# Patient Record
Sex: Male | Born: 1950 | Race: White | Hispanic: No | Marital: Married
Health system: Southern US, Community
[De-identification: ages and names within clinical notes are randomized; demographics above are authoritative.]

## PROBLEM LIST (undated history)

## (undated) DIAGNOSIS — N189 Chronic kidney disease, unspecified: Secondary | ICD-10-CM

## (undated) DIAGNOSIS — R578 Other shock: Secondary | ICD-10-CM

## (undated) DIAGNOSIS — E039 Hypothyroidism, unspecified: Secondary | ICD-10-CM

## (undated) DIAGNOSIS — E669 Obesity, unspecified: Secondary | ICD-10-CM

## (undated) DIAGNOSIS — Z973 Presence of spectacles and contact lenses: Secondary | ICD-10-CM

## (undated) DIAGNOSIS — I4891 Unspecified atrial fibrillation: Secondary | ICD-10-CM

## (undated) DIAGNOSIS — R002 Palpitations: Secondary | ICD-10-CM

## (undated) DIAGNOSIS — Z7901 Long term (current) use of anticoagulants: Secondary | ICD-10-CM

## (undated) DIAGNOSIS — R9430 Abnormal result of cardiovascular function study, unspecified: Secondary | ICD-10-CM

## (undated) DIAGNOSIS — R238 Other skin changes: Secondary | ICD-10-CM

## (undated) DIAGNOSIS — Q8740 Marfan's syndrome, unspecified: Secondary | ICD-10-CM

## (undated) DIAGNOSIS — Z9289 Personal history of other medical treatment: Secondary | ICD-10-CM

## (undated) DIAGNOSIS — R55 Syncope and collapse: Secondary | ICD-10-CM

## (undated) DIAGNOSIS — E079 Disorder of thyroid, unspecified: Secondary | ICD-10-CM

## (undated) DIAGNOSIS — E119 Type 2 diabetes mellitus without complications: Secondary | ICD-10-CM

## (undated) DIAGNOSIS — K839 Disease of biliary tract, unspecified: Secondary | ICD-10-CM

## (undated) DIAGNOSIS — N433 Hydrocele, unspecified: Secondary | ICD-10-CM

## (undated) DIAGNOSIS — K469 Unspecified abdominal hernia without obstruction or gangrene: Secondary | ICD-10-CM

## (undated) DIAGNOSIS — I499 Cardiac arrhythmia, unspecified: Secondary | ICD-10-CM

## (undated) DIAGNOSIS — R233 Spontaneous ecchymoses: Secondary | ICD-10-CM

## (undated) DIAGNOSIS — R011 Cardiac murmur, unspecified: Secondary | ICD-10-CM

## (undated) DIAGNOSIS — T148XXA Other injury of unspecified body region, initial encounter: Secondary | ICD-10-CM

## (undated) HISTORY — PX: HX TONSILLECTOMY: SHX27

## (undated) HISTORY — PX: HX WISDOM TEETH EXTRACTION: SHX21

## (undated) HISTORY — PX: COLONOSCOPY: WVUENDOPRO10

## (undated) HISTORY — PX: HX HEART CATHETERIZATION: SHX148

## (undated) HISTORY — PX: HX COLONOSCOPY: 2100001147

## (undated) HISTORY — PX: HX TONSIL AND ADENOIDECTOMY: SHX28

---

## 1997-02-09 HISTORY — PX: HX HEART VALVE SURGERY: SHX40

## 1997-07-11 ENCOUNTER — Ambulatory Visit (HOSPITAL_COMMUNITY): Admission: RE | Admit: 1997-07-11 | Discharge: 1997-07-11 | Payer: Self-pay | Admitting: Interventional Cardiology

## 1999-10-31 ENCOUNTER — Encounter: Admission: RE | Admit: 1999-10-31 | Discharge: 1999-10-31 | Payer: Self-pay | Admitting: Cardiothoracic Surgery

## 1999-10-31 ENCOUNTER — Encounter: Payer: Self-pay | Admitting: Cardiothoracic Surgery

## 2004-06-26 ENCOUNTER — Ambulatory Visit: Payer: Self-pay | Admitting: Obstetrics and Gynecology

## 2004-11-19 ENCOUNTER — Ambulatory Visit: Payer: Self-pay

## 2004-12-08 ENCOUNTER — Ambulatory Visit: Payer: Self-pay

## 2006-09-09 ENCOUNTER — Ambulatory Visit: Payer: Self-pay

## 2006-09-20 ENCOUNTER — Ambulatory Visit: Payer: Self-pay

## 2006-11-05 ENCOUNTER — Ambulatory Visit: Payer: Self-pay

## 2006-12-02 ENCOUNTER — Ambulatory Visit: Payer: Self-pay

## 2007-01-17 ENCOUNTER — Ambulatory Visit: Payer: Self-pay

## 2007-04-21 ENCOUNTER — Ambulatory Visit: Payer: Self-pay

## 2007-06-17 ENCOUNTER — Ambulatory Visit: Payer: Self-pay

## 2007-06-21 ENCOUNTER — Ambulatory Visit: Payer: Self-pay

## 2007-08-24 ENCOUNTER — Ambulatory Visit: Payer: Self-pay

## 2007-09-06 ENCOUNTER — Ambulatory Visit: Payer: Self-pay

## 2007-09-16 ENCOUNTER — Ambulatory Visit

## 2007-09-27 ENCOUNTER — Ambulatory Visit: Payer: Self-pay

## 2007-09-29 ENCOUNTER — Ambulatory Visit: Payer: Self-pay

## 2007-10-27 ENCOUNTER — Ambulatory Visit: Payer: Self-pay

## 2007-11-24 ENCOUNTER — Ambulatory Visit: Payer: Self-pay

## 2007-12-06 ENCOUNTER — Ambulatory Visit: Payer: Self-pay

## 2007-12-29 ENCOUNTER — Ambulatory Visit: Payer: Self-pay

## 2008-05-03 ENCOUNTER — Ambulatory Visit: Admission: RE | Admit: 2008-05-03 | Payer: Self-pay | Source: Ambulatory Visit | Admitting: EXTERNAL

## 2008-05-22 ENCOUNTER — Ambulatory Visit: Admission: RE | Admit: 2008-05-22 | Payer: Self-pay | Source: Ambulatory Visit | Admitting: EXTERNAL

## 2008-07-11 ENCOUNTER — Ambulatory Visit: Admission: RE | Admit: 2008-07-11 | Payer: Self-pay | Source: Ambulatory Visit | Admitting: EXTERNAL

## 2008-08-28 ENCOUNTER — Ambulatory Visit: Admission: RE | Admit: 2008-08-28 | Payer: Self-pay | Source: Ambulatory Visit

## 2008-10-31 ENCOUNTER — Ambulatory Visit: Admission: RE | Admit: 2008-10-31 | Payer: Self-pay | Source: Ambulatory Visit

## 2008-11-29 ENCOUNTER — Encounter (INDEPENDENT_AMBULATORY_CARE_PROVIDER_SITE_OTHER): Payer: Self-pay | Admitting: "Endocrinology

## 2008-12-18 ENCOUNTER — Encounter (INDEPENDENT_AMBULATORY_CARE_PROVIDER_SITE_OTHER): Payer: No Typology Code available for payment source | Admitting: "Endocrinology

## 2008-12-31 ENCOUNTER — Encounter (INDEPENDENT_AMBULATORY_CARE_PROVIDER_SITE_OTHER): Payer: Self-pay

## 2009-01-07 ENCOUNTER — Encounter (INDEPENDENT_AMBULATORY_CARE_PROVIDER_SITE_OTHER): Payer: Self-pay

## 2009-03-20 ENCOUNTER — Encounter (INDEPENDENT_AMBULATORY_CARE_PROVIDER_SITE_OTHER): Payer: No Typology Code available for payment source

## 2009-03-25 ENCOUNTER — Ambulatory Visit: Admission: RE | Admit: 2009-03-25 | Payer: Self-pay | Source: Ambulatory Visit

## 2009-08-27 ENCOUNTER — Ambulatory Visit
Admission: RE | Admit: 2009-08-27 | Disposition: A | Discharge status: Home / Self Care | Payer: Self-pay | Source: Ambulatory Visit

## 2010-03-02 ENCOUNTER — Encounter: Payer: Self-pay | Admitting: Endocrinology

## 2010-07-15 ENCOUNTER — Ambulatory Visit: Admission: RE | Admit: 2010-07-15 | Discharge: 2010-07-15 | Payer: Self-pay | Source: Ambulatory Visit

## 2010-07-29 ENCOUNTER — Ambulatory Visit (INDEPENDENT_AMBULATORY_CARE_PROVIDER_SITE_OTHER): Payer: No Typology Code available for payment source | Admitting: UHP UROLOGY

## 2010-07-29 ENCOUNTER — Encounter (INDEPENDENT_AMBULATORY_CARE_PROVIDER_SITE_OTHER): Payer: Self-pay | Admitting: UHP UROLOGY

## 2010-07-29 VITALS — BP 109/67 | HR 80 | Temp 98.1°F | Resp 18 | Wt 285.0 lb

## 2010-07-29 NOTE — Progress Notes (Signed)
 Subjective:     Patient ID:  Kenneth Weeks is an 60 y.o. male     Chief Complaint:    Chief Complaint   Patient presents with   . Groin Pain     HPI  Mr. Kenneth Weeks is a very pleasant 61 year old Caucasian male who was referred by Dr. Dietrich for consultation regarding a hydrocele. The patient has noticed right-sided scrotal swelling for about one year. He denies any pain from this though does describe some discomfort associated with certain positions and movement. The patient did undergo an ultrasound of the scrotum which did not reveal any testicular tumors.    The patient does note a change in the size with some decrease early in the morning. He does take a diuretic for lower extremity edema. He denies any prior history of hernia repair. He denies this being associated with heavy lifting.    There is no family history of testicular cancer. The patient denies any significant urinary symptoms except for some nocturia.  He comes in today to discuss the possible management of this.    Past Medical History:  Marfan's syndrome  Hypothyroidism  Atrial fibrillation  Diabetes  Edema  Benign prostatic hyperplasia    Past Surgical History:  Heart surgery for valve replacement    Allergies:  No known drug allergies    Home Medications:  Current Outpatient Prescriptions   Medication Sig   . furosemide  (LASIX ) 40 mg Oral Tablet take 60 mg by mouth Twice daily.     SABRA warfarin (COUMADIN ) 7.5 mg Oral Tablet take 7.5 mg by mouth Once a day.     . Benazepril (LOTENSIN) 40 mg Oral Tablet take 40 mg by mouth Once a day.     . levothyroxine  (SYNTHROID ) 200 mcg Oral Tablet take 200 mcg by mouth Once a day.     . doxazosin (CARDURA) 8 mg Oral Tablet take 8 mg by mouth Once a day.     . metoprolol  succinate (TOPROL -XL) 100 mg Oral Tablet Sustained Release 24 hr take 150 mg by mouth Once a day.     . Metformin (GLUCOPHAGE) 1,000 mg Oral Tablet take 1,000 mg by mouth Twice daily with food.       Social History:  Denies tobacco  use.  Positive for social alcohol use.  Denies drug use.    Family History:  No prostate cancer  No kidney cancer  No bladder cancer  No testicular cancer    Review of Systems   Constitutional: Negative for fever, chills, weight loss and malaise/fatigue.   HENT: Negative for ear pain, sore throat and neck pain.    Eyes: Negative for blurred vision, double vision and pain.   Respiratory: Negative for cough, shortness of breath and wheezing.    Cardiovascular: Positive for palpitations. Negative for chest pain.   Gastrointestinal: Negative for nausea, vomiting, abdominal pain, diarrhea, constipation and blood in stool.   Genitourinary: Negative for dysuria, urgency, frequency, hematuria and flank pain.        Nocturia.   Musculoskeletal: Positive for back pain. Negative for myalgias and joint pain.   Skin: Negative for itching and rash.   Neurological: Negative for dizziness, tingling, tremors, weakness and headaches.   Endo/Heme/Allergies: Positive for polydipsia. Negative for environmental allergies. Does not bruise/bleed easily.   Psychiatric/Behavioral: Negative for depression. The patient is not nervous/anxious.      Objective:   Blood pressure 109/67, pulse 80, temperature 36.7 C (98.1 F), temperature source Tympanic, resp. rate 18,  weight 129.275 kg (285 lb).  Physical Exam   Constitutional: He is well-developed, well-nourished, and in no distress. No distress.   HENT:   Head: Normocephalic and atraumatic.   Eyes: Conjunctivae and EOM are normal. No scleral icterus.   Neck: Normal range of motion. No tracheal deviation present.   Cardiovascular: Normal rate.    Pulmonary/Chest: Effort normal. No respiratory distress.   Abdominal: Soft. He exhibits no distension. There is no tenderness.   Genitourinary: Penis normal.        Phallus normal. Normal meatus.  Nontender.  No plaques.  Left testicle descended. No masses.  Unable to assess right testicle secondary to large hydrocele.    Scrotal skin normal.      Musculoskeletal: Normal range of motion. He exhibits edema.   Neurological: He is alert. Gait normal.   Skin: Skin is warm. He is not diaphoretic. No pallor.   Psychiatric: Mood and affect normal.     Ortho/Musculoskeletal:   Normal range of motion. He exhibits edema.     TESTICULAR ULTRASOUND:  07/15/10:  Both testicles are visualized.  The right side measures 4.9 x 2.4 x 2.6 cm.  The left side measures 4.2 x 2.6 x 2.5 cm.  There is a prominent hydrocele on the right side measuring 8 x 7.2 x 5 cm.  A moderate sized hydrocele noted on the left side measuring 4.5 x 2.4 x 1.6 cm.  There is an epididymal head cyst measuring 1.2 cm noted on the left side.  Good color flow noted within the testicular parenchyma bilaterally.  No evidence of a focal mass.    IMPRESSION:    1. Moderate amount of bilateral hydrocele with the right side appearing more prominent than the left as discussed above.   2.  1.2 cm epididymal head cyst.   3.  No evidence of a testicular mass or torsion.      Assessment & Plan:     603.9 Hydrocele    I reviewed the testicular ultrasound with Kenneth Weeks that demonstrates right hydrocele. I discussed that active surveillance is an option if he is not particularly bothered with this. I discussed with him that definitive treatment would involve a hydrocelectomy through a scrotal incision. I reviewed with him the details of this procedure and discussed with him that this would be an outpatient procedure. The risks would be bleeding, infection, hematoma, and recurrence. I told him that there would be swelling after the procedure which may take upwards of 1 to 2 months to resolve.   I discussed with the patient that aspiration is not typically done for hydroceles. This would potentially cause infection and a sterile area and this was likely recur.  All of his questions were answered. He will let me know what his preference will be.  He will speak with his wife and let us  know if he desires surgery. I gave  him printed information about hydroceles and also gave him a copy of the ultrasound report. All of his questions were answered today.

## 2012-07-01 NOTE — Procedures (Signed)
[facility]                               CARDIOVASCULAR LABORATORY       TEE guided Cardioversion              NAME: Brian Young, Brian Young                                  ROOM#: O/--CP              DOB:  December 25, 1950                                            AGE/SEX: 59/M       ZO#:[id number]       [id number]       _________________________________________________________________________              TEST DATE: 08-27-2009                      REQ PHYS: Rushton Early Laverle Patter, MD       ORDER#:                                    INT PHYS: Dezaree Tracey A Abbe Bula, MD       HGT: 76 inches                                         WEIGHT: 288 pounds       TECH: RT                                                      BP: 151/104       _________________________________________________________________________              TIME OF TEST:       09:59 AM              INDICATION:       Atrial fibrillation.              INTERPRETATION:       Presenting rhythm was atrial fibrillation with a controlled ventricular       response rate.              The patient's prothrombin times had been generally therapeutic with a       single subtherapeutic INR and hence the decision to proceed with TEE       guidance was undertaken.              PROCEDURE:       Posterior oropharyngeal anesthesia was accomplished using several sprays       of Cetacaine topical anesthetic and viscus Lidocaine.  Thereafter the       patient was placed in the left lateral decubitus position and conscious       sedation was provided.  A total of 3.5 mg of  Versed and 50 mcg of       Fentanyl was administered intravenously.  The TEE probe was placed into       the posterior pharynx and directed downward.  At the level of the       cricopharyngeus the patient was directed to swallow and the probe passed       easily into the esophagus.  A complete TEE in the mid esophageal and       transgastric views was undertaken.               FINDINGS:       1.  Left ventricle:  Normal in chamber size and low normal to           mildly depressed in systolic function.  The estimated ejection           fraction is 45-50%.       2.  Right ventricle:  Normal in size and systolic function.       3.  Left atrium:  The left atrium is mildly enlarged.  There was           no evidence for organized thrombus seen in either the left atrial           main body or the left atrial appendage.       4.  Right atrium:  Grossly normal.       5.  Interatrial septum:  The interatrial septum is mobile but does           not demonstrate aneurysm.  There is no evidence for PFO by color flow           doppler or 2-dimensional imaging.       6.  Aortic valve:  There is a mechanical prosthesis in the aortic           position with appears well seated.  There is no aortic insufficiency.           Valve leaflet mobility appears to be normal.       7.  Mitral valve:  The mitral valve leaflets are grossly normal.           There is 1-2+ central mitral regurgitation by color flow doppler.              8.  Tricuspid valve: Structurally normal with 1+ tricuspid           regurgitation.       9.  Pulmonic valve:  Poorly seen.                                Following completion of the TEE, a single synchronized biphasic counter           shock atoner joules is delivered with restoration of sinus rhythm.  No           obvious complications were noted.                            **PRELIMINARY REPORT UNLESS SIGNED**       SEE DOCUMENT IMAGING SYSTEM FOR FINAL REPORT                                 ________________________________  Miley Blanchett Laverle Patter, MD                                            ID:   [id number]       DocType:   [id number]       \:   AR       /:   654       DD:  08/27/2009 10:20:39       DT:  08/27/2009 17:34:02       JOB: [id number]       CC:  Chriss Czar, DO [phone])       >  Authenticated by Pola Corn., MD On 09/01/2009 01:46:23 PM

## 2013-03-17 LAB — ECG 12-LEAD
P Wave Axis: 67 deg
P Wave Duration: 154 ms
P-R Interval: 178 ms
Patient Age: 59 years
Q-T Dispersion: 46 ms
Q-T Interval(Corrected): 425 ms
Q-T Interval: 404 ms
QRS Axis: 19 deg
QRS Duration: 90 ms
T Axis: 36 deg
Ventricular Rate: 72 /min

## 2013-09-26 ENCOUNTER — Ambulatory Visit (INDEPENDENT_AMBULATORY_CARE_PROVIDER_SITE_OTHER): Payer: No Typology Code available for payment source | Admitting: Urology

## 2013-10-10 ENCOUNTER — Encounter (INDEPENDENT_AMBULATORY_CARE_PROVIDER_SITE_OTHER): Payer: Self-pay | Admitting: Urology

## 2013-10-10 ENCOUNTER — Ambulatory Visit (INDEPENDENT_AMBULATORY_CARE_PROVIDER_SITE_OTHER): Payer: Medicare Other | Admitting: Urology

## 2013-10-10 VITALS — BP 150/99 | HR 97 | Temp 97.9°F | Ht 76.0 in | Wt 311.8 lb

## 2013-10-10 DIAGNOSIS — N432 Other hydrocele: Principal | ICD-10-CM

## 2013-10-11 NOTE — Progress Notes (Signed)
[facility]                                                                              PROGRESS NOTE    PATIENT NAME: Kenneth Weeks, Kenneth Weeks [facility] VOZDGU:[id number]  DATE OF SERVICE:10/10/2013  DATE OF BIRTH: 20-Oct-1950    Kenneth Weeks is a 63 year old male presenting for evaluation of hydrocele.    HISTORY OF PRESENT ILLNESS:  The patient has a history of Marfan syndrome and associated aortic valvular disease.  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  He saw Dr. Bayard Hugger a few years ago for bothersome hydrocele.  He elected to observe it, but he continues to have increasing troubles in association with this.  He wants to have something done about this and as surgery is prohibitively risky because of his need for anticoagulation he is requesting that he have hydrocele aspiration performed.    PAST MEDICAL HISTORY:  As above.    PAST SURGICAL HISTORY:  Aortic valve replacement in 1999.    MEDICATIONS:  Warfarin, benazepril, Synthroid and Lasix.    SOCIAL HISTORY:  Negative for tobacco or drug use.    REVIEW OF SYSTEMS:  Positive for heart palpitations and excessive thirst.  Otherwise, 12-point review of systems negative.    PHYSICAL EXAMINATION:  The patient has a very large right-sided scrotal hydrocele.  No hernias appreciated.  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    ASSESSMENT AND PLAN:  Patient with very symptomatic and enlarging hydrocele.  Normally I do not recommend simple aspiration because of risk of recurrence, bleeding, infection and need for additional treatment.  I think it is a reasonable approach for this particular patient, however, as he cannot come off Coumadin and therefore, it would be prohibitively risky for bleeding complications with formal hydrocelectomy.   The patient wants to think all these things over and I clearly explained that in general, this is not a standard approach to deal with hydroceles.  If he decides to pursue hydrocele aspiration we will see him back in the near future to have this done in the office.      Kathline Magic, MD      [id number]; D: 10/10/2013 14:53:07 T: 10/11/2013 08:19:45

## 2013-10-12 ENCOUNTER — Ambulatory Visit
Admission: RE | Admit: 2013-10-12 | Discharge: 2013-10-12 | Disposition: A | Discharge status: Home / Self Care | Payer: Medicare Other | Source: Ambulatory Visit | Attending: INTERNAL MEDICINE | Admitting: INTERNAL MEDICINE

## 2013-10-12 DIAGNOSIS — Z7901 Long term (current) use of anticoagulants: Secondary | ICD-10-CM | POA: Insufficient documentation

## 2013-10-12 LAB — PROTHROMBIN TIME, THERAPEUTIC
INR: 5.95
PROTHROMBIN TIME: 66.7 s (ref 9.8–11.0)

## 2013-10-17 ENCOUNTER — Ambulatory Visit
Admission: RE | Admit: 2013-10-17 | Discharge: 2013-10-17 | Disposition: A | Discharge status: Home / Self Care | Payer: Medicare Other | Source: Ambulatory Visit | Attending: INTERNAL MEDICINE | Admitting: INTERNAL MEDICINE

## 2013-10-17 DIAGNOSIS — I749 Embolism and thrombosis of unspecified artery: Secondary | ICD-10-CM | POA: Insufficient documentation

## 2013-10-17 LAB — PROTHROMBIN TIME, THERAPEUTIC
INR: 2.47
PROTHROMBIN TIME: 26.7 s (ref 9.8–11.0)

## 2013-10-18 ENCOUNTER — Ambulatory Visit (INDEPENDENT_AMBULATORY_CARE_PROVIDER_SITE_OTHER): Payer: Medicare Other | Admitting: Urology

## 2013-10-18 ENCOUNTER — Encounter (INDEPENDENT_AMBULATORY_CARE_PROVIDER_SITE_OTHER): Payer: Self-pay | Admitting: Urology

## 2013-10-18 VITALS — BP 140/90 | HR 80 | Temp 97.6°F | Ht 75.5 in | Wt 308.2 lb

## 2013-10-18 DIAGNOSIS — N433 Hydrocele, unspecified: Principal | ICD-10-CM

## 2013-10-19 NOTE — Procedures (Signed)
[facility]                                          PROCEDURE NOTE    PATIENT NAME: Kenneth Weeks, Kenneth Weeks QBVQXI:[id number]  DATE OF SERVICE:10/18/2013  DATE OF BIRTH: 05/11/1950    NAME OF PROCEDURE:  Aspiration of hydrocele.     PREOPERATIVE DIAGNOSIS:  Symptomatic hydrocele   POSTOPERATIVE DIAGNOSIS:  same    INDICATIONS FOR PROCEDURE:  The patient has a history of a St. Jude mechanical aortic valve which requires mandatory anticoagulation for Coumadin.  He has increasingly bothersome right-sided hydrocele and presents today to have it aspirated.    DESCRIPTION OF PROCEDURE:  Informed consent was obtained.  The patient was taken to the procedure room and placed supine on the procedure room table, prepped and draped in standard sterile manner.  Local anesthesia with 1% lidocaine was administered overlying the right scrotal skin down towards the hydrocele sac.  Once this was done, an 18-gauge needle was inserted through the scrotal skin and into the hydrocele sac and approximately 200 mL of straw-colored fluid was aspirated out of the hydrocele sac.    The patient was informed prior to the procedure that there is a significant chance for recurrence.  He was also aware of the risks of bleeding or infection.  We will see him back in a few weeks to monitor his response to this.  We did also discuss that if he has a good response to this, we could consider aspiration along with injection of sclerosing agents in the future.      Kathline Magic, MD      LK/JZ/[id number]; D: 10/18/2013 16:35:56 T: 10/19/2013 11:17:33

## 2013-10-26 NOTE — Progress Notes (Signed)
[facility]  [address] [place]  [place], [place] [address]  Phone [phone]  Fax [phone]     CC/HPI: See dictated note.    Greater than 50% of a 30 minute visit was spent in discussion of prognosis and treatment options.    Assessment/Plan:    ICD-9-CM   1. Other hydrocele 603.8       No orders of the defined types were placed in this encounter.         Past Medical History:  No past medical history on file.  No past medical history pertinent negatives on file.            Past Surgical History:  No past surgical history on file.  No past surgical history pertinent negatives on file.        Allergies:  No Known Allergies    Current Medications:  Current Outpatient Prescriptions   Medication Sig    Benazepril (LOTENSIN) 40 mg Oral Tablet take 40 mg by mouth Once a day.      furosemide (LASIX) 40 mg Oral Tablet take 60 mg by mouth Twice daily.      levothyroxine (SYNTHROID) 200 mcg Oral Tablet take 200 mcg by mouth Once a day.      warfarin (COUMADIN) 7.5 mg Oral Tablet Take 7.5 mg by mouth Once a day 7.5 mg Monday- Friday and 10 mg Saturday and Sunday       Social History:  History   Substance Use Topics    Smoking status: Never Smoker     Smokeless tobacco: Never Used    Alcohol Use: No       Family History:  No family history on file.        Physical examination:  BP 150/99 mmHg   Pulse 97   Temp(Src) 36.6 C (97.9 F) (Tympanic)   Ht 1.93 m (6\' 4" )   Wt 141.432 kg (311 lb 12.8 oz)   BMI 37.97 kg/m2  See dictated note    Labs:  Pertinent labs reviewed.    Imaging studies:  Pertinent imaging studies reviewed.        Marthe Patch, MD 10/26/2013, 13:12

## 2013-10-27 ENCOUNTER — Other Ambulatory Visit (INDEPENDENT_AMBULATORY_CARE_PROVIDER_SITE_OTHER): Payer: Self-pay | Admitting: INTERNAL MEDICINE

## 2013-11-01 ENCOUNTER — Encounter (INDEPENDENT_AMBULATORY_CARE_PROVIDER_SITE_OTHER): Payer: Medicare Other | Admitting: Urology

## 2013-11-01 ENCOUNTER — Other Ambulatory Visit: Payer: Medicare Other

## 2013-11-08 ENCOUNTER — Ambulatory Visit
Admission: RE | Admit: 2013-11-08 | Discharge: 2013-11-08 | Disposition: A | Discharge status: Home / Self Care | Payer: Medicare Other | Source: Ambulatory Visit | Attending: INTERNAL MEDICINE | Admitting: INTERNAL MEDICINE

## 2013-11-08 DIAGNOSIS — Z7901 Long term (current) use of anticoagulants: Secondary | ICD-10-CM | POA: Insufficient documentation

## 2013-11-08 LAB — PROTHROMBIN TIME, THERAPEUTIC
INR: 3.88
INR: 3.88
PROTHROMBIN TIME: 43.3 s — ABNORMAL HIGH (ref 9.4–12.5)

## 2013-11-13 ENCOUNTER — Telehealth (INDEPENDENT_AMBULATORY_CARE_PROVIDER_SITE_OTHER): Payer: Self-pay

## 2013-11-13 MED ORDER — FUROSEMIDE 20 MG TABLET
60.0000 mg | ORAL_TABLET | Freq: Two times a day (BID) | ORAL | Status: DC
Start: 2013-11-13 — End: 2013-11-13

## 2013-11-13 MED ORDER — FUROSEMIDE 20 MG TABLET
60.0000 mg | ORAL_TABLET | Freq: Two times a day (BID) | ORAL | Status: DC
Start: 2013-11-13 — End: 2014-05-03

## 2013-11-13 NOTE — Telephone Encounter (Signed)
This has been done for #180 doses     Sorry for the hassle     --Thurmond Butts

## 2013-11-13 NOTE — Telephone Encounter (Signed)
I'm sorry but pt/pharmacy called back again requesting #180 not #90 on Rx, can you please resend again?

## 2013-11-13 NOTE — Addendum Note (Signed)
Addended by: Shane Crutch on: 11/13/2013 04:48 PM     Modules accepted: Orders

## 2013-11-13 NOTE — Telephone Encounter (Signed)
Pt. Called about an error with his medication; he takes furoseimide 20 mg. 6 pills q.d.  The Rx phoned into [facility] was for #90, one q.d.  He did not take the medication.  Could we please call with the correct dosage.

## 2013-11-13 NOTE — Telephone Encounter (Signed)
This had been addressed and proper Rx has been sent to [facility]     Thanks.   Sorry for the hassle     Thurmond Butts

## 2013-12-01 ENCOUNTER — Telehealth (INDEPENDENT_AMBULATORY_CARE_PROVIDER_SITE_OTHER): Payer: Self-pay | Admitting: INTERNAL MEDICINE

## 2013-12-01 DIAGNOSIS — Z7901 Long term (current) use of anticoagulants: Secondary | ICD-10-CM

## 2013-12-01 NOTE — Telephone Encounter (Signed)
Patient has [facility] Wed 12/06/13. Please send order.  Thanks  [employer]  12/01/2013, 15:11

## 2013-12-06 ENCOUNTER — Other Ambulatory Visit: Payer: Medicare Other

## 2013-12-12 ENCOUNTER — Other Ambulatory Visit: Payer: Medicare Other

## 2013-12-21 ENCOUNTER — Ambulatory Visit
Admission: RE | Admit: 2013-12-21 | Discharge: 2013-12-21 | Disposition: A | Discharge status: Home / Self Care | Payer: Medicare Other | Source: Ambulatory Visit | Attending: INTERNAL MEDICINE | Admitting: INTERNAL MEDICINE

## 2013-12-21 ENCOUNTER — Ambulatory Visit (INDEPENDENT_AMBULATORY_CARE_PROVIDER_SITE_OTHER): Payer: Self-pay | Admitting: INTERNAL MEDICINE

## 2013-12-21 DIAGNOSIS — Z5181 Encounter for therapeutic drug level monitoring: Secondary | ICD-10-CM | POA: Insufficient documentation

## 2013-12-21 DIAGNOSIS — Z7901 Long term (current) use of anticoagulants: Secondary | ICD-10-CM | POA: Insufficient documentation

## 2013-12-21 LAB — PT/INR
INR: 5.32
PROTHROMBIN TIME: 59.5 s (ref 9.4–12.5)

## 2013-12-21 NOTE — Progress Notes (Signed)
Patient notified of lab results.   Lambert Mody, MA  12/21/2013, 14:30

## 2013-12-21 NOTE — Progress Notes (Signed)
12/21/13 1300   [facility]/INR   Date INR drawn 12/21/13   INR 5.3   New weekly dosgae Hold warfarin today and then resume normal dose after  that   Comments Check INR in one month   Anticoag Indication Other (see below)  (Mechanical heart valve)   Goal INR 2.5-3.5

## 2014-01-02 ENCOUNTER — Telehealth (INDEPENDENT_AMBULATORY_CARE_PROVIDER_SITE_OTHER): Payer: Self-pay

## 2014-01-02 DIAGNOSIS — Z7901 Long term (current) use of anticoagulants: Secondary | ICD-10-CM

## 2014-01-02 NOTE — Telephone Encounter (Signed)
Patient is down for labs (doesn't say what) in Dec. Please send order.  Thanks  [employer]  01/02/2014, 16:50

## 2014-01-17 ENCOUNTER — Ambulatory Visit (INDEPENDENT_AMBULATORY_CARE_PROVIDER_SITE_OTHER): Payer: Self-pay | Admitting: INTERNAL MEDICINE

## 2014-01-17 ENCOUNTER — Ambulatory Visit
Admission: RE | Admit: 2014-01-17 | Discharge: 2014-01-17 | Disposition: A | Discharge status: Home / Self Care | Payer: Medicare Other | Source: Ambulatory Visit | Attending: INTERNAL MEDICINE | Admitting: INTERNAL MEDICINE

## 2014-01-17 ENCOUNTER — Telehealth (INDEPENDENT_AMBULATORY_CARE_PROVIDER_SITE_OTHER): Payer: Self-pay | Admitting: INTERNAL MEDICINE

## 2014-01-17 DIAGNOSIS — Z7901 Long term (current) use of anticoagulants: Secondary | ICD-10-CM | POA: Insufficient documentation

## 2014-01-17 DIAGNOSIS — Z5181 Encounter for therapeutic drug level monitoring: Secondary | ICD-10-CM | POA: Insufficient documentation

## 2014-01-17 LAB — PT/INR
INR: 6.26
PROTHROMBIN TIME: 70.1 s (ref 9.4–12.5)

## 2014-01-17 NOTE — Progress Notes (Signed)
Pt aware of results. States understanding.  [place], [place]  01/17/2014, 15:58

## 2014-01-17 NOTE — Progress Notes (Signed)
01/17/14 1300   [facility]/INR   Date INR drawn 01/17/14   INR 6.3   Comments Hold the warfarin for two days and then resume. Please normal. Check in 2 weeks   Initials RTM   Anticoag Indication (Mechanical Aortic valve)   Goal INR 2.5-3.5

## 2014-01-17 NOTE — Telephone Encounter (Signed)
[facility] with [facility].   PT: 70.1  INR: 6.26

## 2014-02-15 ENCOUNTER — Ambulatory Visit
Admission: RE | Admit: 2014-02-15 | Discharge: 2014-02-15 | Disposition: A | Discharge status: Home / Self Care | Payer: Medicare Other | Source: Ambulatory Visit | Attending: INTERNAL MEDICINE | Admitting: INTERNAL MEDICINE

## 2014-02-15 ENCOUNTER — Ambulatory Visit (INDEPENDENT_AMBULATORY_CARE_PROVIDER_SITE_OTHER): Payer: Self-pay | Admitting: INTERNAL MEDICINE

## 2014-02-15 DIAGNOSIS — Z7901 Long term (current) use of anticoagulants: Secondary | ICD-10-CM | POA: Insufficient documentation

## 2014-02-15 LAB — PT/INR
INR: 4.64
PROTHROMBIN TIME: 51.8 s (ref 9.4–12.5)

## 2014-02-15 NOTE — Progress Notes (Signed)
Patient notified of lab results.   Lambert Mody, MA  02/15/2014, 14:55

## 2014-02-15 NOTE — Progress Notes (Signed)
02/15/14 1300   [facility]/INR   Date INR drawn 02/15/14   INR 4.6   Comments Just a touch high. Hold one day and then resume normal. Check in one month   Initials RTM   Anticoag Indication (Mechanical heart valve)   Goal INR 2.5-3.5

## 2014-02-27 NOTE — Procedures (Signed)
See dictated note.

## 2014-03-14 ENCOUNTER — Ambulatory Visit
Admission: RE | Admit: 2014-03-14 | Discharge: 2014-03-14 | Disposition: A | Discharge status: Home / Self Care | Payer: Medicare Other | Source: Ambulatory Visit | Attending: INTERNAL MEDICINE | Admitting: INTERNAL MEDICINE

## 2014-03-14 ENCOUNTER — Ambulatory Visit (INDEPENDENT_AMBULATORY_CARE_PROVIDER_SITE_OTHER): Payer: Self-pay | Admitting: INTERNAL MEDICINE

## 2014-03-14 DIAGNOSIS — Z7901 Long term (current) use of anticoagulants: Secondary | ICD-10-CM | POA: Insufficient documentation

## 2014-03-14 LAB — PT/INR
INR: 4.76
PROTHROMBIN TIME: 52.7 s — ABNORMAL HIGH (ref 9.4–12.5)

## 2014-03-14 NOTE — Progress Notes (Signed)
03/14/14 1200   [facility]/INR   Date INR drawn 03/14/14   INR 4.6   Comments Hold warfarin for a day, then resume normal   Initials RTM   Anticoag Indication (Heart valve)   Goal INR 2.5-3.5

## 2014-03-15 ENCOUNTER — Other Ambulatory Visit: Payer: Medicare Other

## 2014-03-30 ENCOUNTER — Encounter (INDEPENDENT_AMBULATORY_CARE_PROVIDER_SITE_OTHER): Payer: Self-pay | Admitting: Urology

## 2014-03-30 ENCOUNTER — Ambulatory Visit (INDEPENDENT_AMBULATORY_CARE_PROVIDER_SITE_OTHER): Payer: Medicare Other | Admitting: Urology

## 2014-03-30 VITALS — BP 131/84 | HR 93 | Temp 96.8°F | Ht 75.0 in | Wt 305.2 lb

## 2014-03-30 DIAGNOSIS — N433 Hydrocele, unspecified: Secondary | ICD-10-CM

## 2014-03-31 NOTE — Progress Notes (Signed)
[facility]                                                                              PROGRESS NOTE    PATIENT NAME: Kenneth Weeks, Kenneth Weeks  HOSPITAL NUMBER:[id number]  DATE OF SERVICE:03/30/2014  DATE OF BIRTH: Oct 11, 1950    This patient is a 64 year old male presenting for followup of hydrocele.    SUBJECTIVE:  The patient is a 64 year old male with a history of Marfan syndrome and aortic valve disease.  He had a St. Jude mechanical valve placed and requires lifelong anticoagulation for this.  He has a bothersome hydrocele that interferes with his ability to urinate.  A few months ago, I performed an aspiration on it.  He reports that he had a good symptomatic improvement, but it was short lived.  The hydrocele recurred around 1 month later.  The patient understood that this was expected natural history for hydrocele aspirations.  He is presenting today requesting repeat aspiration.  We also discussed some studies suggesting a beneficial role for aspiration followed by injection of sclerosing agents with antibiotics such as tetracycline or doxycycline.  We do not have these antibiotics available today but if the hydrocele continues to recur, we may consider injection of sclerosing agents in the future.    PROCEDURE NOTE:  Informed consent was obtained.  The patient was placed supine on the procedure room table, prepped and draped in the standard sterile manner.  2% lidocaine was injected in the anterior scrotal wall on the right side and then using an 18-gauge needle to puncture the hydrocele sac, I aspirated around 200 mL of straw-colored fluid.  The patient tolerated this well.  Care was taken not to injure the testicle or cord structures.      ASSESSMENT AND PLAN:  We will see him back as needed for ongoing management of his hydrocele.      Kathline Magic, MD      [id number]; D: 03/30/2014 12:38:11 T: 03/31/2014 40:81:44

## 2014-04-02 NOTE — Procedures (Signed)
See dictated note.

## 2014-04-12 ENCOUNTER — Encounter (INDEPENDENT_AMBULATORY_CARE_PROVIDER_SITE_OTHER): Payer: Self-pay | Admitting: INTERNAL MEDICINE

## 2014-04-12 ENCOUNTER — Ambulatory Visit (INDEPENDENT_AMBULATORY_CARE_PROVIDER_SITE_OTHER): Payer: Medicare Other | Admitting: INTERNAL MEDICINE

## 2014-04-12 ENCOUNTER — Ambulatory Visit
Admission: RE | Admit: 2014-04-12 | Discharge: 2014-04-12 | Disposition: A | Discharge status: Home / Self Care | Payer: Medicare Other | Source: Ambulatory Visit | Attending: INTERNAL MEDICINE | Admitting: INTERNAL MEDICINE

## 2014-04-12 VITALS — BP 130/100 | HR 96 | Ht 75.0 in | Wt 311.0 lb

## 2014-04-12 DIAGNOSIS — I4891 Unspecified atrial fibrillation: Secondary | ICD-10-CM | POA: Insufficient documentation

## 2014-04-12 DIAGNOSIS — Z952 Presence of prosthetic heart valve: Secondary | ICD-10-CM

## 2014-04-12 DIAGNOSIS — E039 Hypothyroidism, unspecified: Secondary | ICD-10-CM

## 2014-04-12 DIAGNOSIS — E669 Obesity, unspecified: Secondary | ICD-10-CM

## 2014-04-12 DIAGNOSIS — Z954 Presence of other heart-valve replacement: Secondary | ICD-10-CM

## 2014-04-12 LAB — PT/INR
INR: 3.58
PROTHROMBIN TIME: 39.9 s (ref 9.4–12.5)

## 2014-04-12 LAB — COMPREHENSIVE METABOLIC PROFILE - BMC/JMC ONLY
ALBUMIN/GLOBULIN RATIO: 1
ALBUMIN: 3.6 g/dL (ref 3.2–5.0)
ALKALINE PHOSPHATASE: 56 IU/L (ref 35–120)
ALT (SGPT): 16 IU/L (ref 0–63)
ANION GAP: 6 mmol/L (ref 3–11)
AST (SGOT): 20 IU/L (ref 0–45)
BILIRUBIN, TOTAL: 1.5 mg/dL — ABNORMAL HIGH (ref 0.0–1.3)
BUN: 11 mg/dL (ref 6–22)
CALCIUM: 8.7 mg/dL (ref 8.5–10.5)
CARBON DIOXIDE: 30 mmol/L (ref 22–32)
CHLORIDE: 99 mmol/L — ABNORMAL LOW (ref 101–111)
CREATININE: 1.02 mg/dL (ref 0.72–1.30)
ESTIMATED GLOMERULAR FILTRATION RATE: >60 mL/min (ref >60)
GLUCOSE: 140 mg/dL — ABNORMAL HIGH (ref 70–110)
POTASSIUM: 3.6 mmol/L (ref 3.5–5.0)
SODIUM: 135 mmol/L — ABNORMAL LOW (ref 136–145)
TOTAL PROTEIN: 7 g/dL (ref 6.0–8.0)

## 2014-04-12 LAB — TSH CASCADE,3RD GEN WITH REFLEX TO FT4: TSH CASCADE: 1.234 u[IU]/mL (ref 0.340–5.600)

## 2014-04-12 NOTE — Progress Notes (Signed)
[facility]   [address]   [place], [place] [address]  [phone]  [phone]    Chronic Problems Office Visit     SUBJECTIVE   Kenneth Weeks is a 64 y.o. male here for management of multiple medical issues that include warfarin management, history of CHF and other primary care medical problems.     This is a regular office visit for him.     He has been regularly getting his INR drawn here at the office.     He does not have any new medications to speak of.        Vision -- glasses; no loss of central or peripheral vision     Hearing -- no major changes in hearing     Sources of pain which are new -- same old aches and pains     Sleep quality --     Chewing/swallowing -- no problem chewing or swallowing     Contact with other providers -- none     Medications reviewed by me personally at this time:   Warfarin 7.49m daily    Synthroid 207m daily   Lasix 60 mg BID     Physical activities   He is not short of breath when he walks; no chest pain; no chest tightness; no swelling in hands or feet        Since our last visit together, has experienced the following healthcare interactions:     ER -- no visits   Urgent care visits -- nothing   Accidents with a car -- nothing of the sort   Injuries at home which resulted in injury -- no falls on the snow or the ice; no injuries at home   Medications from other providers -- nothing that is new   Surgeries or other procedures -- nothing of the sort       Changes in family history since we last met -- no new illnesses in family members that could affect       Changes in social history since we last met -- living in the same location       Medications reviewed    Current outpatient prescriptions:     furosemide (LASIX) 40 mg Oral Tablet, take 60 mg by mouth Twice daily.  , Disp: , Rfl:     levothyroxine (SYNTHROID) 200 mcg Oral Tablet, take 200 mcg by mouth Once a day.  , Disp: , Rfl:     warfarin (COUMADIN) 7.5 mg Oral Tablet, Take 7.5  mg by mouth Once a day 7.5 mg Monday- Friday and 10 mg Saturday and Sunday, Disp: , Rfl:       Review of Systems -     Constitutional: negative for fevers,  Eyes: no new blurry or double vision that is new   ENT: no acute hearing loss   Cardiovascular: no new chest pain or palpitations when he is walking on the [place]   Respiratory: no new shortness of breath   GI: no new vomiting or diarrhea   GU: no new dysuria or hematuria   Musculoskeletal: no new aches and pains  Neurological: no new seizure or headaches   Emotional/Pscychiatric: no depression or anxiety  Skin: no suspicious moles which are new; no acute rashes   Endocrine: no new diabetes   Hematologic/Lymphatic: no DVT or bleeding issues from his warfarin       History   Smoking status    Never Smoker  Smokeless tobacco    Never Used       OBJECTIVE:  BP 130/100 mmHg   Pulse 96   Ht 1.905 m ('6\' 3"' )   Wt 141.069 kg (311 lb)   BMI 38.87 kg/m2    Counseling done about the following face-to-face:     Labs reviewed by me   Drug-drug interactions considered and reviewed by me     Physical Exam     BP taken by me in the right arm -- 128/76    General -- alert and oriented   HEENT -- PERRL, EOMI   Heart -- irregular rate  and rhythm   Neck -- Thyroid is normal sized and not tender   Lungs -- clear and no wheezing; no stridor and no pleuritic pain with deep inhalation   Abdomen -- soft and tender; nor guarding; obese   Neuro -- normal gait and tone; strength is 5/5  Skin -- no rashes noted   Psych -- pleasant and smiling   Vascular -- no edema in hands or feet     Labs -- reviewed by me in the EMR       Medications -- reviewed by me; drug-drug interactions considered by me      Counseling provided directly face-to-face about: more exercise on a regular basis       ASSESSMENT AND PLAN:  Warfarin management for mechanical heart valve -- he is reliable with taking his warfarin and is not scheduled to follow up with the cardiologist anytime soon. No evidence of  CHF or angina. He previously was on benazepril but is doing fine without it     Atrial fibrilation -- no evidence of CHF and minimal symptomatic burden with exercise. Just leave things where they are     Hypothyroidism -- on replacement     Screening for Diabetes -- update in 2016     High cholesterol screening -- check at next labs     Obesity -- we talked about this today     Vision -- glasses; no changes     Hearing issues -- not limited     Exercise --  He needs to get more regular exercise     Need for HIV or hepatitis C screening -- no reason to screen     Tobacco abuse screening -- none at all     Alcohol and drug abuse screening -- none at all     Depression/anxiety screening -- he denies     Screening for colorectal cancer -- he is not due for screening     Immunizations -- I recommend a yearly flu shot         DISPOSITION:   Continue current medications   More regular visit       Check labs for lytes and INR today     Follow up with me in 6 months sooner PRN   More regular exercise

## 2014-04-17 ENCOUNTER — Ambulatory Visit (INDEPENDENT_AMBULATORY_CARE_PROVIDER_SITE_OTHER): Payer: Self-pay | Admitting: INTERNAL MEDICINE

## 2014-04-17 NOTE — Progress Notes (Signed)
Pt was call and notified of results.  Pt is currently taking Coumadin  M thru FR and 10 mg on Sat,Sun.

## 2014-04-17 NOTE — Progress Notes (Signed)
Coumadin 7.5mg  M-FR and 10 mg on Sat, Sun

## 2014-04-17 NOTE — Progress Notes (Signed)
04/17/14 1000   [facility] PT/INR   Date INR drawn 04/17/14   INR 3.6   New warfarin dosage Keep the same coumadin schedule and dosage   New weekly dosgae No changes   Comments Check INR in one month   Initials RTM   Anticoag Indication Other (see below)   Anticoag Indication Heart valve, mechanical   Goal INR 2.5 - 3.5

## 2014-04-18 ENCOUNTER — Other Ambulatory Visit: Payer: Self-pay

## 2014-05-03 ENCOUNTER — Other Ambulatory Visit (INDEPENDENT_AMBULATORY_CARE_PROVIDER_SITE_OTHER): Payer: Self-pay | Admitting: INTERNAL MEDICINE

## 2014-05-11 ENCOUNTER — Ambulatory Visit (INDEPENDENT_AMBULATORY_CARE_PROVIDER_SITE_OTHER): Payer: Medicare Other | Admitting: Internal Medicine

## 2014-05-11 ENCOUNTER — Encounter (INDEPENDENT_AMBULATORY_CARE_PROVIDER_SITE_OTHER): Payer: Self-pay | Admitting: Internal Medicine

## 2014-05-11 VITALS — BP 136/92 | HR 94 | Temp 98.0°F | Resp 16 | Ht 75.0 in | Wt 316.0 lb

## 2014-05-11 DIAGNOSIS — J309 Allergic rhinitis, unspecified: Principal | ICD-10-CM

## 2014-05-11 NOTE — Progress Notes (Signed)
[facility]   [address]   [place],  [address]  [phone]  [phone]    Acute Care Visit    ID: Kenneth Weeks   DOB: 06/12/1950  Date of Service: 05/11/2014     Chief Complaint(s):   Chief Complaint   Patient presents with    Cough    Sinus Drainage       SUBJECTIVE:  Sinus congestion, running in the back of throat about 2 wks. Mostly clear drainage.  Has tried OTC antihistamine with temporary help.  Occurs every spring. Every year since moving back from [place] about 10 yrs ago.  No cold symptoms, fevers or chills.  No prior history of asthma.      Past Medical History:  Patient Active Problem List    Diagnosis Date Noted    Hydrocele 07/30/2010       Medications:  Current Outpatient Prescriptions   Medication Sig    furosemide (LASIX) 20 mg Oral Tablet TAKE THREE TABLETS BY MOUTH TWO TIMES A DAY     furosemide (LASIX) 40 mg Oral Tablet take 60 mg by mouth Twice daily.      levothyroxine (SYNTHROID) 200 mcg Oral Tablet take 200 mcg by mouth Once a day.      warfarin (COUMADIN) 7.5 mg Oral Tablet Take 7.5 mg by mouth Once a day 7.5 mg Monday- Friday and 10 mg Saturday and Sunday        Allergies:  No Known Allergies     Social History:  History   Substance Use Topics    Smoking status: Never Smoker     Smokeless tobacco: Never Used    Alcohol Use: No        OBJECTIVE:  BP 136/92 mmHg   Pulse 94   Temp(Src) 36.7 Dmani Mizer (98 F)   Resp 16   Ht 1.905 m (6\' 3" )   Wt 143.337 kg (316 lb)   BMI 39.50 kg/m2   SpO2 98%     Physical Exam:  HENT:Pharynx without injection or exudate.   Neck: no adenopathy  Lungs: Clear to auscultation bilaterally.   Cardiovascular: regular rate and rhythm  S1, S2 normal      ASSESSMENT and PLAN:    ICD-10-CM    1. Allergic rhinitis J30.9        Seasonal allergic rhinitis, try Allegra 180 mg daily and Flonase 2 sprays daily. Advised may take several days before significant improvement.    ROV prn    Glena Norfolk, MD 05/11/2014, 16:46

## 2014-05-15 ENCOUNTER — Encounter (INDEPENDENT_AMBULATORY_CARE_PROVIDER_SITE_OTHER): Payer: Self-pay | Admitting: Urology

## 2014-05-15 ENCOUNTER — Ambulatory Visit (INDEPENDENT_AMBULATORY_CARE_PROVIDER_SITE_OTHER): Payer: Medicare Other | Admitting: Urology

## 2014-05-15 VITALS — BP 124/81 | HR 110 | Temp 97.2°F | Ht 75.0 in | Wt 318.0 lb

## 2014-05-15 DIAGNOSIS — N433 Hydrocele, unspecified: Secondary | ICD-10-CM

## 2014-05-15 DIAGNOSIS — N432 Other hydrocele: Secondary | ICD-10-CM

## 2014-05-15 NOTE — H&P (Signed)
See dictated note.

## 2014-05-15 NOTE — Progress Notes (Signed)
[facility]                                                                              PROGRESS NOTE    PATIENT NAME: Kenneth Weeks, Kenneth Weeks  HOSPITAL NUMBER:[id number]  DATE OF SERVICE:05/15/2014  DATE OF BIRTH: 26-Jul-1950    Chief complaint:  A 64 year old male presenting for followup of hydrocele.    HISTORY OF PRESENT ILLNESS:  The patient has a bothersome large right hydrocele.  The hydrocele was very large and is resulting in slight buried penis and the patient also is having problems with frequent urination.  He attributes this to a combination of the hydrocele as well as his requirement for taking diuretic medications.  He has undergone 2 in-office drainage procedures of the hydrocele, but as expected, he does have recurrence of the hydrocele.  He feels like the way the hydrocele has gone back this current time is somewhat different than the way it was originally.  He is not having severe pain or any evidence of infection.    ASSESSMENT AND PLAN:  The reason we are not performing definitive hydrocelectomy is the patient has a mechanical heart valve and requires lifelong mandatory anticoagulation, and therefore his bleeding risk would be increased.  I did discuss with him that there are sometimes better results with aspiration of the hydrocele fluid and subsequent injection of antibiotics or other sclerosing agents.  I think this would best be accomplished by the interventional radiologist, so I will see if Dr. Marcell Anger is able to do this procedure.  If he is, we will see him back in around 1 month to monitor his response.  If not, we may consider definitive hydrocelectomy with Lovenox bridging for anticoagulation.    Length time spent with the patient discussing these issues is 15 minutes.      Kathline Magic, MD      [id number]; D: 05/15/2014 10:10:20 T: 05/15/2014 45:03:88

## 2014-05-17 ENCOUNTER — Ambulatory Visit (INDEPENDENT_AMBULATORY_CARE_PROVIDER_SITE_OTHER): Payer: Self-pay | Admitting: INTERNAL MEDICINE

## 2014-05-17 ENCOUNTER — Ambulatory Visit
Admission: RE | Admit: 2014-05-17 | Discharge: 2014-05-17 | Disposition: A | Discharge status: Home / Self Care | Payer: Medicare Other | Source: Ambulatory Visit | Attending: INTERNAL MEDICINE | Admitting: INTERNAL MEDICINE

## 2014-05-17 DIAGNOSIS — Z7901 Long term (current) use of anticoagulants: Secondary | ICD-10-CM

## 2014-05-17 DIAGNOSIS — Z5181 Encounter for therapeutic drug level monitoring: Secondary | ICD-10-CM | POA: Insufficient documentation

## 2014-05-17 LAB — PT/INR
INR: 5.09
PROTHROMBIN TIME: 56.4 s (ref 9.4–12.5)

## 2014-05-17 NOTE — Telephone Encounter (Signed)
Critcal results   PT 56.4 and INR 5.09         Sharyn Lull [facility] lab tech

## 2014-05-17 NOTE — Progress Notes (Signed)
Left message for a return call Kenneth Weeks, [place]  05/17/2014, 12:52   Pt was notified of results Kenneth Short, MA  05/17/2014, 15:51

## 2014-05-17 NOTE — Progress Notes (Signed)
05/17/14 1200   [facility] PT/INR   Date INR drawn 05/16/14   INR 5.3   New warfarin dosage Hold it for one day  and then resume normal schedule   Comments Check INR in a month   Initials RTM   Goal INR 2.5-3.5

## 2014-05-18 ENCOUNTER — Ambulatory Visit (HOSPITAL_BASED_OUTPATIENT_CLINIC_OR_DEPARTMENT_OTHER): Admission: RE | Admit: 2014-05-18 | Payer: Medicare Other | Source: Ambulatory Visit

## 2014-05-18 ENCOUNTER — Other Ambulatory Visit: Payer: Medicare Other

## 2014-05-18 ENCOUNTER — Other Ambulatory Visit: Payer: Self-pay

## 2014-05-21 ENCOUNTER — Telehealth (INDEPENDENT_AMBULATORY_CARE_PROVIDER_SITE_OTHER): Payer: Self-pay | Admitting: INTERNAL MEDICINE

## 2014-05-21 NOTE — Telephone Encounter (Signed)
Jim from [facility]  Needs to speak to you about his coumadin level    He is sched for a procedure there   Please call him at [phone]

## 2014-05-23 ENCOUNTER — Other Ambulatory Visit (HOSPITAL_BASED_OUTPATIENT_CLINIC_OR_DEPARTMENT_OTHER): Payer: Self-pay

## 2014-05-23 DIAGNOSIS — Z7901 Long term (current) use of anticoagulants: Principal | ICD-10-CM

## 2014-05-23 DIAGNOSIS — Z5181 Encounter for therapeutic drug level monitoring: Secondary | ICD-10-CM

## 2014-05-24 ENCOUNTER — Other Ambulatory Visit: Payer: Medicare Other

## 2014-05-28 ENCOUNTER — Ambulatory Visit (HOSPITAL_BASED_OUTPATIENT_CLINIC_OR_DEPARTMENT_OTHER): Payer: Medicare Other

## 2014-05-29 ENCOUNTER — Encounter (INDEPENDENT_AMBULATORY_CARE_PROVIDER_SITE_OTHER): Payer: Self-pay | Admitting: INTERNAL MEDICINE

## 2014-05-29 ENCOUNTER — Ambulatory Visit (INDEPENDENT_AMBULATORY_CARE_PROVIDER_SITE_OTHER): Payer: Medicare Other | Admitting: INTERNAL MEDICINE

## 2014-05-29 VITALS — BP 130/82 | HR 100 | Ht 75.0 in | Wt 325.0 lb

## 2014-05-29 DIAGNOSIS — I509 Heart failure, unspecified: Secondary | ICD-10-CM

## 2014-05-29 MED ORDER — CARVEDILOL 12.5 MG TABLET
12.50 mg | ORAL_TABLET | Freq: Two times a day (BID) | ORAL | Status: DC
Start: 2014-05-29 — End: 2014-10-17

## 2014-05-29 MED ORDER — SPIRONOLACTONE 50 MG TABLET
50.0000 mg | ORAL_TABLET | Freq: Every day | ORAL | Status: DC
Start: 2014-05-29 — End: 2014-11-30

## 2014-05-29 NOTE — Progress Notes (Signed)
[facility]   [address]   [place], [place] [address]  [phone]  [phone]    Chronic Problems Office Visit     SUBJECTIVE   Kenneth Weeks is a 64 y.o. male here for management of multiple medical issues that include warfarin management, history of CHF and some worsening edema and breathing.     He says that he has been having his weight creep up over the past few weeks: he is up to 318-320-lbs. Today, he is 325-lbs.     No changes in diet recently   Has NOT been off of lasix during the past 4 weeks     The only new medications that he has taken have been Allegra and Flonase (as recommended at a recent office visit)       Vision -- glasses    Hearing -- no major changes in hearing     Sources of pain which are new -- same old aches and pains     Chewing/swallowing -- no problem chewing or swallowing     Contact with other providers -- none     Medications reviewed by me personally at this time:   Warfarin 7.4m daily    Synthroid 2054m daily   Lasix 60 mg BID     Physical activities   He is walking and more short of breath       Since our last visit together, has experienced the following healthcare interactions:     ER -- no visits   Urgent care visits -- nothing   Accidents with a car -- nothing of the sort   Injuries at home which resulted in injury -- no falls on the snow or the ice; no injuries at home   Medications from other providers -- nothing that is new   Surgeries or other procedures -- nothing of the sort       Changes in family history since we last met -- no new illnesses in family members that could affect       Changes in social history since we last met -- living in the same location       Medications reviewed    Current outpatient prescriptions:     furosemide (LASIX) 40 mg Oral Tablet, Take 40 mg by mouth Three times a day , Disp: , Rfl:     levothyroxine (SYNTHROID) 200 mcg Oral Tablet, take 200 mcg by mouth Once a day.  , Disp: , Rfl:     warfarin  (COUMADIN) 7.5 mg Oral Tablet, Take 7.5 mg by mouth Once a day 7.5 mg Monday- Friday and 10 mg Saturday and Sunday, Disp: , Rfl:       Review of Systems -     Constitutional: negative for fevers,  Eyes: no new blurry  ENT: no acute hearing loss   Cardiovascular: no new chest pain or palpitations  Respiratory: no new shortness of breath   GI: no new vomiting or diarrhea   GU: no new dysuria or hematuria   Musculoskeletal: no new aches and pains  Neurological: no new seizure or headaches   Emotional/Pscychiatric: no depression or anxiety  Skin: no suspicious moles which are new; no acute rashes   Endocrine: no new diabetes   Hematologic/Lymphatic: no DVT or bleeding issues from his warfarin       History   Smoking status    Never Smoker    Smokeless tobacco    Never Used  OBJECTIVE:  BP 130/82 mmHg   Pulse 100   Ht 1.905 m (_0 )   Wt 147.419 kg (325 lb)   BMI 40.62 kg/m2    Counseling done about the following face-to-face:     Labs reviewed by me   Drug-drug interactions considered and reviewed by me     Physical Exam     General -- alert   HEENT -- PERRL, EOMI   Heart -- irregular rate  and rhythm   Neck -- Thyroid is normal sized and not tender   Lungs -- clear and no wheezing; no crackles in bases   Abdomen -- soft and tender; nor guarding; obese   Neuro -- normal gait and tone; strength is 5/5  Skin -- no rashes noted   Psych -- pleasant and smiling   Vascular -- legs are markedly swollen and edematous     Labs -- reviewed by me in the EMR   March 2016    Creatinine 1.02   TSH 1.23   LFT's okay       Medications -- reviewed by me; drug-drug interactions considered by me      Counseling provided directly face-to-face about: more exercise on a regular basis       ASSESSMENT AND PLAN:  Worsening edema -- he has definitely gained some water weight. No hint of ACS recently; no changes in diet; no falling off of the diuretics. I am going to add a beta blocker and boost with some spironolactone as well for  improved diuresis. Short-term follow up. If not making headway, then CXR and ECHO      Warfarin management for mechanical heart valve -- he is reliable with taking his warfarin and is not scheduled to follow up with the cardiologist anytime soon. t     Atrial fibrilation -- no evidence  Symptoms burden     Hypothyroidism -- on replacement     Screening for Diabetes -- update in 2016     High cholesterol screening -- check at next labs     Obesity -- we talked about this today     Vision -- glasses; no changes     Hearing issues -- not limited     Exercise --  He needs to get more regular exercise     Need for HIV or hepatitis C screening -- no reason to screen     Tobacco abuse screening -- none at all     Alcohol and drug abuse screening -- none at all     Depression/anxiety screening -- he denies     Screening for colorectal cancer -- he is not due for screening     Immunizations -- I recommend a yearly flu shot         DISPOSITION:   Add a beta blocker now: Coreg 12.69m twice per day   Add spironolactone 50 mg daily as well     He had recent labs for GFR/LFT's       Follow up in 7-10 days

## 2014-05-30 ENCOUNTER — Inpatient Hospital Stay (HOSPITAL_BASED_OUTPATIENT_CLINIC_OR_DEPARTMENT_OTHER)
Admission: RE | Admit: 2014-05-30 | Discharge: 2014-05-30 | Disposition: A | Discharge status: Home / Self Care | Payer: Medicare Other | Source: Ambulatory Visit | Attending: Diagnostic Radiology | Admitting: Diagnostic Radiology

## 2014-05-30 ENCOUNTER — Other Ambulatory Visit (INDEPENDENT_AMBULATORY_CARE_PROVIDER_SITE_OTHER): Payer: Self-pay | Admitting: Urology

## 2014-05-30 ENCOUNTER — Inpatient Hospital Stay (HOSPITAL_BASED_OUTPATIENT_CLINIC_OR_DEPARTMENT_OTHER)
Admission: RE | Admit: 2014-05-30 | Discharge: 2014-05-30 | Disposition: A | Discharge status: Home / Self Care | Payer: Medicare Other | Source: Ambulatory Visit | Attending: Urology | Admitting: Urology

## 2014-05-30 ENCOUNTER — Other Ambulatory Visit (HOSPITAL_BASED_OUTPATIENT_CLINIC_OR_DEPARTMENT_OTHER): Payer: Self-pay

## 2014-05-30 DIAGNOSIS — N433 Hydrocele, unspecified: Secondary | ICD-10-CM

## 2014-05-30 DIAGNOSIS — Z5181 Encounter for therapeutic drug level monitoring: Secondary | ICD-10-CM | POA: Insufficient documentation

## 2014-05-30 DIAGNOSIS — Z7901 Long term (current) use of anticoagulants: Secondary | ICD-10-CM

## 2014-05-30 DIAGNOSIS — N432 Other hydrocele: Secondary | ICD-10-CM

## 2014-05-30 LAB — PROTHROMBIN TIME, THERAPEUTIC
COUMADIN-LAST DOSE DATE: 41716
INR: 2.86
PROTHROMBIN TIME: 31.5 s (ref 9.4–12.5)

## 2014-05-30 MED ORDER — DOXYCYCLINE HYCLATE 100 MG INTRAVENOUS POWDER FOR SOLUTION
Freq: Once | INTRAVENOUS | Status: DC
Start: 2014-05-31 — End: 2014-05-31
  Filled 2014-05-30: qty 200

## 2014-05-30 NOTE — Nurses Notes (Signed)
Patient to return 4/21 at 1030 for procedure.  No new labs required at that time.  Patient discharfged to home at this time.

## 2014-05-30 NOTE — Nurses Notes (Signed)
Dr. Marcell Anger in to see patient and discuss bleeding risk of hydrocele repair secondary to INR value.  Ultrasound being performed today but procedure to be deferred until tomorrow secondary to risk of bleeding.

## 2014-05-31 ENCOUNTER — Inpatient Hospital Stay (HOSPITAL_BASED_OUTPATIENT_CLINIC_OR_DEPARTMENT_OTHER)
Admission: RE | Admit: 2014-05-31 | Discharge: 2014-05-31 | Disposition: A | Discharge status: Home / Self Care | Payer: Medicare Other | Source: Ambulatory Visit | Attending: Urology | Admitting: Urology

## 2014-05-31 ENCOUNTER — Other Ambulatory Visit (INDEPENDENT_AMBULATORY_CARE_PROVIDER_SITE_OTHER): Payer: Self-pay | Admitting: Urology

## 2014-05-31 DIAGNOSIS — Z7901 Long term (current) use of anticoagulants: Secondary | ICD-10-CM | POA: Insufficient documentation

## 2014-05-31 DIAGNOSIS — N433 Hydrocele, unspecified: Secondary | ICD-10-CM | POA: Insufficient documentation

## 2014-05-31 MED ORDER — FENTANYL (PF) 50 MCG/ML INJECTION SOLUTION
100.0000 ug | INTRAMUSCULAR | Status: AC
Start: 2014-05-31 — End: 2014-05-31
  Administered 2014-05-31: 100 ug via INTRAVENOUS
  Filled 2014-05-31: qty 2

## 2014-05-31 MED ORDER — PANTOPRAZOLE 40 MG INTRAVENOUS SOLUTION
40.0000 mg | Freq: Once | INTRAVENOUS | Status: AC
Start: 2014-05-31 — End: 2014-05-31
  Administered 2014-05-31: 40 mg via INTRAVENOUS
  Filled 2014-05-31: qty 10

## 2014-05-31 MED ORDER — LIDOCAINE HCL 10 MG/ML (1 %) INJECTION SOLUTION
2.0000 mL | INTRAMUSCULAR | Status: DC
Start: 2014-05-31 — End: 2014-05-31
  Filled 2014-05-31: qty 20

## 2014-05-31 MED ORDER — KETOROLAC 30 MG/ML (1 ML) INJECTION SOLUTION
60.0000 mg | Freq: Once | INTRAMUSCULAR | Status: AC
Start: 2014-05-31 — End: 2014-05-31
  Administered 2014-05-31: 60 mg via INTRAVENOUS
  Filled 2014-05-31: qty 2

## 2014-05-31 NOTE — Nurses Notes (Signed)
1140:  Patient to IR bed 4 for pre-procedure work-up of hydrocele drain.  Ernestina Penna, NP speaking with patient extensively regarding plan for procedure.  Questions answered.  1215:  Preparing for procedure.  Dr. Marlinda Mike in to see patient.  Medications administered, see MAR.  1233:  Time out completed.  1237:  U/S images obtained. 1% Lidocaine administered by Dr. Marlinda Mike- 3 ml.  1239:  Hydrocele accessed with 18 G needle under U/S guidance. Drainage begins.  1244:  Drainage complete, total of 550 ml clear,tan fluid removed. Viewed under ultrasond.   1245:  Doxycycline/marcaine injected by Dr. Marlinda Mike.  Final images reviewed.  Procedure completed.  1249:  Patient instructed to massage site every 15 minutes.  1310:  Discharge instructions reveiwed with patient, all questions answered.  Patient provided with prescription for pain medication.  Discharged to home ambulatory with friend.

## 2014-06-05 ENCOUNTER — Telehealth (INDEPENDENT_AMBULATORY_CARE_PROVIDER_SITE_OTHER): Payer: Self-pay | Admitting: INTERNAL MEDICINE

## 2014-06-05 NOTE — Telephone Encounter (Signed)
Dr. Anabel Bene spoke to patient after encounter was opened

## 2014-06-06 ENCOUNTER — Ambulatory Visit (INDEPENDENT_AMBULATORY_CARE_PROVIDER_SITE_OTHER): Payer: Medicare Other | Admitting: Urology

## 2014-06-06 ENCOUNTER — Encounter (INDEPENDENT_AMBULATORY_CARE_PROVIDER_SITE_OTHER): Payer: Self-pay | Admitting: Urology

## 2014-06-06 VITALS — BP 119/73 | HR 96 | Temp 98.0°F | Ht 75.5 in | Wt 324.2 lb

## 2014-06-06 DIAGNOSIS — N492 Inflammatory disorders of scrotum: Secondary | ICD-10-CM

## 2014-06-06 DIAGNOSIS — N5089 Other specified disorders of the male genital organs: Secondary | ICD-10-CM

## 2014-06-06 DIAGNOSIS — E8770 Fluid overload, unspecified: Secondary | ICD-10-CM

## 2014-06-06 DIAGNOSIS — N433 Hydrocele, unspecified: Secondary | ICD-10-CM

## 2014-06-06 MED ORDER — CEPHALEXIN 500 MG CAPSULE
500.00 mg | ORAL_CAPSULE | Freq: Three times a day (TID) | ORAL | Status: AC
Start: 2014-06-06 — End: 2014-06-13

## 2014-06-06 NOTE — Progress Notes (Signed)
[facility]                                                                              PROGRESS NOTE    PATIENT NAME: Agena, Devine ITGPQD:[id number]  DATE OF SERVICE:06/06/2014  DATE OF BIRTH: 1950/11/28    HISTORY OF PRESENT ILLNESS:  A 64 year old male who presents for followup.  He has a history of a large hydrocele for which he underwent drainage and injection of sclerosing agent with tetracycline with Dr. Marcell Anger last week.  The patient reports a progressive swelling of both sides of his scrotum now.  On exam, he has evidence of severe scrotal edema.  The patient was worried that this represented recurrence of his hydrocele or development of a hydrocele on the other side.  In addition to the scrotal edema, he has significant bilateral pedal edema.  The patient indicates that he has been gaining fluid weight.  He indicates that he recently was started on heavier doses of diuretics because of the fluid were retention.  My concern is that he has a mechanical heart valve and he was temporarily off of his anticoagulation in order to get this procedure done and I advised him to follow up with his primary care physician and cardiologist to have his heart evaluated as it does appear that having any exacerbation of possible CHF coincident with a hydrocele drainage procedure.     ASSESSMENT AND PLAN:  He does have some significant tenderness and redness to his scrotum. So I am going to cover him with Keflex for suspected scrotal cellulitis.  I will see him back in 1 month for ongoing followup or earlier if symptoms progress or worsen.  I did discuss that if his edema gets much worse, he may require Foley catheter in order to drain his bladder, but we will hold off on this for the time being.  Strict return precautions to the Emergency Department were discussed including if the patient develops any shortness of breath, chest pain, increasing orthopnea or  increasing weight, he should go to the Emergency Department as he may require prompt evaluation for possible CHF as well as aggressive diuresis.  The patient reports that he will attempt to contact his primary care physician and cardiologist as soon as possible.      Kathline Magic, MD      [id number]; D: 06/06/2014 13:25:26 T: 06/06/2014 15:53:33

## 2014-06-10 DIAGNOSIS — K922 Gastrointestinal hemorrhage, unspecified: Secondary | ICD-10-CM

## 2014-06-10 HISTORY — DX: Gastrointestinal hemorrhage, unspecified: K92.2

## 2014-06-12 ENCOUNTER — Encounter (INDEPENDENT_AMBULATORY_CARE_PROVIDER_SITE_OTHER): Payer: Self-pay | Admitting: INTERNAL MEDICINE

## 2014-06-12 ENCOUNTER — Encounter (INDEPENDENT_AMBULATORY_CARE_PROVIDER_SITE_OTHER): Payer: Medicare Other | Admitting: Urology

## 2014-06-12 ENCOUNTER — Encounter (INDEPENDENT_AMBULATORY_CARE_PROVIDER_SITE_OTHER): Payer: Medicare Other | Admitting: INTERNAL MEDICINE

## 2014-06-12 ENCOUNTER — Ambulatory Visit
Admission: RE | Admit: 2014-06-12 | Discharge: 2014-06-12 | Disposition: A | Discharge status: Home / Self Care | Payer: Medicare Other | Source: Ambulatory Visit | Attending: INTERNAL MEDICINE | Admitting: INTERNAL MEDICINE

## 2014-06-12 ENCOUNTER — Ambulatory Visit (INDEPENDENT_AMBULATORY_CARE_PROVIDER_SITE_OTHER): Payer: Medicare Other | Admitting: INTERNAL MEDICINE

## 2014-06-12 VITALS — BP 130/82 | HR 112 | Temp 97.2°F | Ht 76.0 in | Wt 313.0 lb

## 2014-06-12 DIAGNOSIS — I509 Heart failure, unspecified: Secondary | ICD-10-CM

## 2014-06-12 LAB — COMPREHENSIVE METABOLIC PROFILE - BMC/JMC ONLY
ALBUMIN/GLOBULIN RATIO: 0.8
ALBUMIN: 3.1 g/dL — ABNORMAL LOW (ref 3.2–5.0)
ALKALINE PHOSPHATASE: 73 IU/L (ref 35–120)
ALT (SGPT): 17 IU/L (ref 0–63)
ANION GAP: 7 mmol/L (ref 3–11)
AST (SGOT): 21 IU/L (ref 0–45)
BILIRUBIN, TOTAL: 1 mg/dL (ref 0.0–1.3)
BUN: 14 mg/dL (ref 6–22)
CALCIUM: 8.8 mg/dL (ref 8.5–10.5)
CARBON DIOXIDE: 32 mmol/L (ref 22–32)
CHLORIDE: 99 mmol/L — ABNORMAL LOW (ref 101–111)
CREATININE: 1.27 mg/dL (ref 0.72–1.30)
ESTIMATED GLOMERULAR FILTRATION RATE: 57 mL/min (ref >60)
ESTIMATED GLOMERULAR FILTRATION RATE: 57 mL/min — ABNORMAL LOW (ref >60)
GLUCOSE: 136 mg/dL — ABNORMAL HIGH (ref 70–110)
POTASSIUM: 4.2 mmol/L (ref 3.5–5.0)
SODIUM: 138 mmol/L (ref 136–145)
SODIUM: 138 mmol/L (ref 136–145)
TOTAL PROTEIN: 6.9 g/dL (ref 6.0–8.0)
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
## 2014-06-26 ENCOUNTER — Ambulatory Visit (INDEPENDENT_AMBULATORY_CARE_PROVIDER_SITE_OTHER): Payer: Medicare Other | Admitting: INTERNAL MEDICINE

## 2014-06-26 ENCOUNTER — Ambulatory Visit
Admission: RE | Admit: 2014-06-26 | Discharge: 2014-06-26 | Disposition: A | Discharge status: Home / Self Care | Payer: Medicare Other | Source: Ambulatory Visit | Attending: INTERNAL MEDICINE | Admitting: INTERNAL MEDICINE

## 2014-06-26 ENCOUNTER — Encounter (INDEPENDENT_AMBULATORY_CARE_PROVIDER_SITE_OTHER): Payer: Self-pay | Admitting: INTERNAL MEDICINE

## 2014-06-26 VITALS — BP 142/90 | HR 92 | Temp 97.0°F | Wt 286.0 lb

## 2014-06-26 DIAGNOSIS — I509 Heart failure, unspecified: Secondary | ICD-10-CM

## 2014-06-26 LAB — COMPREHENSIVE METABOLIC PROFILE - BMC/JMC ONLY
ALBUMIN/GLOBULIN RATIO: 1
ALBUMIN: 3.8 g/dL (ref 3.2–5.0)
ALKALINE PHOSPHATASE: 68 IU/L (ref 35–120)
ALT (SGPT): 18 IU/L (ref 0–63)
ANION GAP: 8 mmol/L (ref 3–11)
ANION GAP: 8 mmol/L (ref 3–11)
AST (SGOT): 24 IU/L (ref 0–45)
BILIRUBIN, TOTAL: 0.9 mg/dL (ref 0.0–1.3)
BUN: 23 mg/dL — ABNORMAL HIGH (ref 6–22)
CALCIUM: 8.8 mg/dL (ref 8.5–10.5)
CARBON DIOXIDE: 28 mmol/L (ref 22–32)
CHLORIDE: 96 mmol/L — ABNORMAL LOW (ref 101–111)
CREATININE: 1.22 mg/dL (ref 0.72–1.30)
ESTIMATED GLOMERULAR FILTRATION RATE: 59 mL/min — ABNORMAL LOW (ref >60)
ESTIMATED GLOMERULAR FILTRATION RATE: 59 mL/min — ABNORMAL LOW (ref >60)
GLUCOSE: 137 mg/dL — ABNORMAL HIGH (ref 70–110)
POTASSIUM: 4.3 mmol/L (ref 3.5–5.0)
SODIUM: 132 mmol/L — ABNORMAL LOW (ref 136–145)
TOTAL PROTEIN: 7.5 g/dL (ref 6.0–8.0)
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
## 2014-07-06 ENCOUNTER — Ambulatory Visit (INDEPENDENT_AMBULATORY_CARE_PROVIDER_SITE_OTHER): Payer: Medicare Other | Admitting: Urology

## 2014-07-06 ENCOUNTER — Encounter (INDEPENDENT_AMBULATORY_CARE_PROVIDER_SITE_OTHER): Payer: Self-pay | Admitting: Urology

## 2014-07-06 VITALS — BP 109/73 | HR 94 | Temp 97.2°F | Ht 78.5 in | Wt 278.0 lb

## 2014-07-06 DIAGNOSIS — N508 Other specified disorders of male genital organs: Secondary | ICD-10-CM

## 2014-07-06 DIAGNOSIS — N433 Hydrocele, unspecified: Principal | ICD-10-CM

## 2014-07-06 DIAGNOSIS — E8770 Fluid overload, unspecified: Secondary | ICD-10-CM

## 2014-07-06 DIAGNOSIS — R601 Generalized edema: Secondary | ICD-10-CM

## 2014-07-06 DIAGNOSIS — N5089 Other specified disorders of the male genital organs: Secondary | ICD-10-CM

## 2014-07-08 NOTE — Progress Notes (Signed)
[facility]                                                                              PROGRESS NOTE    PATIENT NAME: [place], [place] UDTHYH:[id number]  DATE OF SERVICE:07/06/2014  DATE OF BIRTH: 05-14-1950    SUBJECTIVE:  A 64 year old male presenting for followup, status post hydrocele drainage and injection of sclerosing agent.  The patient is doing markedly better from when I saw him last month.  His generalized edema has markedly improved and this is being very effectively managed by Dr. Anabel Bene with diuretic medications.   His hydrocele also appears much better.  When I saw him last month, I could not really make an assessment of this because he had overriding severe scrotal edema.  He does have a moderate residual hydrocele but he is very happy with the results.  If this progresses, we could always consider doing a repeat drainage and sclerosing procedure versus surgical repair but this is high-risk because of his requirement for blood thinning medications.  Fifteen minutes were spent with the patient discussing his diagnosis and treatment options.      Kathline Magic, MD      AS/UO/[id number]; D: 07/06/2014 [id number] T: 07/08/2014 10:14:15

## 2014-07-09 ENCOUNTER — Emergency Department (HOSPITAL_BASED_OUTPATIENT_CLINIC_OR_DEPARTMENT_OTHER): Payer: Medicare Other

## 2014-07-09 ENCOUNTER — Encounter (HOSPITAL_BASED_OUTPATIENT_CLINIC_OR_DEPARTMENT_OTHER): Payer: Self-pay

## 2014-07-09 ENCOUNTER — Ambulatory Visit (HOSPITAL_BASED_OUTPATIENT_CLINIC_OR_DEPARTMENT_OTHER): Payer: Self-pay

## 2014-07-09 ENCOUNTER — Encounter (HOSPITAL_BASED_OUTPATIENT_CLINIC_OR_DEPARTMENT_OTHER)
Admission: EM | Disposition: A | Discharge status: Other Acute Inpatient Hospital | Payer: Self-pay | Source: Home / Self Care | Attending: INTERNAL MEDICINE

## 2014-07-09 ENCOUNTER — Inpatient Hospital Stay (HOSPITAL_BASED_OUTPATIENT_CLINIC_OR_DEPARTMENT_OTHER)
Admission: EM | Admit: 2014-07-09 | Discharge: 2014-07-10 | DRG: 378 | Disposition: A | Discharge status: Other Acute Inpatient Hospital | Payer: Medicare Other | Attending: INTERNAL MEDICINE | Admitting: INTERNAL MEDICINE

## 2014-07-09 ENCOUNTER — Inpatient Hospital Stay (HOSPITAL_BASED_OUTPATIENT_CLINIC_OR_DEPARTMENT_OTHER): Payer: Medicare Other | Admitting: Student in an Organized Health Care Education/Training Program

## 2014-07-09 DIAGNOSIS — Z952 Presence of prosthetic heart valve: Secondary | ICD-10-CM

## 2014-07-09 DIAGNOSIS — R680 Hypothermia, not associated with low environmental temperature: Secondary | ICD-10-CM | POA: Diagnosis present

## 2014-07-09 DIAGNOSIS — K921 Melena: Secondary | ICD-10-CM

## 2014-07-09 DIAGNOSIS — I482 Chronic atrial fibrillation: Secondary | ICD-10-CM | POA: Diagnosis present

## 2014-07-09 DIAGNOSIS — R578 Other shock: Secondary | ICD-10-CM

## 2014-07-09 DIAGNOSIS — E669 Obesity, unspecified: Secondary | ICD-10-CM | POA: Diagnosis present

## 2014-07-09 DIAGNOSIS — K219 Gastro-esophageal reflux disease without esophagitis: Secondary | ICD-10-CM | POA: Diagnosis present

## 2014-07-09 DIAGNOSIS — R9431 Abnormal electrocardiogram [ECG] [EKG]: Secondary | ICD-10-CM

## 2014-07-09 DIAGNOSIS — I4891 Unspecified atrial fibrillation: Secondary | ICD-10-CM

## 2014-07-09 DIAGNOSIS — K922 Gastrointestinal hemorrhage, unspecified: Secondary | ICD-10-CM

## 2014-07-09 DIAGNOSIS — N189 Chronic kidney disease, unspecified: Secondary | ICD-10-CM

## 2014-07-09 DIAGNOSIS — Z7901 Long term (current) use of anticoagulants: Secondary | ICD-10-CM

## 2014-07-09 DIAGNOSIS — N179 Acute kidney failure, unspecified: Secondary | ICD-10-CM

## 2014-07-09 DIAGNOSIS — E039 Hypothyroidism, unspecified: Secondary | ICD-10-CM | POA: Diagnosis present

## 2014-07-09 DIAGNOSIS — D62 Acute posthemorrhagic anemia: Secondary | ICD-10-CM | POA: Diagnosis present

## 2014-07-09 DIAGNOSIS — I878 Other specified disorders of veins: Secondary | ICD-10-CM | POA: Diagnosis present

## 2014-07-09 DIAGNOSIS — I509 Heart failure, unspecified: Secondary | ICD-10-CM | POA: Diagnosis present

## 2014-07-09 DIAGNOSIS — E1165 Type 2 diabetes mellitus with hyperglycemia: Secondary | ICD-10-CM | POA: Diagnosis present

## 2014-07-09 DIAGNOSIS — Q8740 Marfan's syndrome, unspecified: Secondary | ICD-10-CM

## 2014-07-09 DIAGNOSIS — R197 Diarrhea, unspecified: Secondary | ICD-10-CM

## 2014-07-09 DIAGNOSIS — R579 Shock, unspecified: Secondary | ICD-10-CM | POA: Diagnosis present

## 2014-07-09 DIAGNOSIS — R791 Abnormal coagulation profile: Secondary | ICD-10-CM

## 2014-07-09 DIAGNOSIS — R109 Unspecified abdominal pain: Secondary | ICD-10-CM

## 2014-07-09 DIAGNOSIS — Z6833 Body mass index (BMI) 33.0-33.9, adult: Secondary | ICD-10-CM

## 2014-07-09 HISTORY — DX: Syncope and collapse: R55

## 2014-07-09 HISTORY — DX: Marfan's syndrome, unspecified: Q87.40

## 2014-07-09 HISTORY — PX: HX UPPER ENDOSCOPY: 2100001144

## 2014-07-09 HISTORY — DX: Presence of spectacles and contact lenses: Z97.3

## 2014-07-09 HISTORY — DX: Type 2 diabetes mellitus without complications (CMS HCC): E11.9

## 2014-07-09 HISTORY — DX: Disorder of thyroid, unspecified: E07.9

## 2014-07-09 HISTORY — DX: Chronic kidney disease, unspecified: N18.9

## 2014-07-09 HISTORY — DX: Other shock (CMS HCC): R57.8

## 2014-07-09 HISTORY — DX: Hydrocele, unspecified: N43.3

## 2014-07-09 HISTORY — DX: Unspecified atrial fibrillation (CMS HCC): I48.91

## 2014-07-09 HISTORY — DX: Personal history of other medical treatment: Z92.89

## 2014-07-09 LAB — URINALYSIS WITH CULTURE REFLEX IF INDICATED BMC/JMC ONLY
BILIRUBIN,URINE: NEGATIVE mg/dL
BLOOD,URINE: NEGATIVE mg/dL
GLUCOSE, URINE: NEGATIVE mg/dL
KETONES,URINE: NEGATIVE mg/dL
LEUKOCYTE ESTERASE,URINE: NEGATIVE LEU/uL
LEUKOCYTE ESTERASE,URINE: NEGATIVE LEU/uL
NITRITES,URINE: NEGATIVE
PH,URINE: 5 (ref <8.0)
PROTEIN, URINE, RANDOM: NEGATIVE mg/dL
SPECIFIC GRAVITY,URINE: 1.017 (ref <1.022)
UROBILINOGEN, URINE: <2 mg/dL (ref <2.0)

## 2014-07-09 LAB — CBC
BASOPHIL #: 0.1 K/uL (ref 0.0–0.1)
BASOPHIL #: 0 10*3/uL (ref 0.0–0.1)
BASOPHILS %: 0.7 % (ref 0.0–2.5)
BASOPHILS %: 0.3 % (ref 0.0–2.5)
EOSINOPHIL #: 0 K/uL (ref 0.0–0.5)
EOSINOPHIL #: 0 K/uL (ref 0.0–0.5)
EOSINOPHIL %: 0.1 % (ref 0.0–5.2)
EOSINOPHIL %: 0 % (ref 0.0–5.2)
HCT: 21.1 % — CL (ref 40.0–50.0)
HCT: 17.3 % — CL (ref 40.0–50.0)
HGB: 6.3 g/dL — CL (ref 13.5–18.0)
HGB: 5.1 g/dL — CL (ref 13.5–18.0)
LYMPHOCYTE #: 1.1 10*3/uL (ref 0.7–3.2)
LYMPHOCYTE #: 0.7 K/uL (ref 0.7–3.2)
LYMPHOCYTE %: 11.8 % — ABNORMAL LOW (ref 15.0–43.0)
LYMPHOCYTE %: 8.8 % — ABNORMAL LOW (ref 15.0–43.0)
MCH: 21.1 pg — ABNORMAL LOW (ref 28.3–34.3)
MCHC: 29.7 g/dL — ABNORMAL LOW (ref 32.0–36.0)
MCV: 68.8 fL — AB (ref 82.0–97.0)
MONOCYTE #: 0.5 10*3/uL (ref 0.2–0.9)
MONOCYTE %: 4.1 % — ABNORMAL LOW (ref 4.8–12.0)
MPV: 8.5 fL (ref 7.4–10.5)
MPV: 8.7 fL (ref 7.4–10.5)
NRBC ABSOLUTE: 0 K/uL (ref 0–0.02)
NRBC: 0 /100 WBC (ref 0–0.6)
NRBC: 0 /100{WBCs} (ref 0–0.6)
PLATELET COUNT: 252 K/uL (ref 150–450)
PLATELET COUNT: 197 10*3/uL — AB (ref 150–450)
PMN #: 7.6 K/uL — ABNORMAL HIGH (ref 1.5–6.5)
PMN #: 7.4 10*3/uL — ABNORMAL HIGH (ref 1.5–6.5)
PMN %: 81.9 % — ABNORMAL HIGH (ref 43.0–76.0)
PMN %: 86.8 % — ABNORMAL HIGH (ref 43.0–76.0)
RBC: 3.07 M/uL — ABNORMAL LOW (ref 4.40–5.80)
RBC: 2.44 M/uL — ABNORMAL LOW (ref 4.40–5.80)
RDW: 19.4 % (ref 10.5–14.5)
RDW: 20 % — ABNORMAL HIGH (ref 10.5–14.5)
WBC: 9.3 K/uL (ref 4.0–11.0)
WBC: 9.3 K/uL (ref 4.0–11.0)
WBC: 8.5 K/uL (ref 4.0–11.0)

## 2014-07-09 LAB — MORPHOLOGY SCAN - BMC/JMC ONLY: PLATELET ESTIMATE: ADEQUATE

## 2014-07-09 LAB — COMPREHENSIVE METABOLIC PROFILE - BMC/JMC ONLY
ALBUMIN/GLOBULIN RATIO: 1.2
ALBUMIN: 2.9 g/dL — ABNORMAL LOW (ref 3.2–5.0)
ALKALINE PHOSPHATASE: 40 IU/L (ref 35–120)
ALKALINE PHOSPHATASE: 44 IU/L (ref 35–120)
ANION GAP: 5 mmol/L (ref 3–11)
ANION GAP: 4 mmol/L (ref 3–11)
AST (SGOT): 17 IU/L (ref 0–45)
BILIRUBIN, TOTAL: 2.1 mg/dL — ABNORMAL HIGH (ref 0.0–1.3)
BUN: 21 mg/dL (ref 6–22)
CALCIUM: 7.7 mg/dL — ABNORMAL LOW (ref 8.5–10.5)
CARBON DIOXIDE: 23 mmol/L (ref 22–32)
CARBON DIOXIDE: 25 mmol/L (ref 22–32)
CHLORIDE: 105 mmol/L (ref 101–111)
CREATININE: 1.26 mg/dL (ref 0.72–1.30)
ESTIMATED GLOMERULAR FILTRATION RATE: 57 mL/min — ABNORMAL LOW (ref >60)
GLUCOSE: 190 mg/dL — ABNORMAL HIGH (ref 70–110)
GLUCOSE: 144 mg/dL — ABNORMAL HIGH (ref 70–110)
POTASSIUM: 4.3 mmol/L (ref 3.5–5.0)
POTASSIUM: 4.5 mmol/L (ref 3.5–5.0)
SODIUM: 133 mmol/L — ABNORMAL LOW (ref 136–145)
SODIUM: 133 mmol/L — ABNORMAL LOW (ref 136–145)
SODIUM: 138 mmol/L (ref 136–145)
TOTAL PROTEIN: 5.2 g/dL — ABNORMAL LOW (ref 6.0–8.0)

## 2014-07-09 LAB — AMYLASE: AMYLASE: 39 U/L (ref 0–100)

## 2014-07-09 LAB — CBC W/ AUTOMATED DIFF - BMC ONLY
BASOPHIL #: 0 10*3/uL (ref 0.0–0.1)
BASOPHILS %: 0.4 % (ref 0.0–2.5)
EOSINOPHIL #: 0 10*3/uL (ref 0.0–0.5)
EOSINOPHIL %: 0 % (ref 0.0–5.2)
HGB: 7.9 g/dL — CL (ref 13.5–18.0)
LYMPHOCYTE #: 0.8 10*3/uL (ref 0.7–3.2)
LYMPHOCYTE %: 8.9 % — ABNORMAL LOW (ref 15.0–43.0)
MCHC: 32.4 g/dL (ref 32.0–36.0)
MONOCYTE #: 0.5 10*3/uL (ref 0.2–0.9)
MONOCYTE %: 5.8 % (ref 4.8–12.0)
MPV: 8.4 fL (ref 7.4–10.5)
NRBC ABSOLUTE: 0 K/uL (ref 0–0.02)
NRBC: 0 /100 WBC (ref 0–0.6)
PLATELET COUNT: 167 10*3/uL (ref 150–450)
PMN #: 7.5 10*3/uL — ABNORMAL HIGH (ref 1.5–6.5)
PMN %: 84.9 % — ABNORMAL HIGH (ref 43.0–76.0)
RBC: 3.22 M/uL — ABNORMAL LOW (ref 4.40–5.80)
RDW: 21.1 % — ABNORMAL HIGH (ref 10.5–14.5)
WBC: 8.9 10*3/uL (ref 4.0–11.0)

## 2014-07-09 LAB — CDIFF BY PCR - BMC/JMC ONLY
027PRESUMPTIVE: NEGATIVE
TOXIGENIC CDIFF: NEGATIVE

## 2014-07-09 LAB — AMMONIA: AMMONIA: 10 umol/L (ref 0–35)

## 2014-07-09 LAB — TROPONIN-I: TROPONIN-I: <0.03 ng/mL (ref 0.00–0.06)

## 2014-07-09 LAB — PT/INR
INR: 3.3
INR: 1.91
PROTHROMBIN TIME: 115.7 s (ref 9.4–12.5)
PROTHROMBIN TIME: 36.7 s (ref 9.4–12.5)
PROTHROMBIN TIME: 21.1 s (ref 9.4–12.5)

## 2014-07-09 LAB — ABO & RH: ABO/RH(D): B POS

## 2014-07-09 LAB — LIPASE: LIPASE: 20 U/L (ref 0–60)

## 2014-07-09 LAB — LACTIC ACID LEVEL: LACTIC ACID: 2.6 mmol/L — ABNORMAL HIGH (ref 0.5–2.2)

## 2014-07-09 LAB — PERFORM POC FINGERSTICK GLUCOSE
BLD GLUCOSE POCT: 122 mg/dL — ABNORMAL HIGH (ref 60–100)
BLD GLUCOSE POCT: 122 mg/dL — ABNORMAL HIGH (ref 60–100)

## 2014-07-09 SURGERY — GASTROSCOPY WITH BANDING
Anesthesia: IV General | Site: Mouth

## 2014-07-09 SURGERY — GASTROSCOPY
Anesthesia: General | Site: Mouth | Wound class: Clean Contaminated Wounds-The respiratory, GI, Genital, or urinary

## 2014-07-09 MED ORDER — PROPOFOL 10 MG/ML IV BOLUS
INJECTION | Freq: Once | INTRAVENOUS | Status: DC | PRN
Start: 2014-07-09 — End: 2014-07-09
  Administered 2014-07-09: 200 mg via INTRAVENOUS

## 2014-07-09 MED ORDER — PHYTONADIONE (VITAMIN K1) 10 MG/ML INJECTION SOLUTION
5.0000 mg | Freq: Once | INTRAVENOUS | Status: AC
Start: 2014-07-09 — End: 2014-07-09
  Administered 2014-07-09: 0 mg via INTRAVENOUS
  Administered 2014-07-09: 5 mg via INTRAVENOUS
  Filled 2014-07-09: qty 0.5

## 2014-07-09 MED ORDER — MORPHINE 2 MG/ML INTRAVENOUS CARTRIDGE
2.0000 mg | CARTRIDGE | INTRAVENOUS | Status: DC | PRN
Start: 2014-07-09 — End: 2014-07-10

## 2014-07-09 MED ORDER — PANTOPRAZOLE 40 MG INTRAVENOUS SOLUTION
80.00 mg | INTRAVENOUS | Status: DC
Start: 2014-07-09 — End: 2014-07-09

## 2014-07-09 MED ORDER — GI COCKTAIL (ANTACID SUSP, HYOSCYAMINE, LIDOCAINE)
ORAL | Status: AC
Start: 2014-07-09 — End: 2014-07-09
  Filled 2014-07-09: qty 55

## 2014-07-09 MED ORDER — SODIUM CHLORIDE 0.9 % INTRAVENOUS SOLUTION
INTRAVENOUS | Status: AC
Start: 2014-07-09 — End: 2014-07-09
  Filled 2014-07-09: qty 20

## 2014-07-09 MED ORDER — LIDOCAINE HCL 10 MG/ML (1 %) INJECTION SOLUTION
INTRAMUSCULAR | Status: AC
Start: 2014-07-09 — End: 2014-07-10
  Administered 2014-07-09: 0
  Filled 2014-07-09: qty 20

## 2014-07-09 MED ORDER — SODIUM CHLORIDE 0.9 % INTRAVENOUS SOLUTION
INTRAVENOUS | Status: DC
Start: 2014-07-09 — End: 2014-07-10

## 2014-07-09 MED ORDER — FENTANYL (PF) 50 MCG/ML INJECTION SOLUTION
Freq: Once | INTRAMUSCULAR | Status: DC | PRN
Start: 2014-07-09 — End: 2014-07-09
  Administered 2014-07-09: 100 ug via INTRAVENOUS

## 2014-07-09 MED ORDER — SUCCINYLCHOLINE CHLORIDE 20 MG/ML INJECTION SOLUTION
Freq: Once | INTRAMUSCULAR | Status: DC | PRN
Start: 2014-07-09 — End: 2014-07-09

## 2014-07-09 MED ORDER — MORPHINE 4 MG/ML INTRAVENOUS CARTRIDGE
4.0000 mg | CARTRIDGE | INTRAVENOUS | Status: DC | PRN
Start: 2014-07-09 — End: 2014-07-10

## 2014-07-09 MED ORDER — LEVOTHYROXINE 100 MCG TABLET
200.00 ug | ORAL_TABLET | Freq: Every morning | ORAL | Status: DC
Start: 2014-07-09 — End: 2014-07-09

## 2014-07-09 MED ORDER — PANTOPRAZOLE 40 MG INTRAVENOUS SOLUTION
INTRAVENOUS | Status: AC
Start: 2014-07-09 — End: 2014-07-09
  Filled 2014-07-09: qty 20

## 2014-07-09 MED ORDER — OCTREOTIDE ACETATE 100 MCG/ML INJECTION SOLUTION
50.0000 ug | Freq: Once | INTRAMUSCULAR | Status: AC
Start: 2014-07-09 — End: 2014-07-09
  Administered 2014-07-09: 50 ug via INTRAVENOUS
  Filled 2014-07-09: qty 1

## 2014-07-09 MED ORDER — SODIUM CHLORIDE 0.9 % INTRAVENOUS SOLUTION
50.00 ug/h | INTRAVENOUS | Status: DC
Start: 2014-07-09 — End: 2014-07-09
  Administered 2014-07-09: 50 ug/h via INTRAVENOUS
  Administered 2014-07-09: 0 ug/h via INTRAVENOUS
  Filled 2014-07-09: qty 2.5

## 2014-07-09 MED ORDER — NITROGLYCERIN 0.4 MG SUBLINGUAL TABLET
0.40 mg | SUBLINGUAL_TABLET | SUBLINGUAL | Status: DC | PRN
Start: 2014-07-09 — End: 2014-07-10

## 2014-07-09 MED ORDER — LIDOCAINE HCL 2 % MUCOSAL SOLUTION
Status: AC
Start: 2014-07-09 — End: 2014-07-09
  Filled 2014-07-09: qty 15

## 2014-07-09 MED ORDER — ONDANSETRON HCL (PF) 4 MG/2 ML INJECTION SOLUTION
4.00 mg | Freq: Once | INTRAMUSCULAR | Status: DC | PRN
Start: 2014-07-09 — End: 2014-07-10

## 2014-07-09 MED ORDER — IPRATROPIUM BROMIDE 0.02 % SOLUTION FOR INHALATION
0.50 mg | Freq: Once | RESPIRATORY_TRACT | Status: DC | PRN
Start: 2014-07-09 — End: 2014-07-09

## 2014-07-09 MED ORDER — CEFTRIAXONE 1 GRAM/50 ML IN DEXTROSE (ISO-OSMOT) INTRAVENOUS PIGGYBACK
1.0000 g | INJECTION | Freq: Once | INTRAVENOUS | Status: DC
Start: 2014-07-09 — End: 2014-07-09

## 2014-07-09 MED ORDER — MORPHINE 4 MG/ML INTRAVENOUS CARTRIDGE
4.0000 mg | CARTRIDGE | Freq: Once | INTRAVENOUS | Status: DC | PRN
Start: 2014-07-09 — End: 2014-07-10

## 2014-07-09 MED ORDER — CEFTRIAXONE 1 GRAM/50 ML IN DEXTROSE (ISO-OSMOT) INTRAVENOUS PIGGYBACK
1.0000 g | INJECTION | INTRAVENOUS | Status: DC
Start: 2014-07-09 — End: 2014-07-10
  Administered 2014-07-09: 1 g via INTRAVENOUS
  Filled 2014-07-09 (×2): qty 50

## 2014-07-09 MED ORDER — ACETAMINOPHEN 325 MG TABLET
650.0000 mg | ORAL_TABLET | ORAL | Status: DC | PRN
Start: 2014-07-09 — End: 2014-07-09

## 2014-07-09 MED ORDER — LEVOTHYROXINE 100 MCG IV SOLUTION - EAST
100.00 ug | Freq: Every morning | INTRAVENOUS | Status: DC
Start: 2014-07-10 — End: 2014-07-10
  Filled 2014-07-09: qty 5

## 2014-07-09 MED ORDER — SODIUM CHLORIDE 0.9 % INTRAVENOUS SOLUTION
INTRAVENOUS | Status: DC
Start: 2014-07-09 — End: 2014-07-10
  Filled 2014-07-09 (×4): qty 20

## 2014-07-09 MED ORDER — ALBUTEROL SULFATE CONCENTRATE 2.5 MG/0.5 ML SOLUTION FOR NEBULIZATION
5.00 mg | INHALATION_SOLUTION | Freq: Once | RESPIRATORY_TRACT | Status: DC | PRN
Start: 2014-07-09 — End: 2014-07-09

## 2014-07-09 MED ORDER — SODIUM CHLORIDE 0.9 % IV BOLUS
1000.00 mL | INJECTION | Status: AC
Start: 2014-07-09 — End: 2014-07-09
  Administered 2014-07-09: 0 mL via INTRAVENOUS
  Administered 2014-07-09: 1000 mL via INTRAVENOUS

## 2014-07-09 MED ORDER — ESMOLOL 100 MG/10 ML (10 MG/ML) INTRAVENOUS SOLUTION
Freq: Once | INTRAVENOUS | Status: DC | PRN
Start: 2014-07-09 — End: 2014-07-09
  Administered 2014-07-09: 10 mg via INTRAVENOUS

## 2014-07-09 MED ORDER — SODIUM CHLORIDE 0.9 % IV BOLUS
1000.0000 mL | INJECTION | Status: AC
Start: 2014-07-09 — End: 2014-07-09
  Administered 2014-07-09: 0 mL via INTRAVENOUS
  Administered 2014-07-09: 1000 mL via INTRAVENOUS

## 2014-07-09 MED ORDER — ONDANSETRON HCL (PF) 4 MG/2 ML INJECTION SOLUTION
Freq: Once | INTRAMUSCULAR | Status: DC | PRN
Start: 2014-07-09 — End: 2014-07-09
  Administered 2014-07-09: 4 mg via INTRAVENOUS

## 2014-07-09 MED ORDER — PANTOPRAZOLE 40 MG INTRAVENOUS SOLUTION
80.0000 mg | Freq: Once | INTRAVENOUS | Status: DC
Start: 2014-07-09 — End: 2014-07-10
  Administered 2014-07-09: 0 mg via INTRAVENOUS
  Filled 2014-07-09: qty 20

## 2014-07-09 MED ADMIN — Medication: INTRAVENOUS | @ 19:00:00

## 2014-07-09 MED ADMIN — lisinopriL 10 mg tablet: @ 08:00:00

## 2014-07-09 MED ADMIN — potassium chloride 20 mEq/L in 0.9 % sodium chloride intravenous: INTRAVENOUS | @ 23:00:00 | NDC 00409711509

## 2014-07-09 SURGICAL SUPPLY — 9 items
BLOCK BITE 60FR STRD STRAP SDPRT DENTAL RETENTION RIM LF (AIR) ×1
BLOCK BITE 60FR STRD STRAP SDPRT DENTAL RETENTION RIM LF  MAXI DISP (AIR) ×2 IMPLANT
BLOCK BITE 60FR STRD STRAP_SDPRT DENTAL RETENTION RIM LF (AIR) ×1
CONV USE ITEM 306391 - GLOVE SURG 7 LTX 3 PLY PF BEAD_CUF SMTH STRL BRN TINT (GLOVES AND ACCESSORIES) ×2 IMPLANT
GLOVE SURG 7 LTX 3 PLY PF BEAD_CUF SMTH STRL BRN TINT (GLOVES AND ACCESSORIES) ×1
NET SPEC RETR 230CM 2.5MM RTHNT STD SHEATH 6X3CM NONST LF  DISP (Dilators) ×2 IMPLANT
NET SPEC RETR 230CM 2.5MM RTHN_T STD SHTH 6X3CM NONST LF (Dilators) ×1
SYRINGE LL 30ML LF  STRL CONCEN TIP GRAD N-PYRG DEHP-FR MED DISP (NEEDLES & SYRINGE SUPPLIES) ×2 IMPLANT
SYRINGE LL 30ML LF STRL CONCE N TIP GRAD N-PYRG DEHP-FR MED (NEEDLES & SYRINGE SUPPLIES) ×1

## 2014-07-09 NOTE — Nurses Notes (Signed)
Critical H & H results called to this RN 7.9/24.4 and given to pt's primary RN, Marcy Salvo D Sakshi Sermons, RN

## 2014-07-09 NOTE — Progress Notes (Signed)
EGD report under procedures.  Findings no esophageal or gastric varices.Mild ng tube irritation in distal esophagus and fundus. Some retained food in fundus noted .anrtral linear erythema noted.Duodenum normal to 3rd portion.  No visible vessels,no adherant clots and no active or old blood seen on exam.    Will stop octreotide drip,will dc ng tube.Keep npo except for meds. Suggest cardiac consult as pt has prosthetic aortic valve and history of a fib.Pt should have colonoscopy in next 24-72 hours.Continue Protonix drip for now.    Also suggest contrast  Ct abd and pelvis in near future to look at liver in higher detail.Check doppler ultrasound liver tomorrow.

## 2014-07-09 NOTE — Nurses Notes (Signed)
Pt report received from PACU. Pt back to floor and resting in bed; pt laughing and joking. Pt denies all pain and SOB. Vitals and assessment as documented.

## 2014-07-09 NOTE — ED Nurses Note (Signed)
Pt arrives to assigned room and is undressed for exam and placed in hospital gown.  Pt in hospital stretcher with side rails up in lowest position and call bell within reach.  Pt oriented to room, toileting, NPO status, ambulatory status, proper call bell use.  Pt also offered comfort measures (ie. Repositioning, blankets, etc.) Pt on automatic blood pressure device with appropriately sized cuff for routine monitoring. Pt on continuous SPO2 monitoring. Pt placed on cardiac monitor with alarms set.      EMS has IV saline for 100 cc bolus.

## 2014-07-09 NOTE — ED Nurses Note (Signed)
Stool sample walked to lab by this RN

## 2014-07-09 NOTE — Anesthesia Transfer of Care (Signed)
ANESTHESIA TRANSFER OF CARE NOTE                Last Vitals: Temperature: 37 C (98.6 F) (07/09/14 1542)  Heart Rate: (!) 109 (07/09/14 1542)  BP (Non-Invasive): 107/66 mmHg (07/09/14 1542)  Respiratory Rate: (!) 24 (07/09/14 1542)  SpO2-1: 97 % (07/09/14 1542)  Pain Score (Numeric, Faces): 0 (07/09/14 1300)    Patient transferred to PACU in stable condition. Report given to RN.    5/30/2016at 15:52.

## 2014-07-09 NOTE — Anesthesia Preprocedure Evaluation (Addendum)
ANESTHESIA PRE-OP EVALUATION      Airway       Mallampati: II    TM distance: >3 FB    Neck ROM: limited  Mouth Opening: good.            Dental       Dentition intact             Pulmonary    Breath sounds clear to auscultation  (-) no rhonchi, no decreased breath sounds, no wheezes, no rales and no stridor     Cardiovascular    Rhythm: irregular  Rate: Normal  (+) murmur present        Other findings          Planned anesthesia type: general  ASA 4 - emergent     Rapid sequence and Intravenous induction   Patient's NPO status is appropriate for Anesthesia.    Anesthetic plan and risks discussed with patient.    Anesthesia issues/risks discussed are: Stroke, Intraoperative Awareness/ Recall, Post-op Pain Management, PONV, Post-op Intubation/Ventilation and Cardiac Events/MI.                      Functional Status  >4 METs  Anesthesia Hx no issues  NPO status midnight  Allergies none  Preoperative betablocker: did not take this AM  ECG: A fib with a rate of 120  Labs reviewed.      The risks of general anesthesia including but not limited to perioperative MI/CVA/death, post-op intubation/ventilation, PONV, blood loss requiring transfusion, line placement, post-op pain were discussed with the patient. All questions/concerns regarding general anesthesia were addressed prior to proceeding with surgery.     Plan:  Plan for general anesthetic with ETT

## 2014-07-09 NOTE — ED Nurses Note (Signed)
Foley removed and new one inserted with temp probe

## 2014-07-09 NOTE — ED Nurses Note (Signed)
Dr Casimer Leek now at bedside asking about IV access.  I've stated to him that the best access I could obtain was x 2 20 ga and x 1 16 ga PIV.  Dr Casimer Leek is going to get central line supplies.

## 2014-07-09 NOTE — ED Nurses Note (Addendum)
Skin pale, cool, clammy.  Patient alert and oriented x 3.  Glasgow Coma Scale=15. Pt abdomen soft/flat/non-tender.  Bowel sounds hyper active all quadrants.  Pt reports several episodes of frank red diarrhea starting on 07-08-14.  Pt has equal and adequate chest rise and fall.  No crepitus with palpation.  No discoloration.  Pt has clear and equal lung sounds in all fields.  No cyanosis.  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 Pt had positive syncopal episode at home and was checking his BP at night and recorded a sys BP in the 50's.  EMS had <90 sys BP on their arrival and placed the pt in trendelenburg and administered NS bolus of 100 cc.

## 2014-07-09 NOTE — ED Nurses Note (Signed)
Urine sent to POCT

## 2014-07-09 NOTE — Nurses Notes (Signed)
DR. Marta Antu paged with pt's most recent lab work including pt's PT/INR (1.91) and H&H. No further orders at this time.

## 2014-07-09 NOTE — ED Nurses Note (Signed)
Pt is up to ICU bed 15 at [place] via [place].  Pt stopped by CT on the way up to ICU.  Blood picked up for transfusion on the way to ICU.  Dr Casimer Leek placed 9 fr central line prior to leaving bed 12 in ED, which was verified by portable chest-x-ray prior to departure.  Previous to departure pt's wife brought the pt's priest Personal assistant) to bedside for bedside prayer and ritual.  Pt's hypothermia has corrected via the warming blanket Patent attorney).  Pt's BP sys is greater than 100 prior to departure.  Bedside report given to [facility] on the unit. General surgeon at bedside upon arrival to the ICU.  Hospitalist and GI consults have seen the pt in the ED.

## 2014-07-09 NOTE — ED Provider Notes (Signed)
Amberlea Spagnuolo, Alanson Aly, MD  [facility]  Emergency Department Visit Note    Date:  07/09/2014  Primary care provider:  Viona Gilmore, MD  Means of arrival:  ambulance  History obtained from: patient  History limited by: none    Chief Complaint:  Bloody diarrhea     HISTORY OF PRESENT ILLNESS     Ricahrd Weeks, date of birth 03/31/1950, is a 64 y.o. male who presents to the Emergency Department complaining of an acute onset of blood diarrhea and generalized weakness that started at 5 PM yesterday. Pt BP was initially 80/50 and HR of 132. Fingerstick was 168 en route to the ED. Denies any abdominal cramping. Total of 5-6 episodes of blood diarrhea since 5 PM yesterday. Pt currently on coumadin for a-fib. Pt had hydrocele drained 1 month ago and in the last month lost 50 lbs with Coreg and Lasix. Pt had 2 episodes of syncope this morning.     REVIEW OF SYSTEMS     The pertinent positive and negative symptoms are as per HPI. All other systems reviewed and are negative.     PATIENT HISTORY     Past Medical History:  Past Medical History   Diagnosis Date   . Marfan syndrome    . A-fib    . Congestive heart failure    . Diabetes mellitus      type ii diet controlled   . Wears glasses    . Thyroid disease      hypothyroid   . Reflux    . Hydrocele        Past Surgical History:  Past Surgical History   Procedure Laterality Date   . Hx heart valve surgery  1999       Family History:  No family history on file.    Social History:  History   Substance Use Topics   . Smoking status: Never Smoker    . Smokeless tobacco: Never Used   . Alcohol Use: No     History   Drug Use No     Medications:  Previous Medications    CARVEDILOL (COREG) 12.5 MG ORAL TABLET    Take 1 Tab (12.5 mg total) by mouth Twice daily with food    FUROSEMIDE (LASIX) 40 MG ORAL TABLET    Take 40 mg by mouth Three times a day     LEVOTHYROXINE (SYNTHROID) 200 MCG ORAL TABLET    take 200 mcg by mouth Once a day.      MAGNESIUM CARBONATE ORAL    Take by  mouth Once a day    POTASSIUM GLUCONATE ORAL    Take by mouth Once a day    SPIRONOLACTONE (ALDACTONE) 50 MG ORAL TABLET    Take 1 Tab (50 mg total) by mouth Once a day    WARFARIN (COUMADIN) 7.5 MG ORAL TABLET    Take 7.5 mg by mouth Once a day 7.5 mg Monday- Friday and 10 mg Saturday and Sunday     Allergies:  No Known Allergies    PHYSICAL EXAM     Vitals:   07/09/14 1015   BP: 125/48   Pulse: 110   Temp: 34.9 C (94.8 F)   Resp: 18   SpO2: 99%       Pulse ox  99% on None (Room Air) interpreted by me as: Normal    General: Obese male in no acute distress. Pale.   Eyes: Conjunctiva clear. Extra-ocular muscles  intact.  HENT: Normocephalic, atraumatic, mucous membranes dry.   Neck: Supple with no evidence of JVD.  Lungs: Clear to auscultation bilaterally. There is no stridor.  Cardiovascular: S1-S2 regular rate, regular rhythm without murmur, click, gallop, or rub.  Abdomen: Soft, bowel sounds normal and no hepatosplenomegaly. No tenderness to palpation.   Extremities: No cyanosis or edema and no synovitis or joint effusions. Chronic skin changes to bilateral lower extremities.   Skin: Pale. Skin warm and dry.  Vascular: Normal brisk capillary refill less than 2 seconds.  Neurologic: Alert. No evidence of meningismus, moves extremities with symmetry.  Lymphatics: No lymphadenopathy.  Psych: Alert & Oriented. No suicidal or homicidal ideations, normal mood and affect.    DIFFERENTIAL DIAGNOSES     1. GI bleed     DIAGNOSTIC STUDIES     Labs:    Results for orders placed or performed during the hospital encounter of 07/09/14   AMYLASE   Result Value Ref Range    AMYLASE 39 0 - 100 U/L   CBC   Result Value Ref Range    WBC 9.3 4.0 - 11.0 K/uL    RBC 3.07 (L) 4.40 - 5.80 M/uL    HGB 6.3 (LL) 13.5 - 18.0 g/dL    HCT 21.1 (LL) 40.0 - 50.0 %    MCV 68.8 (D) 82.0 - 97.0 fL    MCH 20.6 (L) 28.3 - 34.3 pg    MCHC 30.0 (L) 32.0 - 36.0 g/dL    RDW 19.4 (U) 10.5 - 14.5 %    PLATELET COUNT 252 (U) 150 - 450 K/uL    MPV 8.5 7.4 -  10.5 fL    NRBC 0 0 - 0.6 /100 WBC    NRBC ABSOLUTE 0.00 0 - 0.02 K/uL    PMN % 81.9 (H) 43.0 - 76.0 %    LYMPHOCYTE % 11.8 (L) 15.0 - 43.0 %    MONOCYTE % 5.5 4.8 - 12.0 %    EOSINOPHIL % 0.1 0.0 - 5.2 %    BASOPHILS % 0.7 0.0 - 2.5 %    PMN # 7.6 (H) 1.5 - 6.5 K/uL    LYMPHOCYTE # 1.1 0.7 - 3.2 K/uL    MONOCYTE # 0.5 0.2 - 0.9 K/uL    EOSINOPHIL # 0.0 0.0 - 0.5 K/uL    BASOPHIL # 0.1 0.0 - 0.1 K/uL   AMMONIA   Result Value Ref Range    AMMONIA 10 0 - 35 umol/L   COMPREHENSIVE METABOLIC PROFILE - [facility] ONLY   Result Value Ref Range    GLUCOSE 190 (H) 70 - 110 mg/dL    BUN 29 (U) 6 - 22 mg/dL    CREATININE 1.49 (H) 0.72 - 1.30 mg/dL    ESTIMATED GLOMERULAR FILTRATION RATE 47 (L) >60 ml/min    SODIUM 133 (L) 136 - 145 mmol/L    POTASSIUM 4.3 3.5 - 5.0 mmol/L    CHLORIDE 105 101 - 111 mmol/L    CARBON DIOXIDE 23 (U) 22 - 32 mmol/L    ANION GAP 5 3 - 11 mmol/L    CALCIUM 8.3 (L) 8.5 - 10.5 mg/dL    TOTAL PROTEIN 5.5 (D) 6.0 - 8.0 g/dL    ALBUMIN 3.0 (L) 3.2 - 5.0 g/dL    ALBUMIN/GLOBULIN RATIO 1.2     BILIRUBIN, TOTAL 1.0 0.0 - 1.3 mg/dL    AST (SGOT) 20 0 - 45 IU/L    ALT (SGPT) 15 0 - 63 IU/L  ALKALINE PHOSPHATASE 40 35 - 120 IU/L   PT/INR   Result Value Ref Range    PROTHROMBIN TIME 115.7 (U) 9.4 - 12.5 sec    INR 10.27 (HH)    LIPASE   Result Value Ref Range    LIPASE 20 0 - 60 U/L   TROPONIN-I   Result Value Ref Range    TROPONIN-I <0.03 0.00 - 0.06 ng/mL   URINALYSIS W/REFLEX TO CULTURE - [facility] ONLY   Result Value Ref Range    SOURCE, URINE CC     COLOR,URINE YELLOW YELLOW    APPEARANCE,URINE CLEAR CLEAR    GLUCOSE, URINE NEGATIVE NEGATIVE mg/dL    BILIRUBIN,URINE NEGATIVE NEGATIVE mg/dL    KETONES,URINE NEGATIVE NEGATIVE mg/dL    SPECIFIC GRAVITY,URINE 1.017 <1.022    BLOOD,URINE NEGATIVE NEGATIVE mg/dL    PH,URINE 5.0 <8.0    PROTEIN, URINE, RANDOM NEGATIVE NEGATIVE mg/dL    UROBILINOGEN, URINE <2.0 <2.0 mg/dL    NITRITES,URINE NEGATIVE NEGATIVE    LEUKOCYTE ESTERASE,URINE NEGATIVE NEGATIVE LEU/uL     WBC,URINE 0-2 0 - 2 /hpf    RBC,URINE 0-2 0 - 3 /hpf    SQUAMOUS EPITHELIAL CELLS,UR NONE 1 - 3 /hpf    BACTERIA,URINE SLIGHT (A) NONE    MUCUS,URINE SLIGHT (A) NONE    WBC CLUMPS,URINE PRESENT (A) NONE /hpf    HYALINE CAST,URINE 20-30 (A) 0 - 2 /lpf   LACTIC ACID   Result Value Ref Range    LACTIC ACID 2.6 (H) 0.5 - 2.2 mmol/L   MORPHOLOGY SCAN - [facility] ONLY   Result Value Ref Range    PLATELET ESTIMATE ADEQUATE ADEQUATE    POLYCHROMASIA 1+     HYPO 1+     POIKILOCYTOSIS 1+     ANISOCYTOSIS 1+     MICROCYTOSIS 2+    PRODUCT: FRESH FROZEN PLASMA - [facility] ONLY   Result Value Ref Range    BLOOD COMPONENT TYPE       ISSUED: [id number] THAW PLASMA FROZEN IN 24 HRS  Type B POSITIVE  ISSUED: [id number] THAW PLASMA FROZEN IN 24 HRS  Type B POSITIVE  ISSUED: [id number] THAW PLASMA FROZEN IN 24 HRS  Type B NEGATIVE  ISSUED: [id number] THAW PLASMA FROZEN IN 24 HRS  Type B POSITIVE     ABO & RH   Result Value Ref Range    ABO/RH(D) B POSITIVE    TYPE AND SCREEN - [facility] ONLY   Result Value Ref Range    ABO/RH(D) B POSITIVE     ANTIBODY SCREEN NEGATIVE    TYPE AND CROSS RED CELLS - [facility] ONLY   Result Value Ref Range    BLOOD COMPONENT TYPE       ISSUED: [id number] LEUKOREDUCED PACK CELL AS1  Type B POSITIVE  ISSUED: [id number] LEUKOREDUCED PACK CELL AS1  Type B POSITIVE  ISSUED: [id number] LEUKOREDUCED PACK CELL AS1  Type B POSITIVE       Labs reviewed and interpreted by me.    Radiology:    XR CHEST AP PORTABLE: Negative Normal. No acute findings. No evidence of dissection.   Radiological study interpreted by radiologist and independently reviewed by me.     CT CHEST ABDOMEN PELVIS WO IV CONTRAST: Pending at admission.   Radiological imaging interpreted by radiologist and independently reviewed by me.    EKG:  12 lead EKG interpreted by me shows atrial fibrillation with RVR, rate of 123 bpm, Normal intervals. Normal axis. ST/T wave abnormality.     Emergency  Department Procedure:  Central Line  Placement  Procedure performed at: 1100  Local Anesthesia: Lidocaine Plain, 1%  Skin was prepped with: Betadine  Location: Right, Internal jugular  Device: Triple Lumen Catheter lumen 9 french  Procedural Documentation: Skin was prepped and draped, Sterile technique was used, Blood easily withdrawn, Syringe was removed, Guidewire was advanced, Using blade an incision  was made in the skin, Dilator was advanced and then removed, Easily advanced, Catheter advanced over guidewire, Guidewire was removed, Flushed lumen with Normal Saliine, IV fluids flowed freely through the catheter, Catheter was sutured into place, Sterile dressing was applied  Ultrasound Guidance Required?: No  F/U CXR: Good line placement  Comments: Triple    ED PROGRESS NOTE / [facility] [place] records reviewed by me:  I have reviewed the patient's problem list and the nurse's notes.     Orders Placed This Encounter   . STOOL CULTURE - [facility] ONLY   . OVA & PARASITES W/TRICHROME - [facility] ONLY   . GIARDIA EIA - [facility] ONLY   . CRYPTOSPORIDIUM ANTIGEN - [facility] ONLY   . XR CHEST AP PORTABLE   . CT CHEST ABDOMEN PELVIS WO IV CONTRAST   . XR CHEST AP PORTABLE   . AMYLASE   . CBC   . AMMONIA   . COMPREHENSIVE METABOLIC PROFILE - [facility] ONLY   . PT/INR   . LIPASE   . TROPONIN-I   . URINALYSIS W/REFLEX TO CULTURE - [facility] ONLY   . Lactic Acid   . H & H   . CBC W/ AUTOMATED DIFF - [facility] ONLY   . COMPREHENSIVE METABOLIC PROFILE - [facility] ONLY   . PROTHROMBIN TIME, THERAPEUTIC - [facility] ONLY   . MORPHOLOGY SCAN - [facility] ONLY   . CDIFF BY PCR - [facility] ONLY   . PT/INR   . CBC   . ECG 12-LEAD   . PRODUCT: FRESH FROZEN PLASMA - [facility] ONLY   . ABO & RH   . TYPE AND SCREEN - [facility] ONLY   . TYPE AND CROSS RED CELLS - [facility] ONLY   . TYPE AND CROSS RED CELLS - [facility] ONLY   . Type and Cross Red Cells   . TYPE AND CROSS RED CELLS - [facility] ONLY   . INSERT & MAINTAIN PERIPHERAL IV ACCESS   . NS bolus infusion 1,000 mL   . phytonadione  (vitamin K1) (VITAMIN K) 5 mg in D5W 50 mL IVPB   . NS bolus infusion 1,000 mL   . NS premix infusion   . pantoprazole (PROTONIX) injection   . pantoprazole (PROTONIX) 80 mg in NS 100 mL (tot vol) infusion   . pantoprazole (PROTONIX) injection   . pantoprazole (PROTONIX) 80 mg in NS 100 mL (tot vol) infusion     7:43 AM - I performed my initial evaluation and physical examination at this time. The patient was initially treated with IV vit K and IV Protonix. I am going to order chest XR, abdomen CT, EKG, and lab work for further evaluation to determine the treatment plan. All questions and concerns were answered to his satisfaction and he agrees with the plan.     8:00 PM - NG tube placed at this time by nursing staff. Scant blood visualized.     8:08 AM - Consulted blood bank a this time. Consent for transfusion obtained at this time.     8:35 PM -  I was notified by the nursing staff  that pt H&H is 6.3 and 21.1. INR is 10.27.    9:40 AM - I was notified by the nursing staff that pt had significant blood loss from his rectum. I was notified by the nursing staff that pt has become hypothermic. Bare hugger applied a this time.     CONSULTATION:  Paged Time: 9:00 AM & 9:30 AM & 9:38 AM  Call Time: 9:43 AM  Services Counseled: Admission   Comment: Discussed the patient's case with Dr.Kurapaty (Hospitalist) and she is making arrangements for admission at this time.     9:48 AM - 3 more units ordered at this time. Repeat INR and CBC ordered at this time.     10:01 AM - I have attempted to contact blood bank multiple times to obtain more blood and FFP for pt.     10:04 AM - Temp probe foley cath inserted at this time.     10:05 AM - Dr.Kurapaty at bedside at this time. Will consult General surgery at this time.     CONSULTATION:  Paged Time: 10:12 AM  Call Time: 11:20 AM  Services Counseled: Consult   Comment: Discussed the patient's case with Dr.Cicchino (General surgery) and he knows about the pt and will follow in  consult.      10:14 AM -  I was notified by the nursing staff that pt has another episode of bloody stool.     11:00 AM - IJ Performed at this time please refer to procedure note above.     Total Critical Care Time for this patient is 120 minutes and it is exclusive of procedural time.    Pre-Disposition Vitals:  Filed Vitals:    07/09/14 1015 07/09/14 1030 07/09/14 1045 07/09/14 1100   BP: 125/48 107/57 122/71 106/59   Pulse: 110 98 104 100   Temp: 34.9 C (94.8 F) 36.3 C (97.3 F) 36.5 C (97.7 F) 36.5 C (97.7 F)   Resp: 18 16 8 17    SpO2: 99% 98% 99% 99%       CLINICAL IMPRESSION     Encounter Diagnoses   Name Primary?   . GIB (gastrointestinal bleeding) Yes   . Hemorrhagic shock    . Atrial fibrillation with rapid ventricular response    . Renal failure (ARF), acute on chronic      DISPOSITION/PLAN     Admitted to ICU per Dr.Kurapaty     Condition at Disposition: [place], SCRIBE scribed for Monna Fam, MD on 07/09/2014 at 7:43 AM.     Documentation assistance provided for Jabria Loos, Alanson Aly, MD  by Dalbert Mayotte, [place]. Information recorded by the scribe was done at my direction and has been reviewed and validated by me Jushua Waltman, Alanson Aly, MD.

## 2014-07-09 NOTE — ED Nurses Note (Signed)
Charge nurse Claiborne Billings arrives at bedside to inform RN Joe that at this hospital you may not transfuse 2 units at once even in the critical patient setting with active blood loss.  I then clamped unit number [id number] and continued running unit number [id number].  The pt is symptomatic to blood loss.  Pt has positive upper and lower bleeding via NGT and a greater than 500 cc amount of frank red blood out of his rectum within the past 45 minutes.  Pt's hbg prior to starting transfusion is also less than 7.  The pt is also now hypothermic and a bair hugger is applied.  Dr Casimer Leek called to bedside for guidance on pt's blood loss and hypothermia.  There are no new orders placed.  Scott EDT asked for changing of his foley catheter

## 2014-07-09 NOTE — Nurses Notes (Signed)
Per Kayren Eaves, medical examiner, pt's case declined. [place] notified.

## 2014-07-09 NOTE — Consults (Signed)
[facility]                                  [place], [place] [address]                                     [phone]                             CONSULTATION    PATIENT NAME: Kenneth Weeks, Kenneth Weeks [facility] NUMBER:[id number]  DATE OF SERVICE:07/09/2014  DATE OF BIRTH: 14-Feb-1950    SURGICAL CONSULTATION    REASON FOR CONSULTATION:  Acute gastrointestinal bleed.    HISTORY OF PRESENT ILLNESS:  This is a 64 year old male who was admitted with an acute gastrointestinal bleed, hypotensive on arrival to the Emergency Department this morning, systolic blood pressure 80, heart rate 130s.  Hemoglobin was 6.3.  It was repeated and came back at 5.1.  The patient is on Coumadin.  His INR was 10.  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  The patient states that approximately 5 o'clock last evening he noticed a large amount of bright red blood from his rectum, no history of gross rectal bleeding.  Occasionally over the past several years he has had some minor blood with wiping.  He has never had a colonoscopy.  At this time, he is currently in intensive care unit.  He has a central venous line in place.  He has received blood.  Systolic blood pressure is in the 130s and heart rate 100 at this time.  He is awake and alert.      The patient states approximately a month ago he had a hydrocele drained and injected.  He states he gained about 30-40 pounds of fluid with swelling of his lower extremities.  Following the procedure, he states a diuretic was added to his routine medications and he lost approximately 50 pounds over the past couple of weeks.  He has had no melena.  He was a little constipated several days ago and took prune juice.  No abdominal pain, melena, no gross rectal bleeding until 5:00 last evening.    PAST MEDICAL HISTORY:  Significant for Marfan syndrome, history of atrial  fibrillation, congestive heart failure, hypothyroidism, reflux.    PAST SURGICAL HISTORY:  Heart valve replacement in 1999, hydrocele was drained approximately a month ago.    SOCIAL HISTORY:  No tobacco.  Rare social alcohol intake.    MEDICATIONS:  Coreg, Lasix, Synthroid, magnesium, Aldactone, Coumadin.    ALLERGIES:  No known drug allergies.    REVIEW OF SYSTEMS:  As above, significant for Marfan syndrome, diabetes which is diet controlled, and a history of atrial fibrillation.  No history of chest pain.  He has never had a colonoscopy.  He states he has no family history of colon cancer.    PHYSICAL EXAMINATION:  He is alert, awake, oriented to person, place and time.  He appears in no acute distress.    HEENT:  Pupils are equal and reactive to light.  There  is no jaundice, no scleral icterus.  Tongue is moist and midline.  No jugular venous distention.   Trachea is midline.    HEART:  Tachycardic.    LUNGS:  Clear.    ABDOMEN:  Soft, nontender, no guarding, no rebound, no masses, no history of abdominal surgeries in the past.  There are no surgical scars on his abdomen.    EXTREMITIES:  No edema or cyanosis.    RECTAL:  Prolapsed hemorrhoids.    IMPRESSION AND PLAN:  Acute gastrointestinal bleed.  The patient is on Coumadin.  His INR is 10.  Dr. Lovett Calender has been consulted and has seen the patient.  The patient had been transfused and systolic blood pressure and heart rate are improving.  I agree with holding the patient's Coumadin.  Need to normalize his INR.  Continue to follow the patient's hemoglobin and hematocrit.  For possible upper and lower endoscopy by gastroenterology once the INR has normalized.  To continue to follow the patient's hemoglobin and hematocrit and transfuse as needed.  At this point, there is no need for surgical intervention at this time.  I will follow the patient with you.    Thank you for the consultation.      Tyson Dense, Brooke Bonito, DO      TU/UE/[id number]; D: 07/09/2014 17:91:50;  T: 07/09/2014 13:57:46    cc: Shane Crutch MD      Suanne Marker

## 2014-07-09 NOTE — Nurses Notes (Signed)
Pt down to OR for EGD

## 2014-07-09 NOTE — Consults (Signed)
[facility]  Digestive Diseases Consult      Kenneth, Weeks, 64 y.o. male  Date of Admission:  07/09/2014  Date of service: 07/09/2014  Date of Birth:  03-16-1950    Hospital Day:      Service:ER/MedicineRequesting MD: Dr.Meske  Information Obtained from: patient and history reviewed via medical record  Chief Complaint:  Bloody diarrhea/Coumadin excess  Assessment/Recommendations: Correct protime ,trnasfuse to get hemoglobin 8.IV fluid.NPO except for essential medications.Follow hemoglobin every 6 hours.IV Protonix bolus and drip. Check ct chest abd and pelvis for pathology to help explain bleeding  source.Consider EGD and colonoscopy when protime and hemoglobin corrected and improved. Check stool culures.  HPI/Discussion:  Kenneth Weeks is a 64 y.o., White male who presents with bloody diarrhea.Microcytic anemia suggest more chronic blood loss as well.on top of acute blood loss at present.INR very high.Pt has lost a lot of water weight with lasix,recently..Pt denies recent melena or brbpr.Occasional constipation and hematochezia which he attributes to external hemorrhoids.No etoh abuse.Pt without significant gi bleedin past.Pt never had EGD or colonoscopy.Pt had syncope x 2 prior to arrival in er.IN Er small amount of gastric aspirate noted with pink tinge.Pt had 1 episode of dry heave this am as well.    Past Medical History   Diagnosis Date   . Marfan syndrome    . A-fib    . Congestive heart failure    . Diabetes mellitus      type ii diet controlled   . Wears glasses    . Thyroid disease      hypothyroid   . Reflux    . Hydrocele          Past Surgical History   Procedure Laterality Date   . Hx heart valve surgery  1999     [facility]          Medications Prior to Admission     Prescriptions    carvedilol (COREG) 12.5 mg Oral Tablet    Take 1 Tab (12.5 mg total) by mouth Twice daily with food    furosemide (LASIX) 40 mg Oral Tablet    Take 40 mg by mouth Three times a day     levothyroxine (SYNTHROID) 200 mcg Oral  Tablet    take 200 mcg by mouth Once a day.      MAGNESIUM CARBONATE ORAL    Take by mouth Once a day    POTASSIUM GLUCONATE ORAL    Take by mouth Once a day    spironolactone (ALDACTONE) 50 mg Oral Tablet    Take 1 Tab (50 mg total) by mouth Once a day    warfarin (COUMADIN) 7.5 mg Oral Tablet    Take 7.5 mg by mouth Once a day 7.5 mg Monday- Friday and 10 mg Saturday and Sunday          Current Facility-Administered Medications:  NS premix infusion  Intravenous Continuous   pantoprazole (PROTONIX) 80 mg in NS 100 mL (tot vol) infusion  Intravenous Now   pantoprazole (PROTONIX) injection 80 mg Intravenous Once     No Known Allergies    Family History  No family history on file.      Social History  History     Social History   . Marital Status: Married     Spouse Name: N/A   . Number of Children: N/A   . Years of Education: N/A     Occupational History   . Not on file.  Social History Main Topics   . Smoking status: Never Smoker    . Smokeless tobacco: Never Used   . Alcohol Use: No   . Drug Use: No   . Sexual Activity: Not on file     Other Topics Concern   . Not on file     Social History Narrative       ROS:     Constitutional: positive for fatigue  Eyes: negative  Ears, nose, mouth, throat, and face: negative  Respiratory: negative  Cardiovascular: negative  Gastrointestinal: positive for bloody diarrhea and clots /occasional constipation, negative for dysphagia, odynophagia, dyspepsia, nausea, melena, abdominal pain and jaundice  Genitourinary:negative  Integument/breast: negative  Hematologic/lymphatic: positive for coumadin use  Musculoskeletal:negative  Neurological: negative  Behavioral/Psych: negative  Endocrine: negative  Allergic/Immunologic: negative      EXAM:     General: acutely ill  Eyes: Conjunctiva clear.  HENT:Head atraumatic and normocephalic  Neck: supple, symmetrical, trachea midline  Lungs: Clear to auscultation bilaterally. , anteriorly  Cardiovascular: regular rate and rhythm  Abdomen:  Soft, non-tender, Bowel sounds normal, non-distended    Extremities: No cyanosis or edema  Skin: Skin warm and dry,stasis pigment  chages on legs bilaterally  Neurologic: Grossly normal    Psychiatric: Normal    Labs:  I have reviewed all lab results.    Imaging Studies:  N/A    Eudelia Bunch, MD 07/09/2014, 09:23

## 2014-07-09 NOTE — Progress Notes (Signed)
CT read as suggestive of cirrhosis.As a result will proceed with EGD as if cause for bleeding is from esophageal varices they need to be banded.Will place pt on Octreotide drip and start IV Rocephin.

## 2014-07-09 NOTE — H&P (Signed)
[facility]  [place], [place] [id number]    Admission H&P    Date of Service:  07/09/2014  Kenneth Weeks, Kenneth Weeks, 64 y.o. male  Date of Admission:  07/09/2014  Date of Birth:  11/18/1950  PCP: Viona Gilmore, MD    Information Obtained from: patient, spouse and health care provider     Chief Complaint:  Rectal bleeding.     HPI: Kenneth Weeks is a 64 y.o., White male who presents with Acute onset of rectal bleeding with bloody diarrhea and generalized weakness that started at 5:00 p.m. yesterday.  The patient's initial blood pressure was 80/50 and heart rate was 132.  Finger- stick blood sugar was 168 and O2 EGD.  He was having 5-6 episodes of blood with blood clots with loose stool-like appearance, but it is frank blood.  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    He denies any abdominal cramping associated with it.  History of hydroceles during a month ago and lost 50 pounds of weight recently.    He had 2 episodes of syncope this morning prior to coming here.    REVIEW OF SYSTEMS:  Constitutional -- no fevers, but in the Emergency Department, he was found to be hypothermic with hypotension.  Head and ENT symptoms pertinently negative.  Cardiovascular -- no chest pain, paroxysmal nocturnal dyspnea, or orthopnea.  No shortness of breath, no cough.  Gastrointestinal - no vomiting, but active bloody stools, actually without any stool, just active gastrointestinal bleeding going on with blood clots being passed with several bowel movements in succession while in the Emergency Department.  Urinary symptoms -- no history of any previous symptoms of urinary tract infection, burning micturition, or frequency.  Neurological -- dizziness as above, syncope x 2 as above.  All other systems were reviewed and are negative.        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        PAST MEDICAL:    Past Medical History   Diagnosis Date    Marfan syndrome     A-fib     Congestive heart failure     Diabetes mellitus      type ii diet controlled    Wears glasses     Thyroid disease      hypothyroid    Reflux     Hydrocele         Past Surgical History   Procedure Laterality Date    Hx heart valve surgery  1999     st judes             Medications Prior to Admission     Prescriptions    carvedilol (COREG) 12.5 mg Oral Tablet    Take 1 Tab (12.5 mg total) by mouth Twice daily with food    furosemide (LASIX) 40 mg Oral Tablet    Take 40 mg by mouth Three times a day     levothyroxine (SYNTHROID) 200 mcg Oral Tablet    take 200 mcg by mouth Once a day.      MAGNESIUM CARBONATE ORAL    Take by mouth Once a day    POTASSIUM GLUCONATE ORAL    Take  by mouth Once a day    spironolactone (ALDACTONE) 50 mg Oral Tablet    Take 1 Tab (50 mg total) by mouth Once a day    warfarin (COUMADIN) 7.5 mg Oral Tablet    Take 7.5 mg by mouth Once a day 7.5 mg Monday- Friday and 10 mg Saturday and Sunday        No Known Allergies    Family History  No family history on file.        Social History  History     Social History    Marital Status: Married     Spouse Name: N/A    Number of Children: N/A    Years of Education: N/A     Occupational History    Not on file.     Social History Main Topics    Smoking status: Never Smoker     Smokeless tobacco: Never Used    Alcohol Use: No    Drug Use: No    Sexual Activity: Not on file     Other Topics Concern    Not on file     Social History Narrative           Examination:                        GENERAL: Patient is alert, awake and oriented X 3. He is obese. He is currently hypotensive and hypothermic in ED. At the time I saw him no vitals were documented in the computer. I have reviewed subsequent onces  HEENT: PERRLA,  EOMI.   NECK: Supple, NO JVD.   HEART: S1+, S2+, rate controlled and rhythm regular. Metal valve click heard.    LUNGS: Clear to auscultation bilaterally.   ABDOMEN: Soft, NT, ND with NABS.   EXTREMITIES:  Patient with chronic leg edema, has chronic venous stasis with hyperpigmentation of skin,  pedal pulses palpable.   NEURO:  Motor exam is non focal.   SKIN: No rashes or bruises.   PSYCH: Patient is awake, oriented X 3,  with no signs of depression. On the bed pan with another bout of rectal bleeding, which is active with blood clots.         Labs:    Results for orders placed or performed during the hospital encounter of 07/09/14 (from the past 12 hour(s))   AMYLASE   Result Value Ref Range    AMYLASE 39 0 - 100 U/L   CBC   Result Value Ref Range    WBC 9.3 4.0 - 11.0 K/uL    RBC 3.07 (L) 4.40 - 5.80 M/uL    HGB 6.3 (LL) 13.5 - 18.0 g/dL    HCT 21.1 (LL) 40.0 - 50.0 %    MCV 68.8 (D) 82.0 - 97.0 fL    MCH 20.6 (L) 28.3 - 34.3 pg    MCHC 30.0 (L) 32.0 - 36.0 g/dL    RDW 19.4 (U) 10.5 - 14.5 %    PLATELET COUNT 252 (U) 150 - 450 K/uL    MPV 8.5 7.4 - 10.5 fL    NRBC 0 0 - 0.6 /100 WBC    NRBC ABSOLUTE 0.00 0 - 0.02 K/uL    PMN % 81.9 (H) 43.0 - 76.0 %    LYMPHOCYTE % 11.8 (L) 15.0 - 43.0 %    MONOCYTE % 5.5 4.8 - 12.0 %    EOSINOPHIL % 0.1 0.0 - 5.2 %    BASOPHILS %  0.7 0.0 - 2.5 %    PMN # 7.6 (H) 1.5 - 6.5 K/uL    LYMPHOCYTE # 1.1 0.7 - 3.2 K/uL    MONOCYTE # 0.5 0.2 - 0.9 K/uL    EOSINOPHIL # 0.0 0.0 - 0.5 K/uL    BASOPHIL # 0.1 0.0 - 0.1 K/uL   AMMONIA   Result Value Ref Range    AMMONIA 10 0 - 35 umol/L   COMPREHENSIVE METABOLIC PROFILE - [facility] ONLY   Result Value Ref Range    GLUCOSE 190 (H) 70 - 110 mg/dL    BUN 29 (U) 6 - 22 mg/dL    CREATININE 1.49 (H) 0.72 - 1.30 mg/dL    ESTIMATED GLOMERULAR FILTRATION RATE 47 (L) >60 ml/min    SODIUM 133 (L) 136 - 145 mmol/L    POTASSIUM 4.3 3.5 - 5.0 mmol/L    CHLORIDE 105 101 - 111 mmol/L    CARBON DIOXIDE 23 (U) 22 - 32 mmol/L    ANION GAP 5 3 - 11 mmol/L     CALCIUM 8.3 (L) 8.5 - 10.5 mg/dL    TOTAL PROTEIN 5.5 (D) 6.0 - 8.0 g/dL    ALBUMIN 3.0 (L) 3.2 - 5.0 g/dL    ALBUMIN/GLOBULIN RATIO 1.2     BILIRUBIN, TOTAL 1.0 0.0 - 1.3 mg/dL    AST (SGOT) 20 0 - 45 IU/L    ALT (SGPT) 15 0 - 63 IU/L    ALKALINE PHOSPHATASE 40 35 - 120 IU/L   PRODUCT: FRESH FROZEN PLASMA - [facility] ONLY   Result Value Ref Range    BLOOD COMPONENT TYPE       ISSUED: [id number] THAW PLASMA FROZEN IN 24 HRS  Type B POSITIVE  ISSUED: [id number] THAW PLASMA FROZEN IN 24 HRS  Type B POSITIVE     PT/INR   Result Value Ref Range    PROTHROMBIN TIME 115.7 (U) 9.4 - 12.5 sec    INR 10.27 (HH)    LIPASE   Result Value Ref Range    LIPASE 20 0 - 60 U/L   TROPONIN-I   Result Value Ref Range    TROPONIN-I <0.03 0.00 - 0.06 ng/mL   LACTIC ACID   Result Value Ref Range    LACTIC ACID 2.6 (H) 0.5 - 2.2 mmol/L   ABO & RH   Result Value Ref Range    ABO/RH(D) B POSITIVE    TYPE AND SCREEN - [facility] ONLY   Result Value Ref Range    ABO/RH(D) B POSITIVE     ANTIBODY SCREEN NEGATIVE    TYPE AND CROSS RED CELLS - [facility] ONLY   Result Value Ref Range    BLOOD COMPONENT TYPE       ISSUED: [id number] LEUKOREDUCED PACK CELL AS1  Type B POSITIVE  ISSUED: [id number] LEUKOREDUCED PACK CELL AS1  Type B POSITIVE     MORPHOLOGY SCAN - [facility] ONLY   Result Value Ref Range    PLATELET ESTIMATE ADEQUATE ADEQUATE    POLYCHROMASIA 1+     HYPO 1+     POIKILOCYTOSIS 1+     ANISOCYTOSIS 1+     MICROCYTOSIS 2+    URINALYSIS W/REFLEX TO CULTURE - [facility] ONLY   Result Value Ref Range    SOURCE, URINE CC     COLOR,URINE YELLOW YELLOW    APPEARANCE,URINE CLEAR CLEAR    GLUCOSE, URINE NEGATIVE NEGATIVE mg/dL    BILIRUBIN,URINE NEGATIVE NEGATIVE mg/dL    KETONES,URINE NEGATIVE NEGATIVE mg/dL  SPECIFIC GRAVITY,URINE 1.017 <1.022    BLOOD,URINE NEGATIVE NEGATIVE mg/dL    PH,URINE 5.0 <8.0    PROTEIN, URINE, RANDOM NEGATIVE NEGATIVE mg/dL    UROBILINOGEN, URINE <2.0 <2.0 mg/dL    NITRITES,URINE NEGATIVE NEGATIVE    LEUKOCYTE  ESTERASE,URINE NEGATIVE NEGATIVE LEU/uL    WBC,URINE 0-2 0 - 2 /hpf    RBC,URINE 0-2 0 - 3 /hpf    SQUAMOUS EPITHELIAL CELLS,UR NONE 1 - 3 /hpf    BACTERIA,URINE SLIGHT (A) NONE    MUCUS,URINE SLIGHT (A) NONE    WBC CLUMPS,URINE PRESENT (A) NONE /hpf    HYALINE CAST,URINE 20-30 (A) 0 - 2 /lpf       EKG:  12 lead EKG interpreted by me shows atrial fibrillation with RVR, rate of 123 bpm, Normal intervals. Normal axis. ST/T wave abnormality.      Imaging Studies:    Results for orders placed or performed during the hospital encounter of 07/09/14 (from the past 24 hour(s))   XR CHEST AP PORTABLE     Status: None (In process)    Narrative    RADIOLOGIST: Theressa Millard, MD/pb     CHEST, ONE VIEW     REASON FOR EXAMINATION:  Abdominal pain, anal bleeding.     FINDINGS:  The lungs are clear.  Mild cardiomegaly.  Evidence of previous median sternotomy.  Nasogastric tube is normally present.       Impression       Satisfactory appearance of the lungs.                DNR Status:  No Order    Assessment/Plan:   Active Hospital Problems   (*Primary Problem)    Diagnosis    Acute GI bleeding       DVT/PE Prophylaxis: Not indicated/ INR is at 10 now.       ASSESSMENT AND PLAN:  1.  The patient with acute gastrointestinal bleeding of sudden onset, since yesterday, with severely elevated coagulation profiles.  Acute gastrointestinal bleeding is likely lower gastrointestinal, mostly diverticular bleed is suspected. He has no pain when he passes bloody stool.  2.  Severe coagulopathy with high INR of about 10.  Coumadin is held.  Vitamin K was given, fresh frozen plasma is being given right now, and additional fresh frozen plasma will need to be given after INR is monitored. Will give FFP as needed.     3.  Hypotension secondary to likely hemorrhagic shock.  Will hold all of the antihypertensives at this moment including the beta-blocker, but will reinstate the beta-blocker as soon as hypotension is treated.    4.  Chronic atrial  fibrillation with mild rapid ventricular response due to acute hemodynamic instability.  5.  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  6.  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  7.  Hypothermia, warming blankets placed.  8.  Gastroesophageal reflux disease.  9.  Type 2 diabetes mellitus, uncontrolled, with hyperglycemia.  10.  Hypothyroidism, on thyroid supplement.    PLAN OF CARE:  As mentioned earlier, his warfarin and spironolactone will be held, Synthroid would likely need to be given through IV due to the fact that he will be nothing by mouth, and Coreg will be held due to low blood pressure.   The reason for hypothermia is not clear, but likely is due to low blood pressure and bleeding and he may need warming blankets.  Foley catheter  temperature probe was placed so that we can monitor core temperature to guide further treatment.    The patient will be admitted to the intensive care unit as soon as he stabilized.  Gastroenterology consultation was discussed with Dr. Lovett Calender.  Dr. Rudy Jew will be called by Dr. Casimer Leek, who is in the Emergency Department, to have him as a surgical backup in case the patient needs emergent surgical intervention.  Fresh frozen plasma, packed red cells will need to be given as necessary due to ongoing active gastrointestinal bleeding.  Protonix bolus and drip were started, which will be continued.      I did discuss the current care plan with the patient's wife, who is at bedside, and currently his code status is a full code per the patient and his wife, who are competent and very acutely ill at this point.  His prognosis is guarded.        Critical care time spent with his medical management is 60 minutes.            Portions of this note may be dictated using voice recognition software or a dictation service. Variances in spelling and vocabulary are possible and unintentional. Not all errors are  caught/corrected. Please notify the [facility] if any discrepancies are noted or if the meaning of any statement is not clear.

## 2014-07-09 NOTE — Nurses Notes (Signed)
Pt arrived to unit and transferred to bed. Oriented to unit. Vitals stable as documented. Pt a.fib 90's - 100's on monitor. 1 unit of PRBCs started. Pt denies all SOB and pain. Assessment as documented. No s/s distress. Bedside report received.

## 2014-07-10 ENCOUNTER — Inpatient Hospital Stay (HOSPITAL_BASED_OUTPATIENT_CLINIC_OR_DEPARTMENT_OTHER): Payer: Medicare Other

## 2014-07-10 ENCOUNTER — Inpatient Hospital Stay (HOSPITAL_COMMUNITY): Payer: Medicare Other

## 2014-07-10 ENCOUNTER — Encounter (HOSPITAL_BASED_OUTPATIENT_CLINIC_OR_DEPARTMENT_OTHER)
Admission: EM | Disposition: A | Discharge status: Other Acute Inpatient Hospital | Payer: Self-pay | Source: Home / Self Care | Attending: INTERNAL MEDICINE

## 2014-07-10 ENCOUNTER — Inpatient Hospital Stay (HOSPITAL_COMMUNITY): Payer: Medicare Other | Admitting: PULMONARY DISEASE

## 2014-07-10 ENCOUNTER — Other Ambulatory Visit (HOSPITAL_COMMUNITY): Payer: Self-pay | Admitting: Gastroenterology

## 2014-07-10 ENCOUNTER — Encounter (HOSPITAL_COMMUNITY)
Admission: EM | Disposition: A | Discharge status: Home Health | Payer: Self-pay | Source: Other Acute Inpatient Hospital | Attending: Surgery

## 2014-07-10 ENCOUNTER — Inpatient Hospital Stay
Admission: EM | Admit: 2014-07-10 | Discharge: 2014-07-18 | DRG: 330 | Disposition: A | Discharge status: Home / Self Care | Payer: Medicare Other | Source: Other Acute Inpatient Hospital | Attending: Surgery | Admitting: Surgery

## 2014-07-10 ENCOUNTER — Encounter (HOSPITAL_BASED_OUTPATIENT_CLINIC_OR_DEPARTMENT_OTHER): Payer: Self-pay

## 2014-07-10 ENCOUNTER — Encounter (HOSPITAL_COMMUNITY): Payer: Self-pay | Admitting: FAMILY PRACTICE

## 2014-07-10 DIAGNOSIS — Q8740 Marfan's syndrome, unspecified: Secondary | ICD-10-CM | POA: Insufficient documentation

## 2014-07-10 DIAGNOSIS — I482 Chronic atrial fibrillation, unspecified: Secondary | ICD-10-CM | POA: Diagnosis present

## 2014-07-10 DIAGNOSIS — E039 Hypothyroidism, unspecified: Secondary | ICD-10-CM | POA: Insufficient documentation

## 2014-07-10 DIAGNOSIS — I10 Essential (primary) hypertension: Secondary | ICD-10-CM | POA: Insufficient documentation

## 2014-07-10 DIAGNOSIS — C182 Malignant neoplasm of ascending colon: Principal | ICD-10-CM | POA: Diagnosis present

## 2014-07-10 DIAGNOSIS — K5730 Diverticulosis of large intestine without perforation or abscess without bleeding: Secondary | ICD-10-CM | POA: Diagnosis present

## 2014-07-10 DIAGNOSIS — C189 Malignant neoplasm of colon, unspecified: Secondary | ICD-10-CM | POA: Diagnosis present

## 2014-07-10 DIAGNOSIS — D374 Neoplasm of uncertain behavior of colon: Secondary | ICD-10-CM

## 2014-07-10 DIAGNOSIS — I517 Cardiomegaly: Secondary | ICD-10-CM

## 2014-07-10 DIAGNOSIS — Q87418 Marfan's syndrome with other cardiovascular manifestations: Secondary | ICD-10-CM

## 2014-07-10 DIAGNOSIS — K922 Gastrointestinal hemorrhage, unspecified: Secondary | ICD-10-CM

## 2014-07-10 DIAGNOSIS — Z5181 Encounter for therapeutic drug level monitoring: Secondary | ICD-10-CM

## 2014-07-10 DIAGNOSIS — Z7901 Long term (current) use of anticoagulants: Secondary | ICD-10-CM

## 2014-07-10 DIAGNOSIS — N5089 Other specified disorders of the male genital organs: Secondary | ICD-10-CM | POA: Diagnosis present

## 2014-07-10 DIAGNOSIS — E119 Type 2 diabetes mellitus without complications: Secondary | ICD-10-CM | POA: Diagnosis present

## 2014-07-10 DIAGNOSIS — N508 Other specified disorders of male genital organs: Secondary | ICD-10-CM | POA: Diagnosis present

## 2014-07-10 DIAGNOSIS — Z952 Presence of prosthetic heart valve: Secondary | ICD-10-CM

## 2014-07-10 DIAGNOSIS — D696 Thrombocytopenia, unspecified: Secondary | ICD-10-CM | POA: Diagnosis present

## 2014-07-10 DIAGNOSIS — K921 Melena: Secondary | ICD-10-CM

## 2014-07-10 DIAGNOSIS — R600 Localized edema: Secondary | ICD-10-CM | POA: Diagnosis present

## 2014-07-10 DIAGNOSIS — I959 Hypotension, unspecified: Secondary | ICD-10-CM | POA: Diagnosis present

## 2014-07-10 DIAGNOSIS — K7460 Unspecified cirrhosis of liver: Secondary | ICD-10-CM | POA: Diagnosis present

## 2014-07-10 DIAGNOSIS — I509 Heart failure, unspecified: Secondary | ICD-10-CM

## 2014-07-10 DIAGNOSIS — R791 Abnormal coagulation profile: Secondary | ICD-10-CM | POA: Diagnosis present

## 2014-07-10 DIAGNOSIS — D62 Acute posthemorrhagic anemia: Secondary | ICD-10-CM

## 2014-07-10 DIAGNOSIS — Z960 Presence of urogenital implants: Secondary | ICD-10-CM

## 2014-07-10 DIAGNOSIS — J9811 Atelectasis: Secondary | ICD-10-CM | POA: Diagnosis present

## 2014-07-10 DIAGNOSIS — Z8249 Family history of ischemic heart disease and other diseases of the circulatory system: Secondary | ICD-10-CM

## 2014-07-10 DIAGNOSIS — I4891 Unspecified atrial fibrillation: Secondary | ICD-10-CM

## 2014-07-10 DIAGNOSIS — E876 Hypokalemia: Secondary | ICD-10-CM | POA: Insufficient documentation

## 2014-07-10 DIAGNOSIS — R918 Other nonspecific abnormal finding of lung field: Secondary | ICD-10-CM | POA: Diagnosis present

## 2014-07-10 HISTORY — DX: Essential (primary) hypertension: I10

## 2014-07-10 LAB — LIPID PANEL
CHOLESTEROL: 74 mg/dL (ref <200)
HDL-CHOLESTEROL: 18 mg/dL — ABNORMAL LOW (ref >39)
LDL (CALCULATED): 33 mg/dL (ref <100)
NON - HDL (CALCULATED): 56 mg/dL (ref <190)
TRIGLYCERIDES: 115 mg/dL (ref <150)
VLDL (CALCULATED): 23 mg/dL (ref <30)

## 2014-07-10 LAB — THYROID STIMULATING HORMONE WITH FREE T4 REFLEX: THYROID STIMULATING HORMONE WITH FREE T4 REFLEX: 0.523 u[IU]/mL (ref 0.350–5.000)

## 2014-07-10 LAB — BASIC METABOLIC PANEL
ANION GAP: 9 mmol/L (ref 4–13)
BUN/CREAT RATIO: 16 (ref 6–22)
BUN: 15 mg/dL (ref 8–25)
CALCIUM: 7.5 mg/dL — ABNORMAL LOW (ref 8.5–10.4)
CARBON DIOXIDE: 22 mmol/L (ref 22–32)
CHLORIDE: 109 mmol/L (ref 96–111)
CREATININE: 0.91 mg/dL (ref 0.62–1.27)
ESTIMATED GLOMERULAR FILTRATION RATE: >59 mL/min/{1.73_m2} (ref >59)
GLUCOSE,NONFAST: 120 mg/dL (ref 65–139)
POTASSIUM: 3.9 mmol/L (ref 3.5–5.1)
SODIUM: 140 mmol/L (ref 136–145)

## 2014-07-10 LAB — HEPATITIS,ACUTE PANEL A,B,C - BMC/JMC ONLY
HCV ANTIBODY: NONREACTIVE
HCV RATIO: 0.06 RATIO (ref 0.00–0.99)
HEPATITIS A IGM AB: NONREACTIVE
HEPATITIS B CORE IGM AB: NONREACTIVE
HEPATITIS B SURFACE AG: NONREACTIVE

## 2014-07-10 LAB — ECG 12-LEAD
Atrial Rate: 85 {beats}/min
Calculated T Axis: 167 degrees
QRS Duration: 90 ms
QT Interval: 344 ms
QTC Calculation: 469 ms
Ventricular rate: 112 {beats}/min

## 2014-07-10 LAB — URINALYSIS MACROSCOPIC WITH REFLEX TO MICROSCOPIC URINALYSIS (CULTURE NOT PERFORMED)
COLOR: NORMAL
SPECIFIC GRAVITY, URINE: 1.019 (ref 1.005–1.030)

## 2014-07-10 LAB — B-TYPE NATRIURETIC PEPTIDE: B-TYPE NATRIURETIC PEPTIDE: 181 pg/mL — ABNORMAL HIGH (ref <100)

## 2014-07-10 LAB — URINALYSIS, MICROSCOPIC
HYALINE CAST: 3 /LPF (ref 0–3)
WBC'S: 3 /HPF (ref <4)

## 2014-07-10 LAB — COMPREHENSIVE METABOLIC PROFILE - BMC/JMC ONLY
ALBUMIN: 2.4 g/dL — ABNORMAL LOW (ref 3.2–5.0)
ALKALINE PHOSPHATASE: 38 IU/L (ref 35–120)
ALT (SGPT): 11 IU/L (ref 0–63)
AST (SGOT): 18 IU/L (ref 0–45)
CALCIUM: 7.5 mg/dL — ABNORMAL LOW (ref 8.5–10.5)
CARBON DIOXIDE: 21 mmol/L — ABNORMAL LOW (ref 22–32)
CREATININE: 1.17 mg/dL (ref 0.72–1.30)
ESTIMATED GLOMERULAR FILTRATION RATE: >60 mL/min (ref >60)
GLUCOSE: 136 mg/dL — ABNORMAL HIGH (ref 70–110)
POTASSIUM: 4.2 mmol/L (ref 3.5–5.0)
SODIUM: 137 mmol/L (ref 136–145)
TOTAL PROTEIN: 4.4 g/dL — ABNORMAL LOW (ref 6.0–8.0)

## 2014-07-10 LAB — URINALYSIS WITH MICROSCOPIC REFLEX IF INDICATED
BILIRUBIN: NEGATIVE
GLUCOSE: NEGATIVE mg/dL
KETONES: 5 mg/dL — AB
NITRITE: NEGATIVE
PH URINE: 6 (ref 5.0–8.0)
PROTEIN: NEGATIVE mg/dL
SPECIFIC GRAVITY, URINE: 1.019 (ref 1.005–1.030)

## 2014-07-10 LAB — CBC W/ AUTOMATED DIFF - BMC ONLY
BASOPHIL #: 0.1 K/uL (ref 0.0–0.1)
BASOPHILS %: 0.8 % (ref 0.0–2.5)
EOSINOPHIL %: 0.5 % (ref 0.0–5.2)
HCT: 19.4 % — CL (ref 40.0–50.0)
HGB: 6.3 g/dL — CL (ref 13.5–18.0)
LYMPHOCYTE #: 1.1 K/uL (ref 0.7–3.2)
LYMPHOCYTE %: 13 % — ABNORMAL LOW (ref 15.0–43.0)
MCH: 24.5 pg — ABNORMAL LOW (ref 28.3–34.3)
MCHC: 32.3 g/dL (ref 32.0–36.0)
MCV: 75.8 fL — ABNORMAL LOW (ref 82.0–97.0)
MONOCYTE #: 0.7 10*3/uL (ref 0.2–0.9)
MONOCYTE %: 8.5 % (ref 4.8–12.0)
MPV: 8.4 fL (ref 7.4–10.5)
NRBC: 0 /100 WBC (ref 0–0.6)
PLATELET COUNT: 165 K/uL (ref 150–450)
PMN #: 6.8 K/uL — ABNORMAL HIGH (ref 1.5–6.5)
PMN %: 77.2 % — ABNORMAL HIGH (ref 43.0–76.0)
RBC: 2.56 M/uL — ABNORMAL LOW (ref 4.40–5.80)

## 2014-07-10 LAB — ALPHA-1-ANTITRYPSIN: ALPHA-1-ANTITRYPSIN: 164 mg/dL (ref 90–200)

## 2014-07-10 LAB — H & H
HCT: 21.3 % — ABNORMAL LOW (ref 36.7–47.0)
HGB: 6.9 g/dL — CL (ref 12.5–16.3)

## 2014-07-10 LAB — HEPATIC FUNCTION PANEL
ALBUMIN: 2.5 g/dL — ABNORMAL LOW (ref 3.4–4.8)
ALKALINE PHOSPHATASE: 46 U/L (ref <150)
ALT (SGPT): 10 U/L (ref <55)
AST (SGOT): 16 U/L (ref 8–48)
BILIRUBIN, TOTAL: 1.9 mg/dL — ABNORMAL HIGH (ref 0.3–1.3)
BILIRUBIN,CONJUGATED: 0.7 mg/dL — ABNORMAL HIGH (ref <0.3)
TOTAL PROTEIN: 5.3 g/dL — ABNORMAL LOW (ref 6.0–8.0)

## 2014-07-10 LAB — PERFORM POC FINGERSTICK GLUCOSE
BLD GLUCOSE POCT: 141 mg/dL — ABNORMAL HIGH (ref 60–100)
BLD GLUCOSE POCT: 140 mg/dL — ABNORMAL HIGH (ref 60–100)

## 2014-07-10 LAB — CBC/DIFF
BASOPHILS: 1 %
BASOS ABS: 0.044 THOU/uL (ref 0.000–0.200)
EOS ABS: 0.094 10*3/uL (ref 0.000–0.500)
EOSINOPHIL: 1 %
HCT: 22.7 % — ABNORMAL LOW (ref 36.7–47.0)
HGB: 7.5 g/dL — ABNORMAL LOW (ref 12.5–16.3)
LYMPHOCYTES: 12 %
LYMPHS ABS: 1.077 THOU/uL (ref 1.000–4.800)
MCH: 26 pg — ABNORMAL LOW (ref 27.4–33.0)
MCHC: 33 g/dL (ref 32.5–35.8)
MCV: 78.7 fL (ref 78–100)
MONOCYTES: 6 %
MONOS ABS: 0.583 THOU/uL (ref 0.300–1.000)
MPV: 8.3 fL (ref 7.5–11.5)
PLATELET COUNT: 146 10*3/uL (ref 140–450)
PMN ABS: 7.293 10*3/uL (ref 1.500–7.700)
PMN'S: 80 %
RBC: 2.88 MIL/uL — ABNORMAL LOW (ref 4.06–5.63)
RDW: 19.6 % — ABNORMAL HIGH (ref 12.0–15.0)
WBC: 9.1 THOU/uL (ref 3.5–11.0)

## 2014-07-10 LAB — LACTIC ACID LEVEL
LACTIC ACID: 1.3 mmol/L (ref 0.5–2.2)
LACTIC ACID: 1.3 mmol/L (ref 0.5–2.2)

## 2014-07-10 LAB — PT/INR
INR: 1.69
INR: 1.4 — ABNORMAL HIGH (ref 0.80–1.20)
PROTHROMBIN TIME: 18.7 s — ABNORMAL HIGH (ref 9.4–12.5)
PROTHROMBIN TIME: 15.6 s — ABNORMAL HIGH (ref 9.0–13.4)

## 2014-07-10 LAB — TROPONIN-I
TROPONIN-I: 7 ng/L (ref <30)
TROPONIN-I: <7 ng/L (ref <30)

## 2014-07-10 LAB — PTT (PARTIAL THROMBOPLASTIN TIME): APTT: 31.3 s (ref 25.1–36.5)

## 2014-07-10 LAB — AMMONIA
AMMONIA: 49 umol/L (ref 15–50)
AMMONIA: 49 umol/L (ref 15–50)

## 2014-07-10 LAB — PHOSPHORUS: PHOSPHORUS: 1.4 mg/dL — ABNORMAL LOW (ref 2.3–4.0)

## 2014-07-10 LAB — FERRITIN
FERRITIN: 35 ng/mL (ref 20–300)
FERRITIN: 35 ng/mL (ref 20–300)

## 2014-07-10 LAB — TSH,3RD GENERATION: TSH 3RD GENERATION: 0.25 u[IU]/mL — AB (ref 0.340–5.600)

## 2014-07-10 LAB — LIPASE: LIPASE: 27 U/L (ref 10–80)

## 2014-07-10 LAB — CERULOPLASMIN: CERULOPLASMIN: 24 mg/dL (ref 18–45)

## 2014-07-10 LAB — MAGNESIUM: MAGNESIUM: 1.8 mg/dL (ref 1.6–2.5)

## 2014-07-10 LAB — CREATINE KINASE (CK), TOTAL, SERUM OR PLASMA: CREATINE KINASE (CK): 141 U/L (ref 45–225)

## 2014-07-10 LAB — FIBRINOGEN: FIBRINOGEN: 201 mg/dL (ref 200–400)

## 2014-07-10 SURGERY — COLONOSCOPY
Anesthesia: IV Sedation | Site: Anus | Wound class: Clean Contaminated Wounds-The respiratory, GI, Genital, or urinary

## 2014-07-10 SURGERY — CASE CANCELLED

## 2014-07-10 MED ORDER — CARVEDILOL 12.5 MG TABLET
12.50 mg | ORAL_TABLET | Freq: Two times a day (BID) | ORAL | Status: DC
Start: 2014-07-10 — End: 2014-07-18
  Administered 2014-07-10 – 2014-07-18 (×15): 12.5 mg via ORAL
  Filled 2014-07-10 (×17): qty 1

## 2014-07-10 MED ORDER — LEVOTHYROXINE 200 MCG TABLET
200.00 ug | ORAL_TABLET | Freq: Every morning | ORAL | Status: DC
Start: 2014-07-11 — End: 2014-07-18
  Administered 2014-07-11 – 2014-07-18 (×8): 200 ug via ORAL
  Filled 2014-07-10 (×9): qty 1

## 2014-07-10 MED ORDER — PEG 3350-ELECTROLYTES 236 GRAM-22.74 GRAM-6.74 GRAM-5.86 GRAM SOLUTION
2.00 L | ORAL | Status: DC
Start: 2014-07-10 — End: 2014-07-10
  Filled 2014-07-10: qty 2000

## 2014-07-10 MED ORDER — FENTANYL (PF) 50 MCG/ML INJECTION SOLUTION
INTRAMUSCULAR | Status: AC
Start: 2014-07-10 — End: 2014-07-10
  Administered 2014-07-10: 50 ug via INTRAVENOUS
  Filled 2014-07-10: qty 4

## 2014-07-10 MED ORDER — PERFLUTREN LIPID MICROSPHERES 1.1 MG/ML INTRAVENOUS SUSPENSION
0.30 mL | INTRAVENOUS | Status: AC
Start: 2014-07-10 — End: 2014-07-11
  Administered 2014-07-11: 0.3 mL via INTRAVENOUS

## 2014-07-10 MED ORDER — PEG 3350-ELECTROLYTES 236 GRAM-22.74 GRAM-6.74 GRAM-5.86 GRAM SOLUTION
4.0000 L | Freq: Once | ORAL | Status: DC
Start: 2014-07-10 — End: 2014-07-10

## 2014-07-10 MED ORDER — CALCIUM GLUCONATE 100 MG/ML (10 %) INTRAVENOUS SOLUTION
1000.0000 mg | Freq: Once | INTRAVENOUS | Status: AC
Start: 2014-07-10 — End: 2014-07-10
  Administered 2014-07-10: 1000 mg via INTRAVENOUS
  Filled 2014-07-10: qty 10

## 2014-07-10 MED ORDER — SODIUM PHOSPHATE 3 MMOL/ML INTRAVENOUS SOLUTION
30.0000 mmol | Freq: Once | INTRAVENOUS | Status: AC
Start: 2014-07-10 — End: 2014-07-10
  Administered 2014-07-10: 30 mmol via INTRAVENOUS
  Filled 2014-07-10: qty 10

## 2014-07-10 MED ORDER — PEG 3350-ELECTROLYTES 236 GRAM-22.74 GRAM-6.74 GRAM-5.86 GRAM SOLUTION
4.00 L | ORAL | Status: AC
Start: 2014-07-10 — End: 2014-07-10
  Administered 2014-07-10: 4000 mL via ORAL
  Filled 2014-07-10: qty 4000

## 2014-07-10 MED ORDER — INSULIN LISPRO 100 UNIT/ML SUB-Q SSIP
2.00 [IU] | INJECTION | Freq: Four times a day (QID) | SUBCUTANEOUS | Status: DC | PRN
Start: 2014-07-10 — End: 2014-07-18
  Filled 2014-07-10: qty 3

## 2014-07-10 MED ORDER — SODIUM CHLORIDE 0.9 % IV BOLUS
40.0000 mL | INJECTION | Freq: Once | Status: DC | PRN
Start: 2014-07-10 — End: 2014-07-11
  Administered 2014-07-10: 40 mL via INTRAVENOUS

## 2014-07-10 MED ORDER — SODIUM CHLORIDE 0.9 % IV BOLUS
40.00 mL | INJECTION | Freq: Once | Status: DC | PRN
Start: 2014-07-10 — End: 2014-07-11

## 2014-07-10 MED ORDER — PANTOPRAZOLE 40 MG INTRAVENOUS SOLUTION
80.0000 mg | Freq: Once | INTRAVENOUS | Status: AC
Start: 2014-07-10 — End: 2014-07-10
  Administered 2014-07-10: 80 mg via INTRAVENOUS
  Administered 2014-07-10: 0 mg via INTRAVENOUS
  Filled 2014-07-10: qty 20

## 2014-07-10 MED ORDER — MIDAZOLAM 1 MG/ML INJECTION SOLUTION
INTRAMUSCULAR | Status: AC
Start: 2014-07-10 — End: 2014-07-10
  Administered 2014-07-10: 2 mg via INTRAVENOUS
  Filled 2014-07-10: qty 10

## 2014-07-10 MED ORDER — PANTOPRAZOLE 40 MG TABLET,DELAYED RELEASE
40.0000 mg | DELAYED_RELEASE_TABLET | ORAL | Status: DC
Start: 2014-07-13 — End: 2014-07-18
  Administered 2014-07-13 – 2014-07-17 (×5): 40 mg via ORAL
  Filled 2014-07-10 (×6): qty 1

## 2014-07-10 MED ORDER — MIDAZOLAM 1 MG/ML INJECTION SOLUTION
2.0000 mg | INTRAMUSCULAR | Status: DC | PRN
Start: 2014-07-10 — End: 2014-07-13
  Administered 2014-07-10: 2 mg via INTRAVENOUS

## 2014-07-10 MED ORDER — SODIUM CHLORIDE 0.9 % INTRAVENOUS SOLUTION
Freq: Two times a day (BID) | INTRAVENOUS | Status: AC
Start: 2014-07-10 — End: 2014-07-13
  Filled 2014-07-10 (×6): qty 20

## 2014-07-10 MED ORDER — FENTANYL (PF) 50 MCG/ML INJECTION SOLUTION
50.0000 ug | INTRAMUSCULAR | Status: DC | PRN
Start: 2014-07-10 — End: 2014-07-11

## 2014-07-10 MED ADMIN — NICU SODIUM ACETATE LINE FLUSH: INTRAVENOUS | @ 22:00:00 | NDC 00517209625

## 2014-07-10 SURGICAL SUPPLY — 71 items
CANNULA INJ 17GA NDLS SYRG BLUNT STRL LF  10ML BD INTRLNK PLASTIC BXTR INTLNK ABT LS MCGAW SAFELINE (IV TUBING & ACCESSORIES) IMPLANT
CANNULA NASAL 14FT ANGL FLXB LIP PLATE CRSH RS LUM TUBE NFLR TP ADULT ARLF UCIT STD CURVE LF  DISP (CANNULA) IMPLANT
CANNULA NASAL 14FT ANGL FLXB L_IP PLATE CRSH RS LUM TUBE NFLR (CANNULA)
CANNULA NASAL 7FT ANGL FLXB LIP PLATE CRSH RS LUM TUBE FLR TIP ADULT ARLF UCIT STD CURVE LF  DISP (CANNULA) IMPLANT
CANNULA NASAL 7FT ANGL FLXB LI_P PLATE CRSH RS LUM TUBE FLR (CANNULA)
CANNULA PLASTIC NEEDLELESS IV_303345 100/BX (IV TUBING & ACCESSORIES)
CATH ELHMST GLD PRB 10FR 300CM_BIPO RND DIST TIP STD CONN (DIAGNOSTIC)
CATH ELHMST GLD PROBE 10FR 300CM BIPOLAR RND DIST TIP STD CONN FIRM SHAFT HMGLD STRL DISP 3.7MM MN (DIAGNOSTIC) IMPLANT
CATH ELHMST GLD PROBE 7FR 300CM BIPOLAR RND DIST TIP STD CONN FIRM SHAFT HMGLD STRL DISP 2.8MM MN (DIAGNOSTIC) IMPLANT
CATH ELHMST GLD PROBE 7FR 300C_M BIPOLAR RND DIST TIP STD (DIAGNOSTIC)
CLIP HMST RADOPQ PRELD STRL DISP RSL 235CM 2.8MM 11MM OPN (SURGICAL INSTRUMENTS) IMPLANT
CONV USE ITEM 343591 - SOLIDIFY FLUID 1500ML DSPNSR L_Q TX SOLIDIFY SFTP LTS+ DISP (STER) ×1 IMPLANT
DEVICE RESOLUTION CLIP 235CML_M00522611 11MMW 10EA/BX (INSTRUMENTS)
DEVICE SPEC RETR TLN 2.5MM 160CM 4 PRONG GRASPER INWRD HOOK SHEATH SS DISP (ENDOSCOPIC SUPPLIES) IMPLANT
DEVICE SPEC RETR TLN 2.5MM 160_CM 4 PRONG GRSP INWRD FACE (INSTRUMENTS ENDOMECHANICAL)
DILATOR ENDOS CRE 240CM 5.5CM 11-13.5-15MM 7.5FR ESOPH PYL BIL BAL LOW PROF GW PEBAX STRL LF  DISP (BALLOON) IMPLANT
DILATOR ENDOS CRE 240CM 5.5CM 15-16.5-18MM 7.5FR ESOPH PYL BIL BAL LOW PROF GW PEBAX STRL LF  DISP (BALLOON) IMPLANT
DILATOR ENDOS CRE 240CM 5.5CM_11-13.5-15MM 7.5FR ESOPH PYL (BALLOON)
DILATOR ENDOS CRE 240CM 5.5CM_15-16.5-18MM 7.5FR ESOPH PYL (BALLOON)
DISC USE ITEM 309153 - TRAP SPECI ARGYLE 40CC GRAD SC_REW ON CAP REM MALE CONN (Cautery Accessories)
DISCONTINUED NO SUB - JELLY LUB DYNALUBE BCTRST WATER SOL NGRS PKT STRL 5GM LF (WOUND CARE SUPPLY) IMPLANT
DISCONTINUED USE ITEM 309153 - TRAP SPECI ARGYLE 40CC GRAD SC_REW ON CAP REM MALE CONN (Cautery Accessories) IMPLANT
DISCONTINUED USE ITEM 339015 - CONTAINR STRL 10% NEUT BF FRMLN POLYPROP GRAD LEAK RST ORNG PREFL SCREW CAP FSHR HLTHCR PRTCL GRN (CHEM) ×1 IMPLANT
DISCONTINUED USE ITEM 82101 - TUBING OXYGEN 50/CS 001302 (TUBE/TUBING & SUCTION SUPPLIES) IMPLANT
ELECTRODE PATIENT RTN 9FT VLAB C30- LB RM PHSV ACRL FOAM CORD NONIRRITATE NONSENSITIZE ADH STRP (CAUTERY SUPPLIES) IMPLANT
ELECTRODE PATIENT RTN 9FT VLAB_REM C30- LB PLHSV ACRL FOAM (CAUTERY SUPPLIES)
FORCEPS BIOPSY 240CM 2.4MM RJ_4 2.8MM LRG CPC DISP ORNG (GUIDING)
FORCEPS BIOPSY HOT 240CM 2.2MM RJ 4 +2.8MM DISP (INSTRUMENTS)
FORCEPS BIOPSY HOT 240CM 2.2MM RJ 4 +2.8MM DISPO (SURGICAL INSTRUMENTS) IMPLANT
FORCEPS BIOPSY HOT 240CM 2.2MM_RJ 4 +2.8MM DISP (INSTRUMENTS)
FORCEPS BIOPSY MICROMESH TTH STREAMLINE CATH 240CM 2.4MM RJ 4 SS LRG CPC STRL DISP ORNG 2.8MM WRK (GUIDING) IMPLANT
FORCEPS BIOPSY NEEDLE 160CM 1.8MM RJ 4 DISP YW 2MM WRK CHNL GSPED (SURGICAL INSTRUMENTS) IMPLANT
FORCEPS BIOPSY NEEDLE 160CM 1._8MM RJ 4 PED 2+ MM DISP (INSTRUMENTS)
FORCEPS BIOPSY NEEDLE 240CM 2.2MM RJ 4 2.8MM STD CPC STRL DISP ORNG (SURGICAL INSTRUMENTS) ×1 IMPLANT
FORCEPS BIOPSY NEEDLE 240CM 2._2MM RJ 4 2.8MM STD CPC STRL (INSTRUMENTS) ×1
FORCEPS ENDOS 240CM 2.8MM RJ 4 JMB DISP (SURGICAL INSTRUMENTS) IMPLANT
FORCEPS ENDOS 240CM 2.8MM RJ 4_JMB DISP (INSTRUMENTS)
FORMALIN 10% BUFF 8ML_23032059 100EA/PK (CHEM) ×1
INK ENDOSCOPIC MARKER SPOT 5ML GIS44 STERILE 10EA/BX (MISCELLANEOUS PT CARE ITEMS) IMPLANT
JELLY LUB EZ BCTRST H2O SOL NG_RS PKT STRL 5GM LF (WOUND CARE/ENTEROSTOMAL SUPPLY)
KIT ENDOS ENDOZIME BDSD SLR SPONGE ENZM DETERGENT 500ML (DIS) ×1 IMPLANT
KIT ENDOS ENZM BDSD SLR SPONGE_ENZM DTRG 500ML (DIS) ×1
LINER SUCT RD CRD MEDIVAC TW LOCK LID SHTOF VALVE CAN FILTER 1500CC LF  DISP (Suction) ×1 IMPLANT
LINER SUCT RD CRD MEDIVAC TW L_OCK LID SHTOF VALVE CAN FLTR (Suction) ×1
LOOP AUTOCLAV HLDR DTCH 30MM S_URG NYL PLPCTM NONST DISP (INSTRUMENTS ENDOMECHANICAL)
LOOP ENDO DETACH 20MM MAJ340 (GU) IMPLANT
LOOP HLDR DTCH AUTOCLAV 30MM SURG NYL PLPCTM NONST LF  DISP (ENDOSCOPIC SUPPLIES) IMPLANT
NEEDLE ENDOS 5MM 25GA 2.5MM SS TFLN 230CM INJ PRJ SHEATH LL SPRG LD HNDL CRLK STRL LF  DISP (NEEDLES & SYRINGE SUPPLIES) IMPLANT
NEEDLE ENDOS 5MM 25GA 2.5MM SS_TFLN 230CM INJ PRJ SHTH LL (NEEDLES & SYRINGE SUPPLIES)
NEEDLE SCLRTX 25GA 2.3MM OPTC TIP SHTH STRL LF DISP YW (NEEDLES & SYRINGE SUPPLIES) IMPLANT
PROBE COAG 7.2FT 6.9FR FIAPC CRCMF PLUG PLAY FUNCTIONALITY FILTER (ENDOSCOPIC SUPPLIES) IMPLANT
PROBE ESURG 220CM 2.3MM FIAPC FLXB STR FIRE STRL DISP (CAUTERY SUPPLIES) IMPLANT
PROBE ESURG 220CM 2.3MM FIAPC_FLXB STR FIRE ARGON PLAS COAG (CAUTERY SUPPLIES)
PROBE ESURG 7.2FT 6.9FR FIAPC_CRCMF PLUG PLAY FUNCTIONALITY (INSTRUMENTS ENDOMECHANICAL)
SET CAP ENDO PUMPTUBE 2032522_EA (GENE)
SET EXT STR 6.2ML 42IN IV MALE LL ADPR STD BORE 2 LL ACT VALVE RETRACT COL STRL CLRLNK LF  DEHP (IV TUBING & ACCESSORIES) IMPLANT
SET EXT STR 6.2ML 42IN IV MALE_LL ADPR STD BORE 2 LL ACT (IV TUBING & ACCESSORIES)
SET TUBING ERBEFLOW CAP 24 HR ENDOSCP PUMP STRL DISP ORDER 10EA (GENE) IMPLANT
SNARE SM OVAL 240CM 2.4MM SNS LOOP SHRTHRW FLXB ENDOS PLPCTM 13MM STRL LF  DISP (DIAGNOSTIC) IMPLANT
SNARE SM OVL 240CM 2.4MM SNS L_OOP SHRTHRW FLXB ENDOS PLPCTM (DIAGNOSTIC)
SOLIDIFY FLUID 1500ML DSPNSR L_Q TX SOLIDIFY SFTP LTS+ DISP (STER) ×2 IMPLANT
SOLUTION IRRG H2O 500CC 2F7113 (SOLUTIONS) ×1
SYRINGE INFLAT ALN II GA STRL DISP 60ML (NEEDLES & SYRINGE SUPPLIES) IMPLANT
SYRINGE INFLAT ALN II GA STRL_DISP 60ML (NEEDLES & SYRINGE SUPPLIES)
SYRINGE LL 50ML LF  STRL GRAD N-PYRG DEHP-FR PVC FREE MED DISP CLR (NEEDLES & SYRINGE SUPPLIES) IMPLANT
SYRINGE LL 50ML LF STRL GRAD_N-PYRG DEHP-FR PVC FREE MED (NEEDLES & SYRINGE SUPPLIES)
TRAP SPEC RETR ETRAP MAGNIFY W IND MSR GUIDE PLYP LF DISP (GI LAB SUPPLIES) IMPLANT
TUBING OXYGEN 50/CS 001302 (TUBE/TUBING & SUCTION SUPPLIES)
TUBING SUCT CLR 20FT 9/32IN MEDIVAC NCDTV M/M CONN STRL LF (Suction) ×1 IMPLANT
TUBING SUCT CONN 20FT LONG_STRL N720A (Suction) ×1
WATER STRL 500ML PLASTIC PR BTL LF (SOLUTIONS) ×1 IMPLANT

## 2014-07-10 SURGICAL SUPPLY — 8 items
GW ENDOSCOPIC .035IN 260CM DREAMWIRE ANG RX EGLD NITINOL BIL STRL DISP (ENDOSCOPIC SUPPLIES) IMPLANT
INK ENDOSCOPIC MARKER SPOT 5ML GIS44 STERILE 10EA/BX (MISCELLANEOUS PT CARE ITEMS) IMPLANT
SNARE LRG OVL MED STF ENDOS OL MPS DISP (INSTRUMENTS ENDOMECHANICAL)
SYRINGE LL 30ML LF  STRL CONCEN TIP GRAD N-PYRG DEHP-FR MED DISP (NEEDLES & SYRINGE SUPPLIES)
SYRINGE LL 30ML LF STRL CONCE N TIP GRAD N-PYRG DEHP-FR MED (NEEDLES & SYRINGE SUPPLIES)
TRAP SPEC RETR 15CM ETRAP PLPC_TM STRL DISP (INSTRUMENTS)
TRAP SPECI REM 15CM ETRAP MAGNIFY WIND MSR GUIDE PLPCTM STRL LF  DISP (INSTRUMENTS)
TRAP SPECI REM 15CM ETRAP MAGNIFY WIND MSR GUIDE PLPCTM STRL LF  DISP (SURGICAL INSTRUMENTS) IMPLANT

## 2014-07-10 NOTE — Nurses Notes (Signed)
!  st Unit FFP infused> Health care RN's checked and hanging 2nd unit FFP - Unit #[id number].
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
## 2014-07-10 NOTE — Anesthesia Postprocedure Evaluation (Signed)
ANESTHESIA POSTOP EVALUATION NOTE         07/10/2014     Last Vitals: Temperature: 37 C (98.6 F) (07/10/14 0600)  Heart Rate: (!) 101 (07/10/14 0600)  BP (Non-Invasive): 116/66 mmHg (07/10/14 0600)  Respiratory Rate: 19 (07/10/14 0600)  SpO2-1: 98 % (07/10/14 0600)  Pain Score (Numeric, Faces): 0 (07/10/14 0000)    Procedures:   GASTROSCOPY (N/A )    Patient is sufficiently recovered from the effects of anesthesia to participate in the evaluation and has returned to their pre-procedure level.  I have reviewed and evaluated the following:  Respiratory Function: Consistent with pre anesthetic level  Cardiovascular Function: Consistent with pre anesthetic level  Mental Status: Return to pre anesthetic baseline level  Pain: Sufficiently controlled with medication  Nausea and Vomiting: Absent or sufficiently controlled with medication  Post-op Anesthetic Complications: None    Comment/ re-evaluation for any variations: None

## 2014-07-10 NOTE — Progress Notes (Signed)
Called by nurse pt passing fresh blood per rectum. Arteriogram is indicated discussed with Dr.Cicchio.insulin reactions not available to pt needs transfer to [facility] for IR arteriography with possible emboization.I spoke with Dr.McCarthy and he will make arrangements for transfer pt will needs helicpter transfer.

## 2014-07-10 NOTE — Progress Notes (Signed)
[facility]  [place], [place] [address]    General Surgery Progress Note      Kenneth Weeks, Kenneth Weeks, 64 y.o. male  Date of Service: 07/10/2014  Date of Birth:  03/12/1950    Hospital Day:  LOS: 1 day     Chief Complaint: GI Bleed  Subjective: no pain, continued blood per rectum    Objective:  Temperature: 36.9 C (98.4 F)  Heart Rate: 95  BP (Non-Invasive): (!) 136/55 mmHg  Respiratory Rate: (!) 22  SpO2-1: 99 %  Pain Score (Numeric, Faces): 0    General: appears in good health  Lungs: Clear to auscultation bilaterally.   Cardiovascular:    tachycardic   Abdomen: Soft, non-tender    I/O:   Intake/Output Summary (Last 24 hours) at 07/10/14 1335  Last data filed at 07/10/14 1200   Gross per 24 hour   Intake   4884 ml   Output   1730 ml   Net   3154 ml       Labs:    BMP:   Lab Results   Component Value Date    SODIUM 137 07/10/2014    POTASSIUM 4.2 07/10/2014    CHLORIDE 113* 07/10/2014    CO2 21* 07/10/2014    BUN 16 07/10/2014    CREATININE 1.17 07/10/2014    GLUCOSECJ 136* 07/10/2014    ANIONGAP 3 07/10/2014    GFR >60 07/10/2014    CALCIUM 7.5* 07/10/2014     CBC:   Lab Results   Component Value Date    WBC 8.8 07/10/2014    HGB 6.3* 07/10/2014    HCT 19.4* 07/10/2014    PLTCNT 165 07/10/2014    RBC 2.56* 07/10/2014    MCV 75.8* 07/10/2014    MCHC 32.3 07/10/2014    MCH 24.5* 07/10/2014    RDW 20.9* 07/10/2014    MPV 8.4 07/10/2014     Hepatic Function:  Lab Results   Component Value Date    TOTALPROTEIN 4.4* 07/10/2014    ALBUMIN 2.4* 07/10/2014    TOTBILIRUBIN 1.8* 07/10/2014    AST 18 07/10/2014    ALT 11 07/10/2014    ALKPHOS 38 07/10/2014    AMYLASE 39 07/09/2014    LIPASE 20 07/09/2014    AMMONIA 10 07/09/2014       Assessment:  Rectal bleeding, bright red several large bloody BM today  Mechanical heart valve      Plan:  Discussed with Dr Lovett Calender plan was for colonoscopy today as INR improved to 1.69  But patient with continued rectal bleeding  Recommend IR arteriogram but not  available today  Recommend transfer to [facility]

## 2014-07-10 NOTE — Care Plan (Signed)
Problem: General Plan of Care(Adult,OB)  Goal: Plan of Care Review(Adult,OB)  The patient and/or their representative will communicate an understanding of their plan of care   Outcome: Ongoing (see interventions/notes)  Patient admitted to ICU from outlying hospital.  Placed on NC at 2L, with sat's 98%.

## 2014-07-10 NOTE — Discharge Summary (Signed)
[facility]  [place], [place] [address]    DISCHARGE SUMMARY      PATIENT NAME:  Kenneth Weeks, Kenneth Weeks  MRN:  [id number]  DOB:  November 02, 1950    ADMISSION DATE:  07/09/2014  DISCHARGE DATE:  07/10/2014     ATTENDING PHYSICIAN: Shane Crutch MD   PRIMARY CARE PHYSICIAN: Viona Gilmore, MD     ADMISSION DIAGNOSIS:  Chief Complaint   Patient presents with    Anal Bleeding       DISCHARGE DIAGNOSIS:   Hospital Problems) (* Primary Problem)    Diagnosis Date Noted    Acute GI bleeding 07/09/2014      Resolved Hospital Problems    Diagnosis Date Noted Date Resolved   No resolved problems to display.     Active Non-Hospital Problems    Diagnosis Date Noted    Monitoring for long-term anticoagulant use 05/30/2014    Hydrocele 07/30/2010      DISCHARGE MEDICATIONS:     Current Discharge Medication List      CONTINUE these medications - NO CHANGES were made during your visit.       Details    carvedilol 12.5 mg Tablet   Commonly known as:  COREG    12.5 mg, Oral, 2 TIMES DAILY WITH FOOD   Qty:  60 Tab   Refills:  5       furosemide 40 mg Tablet   Commonly known as:  LASIX    40 mg, Oral, 3 TIMES DAILY,     Refills:  0       levothyroxine 200 mcg Tablet   Commonly known as:  SYNTHROID    200 mcg, Oral, DAILY,     Refills:  0       MAGNESIUM CARBONATE ORAL    Oral, DAILY   Refills:  0       POTASSIUM GLUCONATE ORAL    Oral, DAILY   Refills:  0       spironolactone 50 mg Tablet   Commonly known as:  ALDACTONE    50 mg, Oral, DAILY   Qty:  30 Tab   Refills:  5       warfarin 7.5 mg Tablet   Commonly known as:  COUMADIN    7.5 mg, Oral, DAILY, 7.5 mg Monday- Friday and 10 mg Saturday and Sunday   Refills:  0           DISCHARGE INSTRUCTIONS:     GASTROSCOPY WITH BANDING         REASON FOR HOSPITALIZATION AND HOSPITAL COURSE:  This is a 64 y.o., male admitted for GI bleeding.     He had a high INR: warfarin drug reaction with recently prescribed Keflex gave him and INR of 10.     INR was revered with  FFP. He was scoped via EGD: no source of bleeding seen.   Multiple PRBC's and more FFP.     Getting prepped for a colonoscopy today: still bleeding. Ideally, this patients needs an arteriogram. However, we do not have that capability here at the hospital today.     Discussion amongst GI, General Surgery and myself: needs immediate transfer and needs to get there via helicopter.     I spoke to MICU transfer on the [place]. Patient accepted for transfer.     Helicopter transport being arranged as we speak.             Copies sent to Care Team  Relationship Specialty Notifications Start End    Viona Gilmore, MD PCP - [facility]  05/11/14     Phone: [phone] Fax: [phone]         [address] [place] [address]

## 2014-07-10 NOTE — Care Management Notes (Signed)
MARS INTAKE    Insurance Information:    Referring Provider and Contact Number:  Dr. Shane Crutch [phone]  Transfer Source and Contact Number:   [facility] ICU [phone]  Transfer Emergent:  Yes    Date Admitted:  07/09/2014  Diagnosis:  GI Bleed  PMH: Aortic Valve Replacement, on Coumadin at home  Past Medical History   Diagnosis Date    Marfan syndrome     Congestive heart failure     Diabetes mellitus      type ii diet controlled    Wears glasses     Thyroid disease      hypothyroid    Reflux     Hydrocele     Hemorrhage of gastrointestinal tract 5/16    A-fib      Chronic anti-coagulant therapy    Transfusion history      s/p Multiple units PRC/FFP this adm.    Syncope     Hemorrhagic shock     Acute on chronic renal failure     Muscle weakness      Generalized       Dialysis:   no    Cancer:  no     Isolation:  no     Is patient established with family practice/attending?    Reason for transfer:  Need for higher level of care    Patient story/clinical presentation:  Active GI bleed x 1 day. Pt had upper endoscopy 07/09/14- negative. CT Abd negative for mass/abscess. Hgb 6.1, INR >2, BP 110/60 on admission. Pt has received 4 FFP, 6 PRBC, continued brisk lower GI bleed. Hgb was up to 7.9, currently 6.3.  Pt has maintained vital stability, no need for pressors at this time. A/O x4.     Current vital signs:    HR:     107   BP:     142/80   Resp:     23   Temp:     36.9   Sats:     99%   O2:     RA       Per MD labs WNL unless noted below.    Labs:  WBC (3.6-11) 8.8   HGB (13.1-17.3) 6.3   Hct (39.8-50.2) 19.4   PLT (140-400) 165   Na (135-145)    K+ (3.5-5.1)    CL (96-111)    CO2 (23-35)    BUN (8-26)    Cr (0.62-1.27)    Glucose (60-105)    Ca (8.5-10.4)    Mag (1.6-2.5)    Phos (2.4-4.7)    BNP (<100)    D-Dimer (<233)    AST (8-41)    ALT (<55)    Alk Phos (<150)    Amylase (25-125)    Lipase (10-80)    T. Bili (0.3-1.3)    PT (8.7-13.2) 18.7   PTT (25.1-36.5)    INR (0.8-1.2)  1.7   Trop-1 (<0.03)    CK    CPK    CK-MB    ABG ph    ABG PCO2    ABG PO2    ABG HCO3    Base def/exc    LP Glucose    LP Neutrophil    LP Protein    LP WBC    LP Other            Radiology (please place images on image grid): CT Abd   EKG: afib, rate 90  IV Access:  Medications, IVF, Drips:  MIVF    Oxygen/Bipap/Vent settings:  RA  TV:        Peep:        FiO2:        Rate:            Pt class and accommodation: Inpatient, Intensive Care  Service and accepting MD:  Medical Intensive Care, Dr. Rod Holler    *Send Text Page to accepting service MD. *

## 2014-07-10 NOTE — Nurses Notes (Signed)
Pt to ICU on cart.  Awake and alert.  Cooperative.  Transferred to ICU and placed on ICU monitor.  See flowsheet for VS and general assessment.  RN will monitor per protocol and follow plan of care.

## 2014-07-10 NOTE — Student (Addendum)
Kenneth Weeks  History and Physical Note    07/10/2014  PCP - Viona Gilmore, MD    Kenneth Weeks   64 y.o. male  [id number]    CC: "Rectal bleeding"    HPI: Kenneth Weeks is a 64 yo WM, who is on Warfarin for mechanical heart valve, admitted 1 day ago for "rectal bleeding" that began two nights ago. He reports 5 - 6 episodes of a large amount of bright red blood per rectum and "rabbit pellet stools." He reports his blood pressure was 80/40 at home prior to calling EMS. On admission, his INR was 10; he states he has been on the same dose for many years but has recently taken an antibiotic from Dr. Karle Barr and has started spironolactone and Coreg. He has recently lost ~ 60 lbs of "water weight" but is unsure of why he accumulated so much fluid. Reports wheezes and "belly rolling;" denies headache, chest pain, shortness of breath, vomiting, abdominal pain, and lower extremity edema.    Patient states he has received 7 pRBC so far and has more ordered today. He is drinking GoLytely in preparation for a colonoscopy this afternoon. His stools are still bloody. He has no questions or concerns at this time.     Review of Systems    Constitutional - No headache, fever, chills, weight loss.  HEENT - No vision changes, hearing changes, nasal congestion/pressure, sore throat, cough.  CV - + edema (much improved after Lasix). No chest pain, chest palpitations, syncope.  Respiratory - + wheezes. No shortness of breath, hemoptysis.  Abdomen - + hematochezia and hyperactive bowel sounds. No abdominal pain, nausea, vomiting, constipation, melena.  GU - No dysuria, increased urinary frequency, urinary urgency.  Neuro - No weakness, decreased sensation.  Skin - No rashes, changes to nevi.  Heme/Onc - No easy bruising, bleeding.  Psych - No depression, mania, anxiety, or suicidal ideations.    Past Medical History:    Conditions -    Type 2 DM   CHF   Marfan  syndrome   Hypothyroidism   Liver cirrhosis  Medications -    Coreg 12.5mg  PO BID   Lasix 40mg  PO TID   Synthroid 254mcg PO QD   Spironolactone 50mg  PO QD   Warfarin 7.5mg  PO on Mon-Fri and 10mg  PO on Saturday and Sunday   Magnesium carbonate and Potassium gluconate supplements  Surgeries -    Heart valve replacement in 1999 for [employer]'s valve. Done in [place], [place].   Tonsillectomy as a child.   Upper endoscopy yesterday   Scheduled for colonoscopy today  Allergies -    NKDA  Transfusions -    Has received 7 units during this admission thus far, 3 more ordered today  Healthcare Maintenance -    Never had a colonoscopy (On Warfarin before 64yo.    Family History:    Mother - Deceased, age 22 from cerebral hemorrhage while on blood thinners for DVT.  Father - Deceased 36 from unknown cancer.    Denies family history of MI, CVA, and colon cancer.    Social History:  Cigarettes / tobacco - None  Alcohol - None  Illicit drugs - Marijuana and speed while in college. None since that time.    Lives in [place] with his wife.     Physical Exam    Blood pressure 116/66, pulse 101, temperature 37 C (98.6 F), resp. rate 19, height 1.93  m (6\' 4" ), weight 126.1 kg (278 lb), SpO2 98 %.    Constitutional - Obese, chronically ill appearing  male in no acute distress.   HEENT - Normocephalic. Atraumatic. PERRLA. EOMI. Conjunctiva clear. Oropharynx with no erythema, lesions, or exudates. Moist mucous membranes. No sinus tenderness to palpation.  Neck - Trachea midline. No adenopathy. IJ in place in the R neck.  Cardiac - Regular rate and rhythm. No murmurs, rubs, or gallops appreciated. Mechanical valve click appreciated. No edema. Radial pulses palpated.  Respiratory - Bilateral rales in the bases. No rhonchi. Good air movement. No cough.   Abdomen - Non-tender. No rebound or guarding. Hyperactive bowel sounds auscultated in all four quadrants.  Musculoskeletal - Good AROM. No muscle or joint tenderness appreciated.   Skin -  Warm and dry. No bruises. Several seborrheic keratoses on the back. Chronic venous stasis changes to lower extremities to the mid-shin.  Neuro - CN II - XII grossly intact. Strength 5/5 bilaterally in upper and lower extremity muscle groups. Sensation intact to fine touch distally.  Psych - Alert and oriented. Answers questions appropriately.     Laboratory Results and Radiology  Results for orders placed or performed during the hospital encounter of 07/09/14 (from the past 24 hour(s))   H & H *Canceled*    Narrative    Cancel Comments  DUPLICATE, SEE B284   CDIFF BY PCR - [facility] ONLY   Result Value Ref Range    TOXIGENIC CDIFF NEGATIVE NEGATIVE    027PRESUMPTIVE NEGATIVE NEGATIVE   PT/INR   Result Value Ref Range    PROTHROMBIN TIME 36.7 (U) 9.4 - 12.5 sec    INR 3.30    CBC   Result Value Ref Range    WBC 8.5 4.0 - 11.0 K/uL    RBC 2.44 (L) 4.40 - 5.80 M/uL    HGB 5.1 (LL) 13.5 - 18.0 g/dL    HCT 17.3 (LL) 40.0 - 50.0 %    MCV 70.8 (L) 82.0 - 97.0 fL    MCH 21.1 (L) 28.3 - 34.3 pg    MCHC 29.7 (L) 32.0 - 36.0 g/dL    RDW 20.0 (H) 10.5 - 14.5 %    PLATELET COUNT 197 (D) 150 - 450 K/uL    MPV 8.7 7.4 - 10.5 fL    NRBC 0 0 - 0.6 /100 WBC    NRBC ABSOLUTE 0.00 0 - 0.02 K/uL    PMN % 86.8 (H) 43.0 - 76.0 %    LYMPHOCYTE % 8.8 (L) 15.0 - 43.0 %    MONOCYTE % 4.1 (L) 4.8 - 12.0 %    EOSINOPHIL % 0.0 0.0 - 5.2 %    BASOPHILS % 0.3 0.0 - 2.5 %    PMN # 7.4 (H) 1.5 - 6.5 K/uL    LYMPHOCYTE # 0.7 0.7 - 3.2 K/uL    MONOCYTE # 0.4 0.2 - 0.9 K/uL    EOSINOPHIL # 0.0 0.0 - 0.5 K/uL    BASOPHIL # 0.0 0.0 - 0.1 K/uL   PT/INR   Result Value Ref Range    PROTHROMBIN TIME 21.1 (U) 9.4 - 12.5 sec    INR 1.91    CBC W/ AUTOMATED DIFF - [facility] ONLY   Result Value Ref Range    WBC 8.9 4.0 - 11.0 K/uL    RBC 3.22 (L) 4.40 - 5.80 M/uL    HGB 7.9 (LL) 13.5 - 18.0 g/dL    HCT 24.4 (D) 40.0 - 50.0 %  MCV 76.0 (D) 82.0 - 97.0 fL    MCH 24.6 (L) 28.3 - 34.3 pg    MCHC 32.4 32.0 - 36.0 g/dL    RDW 21.1 (H) 10.5 - 14.5 %    PLATELET COUNT 167 150  - 450 K/uL    MPV 8.4 7.4 - 10.5 fL    NRBC 0 0 - 0.6 /100 WBC    NRBC ABSOLUTE 0.00 0 - 0.02 K/uL    PMN % 84.9 (H) 43.0 - 76.0 %    LYMPHOCYTE % 8.9 (L) 15.0 - 43.0 %    MONOCYTE % 5.8 4.8 - 12.0 %    EOSINOPHIL % 0.0 0.0 - 5.2 %    BASOPHILS % 0.4 0.0 - 2.5 %    PMN # 7.5 (H) 1.5 - 6.5 K/uL    LYMPHOCYTE # 0.8 0.7 - 3.2 K/uL    MONOCYTE # 0.5 0.2 - 0.9 K/uL    EOSINOPHIL # 0.0 0.0 - 0.5 K/uL    BASOPHIL # 0.0 0.0 - 0.1 K/uL   PROTHROMBIN TIME, THERAPEUTIC - [facility] ONLY *Canceled*    Narrative    Cancel Comments  Duplicate order, test cancelled.   COMPREHENSIVE METABOLIC PROFILE - [facility] ONLY   Result Value Ref Range    GLUCOSE 144 (H) 70 - 110 mg/dL    BUN 21 6 - 22 mg/dL    CREATININE 1.26 0.72 - 1.30 mg/dL    ESTIMATED GLOMERULAR FILTRATION RATE 57 (L) >60 ml/min    SODIUM 138 136 - 145 mmol/L    POTASSIUM 4.5 3.5 - 5.0 mmol/L    CHLORIDE 109 101 - 111 mmol/L    CARBON DIOXIDE 25 22 - 32 mmol/L    ANION GAP 4 3 - 11 mmol/L    CALCIUM 7.7 (L) 8.5 - 10.5 mg/dL    TOTAL PROTEIN 5.2 (L) 6.0 - 8.0 g/dL    ALBUMIN 2.9 (L) 3.2 - 5.0 g/dL    ALBUMIN/GLOBULIN RATIO 1.2     BILIRUBIN, TOTAL 2.1 (H) 0.0 - 1.3 mg/dL    AST (SGOT) 17 0 - 45 IU/L    ALT (SGPT) 13 0 - 63 IU/L    ALKALINE PHOSPHATASE 44 35 - 120 IU/L   H & H *Canceled*    Narrative    Cancel Comments  CBC ORDERED     CBC W/ AUTOMATED DIFF - [facility] ONLY   Result Value Ref Range    WBC 8.8 4.0 - 11.0 K/uL    RBC 2.56 (L) 4.40 - 5.80 M/uL    HGB 6.3 (LL) 13.5 - 18.0 g/dL    HCT 19.4 (LL) 40.0 - 50.0 %    MCV 75.8 (L) 82.0 - 97.0 fL    MCH 24.5 (L) 28.3 - 34.3 pg    MCHC 32.3 32.0 - 36.0 g/dL    RDW 20.9 (H) 10.5 - 14.5 %    PLATELET COUNT 165 150 - 450 K/uL    MPV 8.4 7.4 - 10.5 fL    NRBC 0 0 - 0.6 /100 WBC    NRBC ABSOLUTE 0.00 0 - 0.02 K/uL    PMN % 77.2 (H) 43.0 - 76.0 %    LYMPHOCYTE % 13.0 (L) 15.0 - 43.0 %    MONOCYTE % 8.5 4.8 - 12.0 %    EOSINOPHIL % 0.5 0.0 - 5.2 %    BASOPHILS % 0.8 0.0 - 2.5 %    PMN # 6.8 (H) 1.5 - 6.5 K/uL    LYMPHOCYTE #  1.1 0.7 -  3.2 K/uL    MONOCYTE # 0.7 0.2 - 0.9 K/uL    EOSINOPHIL # 0.0 0.0 - 0.5 K/uL    BASOPHIL # 0.1 0.0 - 0.1 K/uL   COMPREHENSIVE METABOLIC PROFILE - [facility] ONLY   Result Value Ref Range    GLUCOSE 136 (H) 70 - 110 mg/dL    BUN 16 6 - 22 mg/dL    CREATININE 1.17 0.72 - 1.30 mg/dL    ESTIMATED GLOMERULAR FILTRATION RATE >60 >60 ml/min    SODIUM 137 136 - 145 mmol/L    POTASSIUM 4.2 3.5 - 5.0 mmol/L    CHLORIDE 113 (H) 101 - 111 mmol/L    CARBON DIOXIDE 21 (L) 22 - 32 mmol/L    ANION GAP 3 3 - 11 mmol/L    CALCIUM 7.5 (L) 8.5 - 10.5 mg/dL    TOTAL PROTEIN 4.4 (L) 6.0 - 8.0 g/dL    ALBUMIN 2.4 (L) 3.2 - 5.0 g/dL    ALBUMIN/GLOBULIN RATIO 1.2     BILIRUBIN, TOTAL 1.8 (H) 0.0 - 1.3 mg/dL    AST (SGOT) 18 0 - 45 IU/L    ALT (SGPT) 11 0 - 63 IU/L    ALKALINE PHOSPHATASE 38 35 - 120 IU/L   TSH,3RD GENERATION - [facility] ONLY   Result Value Ref Range    TSH 3RD GENERATION 0.250 (D) 0.340 - 5.600 uIU/mL   PT/INR   Result Value Ref Range    PROTHROMBIN TIME 18.7 (H) 9.4 - 12.5 sec    INR 1.69      Differential Diagnosis:  1. Diverticulosis  2. Colon cancer  3. AVM  4. Internal hemorrhoids    Assessment and Plan:    Kenneth Weeks is a 64 y.o. male admitted 1 day ago for lower GI bleeding.    1. Lower GI bleeding  1. Will undergo colonoscopy today, doing GoLytely prep this morning. Upper endoscopy with no source.  2. 7 pRBC already transfused, plan for 3 more today.  3. INR was supratherapeutic, now 1.69. Will titrate up to 2.5-3.5 range once stable after colonoscopy.  2. Congestive heart failure  1. On Coreg, Lasix, and Spironolactone  2. Continue to monitor  3. Hypothyroidism  1. Continue Synthroid  4. Mechanical heart valve  1. Warfarin once done colonoscopy  5. Type 2 DM  1. Dietary control while admitted with fingersticks prior to meals  6. Concern for liver cirrhosis  1. Ultrasound done today          Pamalee Leyden, MED STUDENT  07/10/2014, 09:12
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
## 2014-07-10 NOTE — OR Nursing (Signed)
Consent signed and witnessed. Pt educated on procedure. Denies any questions. Abdomen soft and tenderr..Will continue to monitor

## 2014-07-10 NOTE — Nurses Notes (Signed)
Updated MD on H/H new orders for 3units PRBC for transfusion obtained

## 2014-07-10 NOTE — Progress Notes (Addendum)
[facility]  [place], [place] [address]      Inpatient Progress Note for [facility]       Kenneth Weeks  Date of Admission:  07/09/2014  Date of Birth:  01/16/1951  Date of Service:  07/10/2014      SUBJECTIVE:   Vitals reviewed from the EMR     Nursing documentation reviewed from overnight     Good urine volumes     This patient was admitted by the hospitalist service. As always, thank your for your professional service.       Vital Signs:  Temp (24hrs) Max:37.4 C (99.3 F)      Temperature: 37 C (98.6 F)  BP (Non-Invasive): 116/66 mmHg  MAP (Non-Invasive): 78 mmHG  Heart Rate: (!) 101  Respiratory Rate: 19  Pain Score (Numeric, Faces): 0  SpO2-1: 98 %      PHYSICAL EXAMINATION:     General: alert and oriented   Eyes: PERRL, EOMI   HENT: no injection   Lungs: clear to ascultation; no stridor  Aortic valve click heard   Cardiovascular: regular rate and rhythm; no murmur   Abdomen: soft; non-tender; not distended; no gaurding and no rebound   Extremities: no edema noted  Skin: no rashes; normal hair distribution   Neurologic: 5/5 strength in major muscle groups; alert and oriented   Psychiatric: pleasant and appropriate     I/O:  I/O last 24 hours:    Intake/Output Summary (Last 24 hours) at 07/10/14 0813  Last data filed at 07/10/14 0800   Gross per 24 hour   Intake   3634 ml   Output   1730 ml   Net   1904 ml     I/O current shift:  05/31 0800 - 05/31 1559  In: -   Out: 200 [Urine:200]    Current Medications:    Current Facility-Administered Medications:  cefTRIAXone (ROCEPHIN) 1 g in iso-osmotic 50 mL premix IVPB 1 g Intravenous Q24H   levothyroxine (SYNTHROID) 20 mcg/mL injection 100 mcg Intravenous QAM   lidocaine injection ---Cabinet Override      morphine 2 mg/mL injection 2 mg Intravenous Q1H PRN   morphine 4 mg/mL injection 4 mg Intravenous Once PRN   morphine 4 mg/mL injection 4 mg Intravenous Q1H PRN   nitroGLYCERIN (NITROSTAT) sublingual tablet  0.4 mg Sublingual Q5 Min PRN   NS premix infusion  Intravenous Continuous   pantoprazole (PROTONIX) 80 mg in NS 100 mL (tot vol) infusion  Intravenous Continuous   pantoprazole (PROTONIX) injection 80 mg Intravenous Once   polyethylene glycol electrolyte (goLYTELY) oral liquid 4 L Oral Now         Lab Work in the Past 24 Hours   Reviewed: Lab Results for Last 24 Hours:  Results for orders placed or performed during the hospital encounter of 07/09/14 (from the past 24 hour(s))   STOOL CULTURE - [facility] ONLY   Result Value Ref Range    CAMPYLOBACTER ANTIGEN NEGATIVE FOR CAMPYLOBACTER ANTIGEN    CDIFF BY PCR - [facility] ONLY   Result Value Ref Range    TOXIGENIC CDIFF NEGATIVE NEGATIVE    027PRESUMPTIVE NEGATIVE NEGATIVE   PT/INR   Result Value Ref Range    PROTHROMBIN TIME 36.7 (U) 9.4 - 12.5 sec    INR 3.30    CBC   Result Value Ref Range    WBC 8.5 4.0 - 11.0 K/uL    RBC 2.44 (L) 4.40 - 5.80 M/uL  HGB 5.1 (LL) 13.5 - 18.0 g/dL    HCT 17.3 (LL) 40.0 - 50.0 %    MCV 70.8 (L) 82.0 - 97.0 fL    MCH 21.1 (L) 28.3 - 34.3 pg    MCHC 29.7 (L) 32.0 - 36.0 g/dL    RDW 20.0 (H) 10.5 - 14.5 %    PLATELET COUNT 197 (D) 150 - 450 K/uL    MPV 8.7 7.4 - 10.5 fL    NRBC 0 0 - 0.6 /100 WBC    NRBC ABSOLUTE 0.00 0 - 0.02 K/uL    PMN % 86.8 (H) 43.0 - 76.0 %    LYMPHOCYTE % 8.8 (L) 15.0 - 43.0 %    MONOCYTE % 4.1 (L) 4.8 - 12.0 %    EOSINOPHIL % 0.0 0.0 - 5.2 %    BASOPHILS % 0.3 0.0 - 2.5 %    PMN # 7.4 (H) 1.5 - 6.5 K/uL    LYMPHOCYTE # 0.7 0.7 - 3.2 K/uL    MONOCYTE # 0.4 0.2 - 0.9 K/uL    EOSINOPHIL # 0.0 0.0 - 0.5 K/uL    BASOPHIL # 0.0 0.0 - 0.1 K/uL   PT/INR   Result Value Ref Range    PROTHROMBIN TIME 21.1 (U) 9.4 - 12.5 sec    INR 1.91    CBC W/ AUTOMATED DIFF - [facility] ONLY   Result Value Ref Range    WBC 8.9 4.0 - 11.0 K/uL    RBC 3.22 (L) 4.40 - 5.80 M/uL    HGB 7.9 (LL) 13.5 - 18.0 g/dL    HCT 24.4 (D) 40.0 - 50.0 %    MCV 76.0 (D) 82.0 - 97.0 fL    MCH 24.6 (L) 28.3 - 34.3 pg    MCHC 32.4 32.0 - 36.0 g/dL    RDW 21.1 (H) 10.5  - 14.5 %    PLATELET COUNT 167 150 - 450 K/uL    MPV 8.4 7.4 - 10.5 fL    NRBC 0 0 - 0.6 /100 WBC    NRBC ABSOLUTE 0.00 0 - 0.02 K/uL    PMN % 84.9 (H) 43.0 - 76.0 %    LYMPHOCYTE % 8.9 (L) 15.0 - 43.0 %    MONOCYTE % 5.8 4.8 - 12.0 %    EOSINOPHIL % 0.0 0.0 - 5.2 %    BASOPHILS % 0.4 0.0 - 2.5 %    PMN # 7.5 (H) 1.5 - 6.5 K/uL    LYMPHOCYTE # 0.8 0.7 - 3.2 K/uL    MONOCYTE # 0.5 0.2 - 0.9 K/uL    EOSINOPHIL # 0.0 0.0 - 0.5 K/uL    BASOPHIL # 0.0 0.0 - 0.1 K/uL   COMPREHENSIVE METABOLIC PROFILE - [facility] ONLY   Result Value Ref Range    GLUCOSE 144 (H) 70 - 110 mg/dL    BUN 21 6 - 22 mg/dL    CREATININE 1.26 0.72 - 1.30 mg/dL    ESTIMATED GLOMERULAR FILTRATION RATE 57 (L) >60 ml/min    SODIUM 138 136 - 145 mmol/L    POTASSIUM 4.5 3.5 - 5.0 mmol/L    CHLORIDE 109 101 - 111 mmol/L    CARBON DIOXIDE 25 22 - 32 mmol/L    ANION GAP 4 3 - 11 mmol/L    CALCIUM 7.7 (L) 8.5 - 10.5 mg/dL    TOTAL PROTEIN 5.2 (L) 6.0 - 8.0 g/dL    ALBUMIN 2.9 (L) 3.2 - 5.0 g/dL    ALBUMIN/GLOBULIN RATIO 1.2  BILIRUBIN, TOTAL 2.1 (H) 0.0 - 1.3 mg/dL    AST (SGOT) 17 0 - 45 IU/L    ALT (SGPT) 13 0 - 63 IU/L    ALKALINE PHOSPHATASE 44 35 - 120 IU/L   POCT FINGERSTICK GLUCOSE   Result Value Ref Range    BLD GLUCOSE POCT 122 (H) 60 - 100 mg/dL   POCT FINGERSTICK GLUCOSE   Result Value Ref Range    BLD GLUCOSE POCT 141 (H) 60 - 100 mg/dL   CBC W/ AUTOMATED DIFF - [facility] ONLY   Result Value Ref Range    WBC 8.8 4.0 - 11.0 K/uL    RBC 2.56 (L) 4.40 - 5.80 M/uL    HGB 6.3 (LL) 13.5 - 18.0 g/dL    HCT 19.4 (LL) 40.0 - 50.0 %    MCV 75.8 (L) 82.0 - 97.0 fL    MCH 24.5 (L) 28.3 - 34.3 pg    MCHC 32.3 32.0 - 36.0 g/dL    RDW 20.9 (H) 10.5 - 14.5 %    PLATELET COUNT 165 150 - 450 K/uL    MPV 8.4 7.4 - 10.5 fL    NRBC 0 0 - 0.6 /100 WBC    NRBC ABSOLUTE 0.00 0 - 0.02 K/uL    PMN % 77.2 (H) 43.0 - 76.0 %    LYMPHOCYTE % 13.0 (L) 15.0 - 43.0 %    MONOCYTE % 8.5 4.8 - 12.0 %    EOSINOPHIL % 0.5 0.0 - 5.2 %    BASOPHILS % 0.8 0.0 - 2.5 %    PMN # 6.8 (H) 1.5 -  6.5 K/uL    LYMPHOCYTE # 1.1 0.7 - 3.2 K/uL    MONOCYTE # 0.7 0.2 - 0.9 K/uL    EOSINOPHIL # 0.0 0.0 - 0.5 K/uL    BASOPHIL # 0.1 0.0 - 0.1 K/uL   COMPREHENSIVE METABOLIC PROFILE - [facility] ONLY   Result Value Ref Range    GLUCOSE 136 (H) 70 - 110 mg/dL    BUN 16 6 - 22 mg/dL    CREATININE 1.17 0.72 - 1.30 mg/dL    ESTIMATED GLOMERULAR FILTRATION RATE >60 >60 ml/min    SODIUM 137 136 - 145 mmol/L    POTASSIUM 4.2 3.5 - 5.0 mmol/L    CHLORIDE 113 (H) 101 - 111 mmol/L    CARBON DIOXIDE 21 (L) 22 - 32 mmol/L    ANION GAP 3 3 - 11 mmol/L    CALCIUM 7.5 (L) 8.5 - 10.5 mg/dL    TOTAL PROTEIN 4.4 (L) 6.0 - 8.0 g/dL    ALBUMIN 2.4 (L) 3.2 - 5.0 g/dL    ALBUMIN/GLOBULIN RATIO 1.2     BILIRUBIN, TOTAL 1.8 (H) 0.0 - 1.3 mg/dL    AST (SGOT) 18 0 - 45 IU/L    ALT (SGPT) 11 0 - 63 IU/L    ALKALINE PHOSPHATASE 38 35 - 120 IU/L   TSH,3RD GENERATION - [facility] ONLY   Result Value Ref Range    TSH 3RD GENERATION 0.250 (D) 0.340 - 5.600 uIU/mL   PT/INR   Result Value Ref Range    PROTHROMBIN TIME 18.7 (H) 9.4 - 12.5 sec    INR 1.69    POCT FINGERSTICK GLUCOSE   Result Value Ref Range    BLD GLUCOSE POCT 140 (H) 60 - 100 mg/dL           Problem List:  Active Hospital Problems   (*Primary Problem)    Diagnosis  Acute GI bleeding           I have reviewed today's labs   I have reviewed today's active medications  I have reviewed today's nursing notes     Cultures which are pending: none     Wife - present and updated         ASSESSMENT AND PLAN:     Primary hospital problem -- Rectal bleeding in the context of over-anticoagulation with coumadin. INR was 10+ and now it is down to 1.7. There is still evidence of bleeding and I will transfused him now. Protonix drip is running now. Thank you, Dr. Lovett Calender and Dr. Rudy Jew.     The plan has changed: no IR services available here and I have just spoken to MICU transger via the [facility] and I am going to send this patient to [facility] via helicopter        Reactive hyperglycemia -- this  patient has never had full-blown diabetes,     Social -- wife is present     Pain -- not an issue     DVT prophylaxis -- no heparin, actively bleeding     Code Status -- [place] -- did not do today     Discharge planning -- transfer to [place]: 60 minutes total time spent coordinating transfer to tertiary care

## 2014-07-10 NOTE — OR Nursing (Signed)
Procedure complete. Abdomen soft. Report given to primary RN. Pt resting comfortably in bed.

## 2014-07-10 NOTE — Progress Notes (Signed)
[facility]   GI Consult  Follow Up Note      Norm, Wray, 64 y.o. male  Date of Service: 07/10/2014  Date of Birth:  22-Apr-1950    Hospital Day:  LOS: 1 day     Chief Complaint:  GI bleed /Very high INR on coumadin/posible cirrhosis  Subjective: No stools.  Objective:  Temperature: 37.2 C (99 F)  Heart Rate: (!) 101  BP (Non-Invasive): (!) 119/57 mmHg  Respiratory Rate: 18  SpO2-1: 98 %  Pain Score (Numeric, Faces): 0  General: appears chronically ill and acutely ill  Lungs: Clear to auscultation bilaterally.   Cardiovascular: regular rate and rhythm  Abdomen: Soft, non-tender, Bowel sounds normal  Skin: Skin warm and dry    Labs:  I have reviewed all lab results.    Imaging Studies:  N/A    Assessment/Recommendations:  Plan colonoscopy 5 pm.Pt to get more prbc and ffp.Start colyte now.

## 2014-07-11 ENCOUNTER — Inpatient Hospital Stay (HOSPITAL_COMMUNITY): Payer: Medicare Other

## 2014-07-11 DIAGNOSIS — Z01810 Encounter for preprocedural cardiovascular examination: Secondary | ICD-10-CM

## 2014-07-11 DIAGNOSIS — D696 Thrombocytopenia, unspecified: Secondary | ICD-10-CM

## 2014-07-11 DIAGNOSIS — J90 Pleural effusion, not elsewhere classified: Secondary | ICD-10-CM

## 2014-07-11 DIAGNOSIS — K639 Disease of intestine, unspecified: Secondary | ICD-10-CM

## 2014-07-11 DIAGNOSIS — J9811 Atelectasis: Secondary | ICD-10-CM

## 2014-07-11 DIAGNOSIS — I361 Nonrheumatic tricuspid (valve) insufficiency: Secondary | ICD-10-CM

## 2014-07-11 DIAGNOSIS — I4891 Unspecified atrial fibrillation: Secondary | ICD-10-CM

## 2014-07-11 DIAGNOSIS — I517 Cardiomegaly: Secondary | ICD-10-CM

## 2014-07-11 DIAGNOSIS — K8020 Calculus of gallbladder without cholecystitis without obstruction: Secondary | ICD-10-CM

## 2014-07-11 DIAGNOSIS — K922 Gastrointestinal hemorrhage, unspecified: Secondary | ICD-10-CM

## 2014-07-11 DIAGNOSIS — Z954 Presence of other heart-valve replacement: Secondary | ICD-10-CM

## 2014-07-11 DIAGNOSIS — I340 Nonrheumatic mitral (valve) insufficiency: Secondary | ICD-10-CM

## 2014-07-11 DIAGNOSIS — Z452 Encounter for adjustment and management of vascular access device: Secondary | ICD-10-CM

## 2014-07-11 LAB — CBC/DIFF
BASOPHILS: 1 %
BASOS ABS: 0.025 THOU/uL (ref 0.000–0.200)
EOS ABS: 0.059 THOU/uL (ref 0.000–0.500)
EOSINOPHIL: 1 %
HCT: 26.7 % — ABNORMAL LOW (ref 36.7–47.0)
HCT: 26.7 % — ABNORMAL LOW (ref 36.7–47.0)
HGB: 8.5 g/dL — ABNORMAL LOW (ref 12.5–16.3)
LYMPHOCYTES: 15 %
LYMPHS ABS: 0.676 THOU/uL — ABNORMAL LOW (ref 1.000–4.800)
MCH: 27.2 pg — ABNORMAL LOW (ref 27.4–33.0)
MCHC: 31.8 g/dL — ABNORMAL LOW (ref 32.5–35.8)
MCV: 85.6 fL (ref 78–100)
MONOCYTES: 8 %
MONOS ABS: 0.371 THOU/uL (ref 0.300–1.000)
MPV: 8.5 fL (ref 7.5–11.5)
PLATELET COUNT: 90 THOU/uL — ABNORMAL LOW (ref 140–450)
PMN ABS: 3.538 10*3/uL (ref 1.500–7.700)
PMN'S: 76 %
RBC: 3.11 MIL/uL — ABNORMAL LOW (ref 4.06–5.63)
RDW: 16.6 % — ABNORMAL HIGH (ref 12.0–15.0)
WBC: 4.7 THOU/uL (ref 3.5–11.0)

## 2014-07-11 LAB — BASIC METABOLIC PANEL
ANION GAP: 4 mmol/L (ref 4–13)
ANION GAP: 7 mmol/L (ref 4–13)
BUN/CREAT RATIO: 15 (ref 6–22)
BUN/CREAT RATIO: 10 (ref 6–22)
BUN: 9 mg/dL (ref 8–25)
BUN: 10 mg/dL (ref 8–25)
CALCIUM: 4.7 mg/dL — CL (ref 8.5–10.4)
CALCIUM: 7.7 mg/dL — ABNORMAL LOW (ref 8.5–10.4)
CARBON DIOXIDE: 16 mmol/L — ABNORMAL LOW (ref 22–32)
CARBON DIOXIDE: 21 mmol/L — ABNORMAL LOW (ref 22–32)
CHLORIDE: 123 mmol/L — ABNORMAL HIGH (ref 96–111)
CHLORIDE: 111 mmol/L (ref 96–111)
CREATININE: 0.62 mg/dL (ref 0.62–1.27)
CREATININE: 0.96 mg/dL (ref 0.62–1.27)
ESTIMATED GLOMERULAR FILTRATION RATE: >59 mL/min/{1.73_m2} (ref >59)
ESTIMATED GLOMERULAR FILTRATION RATE: >59 ml/min/1.73m2 (ref >59)
GLUCOSE,NONFAST: 158 mg/dL — ABNORMAL HIGH (ref 65–139)
GLUCOSE,NONFAST: 184 mg/dL — ABNORMAL HIGH (ref 65–139)
POTASSIUM: 5.5 mmol/L — ABNORMAL HIGH (ref 3.5–5.1)
POTASSIUM: 3.5 mmol/L (ref 3.5–5.1)
SODIUM: 143 mmol/L (ref 136–145)
SODIUM: 139 mmol/L (ref 136–145)

## 2014-07-11 LAB — TRANSTHORACIC ECHOCARDIOGRAM - ADULT
Ao VTI: 33.93 cm
Ao VTI: 35.64 cm
Ao VTI: 43.71 cm
Ao VTI: 46.69 cm
Ao VTI: 54.12 cm
Aortic Valve Systolic Peak Velocity: 174.4 cm/s
Aortic Valve Systolic Peak Velocity: 208.5 cm/s
Aortic Valve Systolic Peak Velocity: 219.9 centimeter/second
Aortic Valve Systolic Peak Velocity: 272.8 cm/s
Aortic Valve Systolic Peak Velocity: 275.6 centimeter/second
Left Ventricle Diastolic Major Axis: 7.68 cm
Left Ventricle Diastolic Major Axis: 8.87 cm
TR Max Vel: 238.5 centimeter/second
TR Max Vel: 241.4 centimeter/second
TR Max Vel: 242.8 cm/s
Tricuspid Valve Regurgitation Velocity Time Interval: 61.92 cm
Tricuspid Valve Regurgitation Velocity Time Interval: 65.68 cm
Tricuspid Valve Regurgitation Velocity Time Interval: 69.06 cm

## 2014-07-11 LAB — PRODUCT: PLATELETS - UNITS

## 2014-07-11 LAB — CBC
HCT: 26.2 % — ABNORMAL LOW (ref 36.7–47.0)
HGB: 8.6 g/dL — ABNORMAL LOW (ref 12.5–16.3)
MCH: 26.6 pg — ABNORMAL LOW (ref 27.4–33.0)
MCHC: 32.9 g/dL (ref 32.5–35.8)
MCHC: 32.9 g/dL (ref 32.5–35.8)
MCV: 81 fL (ref 78–100)
MPV: 8.3 fL (ref 7.5–11.5)
PLATELET COUNT: 126 THOU/uL — ABNORMAL LOW (ref 140–450)
RBC: 3.24 MIL/uL — ABNORMAL LOW (ref 4.06–5.63)
RDW: 18.5 % — ABNORMAL HIGH (ref 12.0–15.0)
WBC: 6.8 THOU/uL (ref 3.5–11.0)

## 2014-07-11 LAB — URINE CULTURE,ROUTINE: CULTURE OBSERVATION: NO GROWTH

## 2014-07-11 LAB — STOOL CULTURE - BMC/JMC ONLY
CAMPYLOBACTER ANTIGEN: NEGATIVE
SHIGA TOXIN 2: NEGATIVE
STOOL CULTURE: NORMAL

## 2014-07-11 LAB — HEPATIC FUNCTION PANEL
ALBUMIN: 1.5 g/dL — ABNORMAL LOW (ref 3.4–4.8)
ALKALINE PHOSPHATASE: 24 U/L (ref <150)
ALT (SGPT): 5 U/L (ref <55)
AST (SGOT): 8 U/L (ref 8–48)
BILIRUBIN, TOTAL: 1.3 mg/dL (ref 0.3–1.3)
BILIRUBIN,CONJUGATED: 0.4 mg/dL — ABNORMAL HIGH (ref <0.3)
TOTAL PROTEIN: 3.1 g/dL — ABNORMAL LOW (ref 6.0–8.0)

## 2014-07-11 LAB — PT/INR
INR: 1.9 — ABNORMAL HIGH (ref 0.80–1.20)
PROTHROMBIN TIME: 21.2 s — ABNORMAL HIGH (ref 9.0–13.4)

## 2014-07-11 LAB — H & H
HCT: 26.3 % — ABNORMAL LOW (ref 36.7–47.0)
HCT: 27.2 % — ABNORMAL LOW (ref 36.7–47.0)
HCT: 27.1 % — ABNORMAL LOW (ref 36.7–47.0)
HGB: 8.6 g/dL — ABNORMAL LOW (ref 12.5–16.3)
HGB: 8.9 g/dL — ABNORMAL LOW (ref 12.5–16.3)
HGB: 8.8 g/dL — ABNORMAL LOW (ref 12.5–16.3)

## 2014-07-11 LAB — ANA (ANTINUCLEAR ANTIBODIES), SERUM
ANTI-NUCLEAR ANTIBODIES, QUALITATIVE: POSITIVE
ANTI-NUCLEAR ANTIBODIES, QUALITATIVE: POSITIVE
ANTI-NUCLEAR ANTIBODIES, QUANTITATIVE: 1.69 {index_val}

## 2014-07-11 LAB — PERFORM POC WHOLE BLOOD GLUCOSE
GLUCOSE, POINT OF CARE: 125 mg/dL — ABNORMAL HIGH (ref 70–105)
GLUCOSE, POINT OF CARE: 128 mg/dL — ABNORMAL HIGH (ref 70–105)
GLUCOSE, POINT OF CARE: 90 mg/dL (ref 70–105)

## 2014-07-11 LAB — PTT (PARTIAL THROMBOPLASTIN TIME): APTT: 32.4 s (ref 25.1–36.5)

## 2014-07-11 LAB — CARCINOEMBRYONIC ANTIGEN: CARCINOEMBRYONIC AG: 4.5 ng/mL (ref <5.0)

## 2014-07-11 LAB — HGA1C (HEMOGLOBIN A1C WITH EST AVG GLUCOSE): ESTIMATED AVERAGE GLUCOSE: 111 mg/dL

## 2014-07-11 LAB — TROPONIN-I: TROPONIN-I: <7 ng/L (ref <30)

## 2014-07-11 MED ORDER — HEPARIN (PORCINE) 25,000 UNIT/250 ML (100 UNIT/ML) IN DEXTROSE 5 % IV
1500.0000 [IU]/h | INTRAVENOUS | Status: DC
Start: 2014-07-11 — End: 2014-07-13
  Administered 2014-07-11 – 2014-07-12 (×3): 1500 [IU]/h via INTRAVENOUS
  Administered 2014-07-12: 2100 [IU]/h via INTRAVENOUS
  Administered 2014-07-12: 1500 [IU]/h via INTRAVENOUS
  Administered 2014-07-12: 2100 [IU]/h via INTRAVENOUS
  Administered 2014-07-12 (×5): 1500 [IU]/h via INTRAVENOUS
  Administered 2014-07-13: 0 [IU]/h via INTRAVENOUS
  Filled 2014-07-11 (×2): qty 25000

## 2014-07-11 MED ORDER — LIDOCAINE (PF) 10 MG/ML (1 %) INJECTION SOLUTION
0.5000 mL | Freq: Once | INTRAMUSCULAR | Status: AC
Start: 2014-07-11 — End: 2014-07-11
  Administered 2014-07-11: 0.5 mL via INTRADERMAL

## 2014-07-11 MED ORDER — CALCIUM GLUCONATE 100 MG/ML (10 %) INTRAVENOUS SOLUTION
1000.0000 mg | INTRAVENOUS | Status: AC
Start: 2014-07-11 — End: 2014-07-11
  Administered 2014-07-11: 1000 mg via INTRAVENOUS
  Filled 2014-07-11: qty 10

## 2014-07-11 MED ORDER — TOBRAMYCIN IV - PHARMACIST TO DOSE PER PROTOCOL
Freq: Every day | Status: DC | PRN
Start: 2014-07-11 — End: 2014-07-11

## 2014-07-11 MED ORDER — SODIUM CHLORIDE 0.9 % (FLUSH) INJECTION SYRINGE
10.0000 mL | INJECTION | Freq: Three times a day (TID) | INTRAMUSCULAR | Status: DC
Start: 2014-07-11 — End: 2014-07-18
  Administered 2014-07-11: 10 mL
  Administered 2014-07-12 (×2): 0 mL
  Administered 2014-07-12 – 2014-07-14 (×4): 10 mL
  Administered 2014-07-14: 0 mL
  Administered 2014-07-14 – 2014-07-16 (×6): 10 mL
  Administered 2014-07-16 – 2014-07-17 (×2): 20 mL
  Administered 2014-07-17: 10 mL
  Administered 2014-07-17: 20 mL
  Administered 2014-07-18: 0 mL

## 2014-07-11 MED ORDER — IOPAMIDOL 300 MG IODINE/ML (61 %) INTRAVENOUS SOLUTION
120.00 mL | INTRAVENOUS | Status: AC
Start: 2014-07-11 — End: 2014-07-11
  Administered 2014-07-11: 120 mL via INTRAVENOUS

## 2014-07-11 MED ORDER — IOPAMIDOL 300 MG IODINE/ML (61 %) INTRAVENOUS SOLUTION
INTRAVENOUS | Status: AC
Start: 2014-07-11 — End: 2014-07-11
  Filled 2014-07-11: qty 2

## 2014-07-11 MED ORDER — TOBRAMYCIN 40 MG/ML INJECTION SOLUTION
2.0000 mg/kg | Freq: Three times a day (TID) | INTRAMUSCULAR | Status: DC
Start: 2014-07-11 — End: 2014-07-11

## 2014-07-11 MED ORDER — HEPARIN, PORCINE (PF) 10 UNIT/ML INTRAVENOUS SYRINGE
2.0000 mL | INJECTION | Freq: Three times a day (TID) | INTRAVENOUS | Status: DC
Start: 2014-07-11 — End: 2014-07-18
  Administered 2014-07-11: 0
  Administered 2014-07-12: 5 mL
  Administered 2014-07-12: 0
  Administered 2014-07-12: 2 mL
  Administered 2014-07-13 (×2): 5 mL
  Administered 2014-07-14: 0
  Administered 2014-07-14 – 2014-07-16 (×6): 5 mL
  Administered 2014-07-16: 2 mL
  Administered 2014-07-16 – 2014-07-17 (×4): 5 mL
  Administered 2014-07-18: 0
  Filled 2014-07-11 (×9): qty 5

## 2014-07-11 MED ORDER — DEXTROSE 5 % IN WATER (D5W) INTRAVENOUS SOLUTION
3.0000 g | Freq: Once | INTRAVENOUS | Status: DC
Start: 2014-07-13 — End: 2014-07-12

## 2014-07-11 MED ORDER — IOPAMIDOL 300 MG IODINE/ML (61 %) INTRAVENOUS SOLUTION
INTRAVENOUS | Status: AC
Start: 2014-07-11 — End: 2014-07-11
  Filled 2014-07-11: qty 1

## 2014-07-11 MED ORDER — SODIUM CHLORIDE 0.9 % (FLUSH) INJECTION SYRINGE
20.0000 mL | INJECTION | INTRAMUSCULAR | Status: DC | PRN
Start: 2014-07-11 — End: 2014-07-18

## 2014-07-11 MED ORDER — MAGNESIUM SULFATE 4 GRAM/100 ML (4 %) IN WATER INTRAVENOUS PIGGYBACK
4.0000 g | INJECTION | INTRAVENOUS | Status: AC
Start: 2014-07-11 — End: 2014-07-11
  Administered 2014-07-11: 4 g via INTRAVENOUS
  Filled 2014-07-11: qty 100

## 2014-07-11 MED ORDER — HEPARIN, PORCINE (PF) 10 UNIT/ML INTRAVENOUS SYRINGE
2.0000 mL | INJECTION | INTRAVENOUS | Status: DC | PRN
Start: 2014-07-11 — End: 2014-07-18
  Filled 2014-07-11: qty 5

## 2014-07-11 MED ADMIN — lidocaine (PF) 10 mg/mL (1 %) injection solution: ORAL | @ 06:00:00

## 2014-07-11 NOTE — Care Management Notes (Signed)
[facility] Management Initial Evaluation    Patient Name: Loudon Krakow  Date of Birth: 1950/11/05  Sex: male  Date/Time of Admission: 07/10/2014  4:09 PM  Room/Bed: MIC1/08  Payor: MEDICARE / Plan: MEDICARE PART A AND B / Product Type: Medicare /   PCP: Viona Gilmore, MD    Pharmacy Info:   Preferred Pharmacy     [facility] [address] [place]    [place] [address]    Phone: [phone] Fax: [phone]    Open 24 Hours?: No        Emergency Contact Info:   Extended Emergency Contact Information  Primary Emergency Contact: Kristian Covey  Address: [address]           [place], [place] [address] [place] Phone: [phone]  Relation: Wife    History:   Keenan Trefry is a 28 y.o., male, admitted for GI bleed    Height/Weight: 193 cm (6' 3.98") / (!) 139.3 kg (307 lb 1.6 oz)     LOS: 1 day   Admitting Diagnosis: GI bleed [K92.2]    Assessment:      07/11/14 1022   Assessment Details   Assessment Type Admission   Date of Care Management Update 07/11/14   Date of Next DCP Update 07/13/14   Care Management Plan   Discharge Planning Status initial meeting   Projected Discharge Date 07/13/14   CM will evaluate for rehabilitation potential yes   Discharge Needs Assessment   Equipment Currently Used at Home none   Equipment Needed After Discharge other (see comments)  (pending evals)   Discharge Facility/Level Of Care Needs Home (Patient/Family Member/other)(code 1)   Transportation Available family or friend will provide  (wife Jackelyn Poling)   Referral Information   Admission Type inpatient   Address Verified verified-no changes   Arrived From acute hospital, other   [place] Verified verified-no change   ADVANCE DIRECTIVES   Does the Patient have an Advance Directive? No, Information Offered and Given   Patient Requests Assistance in Having Advance Directive  Notarized. N/A   Employment/Financial   Patient has Prescription Coverage?  Yes       Name of Insurance Coverage for Medications part D  ([employer])   Financial Concerns none   Living Environment   Select an age group to open "lives with" row.  Adult   Lives With spouse;other relative(s) (specify)  (wife Jackelyn Poling and mother-in-law)   Living Arrangements house   Able to Return to Prior Living Arrangements yes   Home Safety   Home Assessment: No Problems Identified   Home Accessibility stairs within home   Living Environment   Number of [place]  (handrail present)     MSW met with pt and pt wife Debbie at bedside to complete initial assessment.  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  Will follow     Discharge Plan:  Home (Patient/Family Member/other) (code 1)      The patient will continue to be evaluated for developing discharge needs.     Case Manager: Kirke Corin, [place] 07/11/2014, 10:24  Phone: [phone]

## 2014-07-11 NOTE — Care Plan (Signed)
Problem: General Plan of Care(Adult,OB)  Goal: Individualization/Patient Specific Goal(Adult/OB)  Outcome: Ongoing (see interventions/notes)  I visited with this patient while doing rounds on MICU today. Patient was sitting up in chair and spouse was sitting on bed beside him. Patient shared his medical review and wife shared that they are from [place], [place]. They both expressed gratitude for being at [facility] because the previous hospital didn't have the doctors the patient needed to take care of his illness. Both patient and spouse were hopeful and confident in the patient's status at this time. I listened, offered support and educated patient and spouse on the presence of chaplains in the hosptial 24/7.

## 2014-07-11 NOTE — Care Plan (Signed)
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  Will follow

## 2014-07-11 NOTE — Care Plan (Signed)
Problem: General Plan of Care(Adult,OB)  Goal: Plan of Care Review(Adult,OB)  The patient and/or their representative will communicate an understanding of their plan of care   Outcome: Ongoing (see interventions/notes)  Patient on RA saturating 97%. No issues throughout the day. Will follow.

## 2014-07-11 NOTE — Consults (Addendum)
[facility]  Surgery Initial Consult    The Silos, 64 y.o. male  Date of Birth:  11-11-1950  Date of service: 07/11/2014    Information Obtained from: patient  Chief Complaint: colon mass, hematochezia    PCP: Viona Gilmore, MD  Consult Requested By: MICU     HPI: (must include no less than 4 of the following main descriptors) Location (of pain): Quality (character of pain) Severity (minimal, mild, severe, scale or 1-10) Duration (how long has pain/sx present) Timing (when does pain/sx occur)  Context (activity at/before onset) Modifying Factors (what makes pain/sx  Better/worse) Associate Sign/Sx (what accompanies main pain/sx)     Kenneth Weeks is a 64 y.o., White male  With PMH of Marfans, mechanical aortic valve, Afib, anticoagulation, DM, hypothyroidism who presents with a colon mass. Patient states that on Sunday night he began having bloody diarrhea he had several BM that were dark red in appearance and loose. He states that he took some immodium and went to sleep that night thinking it may be hemorrhoids. The following morning he collapsed and his wife took his BP which was 83/46. At this time she called EMS and he was taken to [place] where his Hgb was found to be 5 and his INR was  Supra therapeutic at 10.  He underwent a non contrast CT scan and an EGD which showed evidence of liver cirrhosis and esophagitits on the EGD. He was transferred to [facility] for further mgmt where he underwent INR reversal, received blood products and underwent a colonoscopy on 5/31 which revealed a mass at the 1st fold of the cecum.  As such surgical oncology was consulted    Of note patient has never had a colonoscopy before, no personal or family history of colon cancer. Denies weight loss, appetite changes or stool changes prior to this event.     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       ROS:   MUST comment on all "Abnormal" findings   ROS Other than ROS in the HPI, all other systems were negative.    PAST MEDICAL/ FAMILY/ SOCIAL HISTORY:       Past Medical History   Diagnosis Date    Marfan syndrome     Congestive heart failure     Diabetes mellitus      type ii diet controlled    Wears glasses     Thyroid disease      hypothyroid    Reflux     Hydrocele     Hemorrhage of gastrointestinal tract 5/16    A-fib      Chronic anti-coagulant therapy    Transfusion history      s/p Multiple units PRC/FFP this adm.    Syncope     Hemorrhagic shock     Acute on chronic renal failure     Muscle weakness      Generalized    Hypertension 07/10/2014         No Known Allergies  Medications Prior to Admission     Prescriptions    carvedilol (COREG) 12.5 mg Oral Tablet    Take 1 Tab (12.5 mg total) by mouth Twice daily with food    furosemide (LASIX) 40 mg Oral Tablet    Take 40 mg by mouth Three times a day     levothyroxine (SYNTHROID) 200 mcg Oral  Tablet    take 200 mcg by mouth Once a day.      MAGNESIUM CARBONATE ORAL    Take by mouth Once a day    POTASSIUM GLUCONATE ORAL    Take by mouth Once a day    spironolactone (ALDACTONE) 50 mg Oral Tablet    Take 1 Tab (50 mg total) by mouth Once a day    warfarin (COUMADIN) 7.5 mg Oral Tablet    Take 7.5 mg by mouth Once a day 7.5 mg Monday- Friday and 10 mg Saturday and Sunday           Current Facility-Administered Medications:  carvedilol (COREG) tablet 12.5 mg Oral 2x/day-Food   levothyroxine (SYNTHROID) tablet 200 mcg Oral QAM   pantoprazole (PROTONIX) 80 mg in NS 100 mL infusion  Intravenous Q12H   And      [START ON 07/13/2014] pantoprazole (PROTONIX) delayed release tablet 40 mg Oral Q24H   perflutren lipid microspheres (DEFINITY) 1.1 mg/mL injection 0.3 mL Intravenous Give in Cardiology   SSIP insulin lispro (HUMALOG) 100 units/mL injection 2-6 Units Subcutaneous 4x/day PRN     Past Surgical History   Procedure Laterality Date    Hx heart valve  surgery  1999     [facility]     Hx upper endoscopy  07/09/14    Hx tonsil and adenoidectomy           Family History   Problem Relation Age of Onset    Hypertension Mother          History   Substance Use Topics    Smoking status: Never Smoker     Smokeless tobacco: Never Used    Alcohol Use: No         PHYSICAL EXAMINATION: MUST comment on all "Abnormal" findings    Exam Temperature: 36.5 C (97.7 F)  Heart Rate: 84  BP (Non-Invasive): 103/60 mmHg  Respiratory Rate: 20  SpO2-1: 96 %  Pain Score (Numeric, Faces): 0  General: no distress  Eyes: Conjunctiva clear.  HENT:Head atraumatic and normocephalic  Neck: No JVD or thyromegaly or lymphadenopathy  Lungs: nonlabored on RA  Cardiovascular: regular rate and rhythm  Abdomen: Soft, non-tender, flexiseal in place with dark red liquid stool  Extremities: 1+ edema of BL LE  Skin: Skin warm and dry  Neurologic: Grossly normal      Labs Ordered/ Reviewed (Please indicate ordered or reviewed)   Reviewed: Labs:  Lab Results for Last 24 Hours:    Results for orders placed or performed during the hospital encounter of 07/10/14 (from the past 24 hour(s))   ADULT ROUTINE BLOOD CULTURE, SET OF 2 BOTTLES (BACTERIA AND YEAST)   Result Value Ref Range    SPECIMEN DESCRIPTION BLOOD  LEFT HAND        SPECIAL REQUESTS       1 BacT/ALERT FA and 1 BacT/ALERT SN bottle received    POSITIVE CULTURE NOT REPORTED     CULTURE OBSERVATION NO GROWTH 1 DAY     REPORT STATUS PENDING    TYPE AND SCREEN   Result Value Ref Range    UNITS ORDERED 3     ABO/RH(D) B POSITIVE     ANTIBODY SCREEN NEGATIVE     SPECIMEN EXPIRATION DATE 07/13/2014     UNIT NUMBER [id number]     BLOOD COMPONENT TYPE LR RBC, Adsol1, 04710     UNIT DIVISION 00     STATUS OF UNIT ISSUED,FINAL  TRANSFUSION STATUS OK TO TRANSFUSE     IS CROSSMATCH Electronically Compatible     UNIT NUMBER [id number]     BLOOD COMPONENT TYPE LR RBC, Adsol1, 04710     UNIT DIVISION 00     STATUS OF UNIT ISSUED,FINAL     TRANSFUSION  STATUS OK TO TRANSFUSE     IS CROSSMATCH Electronically Compatible     UNIT NUMBER [id number]     BLOOD COMPONENT TYPE LR RBC, Adsol1, 04710     UNIT DIVISION 00     STATUS OF UNIT ISSUED     TRANSFUSION STATUS OK TO TRANSFUSE     IS CROSSMATCH Electronically Compatible    BASIC METABOLIC PANEL, NON-FASTING   Result Value Ref Range    SODIUM 140 136 - 145 mmol/L    POTASSIUM 3.9 3.5 - 5.1 mmol/L    CHLORIDE 109 96 - 111 mmol/L    CARBON DIOXIDE 22 22 - 32 mmol/L    ANION GAP 9 4 - 13 mmol/L    CREATININE 0.91 0.62 - 1.27 mg/dL    ESTIMATED GLOMERULAR FILTRATION RATE >59 >59 ml/min/1.11m2    GLUCOSE,NONFAST 120 65 - 139 mg/dL    BUN 15 8 - 25 mg/dL    BUN/CREAT RATIO 16 6 - 22    CALCIUM 7.5 (L) 8.5 - 10.4 mg/dL   TROPONIN-I   Result Value Ref Range    TROPONIN-I 7 <30 ng/L   MAGNESIUM   Result Value Ref Range    MAGNESIUM 1.8 1.6 - 2.5 mg/dL   PHOSPHORUS   Result Value Ref Range    PHOSPHORUS 1.4 (L) 2.3 - 4.0 mg/dL   B-TYPE NATRIURETIC PEPTIDE   Result Value Ref Range    B-TYPE NATRIURETIC PEPTIDE 181 (H) <100 pg/mL   HGA1C (HEMOGLOBIN A1C WITH EST AVG GLUCOSE)   Result Value Ref Range    HEMOGLOBIN A1C 5.5 4.0 - 6.0 %    ESTIMATED AVERAGE GLUCOSE 111 mg/dL   LIPID PANEL   Result Value Ref Range    TRIGLYCERIDES 115 <150 mg/dL    CHOLESTEROL 74 <200 mg/dL    HDL-CHOLESTEROL 18 (L) >39 mg/dL    LDL (CALCULATED) 33 <100 mg/dL    VLDL (CALCULATED) 23 <30 mg/dL    NON - HDL (CALCULATED) 56 <190 mg/dL   THYROID STIMULATING HORMONE WITH FREE T4 REFLEX   Result Value Ref Range    THYROID STIMULATING HORMONE WITH FREE T4 REFLEX 0.523 0.350 - 5.000 uIU/mL   CBC/DIFF   Result Value Ref Range    WBC 9.1 3.5 - 11.0 THOU/uL    RBC 2.88 (L) 4.06 - 5.63 MIL/uL    HGB 7.5 (L) 12.5 - 16.3 g/dL    HCT 22.7 (L) 36.7 - 47.0 %    MCV 78.7 78 - 100 fL    MCH 26.0 (L) 27.4 - 33.0 pg    MCHC 33.0 32.5 - 35.8 g/dL    RDW 19.6 (H) 12.0 - 15.0 %    PLATELET COUNT 146 140 - 450 THOU/uL    MPV 8.3 7.5 - 11.5 fL    PMN'S 80 %    PMN ABS  7.293 1.500 - 7.700 THOU/uL    LYMPHOCYTES 12 %    LYMPHS ABS 1.077 1.000 - 4.800 THOU/uL    MONOCYTES 6 %    MONOS ABS 0.583 0.300 - 1.000 THOU/uL    EOSINOPHIL 1 %    EOS ABS 0.094 0.000 - 0.500 THOU/uL    BASOPHILS 1 %  BASOS ABS 0.044 0.000 - 0.200 THOU/uL   PT/INR   Result Value Ref Range    PROTHROMBIN TIME 15.6 (H) 9.0 - 13.4 Sec    INR 1.40 (H) 0.80 - 1.20   PTT (PARTIAL THROMBOPLASTIN TIME)   Result Value Ref Range    APTT 31.3 25.1 - 36.5 Sec   HEPATIC FUNCTION PANEL   Result Value Ref Range    ALBUMIN 2.5 (L) 3.4 - 4.8 g/dL    BILIRUBIN, TOTAL 1.9 (H) 0.3 - 1.3 mg/dL    ALKALINE PHOSPHATASE 46 <150 U/L    AST (SGOT) 16 8 - 48 U/L    ALT (SGPT) 10 <55 U/L    BILIRUBIN,CONJUGATED 0.7 (H) <0.3 mg/dL    TOTAL PROTEIN 5.3 (L) 6.0 - 8.0 g/dL   LACTIC ACID   Result Value Ref Range    LACTIC ACID 1.3 0.5 - 2.2 mmol/L   LIPASE   Result Value Ref Range    LIPASE 27 10 - 80 U/L   AMMONIA   Result Value Ref Range    AMMONIA 49 15 - 50 umol/L   CREATINE KINASE (CK), TOTAL, SERUM   Result Value Ref Range    CREATINE KINASE (CK) 141 45 - 225 U/L   FIBRINOGEN   Result Value Ref Range    FIBRINOGEN 201 200 - 400 mg/dL   POCT WHOLE BLOOD GLUCOSE   Result Value Ref Range    GLUCOSE, POINT OF CARE 124 (H) 70 - 105 mg/dL   ECG 12-LEAD   Result Value Ref Range    Ventricular rate 112 BPM    Atrial Rate 85 BPM    QRS Duration 90 ms    QT Interval 344 ms    QTC Calculation 469 ms    Calculated R Axis 11 degrees    Calculated T Axis 167 degrees   ADULT ROUTINE BLOOD CULTURE, SET OF 2 BOTTLES (BACTERIA AND YEAST)   Result Value Ref Range    SPECIMEN DESCRIPTION BLOOD  RIGHT  HAND        SPECIAL REQUESTS       1 BacT/ALERT FA and 1 BacT/ALERT SN bottle received    POSITIVE CULTURE NOT REPORTED     CULTURE OBSERVATION NO GROWTH 1 DAY     REPORT STATUS PENDING    PRODUCT: PLATELETS   Result Value Ref Range    UNIT NUMBER [id number]     BLOOD COMPONENT TYPE LR pheresis 12780     UNIT DIVISION 00     STATUS OF UNIT ISSUED,FINAL        TRANSFUSION STATUS OK TO TRANSFUSE    URINALYSIS WITH CULTURE REFLEX   Result Value Ref Range    APPEARANCE CLEAR CLEAR    COLOR NORMAL NORMAL    SPECIFIC GRAVITY, URINE 1.019 1.005 - 1.030    GLUCOSE NEGATIVE NEGATIVE mg/dL    BILIRUBIN NEGATIVE NEGATIVE    KETONES 5 (A) NEGATIVE mg/dL    BLOOD SMALL (A) NEGATIVE    PH URINE 6.0 5.0 - 8.0    PROTEIN NEGATIVE NEGATIVE mg/dL    UROBILINOGEN NEGATIVE NEGATIVE mg/dL    NITRITE NEGATIVE NEGATIVE    LEUKOCYTES TRACE (A) NEGATIVE   URINALYSIS, MICROSCOPIC   Result Value Ref Range    RBC'S 9 (H) <6 /HPF    WBC'S 3 <4 /HPF    BACTERIA OCCASIONAL OR LESS OCL^OCCASIONAL OR LESS /hpf    MUCOUS LIGHT LIGHT /LPF    HYALINE CAST 3 0 - 3 /  LPF   TROPONIN-I   Result Value Ref Range    TROPONIN-I <7 <30 ng/L   H & H   Result Value Ref Range    HGB 6.9 (LL) 12.5 - 16.3 g/dL    HCT 21.3 (L) 36.7 - 47.0 %   FERRITIN   Result Value Ref Range    FERRITIN 35 20 - 300 ng/mL   CERULOPLASMIN   Result Value Ref Range    CERULOPLASMIN 24 18 - 45 mg/dL   ALPHA-1-ANTITRYPSIN, SERUM   Result Value Ref Range    ALPHA-1-ANTITRYPSIN 164 90 - 200 mg/dL   POCT WHOLE BLOOD GLUCOSE   Result Value Ref Range    GLUCOSE, POINT OF CARE 128 (H) 70 - 105 mg/dL   TROPONIN-I   Result Value Ref Range    TROPONIN-I <7 <30 ng/L   CBC/DIFF   Result Value Ref Range    WBC 4.7 3.5 - 11.0 THOU/uL    RBC 3.11 (L) 4.06 - 5.63 MIL/uL    HGB 8.5 (L) 12.5 - 16.3 g/dL    HCT 26.7 (L) 36.7 - 47.0 %    MCV 85.6 78 - 100 fL    MCH 27.2 (L) 27.4 - 33.0 pg    MCHC 31.8 (L) 32.5 - 35.8 g/dL    RDW 16.6 (H) 12.0 - 15.0 %    PLATELET COUNT 90 (L) 140 - 450 THOU/uL    MPV 8.5 7.5 - 11.5 fL    PMN'S 76 %    PMN ABS 3.538 1.500 - 7.700 THOU/uL    LYMPHOCYTES 15 %    LYMPHS ABS 0.676 (L) 1.000 - 4.800 THOU/uL    MONOCYTES 8 %    MONOS ABS 0.371 0.300 - 1.000 THOU/uL    EOSINOPHIL 1 %    EOS ABS 0.059 0.000 - 0.500 THOU/uL    BASOPHILS 1 %    BASOS ABS 0.025 0.000 - 0.200 THOU/uL   BASIC METABOLIC PANEL, NON-FASTING   Result Value Ref  Range    SODIUM 143 136 - 145 mmol/L    POTASSIUM 5.5 (H) 3.5 - 5.1 mmol/L    CHLORIDE 123 (H) 96 - 111 mmol/L    CARBON DIOXIDE 16 (L) 22 - 32 mmol/L    ANION GAP 4 4 - 13 mmol/L    CREATININE 0.62 0.62 - 1.27 mg/dL    ESTIMATED GLOMERULAR FILTRATION RATE >59 >59 ml/min/1.49m2    GLUCOSE,NONFAST 158 (H) 65 - 139 mg/dL    BUN 9 8 - 25 mg/dL    BUN/CREAT RATIO 15 6 - 22    CALCIUM 4.7 (LL) 8.5 - 10.4 mg/dL   MAGNESIUM   Result Value Ref Range    MAGNESIUM 1.1 (L) 1.6 - 2.5 mg/dL   PHOSPHORUS   Result Value Ref Range    PHOSPHORUS 3.3 2.3 - 4.0 mg/dL   HEPATIC FUNCTION PANEL   Result Value Ref Range    ALBUMIN 1.5 (L) 3.4 - 4.8 g/dL    BILIRUBIN, TOTAL 1.3 0.3 - 1.3 mg/dL    ALKALINE PHOSPHATASE 24 <150 U/L    AST (SGOT) 8 8 - 48 U/L    ALT (SGPT) 5 <55 U/L    BILIRUBIN,CONJUGATED 0.4 (H) <0.3 mg/dL    TOTAL PROTEIN 3.1 (L) 6.0 - 8.0 g/dL   PT/INR   Result Value Ref Range    PROTHROMBIN TIME 21.2 (H) 9.0 - 13.4 Sec    INR 1.90 (H) 0.80 - 1.20   H & H  Result Value Ref Range    HGB 8.6 (L) 12.5 - 16.3 g/dL    HCT 26.3 (L) 36.7 - 47.0 %   POCT WHOLE BLOOD GLUCOSE   Result Value Ref Range    GLUCOSE, POINT OF CARE 109 (H) 70 - 105 mg/dL   BASIC METABOLIC PANEL, NON-FASTING   Result Value Ref Range    SODIUM 139 136 - 145 mmol/L    POTASSIUM 3.5 3.5 - 5.1 mmol/L    CHLORIDE 111 96 - 111 mmol/L    CARBON DIOXIDE 21 (L) 22 - 32 mmol/L    ANION GAP 7 4 - 13 mmol/L    CREATININE 0.96 0.62 - 1.27 mg/dL    ESTIMATED GLOMERULAR FILTRATION RATE >59 >59 ml/min/1.13m2    GLUCOSE,NONFAST 184 (H) 65 - 139 mg/dL    BUN 10 8 - 25 mg/dL    BUN/CREAT RATIO 10 6 - 22    CALCIUM 7.7 (L) 8.5 - 10.4 mg/dL   MAGNESIUM   Result Value Ref Range    MAGNESIUM 2.4 1.6 - 2.5 mg/dL   H & H   Result Value Ref Range    HGB 8.9 (L) 12.5 - 16.3 g/dL    HCT 27.2 (L) 36.7 - 47.0 %   CARCINOEMBRYONIC ANTIGEN   Result Value Ref Range    CARCINOEMBRYONIC AG 4.5 <5.0 ng/mL         Radiology Tests Ordered/ Reviewed (Please indicate ordered or reviewed)      Reviewed:CT scan from [facility] reviewed. gastroscopy and colonoscopy reviewed , mass on 1st fold of cecum, with ulceration      IMPRESSION:    64 yo male with lower GI bleed secondary to supratherapeutic INR and cecal mass    Recommendations (if Consult)   Will plan for resection while patient is here given that he needs to be anticoagulated secondary to mechanical valve.  Plan to take to OR on 6/3 for laparoscopic right hemicolectomy possible open   CT AB with IV contrast for staging purposes  Formal echo and cardiology consult for pre-op clearance  Need reversal of INR further  Will consult anesthesiology for pre-operative work up  Please obtain CEA level  Will follow pathology results    Tad Moore, DO 07/11/2014, 12:18  Rickard Rhymes, DO  PGY-2 General Surgery  Pager [phone]    I saw and examined the patient.  I reviewed the resident's note.  I agree with the findings and plan of care as documented in the resident's note.  Any exceptions/additions are edited/noted.    Pt with LGIB from supratherapeutic INR and right sided colon mass.    CT CAP without obvious evidence of metastatic disease, although the ability to assess the liver is limited by the lack of IV contrast    The plan is to proceed with laparoscopic/possible open right hemicolectomy on Friday    In the interim, he should remain on a clear liquid diet, obtain a CTAP with IV contrast, have a CEA level checked, have cardiology see him and offer perioperative cardiac risk stratification, have an anesthesia consult and have his remaining coagulopathy (INR 1.9) corrected with FFP. His platelets should continue to be trended and he should be given platelets if they drop below 50K    Gwinda Passe, MD 07/11/2014, 16:00

## 2014-07-11 NOTE — Nurses Notes (Signed)
Dr. Evette Doffing paged to verify starting heparin gtt, said he was having MD that placed the order paged to my phone. No one called. Dr Evette Doffing called back, said he did not want to go against another providers orders since it was her pt. and to start gtt, he would call back if he heard anything different. MD paged again that placed order (Dr. Gildardo Pounds) no return call. Will start heparin gtt when it arrives on unit per MD order. Will continue to monitor pt closely.

## 2014-07-11 NOTE — Nurses Notes (Signed)
2130 - GI at bedside.  Endo Rn x 2 at bedside.  Colonoscopy time out completed. Meds per MAR.  Procedure started.  2153 - Colonoscopy completed.  Flexi seal acceptable to be replaced per MICU Amy and Dr. Aggie Moats.  Pt tolerated well.  VSS throughout scope.  2230 - PRBC transfusion started.  Pt wife at bedside.  Updated on status.  All questions answered.  0300- AM lab results discussed with MICU resident Amy.

## 2014-07-11 NOTE — Nurses Notes (Signed)
Assess completed --pt is very pleasant man vss no resp distress echo done and family at bedside

## 2014-07-11 NOTE — Nurses Notes (Signed)
Picc at bedside to place line

## 2014-07-11 NOTE — Consults (Addendum)
[facility]  Cardiology Consult  Initial Consultation      Kenneth Weeks, Kenneth Weeks, 64 y.o. male  Date of service: 07/11/2014  Date of Birth:  [birth year]  Requestion Faculty: Dr. 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 M with PMH of Marfan's syndrome with 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 Preoperative Evaluation:     - Given patient has mechanical aortic valve, risk of thrombus and subsequent thromboembolic event is very high without anticoagulation.  Recommend that surgery be done as soon as possible to reduce time without anticoagulation.  Perioperative antibiotics which include coverage for MRSA should be given.  Risk is intermediate but acceptable given urgency of procedure.   - continue beta-blockade with carvedilol   - recommend starting low-dose heparin drip until surgery can be performed if bleeding risk acceptably low   - restart coumadin as soon as patient can tolerate PO or has NG as this will take several days to become therapeutic   - Start heparin drip postoperatively as soon as homeostasis is achieved postoperatively and if felt to be safe in order to bridge until coumadin is therapeutic    - Atrial fibrillation rate controlled on current regimen of carvedilol.     - As patient was recently hyperkalemic, and BP has been low, continue to hold spironolactone and can consider restarting after acutely ill and as BP and K tolerate.     Revised Cardiac Risk Index:  Six independent predictors of major cardiac complications   1) High-risk type of surgery (examples include vascular surgery and any open  intraperitoneal or intrathoracic procedures)   1 Points:   2) History of ischemic heart disease (history of MI or a positive exercise test, current complaint of chest pain considered to be secondary to myocardial ischemia, use of nitrate therapy, or ECG with pathological Q waves; do not count prior coronary revascularization procedure unless one of the other criteria for ischemic heart disease is present)   0 Points:   3) History of HF   0 Points: pending TTE, but no history of CHF per patient history  4) History of cerebrovascular disease   0 Points:   5) Diabetes mellitus requiring treatment with insulin   0 Points:   6) Preoperative serum creatinine >2.0 mg/dL (177 mol/L)  0 Points:   Total Points 1(if CHF on TTE, possibly higher)    Rate of cardiac death, nonfatal myocardial infarction, and nonfatal cardiac arrest  One risk factor - 1.0 percent (95% CI: 0.5-1.4)     HPI:  64 y.o. M with PMH of Marfan's syndrome with [employer] mechanical AVR 17 years ago in [place], [place] with aortic graft on coumadin, also PMH of Atrial Fibrillation, Hypothyroidism admitted to MICU from outside hospital with significant lower GI bleed.  INR at time of initial presentation was > 10.  Patient states he was recently placed on ciprofloxacin for a suspected cellulitis of his testicle shortly before presenting.  He required 8u PRBC since presentation and also received vitamin K and FFP at [facility] initially for the elevated INR.  Platelet transfusion given here at [facility]. Bleeding for the most  part has subsided, and colonoscopy revealed ileocecal mass as source for bleeding.  Surg onc is planning on removing the mass on 07/13/14.  Patient is not currently on anticoagulation and INR today was 1.9 (yesterday 1.4).  Patient seems to also have cirrhosis on imaging and platelets have been low.  Last Hb stable at 8.9.  Patient has no history of CHF, CAD.  He does have a. Fib which he has had for about 3 years. He denies any chest pain or increasing  SOB.  He did have 1 episode of diffuse peripheral edema after starting a new medication (allegra), which has since resolved.  He does not know when his last TTE was, but it has been many years. He does not currently have a cardiologist.     Cardiac Risk Factors:  hypertension  Past Medical History   Diagnosis Date    Marfan syndrome     Congestive heart failure     Diabetes mellitus      type ii diet controlled    Wears glasses     Thyroid disease      hypothyroid    Reflux     Hydrocele     Hemorrhage of gastrointestinal tract 5/16    A-fib      Chronic anti-coagulant therapy    Transfusion history      s/p Multiple units PRC/FFP this adm.    Syncope     Hemorrhagic shock     Acute on chronic renal failure     Muscle weakness      Generalized    Hypertension 07/10/2014         Past Surgical History   Procedure Laterality Date    Hx heart valve surgery  1999     st judes     Hx upper endoscopy  07/09/14    Hx tonsil and adenoidectomy           Prior to Admission Medications:  Medications Prior to Admission     Prescriptions    carvedilol (COREG) 12.5 mg Oral Tablet    Take 1 Tab (12.5 mg total) by mouth Twice daily with food    furosemide (LASIX) 40 mg Oral Tablet    Take 40 mg by mouth Three times a day     levothyroxine (SYNTHROID) 200 mcg Oral Tablet    take 200 mcg by mouth Once a day.      MAGNESIUM CARBONATE ORAL    Take by mouth Once a day    POTASSIUM GLUCONATE ORAL    Take by mouth Once a day    spironolactone (ALDACTONE) 50 mg Oral Tablet    Take 1 Tab (50 mg total) by mouth Once a day    warfarin (COUMADIN) 7.5 mg Oral Tablet    Take 7.5 mg by mouth Once a day 7.5 mg Monday- Friday and 10 mg Saturday and Sunday        Inpatient Medications:    Current Facility-Administered Medications:  carvedilol (COREG) tablet 12.5 mg Oral 2x/day-Food   levothyroxine (SYNTHROID) tablet 200 mcg Oral QAM   pantoprazole (PROTONIX) 80 mg in NS 100 mL infusion  Intravenous Q12H   And      [START ON 07/13/2014]  pantoprazole (PROTONIX) delayed release tablet 40 mg Oral Q24H   SSIP insulin lispro (HUMALOG) 100 units/mL injection 2-6 Units Subcutaneous 4x/day PRN     No Known Allergies  Dye Allergy:  No  Iodine Allergy:  No    Family History  Family History   Problem Relation Age of Onset    Hypertension Mother          Social History  History   Substance Use Topics    Smoking status: Never Smoker     Smokeless tobacco: Never Used    Alcohol Use: No        ROS: Other than ROS in the HPI, all other systems were negative.    Exam:  Temperature: 36.4 C (97.5 F)  Heart Rate: 85  BP (Non-Invasive): 117/71 mmHg  Respiratory Rate: (!) 22  SpO2-1: 98 %  Pain Score (Numeric, Faces): 0  Constitutional: NAD. Orientedx3. Marfanoid habitus  HENT:   Head: Normocephalic and atraumatic.   Mouth/Throat: Mucous membranes moist.   Eyes: EOMI, PERRL   Neck: Trachea midline. Neck supple.  Cardiovascular: RRR, Diastolic click of mechanical aortic valve appreciated.   Pulmonary/Chest: BS equal bilaterally. No respiratory distress. No wheezes, rales or rhonchi.   Abdominal: BS +. Abdomen soft, nontender, nondistended.  No rebound or guarding.               Musculoskeletal: No edema, tenderness or deformity.  Skin: Warm and dry. No rash, erythema, pallor or cyanosis  Psychiatric: Normal mood and affect. Behavior is normal.   Neurological: Alert. Grossly intact.          Labs:    Lab Results for Last 24 Hours:    Results for orders placed or performed during the hospital encounter of 07/10/14 (from the past 24 hour(s))   ADULT ROUTINE BLOOD CULTURE, SET OF 2 BOTTLES (BACTERIA AND YEAST)   Result Value Ref Range    SPECIMEN DESCRIPTION BLOOD  RIGHT  HAND        SPECIAL REQUESTS       1 BacT/ALERT FA and 1 BacT/ALERT SN bottle received    POSITIVE CULTURE NOT REPORTED     CULTURE OBSERVATION NO GROWTH 1 DAY     REPORT STATUS PENDING    PRODUCT: PLATELETS   Result Value Ref Range    UNIT NUMBER [id number]     BLOOD COMPONENT TYPE LR pheresis  12780     UNIT DIVISION 00     STATUS OF UNIT ISSUED,FINAL     TRANSFUSION STATUS OK TO TRANSFUSE    URINALYSIS WITH CULTURE REFLEX   Result Value Ref Range    APPEARANCE CLEAR CLEAR    COLOR NORMAL NORMAL    SPECIFIC GRAVITY, URINE 1.019 1.005 - 1.030    GLUCOSE NEGATIVE NEGATIVE mg/dL    BILIRUBIN NEGATIVE NEGATIVE    KETONES 5 (A) NEGATIVE mg/dL    BLOOD SMALL (A) NEGATIVE    PH URINE 6.0 5.0 - 8.0    PROTEIN NEGATIVE NEGATIVE mg/dL    UROBILINOGEN NEGATIVE NEGATIVE mg/dL    NITRITE NEGATIVE NEGATIVE    LEUKOCYTES TRACE (A) NEGATIVE   URINALYSIS, MICROSCOPIC   Result Value Ref Range    RBC'S 9 (H) <6 /HPF    WBC'S 3 <4 /HPF    BACTERIA OCCASIONAL OR LESS OCL^OCCASIONAL OR LESS /hpf    MUCOUS LIGHT LIGHT /LPF    HYALINE CAST 3 0 - 3 /LPF   URINE CULTURE,ROUTINE   Result Value Ref Range    SPECIMEN DESCRIPTION CLEAN CATCH URINE     SPECIAL REQUESTS NONE     GRAM STAIN NOT REPORTED     CULTURE OBSERVATION NO GROWTH     REPORT STATUS 07/11/2014  FINAL      TROPONIN-I  Result Value Ref Range    TROPONIN-I <7 <30 ng/L   H & H   Result Value Ref Range    HGB 6.9 (LL) 12.5 - 16.3 g/dL    HCT 21.3 (L) 36.7 - 47.0 %   ANA (ANTINUCLEAR ANTIBODIES), SERUM   Result Value Ref Range    ANTI-NUCLEAR ANTIBODIES, QUALITATIVE       POSITIVE  ANA reactive using an EIA-based screening method. Follow-up testing  with dsDNA, extractable nuclear antigens, centromere, and ribosome P  antibodies suggested as clinically indicated.  Literature shows that  healthy patients can be ANA-positive.  Consider diagnostic criteria  carefully.      ANTI-NUCLEAR ANTIBODIES, QUANTITATIVE 1.69 Index Value   FERRITIN   Result Value Ref Range    FERRITIN 35 20 - 300 ng/mL   CERULOPLASMIN   Result Value Ref Range    CERULOPLASMIN 24 18 - 45 mg/dL   ALPHA-1-ANTITRYPSIN, SERUM   Result Value Ref Range    ALPHA-1-ANTITRYPSIN 164 90 - 200 mg/dL   POCT WHOLE BLOOD GLUCOSE   Result Value Ref Range    GLUCOSE, POINT OF CARE 128 (H) 70 - 105 mg/dL   TROPONIN-I      Result Value Ref Range    TROPONIN-I <7 <30 ng/L   CBC/DIFF   Result Value Ref Range    WBC 4.7 3.5 - 11.0 THOU/uL    RBC 3.11 (L) 4.06 - 5.63 MIL/uL    HGB 8.5 (L) 12.5 - 16.3 g/dL    HCT 26.7 (L) 36.7 - 47.0 %    MCV 85.6 78 - 100 fL    MCH 27.2 (L) 27.4 - 33.0 pg    MCHC 31.8 (L) 32.5 - 35.8 g/dL    RDW 16.6 (H) 12.0 - 15.0 %    PLATELET COUNT 90 (L) 140 - 450 THOU/uL    MPV 8.5 7.5 - 11.5 fL    PMN'S 76 %    PMN ABS 3.538 1.500 - 7.700 THOU/uL    LYMPHOCYTES 15 %    LYMPHS ABS 0.676 (L) 1.000 - 4.800 THOU/uL    MONOCYTES 8 %    MONOS ABS 0.371 0.300 - 1.000 THOU/uL    EOSINOPHIL 1 %    EOS ABS 0.059 0.000 - 0.500 THOU/uL    BASOPHILS 1 %    BASOS ABS 0.025 0.000 - 0.200 THOU/uL   BASIC METABOLIC PANEL, NON-FASTING   Result Value Ref Range    SODIUM 143 136 - 145 mmol/L    POTASSIUM 5.5 (H) 3.5 - 5.1 mmol/L    CHLORIDE 123 (H) 96 - 111 mmol/L    CARBON DIOXIDE 16 (L) 22 - 32 mmol/L    ANION GAP 4 4 - 13 mmol/L    CREATININE 0.62 0.62 - 1.27 mg/dL    ESTIMATED GLOMERULAR FILTRATION RATE >59 >59 ml/min/1.60m2    GLUCOSE,NONFAST 158 (H) 65 - 139 mg/dL    BUN 9 8 - 25 mg/dL    BUN/CREAT RATIO 15 6 - 22    CALCIUM 4.7 (LL) 8.5 - 10.4 mg/dL   MAGNESIUM   Result Value Ref Range    MAGNESIUM 1.1 (L) 1.6 - 2.5 mg/dL   PHOSPHORUS   Result Value Ref Range    PHOSPHORUS 3.3 2.3 - 4.0 mg/dL   HEPATIC FUNCTION PANEL   Result Value Ref Range    ALBUMIN 1.5 (L) 3.4 - 4.8 g/dL    BILIRUBIN, TOTAL 1.3 0.3 - 1.3 mg/dL    ALKALINE PHOSPHATASE 24 <  150 U/L    AST (SGOT) 8 8 - 48 U/L    ALT (SGPT) 5 <55 U/L    BILIRUBIN,CONJUGATED 0.4 (H) <0.3 mg/dL    TOTAL PROTEIN 3.1 (L) 6.0 - 8.0 g/dL   PT/INR   Result Value Ref Range    PROTHROMBIN TIME 21.2 (H) 9.0 - 13.4 Sec    INR 1.90 (H) 0.80 - 1.20   H & H   Result Value Ref Range    HGB 8.6 (L) 12.5 - 16.3 g/dL    HCT 26.3 (L) 36.7 - 47.0 %   POCT WHOLE BLOOD GLUCOSE   Result Value Ref Range    GLUCOSE, POINT OF CARE 109 (H) 70 - 105 mg/dL   BASIC METABOLIC PANEL, NON-FASTING   Result  Value Ref Range    SODIUM 139 136 - 145 mmol/L    POTASSIUM 3.5 3.5 - 5.1 mmol/L    CHLORIDE 111 96 - 111 mmol/L    CARBON DIOXIDE 21 (L) 22 - 32 mmol/L    ANION GAP 7 4 - 13 mmol/L    CREATININE 0.96 0.62 - 1.27 mg/dL    ESTIMATED GLOMERULAR FILTRATION RATE >59 >59 ml/min/1.76m2    GLUCOSE,NONFAST 184 (H) 65 - 139 mg/dL    BUN 10 8 - 25 mg/dL    BUN/CREAT RATIO 10 6 - 22    CALCIUM 7.7 (L) 8.5 - 10.4 mg/dL   MAGNESIUM   Result Value Ref Range    MAGNESIUM 2.4 1.6 - 2.5 mg/dL   H & H   Result Value Ref Range    HGB 8.9 (L) 12.5 - 16.3 g/dL    HCT 27.2 (L) 36.7 - 47.0 %   CARCINOEMBRYONIC ANTIGEN   Result Value Ref Range    CARCINOEMBRYONIC AG 4.5 <5.0 ng/mL   POCT WHOLE BLOOD GLUCOSE   Result Value Ref Range    GLUCOSE, POINT OF CARE 125 (H) 70 - 105 mg/dL   H & H   Result Value Ref Range    HGB 8.8 (L) 12.5 - 16.3 g/dL    HCT 27.1 (L) 36.7 - 47.0 %   POCT WHOLE BLOOD GLUCOSE   Result Value Ref Range    GLUCOSE, POINT OF CARE 90 70 - 105 mg/dL   TRANSTHORACIC ECHOCARDIOGRAM - ADULT   Result Value Ref Range    Left Ventricle Diastolic Major Axis 2.70 cm    Left Ventricle Diastolic Major Axis 3.50 cm    Ao VTI 46.69 cm    Ao VTI 54.12 cm    Ao VTI 33.93 cm    Ao VTI 35.64 cm    Ao VTI 43.71 cm    Aortic Valve Systolic Peak Velocity 093.81 centimeter/second    Aortic Valve Systolic Peak Velocity 829.93 centimeter/second    Aortic Valve Systolic Peak Velocity 716.96 centimeter/second    Aortic Valve Systolic Peak Velocity 789.38 centimeter/second    Aortic Valve Systolic Peak Velocity 101.75 centimeter/second    Tricuspid Valve Regurgitation Velocity Time Interval 69.06 cm    Tricuspid Valve Regurgitation Velocity Time Interval 61.92 cm    Tricuspid Valve Regurgitation Velocity Time Interval 65.68 cm    TR Max Vel 242.80 centimeter/second    TR Max Vel 241.40 centimeter/second    TR Max Vel 238.50 centimeter/second     Imaging Studies:    Results for orders placed or performed during the hospital encounter of 07/10/14  (from the past 24 hour(s))   CT ABDOMEN PELVIS W/WO IV CONTRAST  Status: None    Narrative    PATIENT: Kenneth Weeks    CLINICAL HISTORY: 64 y.o. male with cirrhotic liver, ileocecal mass, acute   lower GI bleed.    TECHNIQUE: CT ABDOMEN PELVIS W/WO IV CONTRAST performed on Jul 11, 2014   12:18 PM.    INTRAVENOUS CONTRAST: 168ml's of Isovue 300.      Findings: There is small bilateral pleural effusions with associated   atelectasis.  There is the 4 mm nodule within the right lower lobe series   5 image 2.  Followup with CT chest in 6-12 months is recommended.  There   is an additional nodule at approximately 2 mm on series 5 image 2 within   the right upper lobe inferiorly.  There is cholelithiasis and mild gallbladder wall thickening.  There is no   biliary ductal dilatation.  No evidence for hepatic mass on this single   phase study.  No specific evidence for sclerotic or fibrotic change.    There is moderate atrophic change of the pancreas.  Spleen is normal size.    There are small porta hepatis lymph nodes which are likely reactive.    There is no hydronephrosis or hydroureter.  There is no evidence for bowel obstruction.  No evidence for significant   bowel wall thickening in a patient with known mass within the proximal   right colon.  The appendix is unremarkable.  There is diverticulosis in   the sigmoid colon without evidence of complicated disease.  There is mild mesenteric stranding and a minimal amount of free fluid   within the pelvis.  There is a rectal tube in place.  There is a left   inguinal hernia containing only fat.  There are scattered small mesenteric   and retroperitoneal lymph nodes without significant lymphadenopathy by   size criteria.  There are degenerative changes in the spine without evidence for acute   bony abnormality.      Impression       1.Small bilateral pleural effusions.  2 pulmonary nodules, largest   measuring 4 mm.  Followup with CT chest in 6-12 months is  recommended.  2. Cholelithiasis with mild gallbladder wall thickening.  If there is   concern for acute cholecystitis further evaluation with ultrasound is   recommended.  3.  Patient's known cecal mass is not visualized.  No evidence of   obstruction.  Diverticulosis without evidence for complicated disease.  4.  Mild nonspecific mesenteric stranding and minimal  free fluid within   the pelvis.         Previous echo results:  N/a  Previous ECG results:  N/a  Previous cath results:  N/a  Previous stess results:  N/a    Jake Bathe, MD 07/11/2014, 15:45        I saw and examined the patient.  I reviewed the resident's note.  I agree with the findings and plan of care as documented in the resident's note.  Any exceptions/additions are edited/noted.    Esther Hardy, MD 07/11/2014, 16:59

## 2014-07-11 NOTE — Nurses Notes (Signed)
Pt voided 400cc in urinal

## 2014-07-11 NOTE — Discharge Instructions (Signed)
PICC DISCHARGE INSTRUCTIONS      PICC Information:  5 FR Power PICC, Bard  with 2 lumen(s).  Placed in Left, Central, Basilic Vein with tip location of Lower 1/3 of SVC.   Total Catheter length is 55cm.  External Catheter length is 4cm.  Placed on 07/11/2014  Placed by [facility] PICC Team: [phone] ext: [phone]     Flush Instruction:  use only a 10ML syringe; flush with a push; pause motion and 10ML saline, then Heparin 2ML (10UNITS/ML) daily    Blood Sampling Instructions  Flush 37ml saline, waste 51ml, obtain specimen, flush 10-37ml saline, then heparin 33ml(10U/ml) and For Dual Lumen PICC, draw specimen from distal port after stopping infusion for 2 minutes    Dressing Care Instructions:  Stat Lock securement device should be changed weekly with dressing change and as needed.  Change PICC cap/valve weekly and as needed, Use sterile technique, cover PICC and the device securement with transparent dressing at least weekly and as needed. Change first PICC dressing on DATE: 07/18/2014      Peripherally Inserted Central Catheter (PICC)   Home Guide     A peripherally inserted central catheter (PICC) is a long, thin, flexible tube that is inserted into a vein in the upper arm. It is a form of intravenous (IV) access. It is considered to be a "central" line because the tip of the PICC ends in a large vein in your chest. This large vein is called the superior vena cava (SVC). The PICC tip ends in the SVC because there is a lot of blood flow in the SVC. This allows medicines and IV fluids to be quickly distributed throughout the body. The PICC is inserted using a sterile technique by a specially trained nurse or physician. After the PICC is inserted, a chest X-ray is done or a vascular positioning system is used to be sure it is in the correct place.   A PICC may be placed for different reasons, such as:   To give medicines and liquid nutrition that can only be given through a central line. Examples are:   Certain  antibiotic treatments.   Chemotherapy.   Total parenteral nutrition (TPN).   To take frequent blood samples.   To give IV fluids and blood products.   If there is difficulty placing a peripheral intravenous (PIV) catheter.  If taken care of properly, a PICC can remain in place for several months. A PICC can also allow patients to go home early. Medicine and PICC care can be managed at home by a family member or home healthcare team.   RISKS AND COMPLICATIONS   Possible problems with a PICC can occasionally occur. This may include:   A clot (thrombus) forming in or at the tip of the PICC. This can cause the PICC to become clogged. A "clot-busting" medicine called tissue plasminogen activator (tPA) can be inserted into the PICC to help break up the clot.   Inflammation of the vein (phlebitis) in which the PICC is placed. Signs of inflammation may include redness, pain at the insertion site, red streaks, or being able to feel a "cord" in the vein where the PICC is located.   Infection in the PICC or at the insertion site. Signs of infection may include fever, chills, redness, swelling, or pus drainage from the PICC insertion site.   PICC movement (malposition). The PICC tip may migrate from its original position due to excessive physical activity, forceful coughing, sneezing, or  vomiting.   A break or cut in the PICC. It is important to not use scissors near the PICC.   Nerve or tendon irritation or injury during PICC insertion.  HOME CARE INSTRUCTIONS   Activity   You may bend your arm and move it freely. If your PICC is near or at the bend of your elbow, avoid activity with repeated motion at the elbow.   Avoid lifting heavy objects as instructed by your caregiver.   Avoid using a crutch with the arm on the same side as your PICC. You may need to use a walker.  PICC Dressing   Keep your PICC bandage (dressing) clean and dry to prevent infection.   Ask your caregiver when you may shower. Ask your caregiver to teach  you how to wrap the PICC when you do take a shower.   Do not bathe, swim, or use hot tubs when you have a PICC.   Change the PICC dressing as instructed by your caregiver.   Change your PICC dressing if it becomes loose or wet.  General PICC Care   Check the PICC insertion site daily for leakage, redness, swelling, or pain.   Flush the PICC as directed by your caregiver. Let your caregiver know right away if the PICC is difficult to flush or does not flush. Do not use force to flush the PICC.   Do not use a syringe that is less than 10 mLs to flush the PICC.   Never pull or tug on the PICC.   Avoid blood pressure checks on the arm with the PICC.   Keep your PICC identification card with you at all times.   Do not take the PICC out yourself. Only a trained clinical professional should remove the PICC.  SEEK IMMEDIATE MEDICAL CARE IF:   Your PICC is accidently pulled all the way out. If this happens, cover the insertion site with a bandage or gauze dressing. Do not throw the PICC away. Your caregiver will need to inspect it.   Your PICC was tugged or pulled and has partially come out. Do not push the PICC back in.   There is any type of drainage, redness, or swelling where the PICC enters the skin.   You cannot flush the PICC, it is difficult to flush, or the PICC leaks around the insertion site when it is flushed.   You hear a "flushing" sound when the PICC is flushed.   You have pain, discomfort, or numbness in your arm, shoulder, or jaw on the same side as the PICC .   You feel your heart "racing" or skipping beats.   You notice a hole or tear in the PICC.   You develop chills or a fever.  MAKE SURE YOU:   Understand these instructions.   Will watch your condition.   Will get help right away if you are not doing well or get worse.  Document Released: 08/02/2002 Document Revised: 04/20/2011 Document Reviewed: 06/02/2010   [facility] Patient Information 2014 [place], [place].

## 2014-07-11 NOTE — Ancillary Notes (Signed)
07/11/14 1015   Clinical Encounter Type   Reason for Visit General Spiritual Care Visit   Referral From self   Declined Spiritual Care Visit At This Time No   Visited With Patient;Spouse   Patient Spiritual Encounters   Spiritual Assessment Courage/Confidence;Able to find meaning/acceptance   Interventions Provided supportive presence;Offered empathy;Facilitated story telling   Outcomes Spiritual Care relationship established;Patient shared/processed his/her own story   How far is the patient from home?  [place]   Family Spiritual Encounters   Spiritual Assessment Hopeful;Courage/Confidence   Interventions Provided supportive presence;Offered empathy;Facilitated story telling   Outcomes Spiritual Care relationship established   [facility]  Spiritual Care Note    Patient Name:  Kenneth Weeks  Date of Encounter:   07/11/2014    Other Pertinent Information: I visited with this patient while doing rounds on MICU today. Patient was sitting up in chair and spouse was sitting on bed beside him. Patient shared his medical review and wife shared that they are from [place], [place]. They both expressed gratitude for being at [facility] because the previous hospital didn't have the doctors the patient needed to take care of his illness. Both patient and spouse were hopeful and confident in the patient's status at this time. I listened, offered support and educated patient and spouse on the presence of chaplains in the hosptial 24/7.           Noe Gens  Pager: [phone]  Total Time of Encounter: 41min.
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
## 2014-07-12 DIAGNOSIS — K7460 Unspecified cirrhosis of liver: Secondary | ICD-10-CM

## 2014-07-12 DIAGNOSIS — I10 Essential (primary) hypertension: Secondary | ICD-10-CM

## 2014-07-12 DIAGNOSIS — Q87418 Marfan's syndrome with other cardiovascular manifestations: Secondary | ICD-10-CM

## 2014-07-12 LAB — BASIC METABOLIC PANEL
ANION GAP: 6 mmol/L (ref 4–13)
BUN/CREAT RATIO: 9 (ref 6–22)
BUN: 8 mg/dL (ref 8–25)
CALCIUM: 7.9 mg/dL — ABNORMAL LOW (ref 8.5–10.4)
CARBON DIOXIDE: 22 mmol/L (ref 22–32)
CHLORIDE: 108 mmol/L (ref 96–111)
CREATININE: 0.85 mg/dL (ref 0.62–1.27)
ESTIMATED GLOMERULAR FILTRATION RATE: >59 mL/min/{1.73_m2} (ref >59)
GLUCOSE,NONFAST: 102 mg/dL (ref 65–139)
POTASSIUM: 3.6 mmol/L (ref 3.5–5.1)
SODIUM: 136 mmol/L (ref 136–145)

## 2014-07-12 LAB — H & H
HCT: 24.8 % — ABNORMAL LOW (ref 36.7–47.0)
HCT: 26.8 % — ABNORMAL LOW (ref 36.7–47.0)
HCT: 26.5 % — ABNORMAL LOW (ref 36.7–47.0)
HGB: 8.2 g/dL — ABNORMAL LOW (ref 12.5–16.3)
HGB: 8.8 g/dL — ABNORMAL LOW (ref 12.5–16.3)
HGB: 8.7 g/dL — ABNORMAL LOW (ref 12.5–16.3)

## 2014-07-12 LAB — CBC/DIFF
BASOPHILS: 0 %
BASOS ABS: 0.022 10*3/uL (ref 0.000–0.200)
EOS ABS: 0.065 THOU/uL (ref 0.000–0.500)
EOSINOPHIL: 1 %
HCT: 25.7 % — ABNORMAL LOW (ref 36.7–47.0)
HGB: 8.4 g/dL — ABNORMAL LOW (ref 12.5–16.3)
LYMPHOCYTES: 14 %
LYMPHS ABS: 0.845 THOU/uL — ABNORMAL LOW (ref 1.000–4.800)
MCH: 26.5 pg — ABNORMAL LOW (ref 27.4–33.0)
MCHC: 32.7 g/dL (ref 32.5–35.8)
MCV: 81.2 fL (ref 78–100)
MONOCYTES: 7 %
MONOS ABS: 0.457 10*3/uL (ref 0.300–1.000)
MONOS ABS: 0.457 THOU/uL (ref 0.300–1.000)
MPV: 8.4 fL (ref 7.5–11.5)
PLATELET COUNT: 122 THOU/uL — ABNORMAL LOW (ref 140–450)
PMN ABS: 4.846 THOU/uL (ref 1.500–7.700)
PMN'S: 78 %
RBC: 3.16 MIL/uL — ABNORMAL LOW (ref 4.06–5.63)
RDW: 18.8 % — ABNORMAL HIGH (ref 12.0–15.0)
WBC: 6.2 10*3/uL (ref 3.5–11.0)

## 2014-07-12 LAB — ECG 12-LEAD
Atrial Rate: 147 {beats}/min
Atrial Rate: 110 {beats}/min
Calculated T Axis: -174 degrees
QRS Duration: 88 ms
QRS Duration: 90 ms
QT Interval: 368 ms
QTC Calculation: 509 ms
QTC Calculation: 477 ms
Ventricular rate: 123 {beats}/min
Ventricular rate: 123 {beats}/min

## 2014-07-12 LAB — TYPE AND CROSS RED CELLS - BMC/JMC ONLY

## 2014-07-12 LAB — PTT (PARTIAL THROMBOPLASTIN TIME)
APTT: 54 s — ABNORMAL HIGH (ref 25.1–36.5)
APTT: 58.7 s — ABNORMAL HIGH (ref 25.1–36.5)
APTT: 64.3 s — ABNORMAL HIGH (ref 25.1–36.5)
APTT: 39.2 s — ABNORMAL HIGH (ref 25.1–36.5)

## 2014-07-12 LAB — HEPATIC FUNCTION PANEL
ALBUMIN: 2.4 g/dL — ABNORMAL LOW (ref 3.4–4.8)
ALBUMIN: 2.4 g/dL — ABNORMAL LOW (ref 3.4–4.8)
ALKALINE PHOSPHATASE: 49 U/L (ref <150)
ALT (SGPT): 8 U/L (ref <55)
AST (SGOT): 17 U/L (ref 8–48)
BILIRUBIN, TOTAL: 2.3 mg/dL — ABNORMAL HIGH (ref 0.3–1.3)
BILIRUBIN, TOTAL: 2.3 mg/dL — ABNORMAL HIGH (ref 0.3–1.3)
BILIRUBIN,CONJUGATED: 0.7 mg/dL — ABNORMAL HIGH (ref <0.3)
TOTAL PROTEIN: 5.3 g/dL — ABNORMAL LOW (ref 6.0–8.0)

## 2014-07-12 LAB — PRODUCT: FRESH FROZEN PLASMA - BMC/JMC ONLY

## 2014-07-12 LAB — PT/INR: INR: 1.42 — ABNORMAL HIGH (ref 0.80–1.20)

## 2014-07-12 LAB — MAGNESIUM
MAGNESIUM: 2 mg/dL (ref 1.6–2.5)
MAGNESIUM: 2 mg/dL (ref 1.6–2.5)

## 2014-07-12 LAB — HEP-2 SUBSTRATE ANTINUCLEAR ANTIBODIES (ANA), SERUM: ANTI-NUCLEAR AB: NEGATIVE

## 2014-07-12 LAB — LIVER/KIDNEY MICROSOME (LKM) TYPE 1 ANTIBODIES, SERUM: LIVER/KIDNEY MICROSOME (LKM) TYPE 1 ANTIBODIES, SERUM: <5 U

## 2014-07-12 LAB — PERFORM POC WHOLE BLOOD GLUCOSE
GLUCOSE, POINT OF CARE: 111 mg/dL — ABNORMAL HIGH (ref 70–105)
GLUCOSE, POINT OF CARE: 128 mg/dL — ABNORMAL HIGH (ref 70–105)
GLUCOSE, POINT OF CARE: 99 mg/dL (ref 70–105)

## 2014-07-12 LAB — TYPE AND SCREEN - BMC/JMC ONLY
ABO/RH(D): B POS
ANTIBODY SCREEN: NEGATIVE

## 2014-07-12 LAB — HISTORICAL SURGICAL PATHOLOGY SPECIMEN

## 2014-07-12 LAB — PHOSPHORUS: PHOSPHORUS: 2.7 mg/dL (ref 2.3–4.0)

## 2014-07-12 MED ORDER — GENTAMICIN 40 MG/ML INJECTION SOLUTION
2.0000 mg/kg | Freq: Once | INTRAMUSCULAR | Status: AC
Start: 2014-07-13 — End: 2014-07-13
  Administered 2014-07-13: 180 mg via INTRAVENOUS
  Filled 2014-07-12: qty 5
  Filled 2014-07-12: qty 4.5

## 2014-07-12 MED ORDER — GENTAMICIN IV - PHARMACIST TO DOSE PER PROTOCOL
Freq: Every day | Status: DC | PRN
Start: 2014-07-12 — End: 2014-07-13

## 2014-07-12 MED ORDER — SODIUM CHLORIDE 0.9 % INTRAVENOUS SOLUTION
25.0000 mg/kg | Freq: Once | INTRAVENOUS | Status: AC
Start: 2014-07-13 — End: 2014-07-13
  Administered 2014-07-13: 2750 mg via INTRAVENOUS
  Filled 2014-07-12 (×2): qty 27.5

## 2014-07-12 MED ORDER — METRONIDAZOLE 750 MG IN NS 150 ML IVPB
750.0000 mg | INJECTION | Freq: Once | Status: AC
Start: 2014-07-13 — End: 2014-07-13
  Administered 2014-07-13: 08:00:00 750 mg via INTRAVENOUS
  Filled 2014-07-12 (×3): qty 150

## 2014-07-12 MED ORDER — ACETAMINOPHEN 325 MG TABLET
650.00 mg | ORAL_TABLET | ORAL | Status: DC | PRN
Start: 2014-07-12 — End: 2014-07-13
  Administered 2014-07-12: 650 mg via ORAL
  Filled 2014-07-12: qty 2

## 2014-07-12 MED ADMIN — ROPIVACAINE NERVE PLEXUS INFUSION: INTRAVENOUS | @ 23:00:00 | NDC 63323028630

## 2014-07-12 MED ADMIN — THROMBIN 5,000 UNITS IN NS: @ 22:00:00 | NDC 28400010541

## 2014-07-12 MED ADMIN — bacitracin zinc 500 unit/gram topical ointment: ORAL | @ 05:00:00

## 2014-07-12 NOTE — Care Plan (Signed)
Problem: General Plan of Care(Adult,OB)  Goal: Plan of Care Review(Adult,OB)  The patient and/or their representative will communicate an understanding of their plan of care   Outcome: Ongoing (see interventions/notes)  Safe for supervised mobility.  Anticipate that patient will be able to go directly home when medically ready for discharge, provided upcoming surgery goes well.    Rehab Potential: Good    Problem List: Decreased Ambulation and Decreased Transfers  Barriers to Discharge: currently only mobility barrier to discharge is need to try steps.  General mobility will likely be impaired initially after surgery.  Goals:    Patient will transfer independently by D/C to home.  Patient weill ambulate 250 ft independently by D/C to home.  Patient will ambulate up/down 1 flight steps with supervision by D/C to home.  Discharge Needs:    Equipment Recommendations: None Anticipated  Further Treatment Recommendations: likely home. Will re-evaluate after upcoming surgery.

## 2014-07-12 NOTE — Anesthesia Preprocedure Evaluation (Addendum)
Physical Exam:     Airway       Mallampati: IV    TM distance: <3 FB    Neck ROM: full  Mouth Opening: fair.  No Facial hair  No Beard  No endotracheal tube present  No Tracheostomy present    Dental       Dentition intact             Pulmonary    Breath sounds clear to auscultation       Cardiovascular    Rhythm: regular  Rate: Normal       Other findings            Anesthesia Plan:  Planned anesthesia type: general  ASA 4     Intravenous induction   Patient's NPO status is appropriate for Anesthesia.    Anesthetic plan and risks discussed with patient.    Anesthesia issues/risks discussed are: Dental Injuries, PONV, Stroke, Blood Loss, Intraoperative Awareness/ Recall, Post-op Intubation/Ventilation and Cardiac Events/MI.    Use of blood products discussed with patient whom.     Plan discussed with CRNA.                    Pleasant 59y M scheduled for R hemicolectomy on 6/3. He has marfan's disease and history of aortic valve replacement 17y ago. Has developed atrial fibrillation since then and takes coumadin. Denies any physical limitations, chest pain, or shortness of breath. Has diabetes type II which is diet controlled, and hypothyroidism. Also HTN and takes coreg, aldactone, lasix.   EKG on 5/31 showed Afib RVR  Labs: Hgb 8.4, K 3.6, Cr 0.85    Patient is a narrow palate and crowded dentition. His mouth opening is poor and has <3 fingerbreadth TM distance.     He has a PICC line on the left.    NPO 8h before procedure. Continue beta blocker, aldactone, and lasix. HR control. Type and cross.    Tad Moore, MD  07/12/2014, 11:17  NPO today

## 2014-07-12 NOTE — OT Evaluation (Signed)
[place]  Occupational Therapy Initial Evaluation    Patient Name: Leory Allinson Skowronek  Date of Birth: 1951-02-02  Height: Height: 193 cm (6' 3.98")    Weight: Weight: (!) 139.3 kg (307 lb 1.6 oz)      HPI:     Terryn Redner is a  64 y.o. male PMH of Marfan's syndrome with 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    Syncope     Hemorrhagic shock     Acute on chronic renal failure     Muscle weakness      Generalized    Hypertension 07/10/2014         Past Surgical History   Procedure Laterality Date    Hx heart valve surgery  1999     st judes     Hx upper endoscopy  07/09/14    Hx tonsil and adenoidectomy           History     Social History Narrative       SUBJECTIVE INFORMATION:  "I am doing pretty well"  "I just can't put pants on because of my poop tube"    FUNCTIONAL EVALUATION SUMMARY:     07/12/14 1409   Therapist Pager   OT Assigned/ Pager # Yogesh Cominsky [phone]   General Information   General Observations: RN approved session. Spouse present.   Precautions/Limitations full code   Pre Treatment Status   Pre Treatment Patient Status Patient sitting in bedside chair or w/c   Mutuality/Individual Preferences   Patient Specific Preferences Call me Kaveh   Patient Specific Goals To return home at discharge   Patient Specific Interventions Amb in hallway with supervision     What Anxieties, Fears or Concerns Do You Have About Your Health or Care? None   What Questions Do You Have About Your Health or Care? None   What Information Would Help [place] Give You More Personalized Care? "I can't put pants on because I still have a tube up my ass"   Mutuality Comment Agreeable and pleasant to OT evaluation   Living Environment   Lives With spouse   Living Arrangements house   Home Assessment: Stairs in [place] Accessibility stairs to enter home;stairs within home   Number of Stairs to [place] 1   Number of [place] reports home has 3 flights of stairs. Walk in shower. 1 STE   Functional Level Prior   Ambulation 0-->independent   Transferring 0-->independent   Toileting 0-->independent   Bathing 0-->independent   Dressing 0-->independent   Eating 0-->independent   Communication 0-->understands/communicates without difficulty   Self-Care  Equipment Currently Used at Home none   Pain Assessment   Pain Comment (Pre/Post Treatment Pain) c/o burning/shocking pain above R pec upon standing   Coping/Psychosocial Response   Observed Emotional State accepting;calm;cooperative;pleasant   Coping/Psychosocial Response Interventions   Plan Of Care Reviewed With patient;spouse   Cognitive Status Examination   Orientation orientation to person, place and time   Range Of Motion (ROM)   Range of Motion Examination no ROM deficits were identified   Manual Muscle Testing (MMT)   Manual Muscle Testing Results no strength deficits were identified   Mobility   Mobility Comments (S) STS from low chair. Amb ~200 ft with supervision and partially holding onto IV pole. Good balance without IV pole. (I) stand to sit in chair.   BADL Evaluation   BADL Comments Doffed and donned socks. Declined grooming tasks at this time. Anticipate no difficulties with other ADL tasks.   Balance   Additional Documentation Balance Skills Assessment   Endurance   Endurance good      Balance Skills Assessment   Sitting Balance: Static good balance   Sitting Balance: Dynamic good balance   Sit-to-Stand Balance good balance   Standing Balance: Static good balance   Standing Balance: Dynamic good balance   Post Treatment Status   Post Treatment Patient Status Patient sitting in bedside chair or w/c   Clinical Impression   Functional Level at Time of Session OOB with supervision   Patient/Family Goals Statement Home   Criteria for Skilled Therapeutic Interventions Met yes   Rehab Potential good, to achieve stated therapy goals   Therapy Frequency minimum of 2x/week;1x/day   Anticipated Equipment Needs at Discharge none anticipated   Anticipated Discharge Disposition home with assist       Assessment:     Patient tolerated Occupational Therapy Evaluation well. Mr. Zalesky currently presents with generalized weakness and c/o pain in chest above R pec described as being "burnt my a cigarette" upon standing. Patient demo'd independence with LE dressing and amb in hallway with supervision, good balance. Prior to hospitalization, patient was independent. Occupational Therapy recommendation is home with assist when medically stable .       Goals:   By discharge, the patient will:  - Participate in functional transfers from various surfaces (chair/bed/toilet) with S.   - Tolerate at least 5 minutes of functional standing at sink while completing grooming ADLs  with S to increase endurance and independence  - Participate in functional mobility with S, 300 ft or more, supervision or better to increase  independence in ADL's.  - Tolerate at least 30 mins or more of endurance activity while participating in OT treatment  focusing on functional tasks/UE activities.   - Perform functional ADL's with A/E (if needed), (I) to promote independence and and improve  quality of life.  - Perform bed mobility (supine <>sit, sit<>stand, rolling, scooting) (I) to increase independence  at home or facility and decrease  caregiver burden.    Discharge Needs:   Equipment Recommendations: to be determined  Further Treatment Recommendations: Home    Plan:   Current Intervention: ADL retraining, functional mobility/transfer training, energy conservation/endurance training, upper extremity functional  activity, therapeutic exercise, home safety training, patient/family education and further goals assessed with patient progress  To provide Occupational Therapy services 1 time/day for at least 3 days/week until d/c or goals have been met and patient is deemed appropriate for discharge.     Occupational Therapist:   Sharlette Dense. Bertha, OTR/L #[id number]  Pager #: [phone]  Evaluation Time: 55 minutes

## 2014-07-12 NOTE — Nurses Notes (Signed)
Results for WILLET, SCHLEIFER (MRN [id number]) as of 07/12/2014 16:41   Ref. Range 07/12/2014 16:09   APTT Latest Ref Range: 25.1-36.5 Sec 64.3 (H)   PTT therapeutic at this time, no change to heparin gtt.

## 2014-07-12 NOTE — Consults (Addendum)
[facility]  Surgical Oncology Consult  Follow Up    Major, Santerre, 64 y.o. male  Date of Service: 07/12/2014  Date of Birth:  1950-05-28    Hospital Day:  LOS: 2 days     Subjective/Overnight Events:  Doing well, no acute overnight events    Objective:  Temperature: 36.7 C (98.1 F)  Heart Rate: (!) 102  BP (Non-Invasive): 126/87 mmHg  Respiratory Rate: 16  SpO2-1: 95 %  Pain Score (Numeric, Faces): 0    General:  NAD.  HENT:  NTAC.  Lungs: Breathing unlabored.  Cardiovascular:  RR.  Abdomen:  S, NT, ND.  Extremities:  No cyanosis.  Skin:  Skin warm and dry.  Neurologic:  AOx3.    Psychiatric:  Affect Normal.    Labs:    BMP:     Recent Labs      07/12/14   0459   SODIUM  136   POTASSIUM  3.6   CHLORIDE  108   CO2  22   BUN  8   CREATININE  0.85   GLUCOSENF  102   ANIONGAP  6   BUNCRRATIO  9   GFR  >59   CALCIUM  7.9*     CBC Results Differential Results   Recent Labs      07/12/14   0459   WBC  6.2   HGB  8.4*   HCT  25.7*   PLTCNT  122*    Recent Results (from the past 30 hour(s))   CBC/DIFF    Collection Time: 07/12/14  4:59 AM   Result Value    WBC 6.2    PMN'S 78    LYMPHOCYTES 14    MONOCYTES 7    EOSINOPHIL 1    BASOPHILS 0        Hepatic Function:    Recent Labs      07/12/14   0459   TOTALPROTEIN  5.3*   ALBUMIN  2.4*   TOTBILIRUBIN  2.3*   BILIRUBINCON  0.7*   AST  17   ALT  8   ALKPHOS  49     Magnesium:     Recent Labs      07/12/14   0459   MAGNESIUM  2.0     Phosphorus:     Recent Labs      07/12/14   0459   PHOSPHORUS  2.7     Coags:    Recent Labs      07/12/14   0459   PROTHROMTME  15.9*   INR  1.42*   APTT  54.0*       Imaging Studies:  07/11/2014 - CT A/P w/w/o contrast - [place]  Findings: There is small bilateral pleural effusions with associated atelectasis. There is the 4 mm nodule within the right lower lobe series 5 image 2. Followup with CT chest in 6-12 months is recommended. There is an additional nodule at approximately 2 mm on series 5 image 2 within the right  upper lobe inferiorly.    There is cholelithiasis and mild gallbladder wall thickening. There is no biliary ductal dilatation. No evidence for hepatic mass on this single phase study. No specific evidence for sclerotic or fibrotic change. There is moderate atrophic change of the pancreas. Spleen is normal size. There are small porta hepatis lymph nodes which are likely reactive. There is no hydronephrosis or hydroureter.    There is no evidence for bowel obstruction. No evidence for  significant bowel wall thickening in a patient with known mass within the proximal right colon. The appendix is unremarkable. There is diverticulosis in the sigmoid colon without evidence of complicated disease.    There is mild mesenteric stranding and a minimal amount of free fluid within the pelvis. There is a rectal tube in place. There is a left inguinal hernia containing only fat. There are scattered small mesenteric and retroperitoneal lymph nodes without significant lymphadenopathy by size criteria.    There are degenerative changes in the spine without evidence for acute bony abnormality.    IMPRESSION:   1.Small bilateral pleural effusions. 2 pulmonary nodules, largest measuring 4 mm. Followup with CT chest in 6-12 months is recommended.  2. Cholelithiasis with mild gallbladder wall thickening. If there is concern for acute cholecystitis further evaluation with ultrasound is recommended.  3. Patient's known cecal mass is not visualized. No evidence of obstruction. Diverticulosis without evidence for complicated disease.  4. Mild nonspecific mesenteric stranding and minimal free fluid within the pelvis.    Assessment/Recommendations:  Patient is a 64 y/o M, admitted 07/10/2014; HD  LOS: 2 days  with Hx of Marfans, mechanical aortic valve (1999) (on anticoaggulation), aFib (s/p cardioversion), DM, hypothyroidism, who presented to [facility] w/ bloody diarrhea, supratherapeutic INR and hypotension.  Admitted to  MICU, INR reversed, resuscitated and started on heparin drip.  GI consulted and colonoscopy remarkable for large mass along the 1st fold of the ascending colon concerning for malignancy.  Surg Onc consulted for evaluation.  He has underwent:    1.  07/10/2014 - Colonoscopy w/ Bx    ALL:  NKDA  Meds:  Coreg, Lasix, synthroid, Mg, K, spironolactone, coumadin    PMHx:  As above  PSHx:  As above + T&A    VSS, aFEB, URINE OUTPUT appropriate  -  No further episodes of hypotension or tachycardia  Labs reviewed  -  Hb stable @ 8  Glucose - under good control, no Hx of DM  1.  Lower GI bleed secondary to supratherapic INR aand ascending colon mass concerning for malignancy  -  Remains hemodynamically stable, Hb also remains stable  -  Plan for right hemicolectomy tomorrow (07/13/2014); informed of risks and benefits of the procedure, agreeable to proceed w/ surgical intervention, consent obtained and in chart  -  Hx of mechanical heart valve, seen by Cardiology, appreciate recs:  Will stop heparin drip at midnight and restart after surgery, continue beta blocker, hold spironolactone, echo 6/1 w/ EF 55 - 60%  -  ABX - REC covering for MRSA, discussed w/ Pharmacy, REC Vanc, flagyl and gent  -  On PPI drip  -  Underwent bowel prep w/ GoLytely prior to colonoscopy and has been on clears since that time, can probably hold on further bowel prep for now  -  NPO after midnight, labs in AM, type and cross 2Us  -  Albumin 2.4, CEA 4.5    Miscellaneous/Home Meds:  Coreg, synthroid; holding lasix, spironolacton and coumadin    ABX:  As above  Diet/fluids:  Diet as above  Activity:  As tolerated  Pain control:  Pending post op  GI/DVT prophylaxis:  As above  Flatus/last BM:  +/+  PT/OT ordered/dispo recs:  Pending post op  Lines:  07/11/2014 - PICC  Dispo:  Continue SDS status    Roby Lofts, MD 07/12/2014, 08:36    I saw and examined the patient.  I reviewed the resident's note.  I agree with  the findings and plan of care as documented in  the resident's note.  Any exceptions/additions are edited/noted.    For laparoscopic right hemicolectomy in am     Hold heparin gtts at MN    Type and cross 2 units    NPO after MN    Gwinda Passe, MD 07/12/2014, 16:35

## 2014-07-12 NOTE — Consults (Signed)
Path report adenoca of the ICV.  Appreciate Surg Onc recs/plan.  Will sign off, please re-consult if needed.

## 2014-07-12 NOTE — Care Plan (Signed)
Problem: General Plan of Care(Adult,OB)  Goal: Plan of Care Review(Adult,OB)  The patient and/or their representative will communicate an understanding of their plan of care   Low intensity heparin gtt intiated last night, pt therapeutic APTT this AM. Continues to be pleasant and denies having any. Will continue to monitor pt closely

## 2014-07-12 NOTE — PT Evaluation (Signed)
[facility]  Physical Therapy Initial Evaluation    Patient Name: Kenneth Weeks  Date of Birth: January 21, 1951  Height:  193 cm (6' 3.98")  Weight: (!) 139.3 kg (307 lb 1.6 oz)  Room/Bed: 8NE/05  Payor: MEDICARE / Plan: MEDICARE PART A AND B / Product Type: Medicare /      Date/Time of Admission: 07/10/2014  4:09 PM  Admitting Diagnosis:  GI bleed [K92.2]      HPI:     Kenneth Weeks is a 64 y.o. male admitted 07-10-14 with lower GI bleed. Now improving, but surgical repair planned.  Precautions: None     Past Medical History   Diagnosis Date    Marfan syndrome     Congestive heart failure     Diabetes mellitus      type ii diet controlled    Wears glasses     Thyroid disease      hypothyroid    Reflux     Hydrocele     Hemorrhage of gastrointestinal tract 5/16    A-fib      Chronic anti-coagulant therapy    Transfusion history      s/p Multiple units PRC/FFP this adm.    Syncope     Hemorrhagic shock     Acute on chronic renal failure     Muscle weakness      Generalized    Hypertension 07/10/2014         Past Surgical History   Procedure Laterality Date    Hx heart valve surgery  1999     st judes     Hx upper endoscopy  07/09/14    Hx tonsil and adenoidectomy            reports that he has never smoked. He has never used smokeless tobacco. He reports that he does not drink alcohol or use illicit drugs.  History   Smoking status    Never Smoker    Smokeless tobacco    Never Used         Subjective:   Alert, cooperative.  Wife present.  Patient reports he has been walking 'around the block' with nursing assist.         07/12/14 1640   Therapist Pager   PT Assigned/ Pager # [phone]   Mutuality/Individual Preferences   Patient Specific Preferences Call me Kenneth Weeks   Patient Specific Goals Go home   Patient Specific Interventions Amb, instruction in transfers into out of bed.   What Anxieties, Fears or Concerns Do You Have About Your Health or  Care? Anxious about wires and tubes   What Questions Do You Have About Your Health or Care? None stated   What Information Would Help [place] Give You More Personalized Care? None stated   Living Environment   Lives With spouse   Living Environment Comment 1 step at entry to home. 1 flight to bedroom   Functional Level Prior   Ambulation 0-->independent   Transferring 0-->independent   Plan of Care Review   Plan Of Care Reviewed With patient;spouse       Patient Goals: To get well and go home    Pain: Discomfort from [facility].  Intermittent neck pain.      Objective:     Cognition: Oriented Person, Place and Time  Follows Directions: Simple       ROM: Grossly WFL    Strength: Grossly WFL      -Bed Mobility:  Rolls independently.  Able to shift hips laterally in bed without assist.    -Transfers: Instructed patient in supine to sidelying to sit and sit to sidelying to supine, (in preparation for abdominal incision)which he did with HOB flat, without assist.  Sit to stand and stand to sit with supervision.      -Balance: Sitting:  good                    Standing: fair                   Amb: fair. No loss of balance.    -Ambulation: 200 ft with supervision.  Slow pace, no loss of balance.       Left in bed with call button within reach and needs met.  Advised patient to get up with assist only for now, for safety.      Assessment:   Safe for supervised mobility.  Anticipate that patient will be able to go directly home when medically ready for discharge, provided upcoming surgery goes well.    Rehab Potential: Good    Problem List: Decreased Ambulation and Decreased Transfers  Barriers to Discharge: currently only mobility barrier to discharge is need to try steps.  General mobility will likely be impaired initially after surgery.      Goals:   Patient will transfer independently by D/C to home.  Patient weill ambulate 250 ft independently by D/C to home.  Patient will ambulate up/down 1 flight steps with supervision by  D/C to home.      Discharge Needs:   Equipment Recommendations: None Anticipated  Further Treatment Recommendations: likely home. Will re-evaluate after upcoming surgery.      Plan:   Current Intervention:Therapeutic Exercise and Mobility Training   To provide physical therapy services  1 times/day for at least 3 days/week until discontinued.    The risks/benefits of therapy have been discussed with the patient/caregiver and he/she is in agreement with the established plan of care.     Therapist:   Iona Beard, PT  Pager [phone]  Evaluation Time: 42 minutes

## 2014-07-12 NOTE — Care Plan (Signed)
Problem: General Plan of Care(Adult,OB)  Goal: Plan of Care Review(Adult,OB)  The patient and/or their representative will communicate an understanding of their plan of care      Assessment:     Patient tolerated Occupational Therapy Evaluation well. Kenneth Weeks currently presents with generalized weakness and c/o pain in chest above R pec described as being "burnt my a cigarette" upon standing. Patient demo'd independence with LE dressing and amb in hallway with supervision, good balance. Prior to hospitalization, patient was independent. Occupational Therapy recommendation is home with assist when medically stable .         Goals:   By discharge, the patient will:  - Participate in functional transfers from various surfaces (chair/bed/toilet) with S.   - Tolerate at least 5 minutes of functional standing at sink while completing grooming ADLs  with S to increase endurance and independence  - Participate in functional mobility with S, 300 ft or more, supervision or better to increase  independence in ADL's.  - Tolerate at least 30 mins or more of endurance activity while participating in OT treatment  focusing on functional tasks/UE activities.   - Perform functional ADL's with A/E (if needed), (I) to promote independence and and improve  quality of life.  - Perform bed mobility (supine <>sit, sit<>stand, rolling, scooting) (I) to increase independence  at home or facility and decrease caregiver burden.      Discharge Needs:   Equipment Recommendations: to be determined  Further Treatment Recommendations: Home      Plan:   Current Intervention: ADL retraining, functional mobility/transfer training, energy conservation/endurance training, upper extremity functional  activity, therapeutic exercise, home safety training, patient/family education and further goals assessed with patient progress  To provide Occupational Therapy services 1 time/day for at least 3 days/week until d/c or goals have been met and patient is  deemed appropriate for discharge.   Occupational Therapist:   Macarthur Critchley. Marnette Burgess, OTR/L #[id number]  Pager #: [phone]

## 2014-07-12 NOTE — Progress Notes (Addendum)
[facility]  Medicine Progress Note  Full Code  Kenneth Weeks  Date of service: 07/12/2014    Subjective: feeling good this morning. No abdominal pain. Still with flexiseal in place. No complaints. Anxious about procedure.     Vital Signs:  Temp (24hrs) Max:37.1 C (01.7 F)      Systolic (51WCH), ENI:778 mmHg, Min:102 mmHg, EUM:353 mmHg    Diastolic (61WER), XVQ:00 mmHg, Min:54 mmHg, Max:87 mmHg    Temp  Avg: 36.7 C (98 F)  Min: 36 C (96.8 F)  Max: 37.1 C (98.8 F)  Pulse  Avg: 86.4  Min: 70  Max: 102  Resp  Avg: 12.7  Min: 0  Max: 23  SpO2  Avg: 96.2 %  Min: 92 %  Max: 99 %  MAP (Non-Invasive)  Avg: 82.9 mmHG  Min: 64 mmHG  Max: 97 mmHG  Pain Score (Numeric, Faces): 2  Fi02    I/O:  I/O last 24 hours:    Intake/Output Summary (Last 24 hours) at 07/12/14 1731  Last data filed at 07/12/14 1706   Gross per 24 hour   Intake    840 ml   Output   1000 ml   Net   -160 ml     I/O current shift:  06/02 1600 - 06/02 2359  In: 15 [I.V.:15]  Out: 300 [Urine:300]  Blood Sugars:   Last Fingerstick:    Lab Results   Component Value Date    GLUCOSEPOC 114* 07/12/2014         Current Facility-Administered Medications:  acetaminophen (TYLENOL) tablet 650 mg Oral Q4H PRN   carvedilol (COREG) tablet 12.5 mg Oral 2x/day-Food   Gentamicin - Pharmacist to Dose per Protocol  Does not apply Daily PRN   heparin 25,000 units in D5W 250 mL infusion 1,500 Units/hr Intravenous Continuous   heparin flush (HEPFLUSH) 10 units/mL injection 2-6 mL Intracatheter Q8HRS   heparin flush (HEPFLUSH) 10 units/mL injection 2 mL Intracatheter Q1 MIN PRN   levothyroxine (SYNTHROID) tablet 200 mcg Oral QAM   NS flush syringe 10-30 mL Intracatheter Q8HRS   NS flush syringe 20-30 mL Intracatheter Q1 MIN PRN   pantoprazole (PROTONIX) 80 mg in NS 100 mL infusion  Intravenous Q12H   And      [START ON 07/13/2014] pantoprazole (PROTONIX) delayed release tablet 40 mg Oral Q24H   SSIP insulin lispro (HUMALOG) 100 units/mL injection 2-6 Units  Subcutaneous 4x/day PRN       No Known Allergies    Physical Exam:  General: appears in good health  Eyes: Conjunctiva clear., Pupils equal and round. , Sclera non-icteric.   HENT:Head atraumatic and normocephalic, ENT without erythema or injection, mucous membranes moist.  Neck: supple, symmetrical, trachea midline  Lungs: Clear to auscultation bilaterally.   Cardiovascular: regular rate and rhythm, S1, S2 normal, no murmur, click, rub or gallop  Abdomen: Soft, non-tender, Bowel sounds normal, non-distended  Extremities: No cyanosis or edema  Skin: Skin warm and dry and No rashes  Neurologic: Grossly normal  Psychiatric: Normal affect, behavior, memory, thought content, judgement, and speech.    Labs:  Lab Results for Last 24 Hours:    Results for orders placed or performed during the hospital encounter of 07/10/14 (from the past 24 hour(s))   POCT WHOLE BLOOD GLUCOSE   Result Value Ref Range    GLUCOSE, POINT OF CARE 102 70 - 105 mg/dL   PTT (PARTIAL THROMBOPLASTIN TIME)   Result Value Ref Range  APTT 32.4 25.1 - 36.5 Sec   CBC   Result Value Ref Range    WBC 6.8 3.5 - 11.0 THOU/uL    RBC 3.24 (L) 4.06 - 5.63 MIL/uL    HGB 8.6 (L) 12.5 - 16.3 g/dL    HCT 26.2 (L) 36.7 - 47.0 %    MCV 81.0 78 - 100 fL    MCH 26.6 (L) 27.4 - 33.0 pg    MCHC 32.9 32.5 - 35.8 g/dL    RDW 18.5 (H) 12.0 - 15.0 %    PLATELET COUNT 126 (L) 140 - 450 THOU/uL    MPV 8.3 7.5 - 11.5 fL   CBC/DIFF   Result Value Ref Range    WBC 6.2 3.5 - 11.0 THOU/uL    RBC 3.16 (L) 4.06 - 5.63 MIL/uL    HGB 8.4 (L) 12.5 - 16.3 g/dL    HCT 25.7 (L) 36.7 - 47.0 %    MCV 81.2 78 - 100 fL    MCH 26.5 (L) 27.4 - 33.0 pg    MCHC 32.7 32.5 - 35.8 g/dL    RDW 18.8 (H) 12.0 - 15.0 %    PLATELET COUNT 122 (L) 140 - 450 THOU/uL    MPV 8.4 7.5 - 11.5 fL    PMN'S 78 %    PMN ABS 4.846 1.500 - 7.700 THOU/uL    LYMPHOCYTES 14 %    LYMPHS ABS 0.845 (L) 1.000 - 4.800 THOU/uL    MONOCYTES 7 %    MONOS ABS 0.457 0.300 - 1.000 THOU/uL    EOSINOPHIL 1 %    EOS ABS 0.065 0.000  - 0.500 THOU/uL    BASOPHILS 0 %    BASOS ABS 0.022 0.000 - 0.200 THOU/uL   BASIC METABOLIC PANEL, NON-FASTING   Result Value Ref Range    SODIUM 136 136 - 145 mmol/L    POTASSIUM 3.6 3.5 - 5.1 mmol/L    CHLORIDE 108 96 - 111 mmol/L    CARBON DIOXIDE 22 22 - 32 mmol/L    ANION GAP 6 4 - 13 mmol/L    CREATININE 0.85 0.62 - 1.27 mg/dL    ESTIMATED GLOMERULAR FILTRATION RATE >59 >59 ml/min/1.52m2    GLUCOSE,NONFAST 102 65 - 139 mg/dL    BUN 8 8 - 25 mg/dL    BUN/CREAT RATIO 9 6 - 22    CALCIUM 7.9 (L) 8.5 - 10.4 mg/dL   MAGNESIUM   Result Value Ref Range    MAGNESIUM 2.0 1.6 - 2.5 mg/dL   PHOSPHORUS   Result Value Ref Range    PHOSPHORUS 2.7 2.3 - 4.0 mg/dL   HEPATIC FUNCTION PANEL   Result Value Ref Range    ALBUMIN 2.4 (L) 3.4 - 4.8 g/dL    BILIRUBIN, TOTAL 2.3 (H) 0.3 - 1.3 mg/dL    ALKALINE PHOSPHATASE 49 <150 U/L    AST (SGOT) 17 8 - 48 U/L    ALT (SGPT) 8 <55 U/L    BILIRUBIN,CONJUGATED 0.7 (H) <0.3 mg/dL    TOTAL PROTEIN 5.3 (L) 6.0 - 8.0 g/dL   PT/INR   Result Value Ref Range    PROTHROMBIN TIME 15.9 (H) 9.0 - 13.4 Sec    INR 1.42 (H) 0.80 - 1.20   PTT (PARTIAL THROMBOPLASTIN TIME)   Result Value Ref Range    APTT 54.0 (H) 25.1 - 36.5 Sec   POCT WHOLE BLOOD GLUCOSE   Result Value Ref Range    GLUCOSE, POINT OF CARE 111 (H)  70 - 105 mg/dL   H & H   Result Value Ref Range    HGB 8.2 (L) 12.5 - 16.3 g/dL    HCT 24.8 (L) 36.7 - 47.0 %   PTT (PARTIAL THROMBOPLASTIN TIME)   Result Value Ref Range    APTT 58.7 (H) 25.1 - 36.5 Sec   POCT WHOLE BLOOD GLUCOSE   Result Value Ref Range    GLUCOSE, POINT OF CARE 128 (H) 70 - 105 mg/dL   H & H   Result Value Ref Range    HGB 8.8 (L) 12.5 - 16.3 g/dL    HCT 26.8 (L) 36.7 - 47.0 %   PTT (PARTIAL THROMBOPLASTIN TIME)   Result Value Ref Range    APTT 64.3 (H) 25.1 - 36.5 Sec   POCT WHOLE BLOOD GLUCOSE   Result Value Ref Range    GLUCOSE, POINT OF CARE 114 (H) 70 - 105 mg/dL       Radiology:   CXR- Mild cardiomegaly without evidence of acute cardiopulmonary process.  CT  abdomen/pelvis- 1.Small bilateral pleural effusions. 2 pulmonary nodules, largest measuring 4 mm. Followup with CT chest in 6-12 months is recommended.  2. Cholelithiasis with mild gallbladder wall thickening. If there is concern for acute cholecystitis further evaluation with ultrasound is recommended.  3. Patient's known cecal mass is not visualized. No evidence of obstruction. Diverticulosis without evidence for complicated disease.  4. Mild nonspecific mesenteric stranding and minimal free fluid within the pelvis.    Microbiology:   Blood culture- NGTD x 2 days  Cryptosporidium- negative  Cdiff- negative  Giardia-negative  Stool culture-normal    PT/OT: Yes      Assessment/ Plan:   Active Hospital Problems    Diagnosis    Primary Problem: GI bleed    Thrombocytopenia, Unspecified    Marfan syndrome    Hypertension    Congestive heart failure    History of mechanical aortic valve replacement    Hypothyroidism    Chronic atrial fibrillation     Acute Lower GI Bleed, concern for malignancy   - VS, H/H now stable (s/p 8U pRBCs, 1 FFP and 1 plt ). Continue to trend q6h H/H.  - GI consulted, signed off. EGD negative. S/p colonoscopy w/ bx 07/10/14 of large colonic mass, pathology pending.  - surg onc consulted, appreciate recs- plan for resection tomorrow  - CEA negative  - continue Protonix infusion    Marfan's Syndrome s/p Mechanical Aortic Valve Replacement 1999  - INR now stable (supratherapeutic to 10.27 @ outside facility), continue to monitor  - cardiology consulted, appreciate recs. Started low dose heparin infusion yesterday for anticoagulation.   -home Coumadin dosing was 7.5 mg Monday-Friday and 10 mg on Saturday and Sunday  - will need reversal of INR if active bleeding were to occur again    Afib  - continue Coreg 12.5 mg for rate control    HTN  - currently holding lasix 40 mg daily and spironolactone 25 mg daily  - TTE w/ EF 55-60%    Hypothyroidism  - continue home Synthroid 200 mcg  daily    Cirrhotic Liver, etiology unknown  - new dx based on CT scan at outside facility. Liver enzymes have remained normal. Mildly elevated bilirubin. Consider NASH.    -Work up not consistent with viral hepatitis, [place] (normal ceruloplasmin), hemochromotosis (normal ferritin), A1AT def (normal).  - ANA positive; anti-smooth muscle ab and LKM (auto immune hepatitis) and AMA (PBC) pending  - GI  signed off    DVT/PE Prophylaxis: Heparin    Disposition Planning: Home discharge      Tamala Julian, MD 07/12/2014, 17:31      Late entry for 07/12/14. I saw and examined the patient.  I reviewed the resident's note.  I agree with the findings and plan of care as documented in the resident's note.  Any exceptions/additions are edited/noted.    Sterling Big, MD 07/13/2014, 11:39

## 2014-07-13 ENCOUNTER — Encounter (HOSPITAL_COMMUNITY)
Admission: EM | Disposition: A | Discharge status: Home Health | Payer: Self-pay | Source: Other Acute Inpatient Hospital | Attending: Surgery

## 2014-07-13 ENCOUNTER — Inpatient Hospital Stay (HOSPITAL_COMMUNITY): Payer: Medicare Other | Admitting: Anesthesiology

## 2014-07-13 DIAGNOSIS — K7460 Unspecified cirrhosis of liver: Secondary | ICD-10-CM | POA: Diagnosis present

## 2014-07-13 DIAGNOSIS — R600 Localized edema: Secondary | ICD-10-CM | POA: Diagnosis present

## 2014-07-13 DIAGNOSIS — K922 Gastrointestinal hemorrhage, unspecified: Secondary | ICD-10-CM

## 2014-07-13 DIAGNOSIS — C189 Malignant neoplasm of colon, unspecified: Secondary | ICD-10-CM

## 2014-07-13 DIAGNOSIS — N5089 Other specified disorders of the male genital organs: Secondary | ICD-10-CM | POA: Diagnosis present

## 2014-07-13 DIAGNOSIS — Q8740 Marfan's syndrome, unspecified: Secondary | ICD-10-CM

## 2014-07-13 HISTORY — PX: HX HEMICOLECTOMY: 2100001145

## 2014-07-13 LAB — ARTERIAL BLOOD GAS/LACTATE/CO-OX/LYTES (NA/K/CA/CL/GLUC) (TEMP COMP)
CHLORIDE: 107 mmol/L (ref 96–111)
CO2T: 35 MM HG — ABNORMAL LOW (ref 36.2–46.2)
GLUCOSE: 143 mg/dL — ABNORMAL HIGH (ref 70–105)
GLUCOSE: 143 mg/dL — ABNORMAL HIGH (ref 70–105)
HEMOGLOBIN: 8.9 g/dL — ABNORMAL LOW (ref 14.0–18.0)
LACTATE: 0.7 mmol/L (ref <1.3)
O2CT: 12.2 (ref 17.6–24.3)
OXYHEMOGLOBIN: 96 % (ref 85.0–98.0)
PH: 7.39 pH (ref 7.350–7.450)
PHT: 7.4 (ref 7.350–7.450)
PIO2/FIO2 RATIO: 183 — ABNORMAL LOW (ref >300)
TEMPERATURE, COMP: 36 C (ref 15.0–40.0)
WHOLE BLOOD K+: 3.9 mmol/L (ref 3.5–5.0)

## 2014-07-13 LAB — PERFORM POC WHOLE BLOOD GLUCOSE
GLUCOSE, POINT OF CARE: 111 mg/dL — ABNORMAL HIGH (ref 70–105)
GLUCOSE, POINT OF CARE: 135 mg/dL — ABNORMAL HIGH (ref 70–105)
GLUCOSE, POINT OF CARE: 157 mg/dL — ABNORMAL HIGH (ref 70–105)

## 2014-07-13 LAB — ARTERIAL BLOOD GAS/LACTATE/CO-OX/LYTES (NA/K/CA/CL/GLUC) (TEMP COMP) - ORS ONLY
%FIO2: 58 % (ref 21–100)
BASE DEFICIT: 2.3 mmol/L (ref 0.0–3.0)
BICARBONATE: 23.1 mmol/L (ref 20.0–29.0)
CARBOXYHEMOGLOBIN: 1.8 % (ref 0.0–2.5)
IONIZED CALCIUM: 1.14 mmol/L — ABNORMAL LOW (ref 1.30–1.46)
MET-HEMOGLOBIN: 1.2 % (ref 0.0–3.0)
O2CT: 12.2 — ABNORMAL LOW (ref 17.6–24.3)
PCO2: 37 mmHg (ref 36.2–46.2)
PO2: 106 mmHg (ref 72–100)
PO2T: 100 mmHg (ref 72–100)
SODIUM: 133 mmol/L — ABNORMAL LOW (ref 136–145)

## 2014-07-13 LAB — SMOOTH MUSCLE ANTIBODIES, SERUM: ANTI-SMOOTH MUSCLE AB: NEGATIVE

## 2014-07-13 SURGERY — HEMICOLECTOMY RIGHT LAPAROSCOPIC
Anesthesia: General | Laterality: Right | Wound class: Clean Contaminated Wounds-The respiratory, GI, Genital, or urinary

## 2014-07-13 MED ORDER — FENTANYL (PF) 50 MCG/ML INJECTION SOLUTION
Freq: Once | INTRAMUSCULAR | Status: DC | PRN
Start: 2014-07-13 — End: 2014-07-13
  Administered 2014-07-13: 100 ug via INTRAVENOUS
  Administered 2014-07-13: 150 ug via INTRAVENOUS

## 2014-07-13 MED ORDER — SODIUM CHLORIDE 0.9 % INTRAVENOUS SOLUTION
INTRAVENOUS | Status: DC | PRN
Start: 2014-07-13 — End: 2014-07-13

## 2014-07-13 MED ORDER — MIDAZOLAM 1 MG/ML INJECTION SOLUTION
Freq: Once | INTRAMUSCULAR | Status: DC | PRN
Start: 2014-07-13 — End: 2014-07-13
  Administered 2014-07-13: 2 mg via INTRAVENOUS

## 2014-07-13 MED ORDER — HYDROMORPHONE 1 MG/ML INJECTION WRAPPER
0.40 mg | INJECTION | INTRAMUSCULAR | Status: DC | PRN
Start: 2014-07-13 — End: 2014-07-13

## 2014-07-13 MED ORDER — LACTATED RINGERS INTRAVENOUS SOLUTION
INTRAVENOUS | Status: DC | PRN
Start: 2014-07-13 — End: 2014-07-13

## 2014-07-13 MED ORDER — NEOSTIGMINE METHYLSULFATE 1 MG/ML INTRAVENOUS SOLUTION
Freq: Once | INTRAVENOUS | Status: DC | PRN
Start: 2014-07-13 — End: 2014-07-13

## 2014-07-13 MED ORDER — LACTATED RINGERS INTRAVENOUS SOLUTION
INTRAVENOUS | Status: DC
Start: 2014-07-13 — End: 2014-07-15

## 2014-07-13 MED ORDER — GENTAMICIN 40 MG/ML INJECTION SOLUTION
2.00 mg/kg | Freq: Once | INTRAMUSCULAR | Status: DC
Start: 2014-07-13 — End: 2014-07-13

## 2014-07-13 MED ORDER — PROPOFOL 10 MG/ML IV BOLUS
INJECTION | Freq: Once | INTRAVENOUS | Status: DC | PRN
Start: 2014-07-13 — End: 2014-07-13
  Administered 2014-07-13: 200 mg via INTRAVENOUS

## 2014-07-13 MED ORDER — METRONIDAZOLE 750 MG IN NS 150 ML IVPB
750.0000 mg | INJECTION | Freq: Once | Status: AC
Start: 2014-07-13 — End: 2014-07-13
  Administered 2014-07-13: 20:00:00 750 mg via INTRAVENOUS
  Filled 2014-07-13: qty 150

## 2014-07-13 MED ORDER — ROCURONIUM 10 MG/ML INTRAVENOUS SOLUTION
Freq: Once | INTRAVENOUS | Status: DC | PRN
Start: 2014-07-13 — End: 2014-07-13
  Administered 2014-07-13: 20 mg via INTRAVENOUS
  Administered 2014-07-13: 50 mg via INTRAVENOUS
  Administered 2014-07-13: 30 mg via INTRAVENOUS

## 2014-07-13 MED ORDER — VANCOMYCIN 10 GRAM INTRAVENOUS SOLUTION
18.0000 mg/kg | Freq: Once | INTRAVENOUS | Status: AC
Start: 2014-07-14 — End: 2014-07-14
  Administered 2014-07-14: 2000 mg via INTRAVENOUS
  Filled 2014-07-13: qty 20

## 2014-07-13 MED ORDER — PROCHLORPERAZINE EDISYLATE 10 MG/2 ML (5 MG/ML) INJECTION SOLUTION
5.00 mg | Freq: Once | INTRAMUSCULAR | Status: DC | PRN
Start: 2014-07-13 — End: 2014-07-13

## 2014-07-13 MED ORDER — METRONIDAZOLE 750 MG IN NS 150 ML IVPB
750.00 mg | INJECTION | Freq: Once | Status: DC
Start: 2014-07-13 — End: 2014-07-13

## 2014-07-13 MED ORDER — SODIUM CHLORIDE 0.9 % INTRAVENOUS SOLUTION
2.0000 mg/kg | Freq: Once | INTRAVENOUS | Status: AC
Start: 2014-07-13 — End: 2014-07-13
  Administered 2014-07-13: 180 mg via INTRAVENOUS
  Filled 2014-07-13: qty 4.5

## 2014-07-13 MED ORDER — GLYCOPYRROLATE 0.2 MG/ML INJECTION SOLUTION
Freq: Once | INTRAMUSCULAR | Status: DC | PRN
Start: 2014-07-13 — End: 2014-07-13
  Administered 2014-07-13: 0.4 mg via INTRAVENOUS

## 2014-07-13 MED ORDER — HYDROMORPHONE 1 MG/ML INJECTION WRAPPER
INJECTION | Freq: Once | INTRAMUSCULAR | Status: DC | PRN
Start: 2014-07-13 — End: 2014-07-13
  Administered 2014-07-13 (×2): 1 mg via INTRAVENOUS
  Administered 2014-07-13: 0.5 mg via INTRAVENOUS

## 2014-07-13 MED ORDER — HYDROMORPHONE 1 MG/ML INJECTION WRAPPER
0.4000 mg | INJECTION | INTRAMUSCULAR | Status: DC | PRN
Start: 2014-07-13 — End: 2014-07-17
  Administered 2014-07-13 – 2014-07-14 (×7): 0.4 mg via INTRAVENOUS
  Filled 2014-07-13 (×7): qty 1

## 2014-07-13 MED ORDER — ONDANSETRON HCL (PF) 4 MG/2 ML INJECTION SOLUTION
Freq: Once | INTRAMUSCULAR | Status: DC | PRN
Start: 2014-07-13 — End: 2014-07-13
  Administered 2014-07-13: 4 mg via INTRAVENOUS

## 2014-07-13 MED ORDER — LIDOCAINE (PF) 100 MG/5 ML (2 %) INTRAVENOUS SYRINGE
INJECTION | Freq: Once | INTRAVENOUS | Status: DC | PRN
Start: 2014-07-13 — End: 2014-07-13
  Administered 2014-07-13: 60 mg via INTRAVENOUS

## 2014-07-13 MED ADMIN — sodium chloride 0.9 % (flush) injection syringe: INTRAVENOUS | @ 07:00:00

## 2014-07-13 MED ADMIN — sodium chloride 0.9 % intravenous solution: @ 05:00:00 | NDC 00338004904

## 2014-07-13 MED ADMIN — SODIUM BICARB IN D5W INFUSION: @ 07:00:00 | NDC 00264751000

## 2014-07-13 MED ADMIN — ALBUTEROL 5 MG INHALATION: INTRAVENOUS | @ 05:00:00

## 2014-07-13 SURGICAL SUPPLY — 88 items
ADHESIVE TISSUE EXOFIN 1.0ML_PREMIERPRO EXOFIN (SEALANTS) ×2
APPL 70% ISPRP 2% CHG 26ML 13._2X13.2IN CHLRPRP PREP DEHP-FR (WOUND CARE/ENTEROSTOMAL SUPPLY)
APPL 70% ISPRP 2% CHG 26ML CHLRPRP HI-LT ORNG PREP STRL LF  DISP CLR (WOUND CARE SUPPLY) IMPLANT
APPLIER E-CLP2 SUP INTLK 29CM 10MM PSTL GRIP GLARE RST SAF INTLK HNDL 20 ML CLIP TI INTERNAL CLIP (ENDOSCOPIC SUPPLIES) IMPLANT
APPLIER E-CLP2 SUP INTLK 29CM_10MM PSTL GRIP GLARE RST SAF (INSTRUMENTS ENDOMECHANICAL)
BLADE 10 BD RB-BCK CBNSTL SURG TISS STRL LF  DISP (SURGICAL CUTTING SUPPLIES) ×1 IMPLANT
BLADE 10 BD RB-BCK SHRP CBNSTL_SURG TISS STRL LF (CUTTING ELEMENTS) ×1
BLANKET 3M BAIR HUG ADLT UPR B ODY 74X24IN PLMR 2 INCS ADH (MISCELLANEOUS PT CARE ITEMS) ×2 IMPLANT
CATH URETH BARD LUBRICATH 28FR FOLEY 2W BAL LUB SIL DDRGL 5CC STRL LTX DISP AMBR (UROLOGICAL SUPPLIES) IMPLANT
CATH URETH LBRCTH 28FR FL 2W B_AL LUB SIL DDRGL 5CC STRL LTX (UROLOGICAL SUPPLIES)
CLAMP LAPSCP ON OFF SWH RATCHET HNDL 31CM 10MM E-BBCK STD STRL DISP GRN ENDOS (ENDOSCOPIC SUPPLIES) IMPLANT
CLOSURE SKIN STRIPS 1/4X4IN_R1546 10/PK 50PK/BX (WOUND CARE/ENTEROSTOMAL SUPPLY)
CONV USE 338641 - PACK SURG LAPSCP NONST DISP LF (CUSTOM TRAYS & PACK) ×1 IMPLANT
CONV USE ITEM 156524 - ADHESIVE TISSUE EXOFIN 1.0ML_PREMIERPRO EXOFIN (SEALANTS) ×2 IMPLANT
CONV USE ITEM 338653 - PACK SURG ABDOMINAL NONST DISP LF (CUSTOM TRAYS & PACK) ×1 IMPLANT
DISCONTINUED USE ITEM 35894 - DRAPE MAG 16X10IN DVN FOAM FLXB 121 STRL LF  DISP (EQUIPMENT MINOR) IMPLANT
DISCONTINUED USE ITEM 46371 - SUTURE SILK 4-0 SH PERMAHAND 18IN BLK CR BRD 5 STRN NONAB (SUTURE/WOUND CLOSURE) ×1 IMPLANT
DONUT EXTREMITY CUSHIONING 31143137 (POSITIONING PRODUCTS) ×2 IMPLANT
DRAIN INCS .75IN 18IN PNRS RUB RADOPQ PCUT TUBE STRL LTX DISP (Drains/Resovoirs) IMPLANT
DRAIN PENROSE 3/4INX18INL STRL_30416075 (Drains/Resovoirs)
DRAPE 2 INCS FILM ANTIMIC 23X17IN IOBN STRL SURG (PROTECTIVE PRODUCTS/GARMENTS) IMPLANT
DRAPE 2 INCS FILM ANTIMIC 23X1_7IN IOBN STRL SURG (PROTECTIVE PRODUCTS/GARMENTS)
DRAPE MAG 16X10IN DVN FOAM FLX_B 121 STRL LF DISP (EQUIPMENT MINOR)
DRAPE PCH DRAIN PORT 33.5X32X14IN UNDR BUTT PRXM LF  STRL DISP SURG SMS 46X33.5IN (CUSTOM TRAYS & PACK) IMPLANT
DRAPE PCH DRAIN PORT 33.5X32X1_4IN UNDR BUTT PRXM LF STRL (CUSTOM TRAYS & PACK)
ELECTRODE ESURG 360D BLADE PNC_L STD 10FT 3/32IN VLAB STRL (CAUTERY SUPPLIES) ×2
ELECTRODE ESURG 360D PNCL STD 10FT 3/32IN VLAB STRL DISP SMOKE EVAC ATTACH CORD HLSTR (CAUTERY SUPPLIES) ×1 IMPLANT
ELECTRODE ESURG BLADE 6.5IN 3/32IN VLAB STRL SS 1IN DISP STD SHAFT XTD LF (CAUTERY SUPPLIES) ×1 IMPLANT
ELECTRODE ESURG XTD BLADE 6.5I_N 3/32IN VLAB STRL SS DISP STD (CAUTERY SUPPLIES) ×1
ENDOCATCH 15MM SPECIMEN BAG_173049 3EA/BX (INSTRUMENTS ENDOMECHANICAL)
GARMENT COMPRESS MED CALF CENTAURA NYL VASOGRAD LTWT BRTHBL SEQ FIL BLU 18- IN (ORTHOPEDICS (NOT IMPLANTS)) ×1 IMPLANT
GARMENT COMPRESS MED CALF CENT_AURA NYL VASOGRAD LTWT BRTHBL (ORTHOPEDICS (NOT IMPLANTS)) ×1
GOWN SURG 2XL AAMI L3 REINF HK_LP CLSR SET IN SLEEVE STRL LF (PROTECTIVE PRODUCTS/GARMENTS)
GOWN SURG 2XL L3 REINF HKLP CLSR SET IN SLEEVE STRL LF  DISP BLU SIRUS SMS 49IN (PROTECTIVE PRODUCTS/GARMENTS) IMPLANT
GRASPER BABCOCK ENDO 174001_6EA/BX (INSTRUMENTS ENDOMECHANICAL)
IRR SUCT STRKFL2 TIP STRL LF  DISP (IRR) IMPLANT
IRR SUCT STRKFL2 TIP STRL LF_DISP (IRR)
KIT JEJUN 45CM 22FR SIL MIC SECURLOK LL-SLIP RADOPQ UNIV FEED PORT CONN INTERNAL RETENTION BAL TAPER (FEED) IMPLANT
KIT RM TURNOVER CLEANOP CSTM INFCT CONTROL (KITS & TRAYS (DISPOSABLE)) ×1
KIT RM TURNOVER CLEANOP CSTM I_NFCT CONTROL (KITS & TRAYS (DISPOSABLE)) ×1
KIT RM TURNOVER CLEANOP CUSTOM INFCT CONTROL (KITS & TRAYS (DISPOSABLE)) ×1 IMPLANT
LABEL E-Z STICK_STLEZP1 100EA/CS (LABELS/CHART SUPPLIES) ×1
LABEL MED EZ PEEL MRKR LF (LABELS/CHART SUPPLIES) ×1 IMPLANT
LEGGINGS SURG 43X29IN CUF PRXM_SMS STRL LF DISP (DRAPE/PACKS/SHEETS/OR TOWEL)
LEGGINGS SURG 48X31IN CUF PRXM SMS 6IN STRL LF  DISP (DRAPE/PACKS/SHEETS/OR TOWEL) IMPLANT
NEEDLE INSFL 120MM 14GA STD SRGNDL PNMPRTN SPRG LD BLUNT STY STRL LF  DISP SS PLASTIC RD (ENDOSCOPIC SUPPLIES) ×1 IMPLANT
NEEDLE INSFL 120MM 14GA STD SR_GNDL PNMPRTN SPRG LD BLUNT STY (INSTRUMENTS ENDOMECHANICAL) ×1
PACK ABD (CUSTOM TRAYS & PACK) ×1
PACK LAPAROSCOPIC CUSTOM (CUSTOM TRAYS & PACK) ×1
PACK SURG ABDOMINAL NONST DISP LF (CUSTOM TRAYS & PACK) ×1
PACK SURG LAPSCP NONST DISP LF (CUSTOM TRAYS & PACK) ×1
PAD ARMBOARD FOAM BLU_FP-ECARM (POSITIONING PRODUCTS) ×2
PAD ARMBRD BLU (POSITIONING PRODUCTS) ×2 IMPLANT
POUCH SPEC RETR 34.5CM 15MM E-CTCH POLYUR FLXB LONG CYL TUBE 29.5CM STRL LF  DISP (ENDOSCOPIC SUPPLIES) IMPLANT
PRTC ALEXIS FLXB RETRACT RING ATRAUMA SLF RTN TISS MED 5-9CM INCS LF (SURGICAL INSTRUMENTS) ×1 IMPLANT
RETRACTOR WND ALEXIS 5-9CM_C8302 MED DISP 5/BX (INSTRUMENTS) ×1
SCISSOR SWITCH BLADE HOOK TIPS 895200B 5 MM 6EA/BX (INSTRUMENTS) ×1
SCISSOR SWITCH BLADE HOOK TIPS 895200B 5 MM 6EA/BX (SURGICAL INSTRUMENTS) ×1 IMPLANT
SPONGE LAP 18X18 RFD STRL_L181804P01C1 40PK/CS (WOUND CARE/ENTEROSTOMAL SUPPLY) ×2
SPONGE LAP 18X18IN STD 4 PLY XRY RF DTBL ABS RFDETECT COTTON STRL LF  DISP (WOUND CARE SUPPLY) ×2 IMPLANT
STAPLER SKIN 4.1X6.5MM 35 W STPL CART LF  APS U DISP CLR SS PLASTIC (ENDOSCOPIC SUPPLIES) IMPLANT
STAPLER SKIN WIDE 35W_8886803712 12/BX (INSTRUMENTS ENDOMECHANICAL)
STRIP 4X.25IN STRSTRP PLSTR REINF SKNCLS WHT STRL LF (WOUND CARE SUPPLY) IMPLANT
SUTURE 1 TP-1 PDS2 96IN VIOL MONOF LOOP ABS (SUTURE/WOUND CLOSURE) IMPLANT
SUTURE 1 TP-1 PDS2 96IN VIOL M_ONOF LOOP ABS (SUTURE/WOUND CLOSURE)
SUTURE SILK 2-0 SH PERMAHAND 30IN BLK BRD NONAB (SUTURE/WOUND CLOSURE) ×1 IMPLANT
SUTURE SILK 2-0 SH PERMAHAND 30IN BLK CR BRD 8 STRN NONAB (SUTURE/WOUND CLOSURE) ×1 IMPLANT
SUTURE SILK 2-0 SH PERMAHAND 3_0IN BLK BRD NONAB (SUTURE/WOUND CLOSURE) ×1
SUTURE SILK 2-0 SH PERMAHAND 3_0IN BLK CR BRD 8 STRN NONAB (SUTURE/WOUND CLOSURE) ×1
SUTURE SILK 4-0 SH PERMAHAND 18IN BLK CR BRD 5 STRN NONAB (SUTURE/WOUND CLOSURE) ×1
SUTURE SILK 4-0 SH PERMAHAND 1_8IN BLK CR BRD 5 STRN NONAB (SUTURE/WOUND CLOSURE) ×2
SUTURE SILK 4-0 SH PERMAHAND 30IN BLK BRD NONAB (SUTURE/WOUND CLOSURE) ×1 IMPLANT
SUTURE SILK 4-0 SH PERMAHAND 3_0IN BLK BRD NONAB (SUTURE/WOUND CLOSURE) ×2
SYRINGE 50ML LF  STRL GRAD N-PYRG DEHP-FR PVC FREE CATH TIP DISP CLR (NEEDLES & SYRINGE SUPPLIES) IMPLANT
SYRINGE 50ML LF STRL GRAD N-P_YRG DEHP-FR PVC FREE CATH TIP (NEEDLES & SYRINGE SUPPLIES)
TOWEL SURG WHT 25X16IN RFDETECT COTTON RADOPQ RF DTBL STRL LF  DISP (PROTECTIVE PRODUCTS/GARMENTS) IMPLANT
TOWEL SURG WHT 25X16IN RFDETEC_T COTTON RADOPQ RF DTBL OR XRY (PROTECTIVE PRODUCTS/GARMENTS)
TRAY CATH 16FR BARDEX LUBRICATH STATLK SAF-FLOW FOLEY URMTR ADV NATURAL RUB DDRGL 350ML STRL LTX (TRAY) ×1 IMPLANT
TRAY CATH 16FR BARDEX LUBRICAT_H STATLK SAF-FLOW FOLEY URMTR (TRAY) ×1
TRAY SKIN SCRUB 8IN VNYL COTTON 6 WNG 6 SPONGE STICK 2 TIP APPL DRY STRL LF (KITS & TRAYS (DISPOSABLE)) IMPLANT
TRAY SURG PREP SCR CR ESTM (KITS & TRAYS (DISPOSABLE))
TROCAR LAPSCP STD 100MM 12MM VERSAONE SMOOTH CANN BLADE STRL LF  DISP (ENDOSCOPIC SUPPLIES) IMPLANT
TROCAR LAPSCP STD 100MM 12MM VERSAONE THREAD ANCH BLUNT TIP STRL LF  DISP CLR (ENDOSCOPIC SUPPLIES) IMPLANT
TROCAR LAPSCP STD 100MM 5MM VERSAONE FIX CANN BLADE STRL LF  DISP (ENDOSCOPIC SUPPLIES) IMPLANT
TROCAR VERSA V2 5MM W/FIX_B5STF BX/6 CANNULA (INSTRUMENTS ENDOMECHANICAL)
TROCAR VERSAONE BLUNT 12MM_OB BPT12STS (INSTRUMENTS ENDOMECHANICAL)
TROCAR VERSAPORT VS 5-12MM_B12STS DISP 6EA/BX (INSTRUMENTS ENDOMECHANICAL)
TUBE JEJUN MIC SECURLOK 22FR 4_5CM RADOPQ STRP RTNT BAL (FEED)

## 2014-07-13 NOTE — Anesthesia Postprocedure Evaluation (Signed)
ANESTHESIA POSTOP EVALUATION NOTE         07/13/2014     Last Vitals: Temperature: 36.8 C (98.2 F) (07/13/14 1544)  Heart Rate: 84 (07/13/14 1544)  BP (Non-Invasive): 126/68 mmHg (07/13/14 1544)  Respiratory Rate: 18 (07/13/14 1544)  SpO2-1: 94 % (07/13/14 1544)  Pain Score (Numeric, Faces): 10 (07/13/14 1544)    Procedures:   HEMICOLECTOMY RIGHT LAPAROSCOPIC (Right )  COLECTOMY HEMI (Right )    Patient is sufficiently recovered from the effects of anesthesia to participate in the evaluation and has returned to their pre-procedure level.  I have reviewed and evaluated the following:  Respiratory Function: Consistent with pre anesthetic level  Cardiovascular Function: Consistent with pre anesthetic level  Mental Status: Return to pre anesthetic baseline level  Pain: Sufficiently controlled with medication  Nausea and Vomiting: Absent or sufficiently controlled with medication  Post-op Anesthetic Complications: None    Comment/ re-evaluation for any variations: None

## 2014-07-13 NOTE — Nurses Notes (Signed)
Family updated.

## 2014-07-13 NOTE — Nurses Notes (Signed)
Dr Kathrin Greathouse at the bedside to see pt.

## 2014-07-13 NOTE — Care Plan (Signed)

## 2014-07-13 NOTE — Progress Notes (Addendum)
Oncology Surgery Post Operative Check:    S:    Still somewhat sedated from anesthesia, arousable.  Pain under good control.  No nausea or vomiting.    O:    NAD  NTAC  Breathing unlabored  RR  Foley in place, scrotal edema  ABD S, diffusely tender, bandaids and dressings c/d/i  No cyanosis    A/P:    Transfer Onc, Jazzmon Prindle, continue SDS    No NG  aLine and Foley for tonight  NPO, OK w/ sips, ice chips and PO meds; gentle fluids; clears when more awake  CONTINUE to hold heparin drip tonight, restart in AM  AM labs unless indicated overnight  Activity as tolerated  IV pain control, refrain from tylenol and Toradol if possible; hold oral narcotics  OK w/ PCA if needed overnight  Additional dose of ABX 12 hrs post op, Hx of prosthetic aortic valve    Please feel free to call with any acute changes, questions or concerns    Roby Lofts, MD 07/13/2014, 15:57

## 2014-07-13 NOTE — Consults (Addendum)
[facility]  Surgical Oncology Consult  Follow Up    Kenneth Weeks, Kenneth Weeks, 64 y.o. male  Date of Service: 07/13/2014  Date of Birth:  02-15-1950    Hospital Day:  LOS: 3 days     Subjective/Overnight Events:  Patient doing well, NAEO. NPo since midnight for surgery     Objective:  Temperature: 36.8 C (98.2 F)  Heart Rate: 85  BP (Non-Invasive): 129/69 mmHg  Respiratory Rate: 12  SpO2-1: 96 %  Pain Score (Numeric, Faces): 0    General:  NAD.  HENT:  NTAC.  Lungs: Breathing unlabored.  Cardiovascular:  RR.  Abdomen:  S, NT, ND.  Extremities:  No cyanosis.  Skin:  Skin warm and dry.  Neurologic:  AOx3.    Psychiatric:  Affect Normal.    Labs:    BMP:     No results found for this encounter  CBC Results Differential Results   Recent Labs      07/12/14   2157   HGB  8.7*   HCT  26.5*    Recent Results (from the past 30 hour(s))   CBC/DIFF    Collection Time: 07/12/14  4:59 AM   Result Value    WBC 6.2    PMN'S 78    LYMPHOCYTES 14    MONOCYTES 7    EOSINOPHIL 1    BASOPHILS 0        Hepatic Function:    No results found for this encounter  Magnesium:     No results found for this encounter  Phosphorus:     No results found for this encounter  Coags:    Recent Labs      07/12/14   2157   APTT  39.2*       Imaging Studies:  07/11/2014 - CT A/P w/w/o contrast - [place]  Findings: There is small bilateral pleural effusions with associated atelectasis. There is the 4 mm nodule within the right lower lobe series 5 image 2. Followup with CT chest in 6-12 months is recommended. There is an additional nodule at approximately 2 mm on series 5 image 2 within the right upper lobe inferiorly.    There is cholelithiasis and mild gallbladder wall thickening. There is no biliary ductal dilatation. No evidence for hepatic mass on this single phase study. No specific evidence for sclerotic or fibrotic change. There is moderate atrophic change of the pancreas. Spleen is normal size. There are small porta hepatis  lymph nodes which are likely reactive. There is no hydronephrosis or hydroureter.    There is no evidence for bowel obstruction. No evidence for significant bowel wall thickening in a patient with known mass within the proximal right colon. The appendix is unremarkable. There is diverticulosis in the sigmoid colon without evidence of complicated disease.    There is mild mesenteric stranding and a minimal amount of free fluid within the pelvis. There is a rectal tube in place. There is a left inguinal hernia containing only fat. There are scattered small mesenteric and retroperitoneal lymph nodes without significant lymphadenopathy by size criteria.    There are degenerative changes in the spine without evidence for acute bony abnormality.    IMPRESSION:   1.Small bilateral pleural effusions. 2 pulmonary nodules, largest measuring 4 mm. Followup with CT chest in 6-12 months is recommended.  2. Cholelithiasis with mild gallbladder wall thickening. If there is concern for acute cholecystitis further evaluation with ultrasound is recommended.  3. Patient's known cecal mass  is not visualized. No evidence of obstruction. Diverticulosis without evidence for complicated disease.  4. Mild nonspecific mesenteric stranding and minimal free fluid within the pelvis.    Assessment/Recommendations:  Patient is a 64 y/o M, admitted 07/10/2014; HD  LOS: 3 days  with Hx of Marfans, mechanical aortic valve (1999) (on anticoaggulation), aFib (s/p cardioversion), DM, hypothyroidism, who presented to [facility] w/ bloody diarrhea, supratherapeutic INR and hypotension.  Admitted to MICU, INR reversed, resuscitated and started on heparin drip.  GI consulted and colonoscopy remarkable for large mass along the 1st fold of the ascending colon concerning for malignancy.  Surg Onc consulted for evaluation.  He has underwent:    1.  07/10/2014 - Colonoscopy w/ Bx    ALL:  NKDA  Meds:  Coreg, Lasix, synthroid, Mg, K, spironolactone,  coumadin    PMHx:  As above  PSHx:  As above + T&A    VSS, aFEB, URINE OUTPUT appropriate  -  No further episodes of hypotension or tachycardia  Labs reviewed  -  Hb stable @ 8  Glucose - under good control, no Hx of DM  1.  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   -  Hx of mechanical heart valve, seen by Cardiology, appreciate recs:  Will stop heparin drip stopped at midnight and restart after surgery,   -  ABX - REC covering for MRSA, discussed w/ Pharmacy, REC Vanc, flagyl and gent  -  On PPI drip  -  Albumin 2.4, CEA 4.5    Miscellaneous/Home Meds:  Coreg, synthroid; holding lasix, spironolacton and coumadin    ABX:  As above  Diet/fluids:  Diet as above  Activity:  As tolerated  Pain control:  Pending post op  GI/DVT prophylaxis:  As above  Flatus/last BM:  +/+  PT/OT ordered/dispo recs:  Pending post op  Lines:  07/11/2014 - PICC  Dispo:  Continue SDS status    Tad Moore, DO 07/13/2014, 06:59  Rickard Rhymes, DO  PGY-2 General Surgery  Pager [phone]    I saw and examined the patient.  I reviewed the resident's note.  I agree with the findings and plan of care as documented in the resident's note.  Any exceptions/additions are edited/noted.    For laparoscopic, possible open right hemicolectomy today    Gwinda Passe, MD 07/13/2014, 07:05

## 2014-07-13 NOTE — OR Surgeon (Signed)
[place] [facility] SUR[facility]                                     OPERATION SUMMARY    PATIENT NAME: Kenneth Weeks, Kenneth Weeks [facility] FAOZHY:[id number]  DATE OF SERVICE:07/13/2014  DATE OF BIRTH: 09/12/1950    PREOPERATIVE DIAGNOSES:  1.  Colon cancer.  2.  Lower gastrointestinal bleeding.  3.  Marfan syndrome.  4.  History of aortic valve replacement.    POSTOPERATIVE DIAGNOSES:  1.  Colon cancer.    2.  Lower gastrointestinal bleeding.    3.  Marfan syndrome.    4.  History of aortic valve replacement.  5.  Cirrhosis  6.  Adhesions    NAME OF PROCEDURE:  Laparoscopic right hemicolectomy.    SURGEONS:  [name], MD (staff), Roby Lofts, MD (assistant).     ANESTHESIA:  General.    ESTIMATED BLOOD LOSS:  50 mL.    INDICATIONS FOR PROCEDURE:  [name] is a 64 year old male with a history of Marfan syndrome.  He had an aortic valve replacement with a [employer] Jude aortic valve as well as an ascending aortic graft placed about 15 years ago in [place].  He has been on warfarin since this cardiac procedure and was in his usual state of health until several days prior to admission when he had multiple episodes of bloody bowel movements.  This was accompanied by a near syncopal event. He presented to an outside hospital where he was noted to be anemic and coagulopathic.  He was transfused blood and FFP and was transferred to [facility]. He underwent upper endoscopy and colonoscopy to identify the source of GI bleeding. He was found to have a friable-appearing mass in the ascending colon.  This was biopsied and proven to be adenocarcinoma.  His Hgb stabilized around 8 or 8.5.  I was consulted and recommended laparoscopic right hemicolectomy.  He underwent perioperative risk stratification by cardiology and was deemed to be an appropriate candidate for the proposed surgery.  After being counseled as to the risks, benefits and  alternatives of the surgery, he signed informed consent and wished to proceed.    OPERATIVE FINDINGS:  The liver was cirrhotic appearing.  There were adhesions of the terminal ileum to the retroperitoneum and sigmoid colon.  This rendered the mobilization of the ileum more difficult than is usual.  There was no evidence of peritoneal carcinomatosis.  There was no evidence of obvious liver metastases.  The duodenum was identified and preserved throughout the entirety of the dissection.    DESCRIPTION OF PROCEDURE:  The patient was identified in the preoperative holding area.  He was brought to the operating room and placed supine on the operating room table.  He underwent the uneventful induction of general endotracheal anesthesia.  A Foley catheter was inserted.  All appropriate lines, tubes and monitors were placed by the anesthesia staff.  His anterior abdominal wall was shaved, prepped and draped in the usual sterile fashion.  He received a dose of antibiotics in accordance with SCIP guidelines.  A preoperative pause was performed.  A 5-mm stab incision was made in the left upper quadrant through which a Veress needle was  inserted.  The abdominal cavity was insufflated to a pressure of 15 mmHg using carbon dioxide gas.  Under direct vision, additional trocars were placed.  A 10-mm trocar was placed in the infraumbilical position and two additional 5-mm trocars were placed, 1 in the suprapubic position and 1 in the left lateral abdominal wall in the lower abdomen.  Using this port configuration, the white line of Toldt was released and the cecum, ascending colon and ascending colon mesentery were released from the right retroperitoneum.  In the process of performing this dissection, the duodenum was identified and preserved.  Once the colon was mobilized from lateral to medial, we then identified the ileocolic pedicle and dissected circumferentially around it and were able to transect it at its base using a single  fire of the Endo GIA white stapler.  At this point, with the right colon and hepatic flexure medialized, we created a periumbilical utility incision to extract the specimen and perform an extracorporeal anastomosis.  The cecum was grasped and brought up through the extraction site.  The terminal ileum was noted to be tethered to the right retroperitoneum and to the sigmoid colon.  We lengthened the extraction site by about a centimeter both cephalad and caudally and released the attachments of the terminal ileum to the retroperitoneum and sigmoid colon.  This gave [place] good mobilization and freed the bowel completely.  At this point, we transected the bowel proximal to the ileocecal valve using a single fire of the Endo GIA blue stapler.  The colon was transected in a similar manner along the proximal transverse colon.  The remaining attachments of the mesocolon were released using a combination of LigaSure as well as silk ties.  After the specimen was detached, it was handed off the field.  We then proceeded to align the bowel and colon to one another using 4-0 silk suture material placed in a seromuscular manner.  We then placed an Alexis wound protector and using the cautery made a colotomy and enterotomy after which we created the anastomosis using a single fire of the Endo GIA blue stapler.  The common enterotomy was closed using 3-0 Vicryl run from either end and tied in the middle.  The anterior surface of the staple line was reinforced using 4-0 silk placed in a seromuscular manner.  The mesenteric defect was left open.  The anastomosis was placed intracorporeally, the abdominal cavity was irrigated copiously with saline until it ran clear and dry. The midline fascia of the extraction site was closed using 0 Vicryl placed in an interrupted figure-of-eight manner.  We irrigated out the subcutaneous tissues.  We reinsufflated the abdominal cavity through the left upper quadrant trocar site and upon reinspection  of the abdominal cavity found no evidence of intraabdominal issue(s).  We suctioned out all the remaining irrigant until it ran clear and dry.  We then removed the Veress needle port after desufflating the abdominal cavity through it.  We then inspected the specimen on the back table and confirmed that we had in fact removed the tumor with the specimen.  We then closed the incisions at the level of the skin using 4-0 Vicryl placed in a buried subcuticular manner.  The incisions were wiped clean and dry.  Mastisol, Steri-Strips and sterile bandages were applied.  The patient was allowed to emerge from anesthesia.  He was extubated and brought to the recovery room in satisfactory condition.  Please note that I was present, scrubbed and directly performed or assisted  Dr. Donnie Aho in all the aspects that I dictated.      [name], MD  Assistant Professor  [facility] Department of Surgery    KI/SN/[id number]; D: 07/13/2014 13:21:53; T: 07/13/2014 18:16:24

## 2014-07-13 NOTE — Nurses Notes (Signed)
Dr Kathrin Greathouse updated, pt lethargic, arousable to verbal stimuli, VSS. Pt cleared for discharge.

## 2014-07-13 NOTE — Care Management Notes (Signed)
[facility]  Care Management Note    Patient Name: Kenneth Weeks  Date of Birth: May 04, 1950  Sex: male  Date/Time of Admission: 07/10/2014  4:09 PM  Room/Bed: OR 5 N INTRA-OP/NONE  Payor: MEDICARE / Plan: MEDICARE PART A AND B / Product Type: Medicare /    LOS: 3 days   PCP: Viona Gilmore, MD    Admitting Diagnosis:  GI bleed [K92.2]    Assessment:      07/13/14 1157   Assessment Details   Assessment Type Continued Assessment   Date of Care Management Update 07/13/14   Date of Next DCP Update 07/16/14   Care Management Plan   Discharge Planning Status plan in progress   Projected Discharge Date 07/20/14   Discharge Needs Assessment   Discharge Facility/Level Of Care Needs Home vs SNF   Per TBR, pt to OR today for right hemi colectomy.  He is s/p colonoscopy and found to have large colonic mass.  PT/OT evaluated and feels pt will likely be able to return to home however they will re-assess post surgery.  Will follow.    Discharge Plan:  Home vs SNF      The patient will continue to be evaluated for developing discharge needs.     Case Manager: Ted Mcalpine [place], SW 07/13/2014, 11:58  Phone: [phone]

## 2014-07-13 NOTE — OR Surgeon (Addendum)
[place]                                       BRIEF OPERATIVE NOTE    Patient Name: [facility] Number: [id number]  Date of Birth: 04-Sep-1950  Date of Service: 07/13/2014    Pre-Operative Diagnosis:    1.  Lower GI bleed  2.  Adenocarcinoma of the ascending colon  Post-Operative Diagnosis:   1.  Lower GI bleed  2.  Adenocarcinoma of the ascending colon    Procedure(s)/Description:  Laparoscopic right hemicolectomy    Findings/Complexity (inherent to the procedure performed):   1.  Cirrhosis  2.  Adhesive bands of small bowel mesentery to retroperitoneum and sigmoid colon  3.  Specimen opened on back table, ~3cm sessile mass to proximal ascending colon, friable w/ heaped up edges    Attending Surgeon: Everette Mall  Assistant(s): Eloise Levels    Anesthesia Type: General endotracheal anesthesia  Estimated Blood Loss:  less than 100 ml  Blood Given: 0 cc  Fluids Given: 1700 cc crystalloids  Complications (unintended/unexpected/iatrogenic/accidental/inadvertent events):  None  Wound Class: Clean Contaminated Wounds -Respiratory, GI, Genital, or Urinary    Tubes: None  Drains: None  Specimens/ Cultures: Right colon  Implants: None           Disposition: PACU - hemodynamically stable.  Condition: stable    Roby Lofts, MD 07/13/2014, 15:57      I saw and examined the patient.  I reviewed the resident's note.  I agree with the findings and plan of care as documented in the resident's note.  Any exceptions/additions are edited/noted.    Gwinda Passe, MD 07/13/2014, 16:26

## 2014-07-13 NOTE — Anesthesia Transfer of Care (Signed)
ANESTHESIA TRANSFER OF CARE NOTE                Last Vitals: Temperature: 36.4 C (97.5 F) (07/13/14 1336)  Heart Rate: 92 (07/13/14 1336)  BP (Non-Invasive): 112/73 mmHg (07/13/14 1336)  Respiratory Rate: 16 (07/13/14 1336)  SpO2-1: 96 % (07/13/14 1336)  Pain Score (Numeric, Faces): 0 (07/13/14 0400)    Pt to pacu on o2 via fm. Vss. Report given to rn.    6/3/2016at 13:36.

## 2014-07-13 NOTE — Care Plan (Signed)
Problem: General Plan of Care(Adult,OB)  Goal: Plan of Care Review(Adult,OB)  The patient and/or their representative will communicate an understanding of their plan of care   Outcome: Ongoing (see interventions/notes)  Admission day 3 GI bleed  Alert and oriented  Vitals stable. A-fib 70s-100s. Heparin drip stopped per orders at midnight  Plan for hemicolectomy today  Goal: Individualization/Patient Specific Goal(Adult/OB)  Outcome: Ongoing (see interventions/notes)

## 2014-07-14 LAB — TYPE AND SCREEN
ABO/RH(D): B POS
ANTIBODY SCREEN: NEGATIVE
UNIT DIVISION: 0
UNIT DIVISION: 0
UNIT DIVISION: 0
UNIT DIVISION: 0
UNIT DIVISION: 0
UNITS ORDERED: 5

## 2014-07-14 LAB — BASIC METABOLIC PANEL
ANION GAP: 7 mmol/L (ref 4–13)
BUN/CREAT RATIO: 8 (ref 6–22)
BUN: 7 mg/dL — ABNORMAL LOW (ref 8–25)
CALCIUM: 7.9 mg/dL — ABNORMAL LOW (ref 8.5–10.4)
CARBON DIOXIDE: 21 mmol/L — ABNORMAL LOW (ref 22–32)
CHLORIDE: 107 mmol/L (ref 96–111)
CREATININE: 0.85 mg/dL (ref 0.62–1.27)
ESTIMATED GLOMERULAR FILTRATION RATE: >59 ml/min/1.73m2 (ref >59)
GLUCOSE,NONFAST: 112 mg/dL (ref 65–139)
POTASSIUM: 4 mmol/L (ref 3.5–5.1)
SODIUM: 135 mmol/L — ABNORMAL LOW (ref 136–145)

## 2014-07-14 LAB — HEPATIC FUNCTION PANEL
ALBUMIN: 2.3 g/dL — ABNORMAL LOW (ref 3.4–4.8)
ALKALINE PHOSPHATASE: 48 U/L (ref <150)
ALT (SGPT): 8 U/L (ref <55)
AST (SGOT): 15 U/L (ref 8–48)
BILIRUBIN, TOTAL: 1.6 mg/dL — ABNORMAL HIGH (ref 0.3–1.3)
BILIRUBIN,CONJUGATED: 0.8 mg/dL — ABNORMAL HIGH (ref <0.3)
TOTAL PROTEIN: 5.3 g/dL — ABNORMAL LOW (ref 6.0–8.0)

## 2014-07-14 LAB — CBC/DIFF
BASOPHILS: 0 %
BASOS ABS: 0.023 THOU/uL (ref 0.000–0.200)
EOS ABS: 0.045 THOU/uL (ref 0.000–0.500)
EOSINOPHIL: 1 %
HCT: 27.3 % — ABNORMAL LOW (ref 36.7–47.0)
HGB: 8.8 g/dL — ABNORMAL LOW (ref 12.5–16.3)
LYMPHOCYTES: 6 %
LYMPHS ABS: 0.522 THOU/uL — ABNORMAL LOW (ref 1.000–4.800)
MCH: 26.9 pg — ABNORMAL LOW (ref 27.4–33.0)
MCHC: 32.3 g/dL — ABNORMAL LOW (ref 32.5–35.8)
MCV: 83.4 fL (ref 78–100)
MONOCYTES: 7 %
MONOS ABS: 0.661 THOU/uL (ref 0.300–1.000)
MPV: 8.8 fL (ref 7.5–11.5)
PLATELET COUNT: 146 THOU/uL (ref 140–450)
PMN ABS: 7.632 10*3/uL (ref 1.500–7.700)
PMN'S: 86 %
RBC: 3.27 MIL/uL — ABNORMAL LOW (ref 4.06–5.63)
RDW: 19.3 % — ABNORMAL HIGH (ref 12.0–15.0)
WBC: 8.9 10*3/uL (ref 3.5–11.0)

## 2014-07-14 LAB — MAGNESIUM: MAGNESIUM: 1.6 mg/dL (ref 1.6–2.5)

## 2014-07-14 LAB — PERFORM POC WHOLE BLOOD GLUCOSE
GLUCOSE, POINT OF CARE: 112 mg/dL — ABNORMAL HIGH (ref 70–105)
GLUCOSE, POINT OF CARE: 107 mg/dL — ABNORMAL HIGH (ref 70–105)
GLUCOSE, POINT OF CARE: 119 mg/dL — ABNORMAL HIGH (ref 70–105)
GLUCOSE, POINT OF CARE: 122 mg/dL — ABNORMAL HIGH (ref 70–105)

## 2014-07-14 LAB — PTT (PARTIAL THROMBOPLASTIN TIME)
APTT: 38.9 s — ABNORMAL HIGH (ref 25.1–36.5)
APTT: 92 s — ABNORMAL HIGH (ref 25.1–36.5)

## 2014-07-14 LAB — PT/INR
INR: 1.51 — ABNORMAL HIGH (ref 0.80–1.20)
PROTHROMBIN TIME: 16.8 s — ABNORMAL HIGH (ref 9.0–13.4)

## 2014-07-14 LAB — PHOSPHORUS: PHOSPHORUS: 3 mg/dL (ref 2.3–4.0)

## 2014-07-14 MED ORDER — HEPARIN (PORCINE) 5,000 UNITS/ML BOLUS
8500.0000 [IU] | Freq: Once | INTRAMUSCULAR | Status: AC
Start: 2014-07-14 — End: 2014-07-14
  Administered 2014-07-14: 8500 [IU] via INTRAVENOUS
  Filled 2014-07-14 (×2): qty 1

## 2014-07-14 MED ORDER — HEPARIN (PORCINE) 25,000 UNIT/250 ML (100 UNIT/ML) IN DEXTROSE 5 % IV
1500.0000 [IU]/h | INTRAVENOUS | Status: DC
Start: 2014-07-14 — End: 2014-07-18
  Administered 2014-07-14 (×12): 1500 [IU]/h via INTRAVENOUS
  Administered 2014-07-15 (×2): 1600 [IU]/h via INTRAVENOUS
  Administered 2014-07-15 (×2): 1700 [IU]/h via INTRAVENOUS
  Administered 2014-07-15 (×2): 1600 [IU]/h via INTRAVENOUS
  Administered 2014-07-15: 1700 [IU]/h via INTRAVENOUS
  Administered 2014-07-15 (×3): 1600 [IU]/h via INTRAVENOUS
  Administered 2014-07-15: 2000 [IU]/h via INTRAVENOUS
  Administered 2014-07-15: 1600 [IU]/h via INTRAVENOUS
  Administered 2014-07-15: 1700 [IU]/h via INTRAVENOUS
  Administered 2014-07-15 (×2): 1600 [IU]/h via INTRAVENOUS
  Administered 2014-07-15: 1700 [IU]/h via INTRAVENOUS
  Administered 2014-07-15: 1600 [IU]/h via INTRAVENOUS
  Administered 2014-07-15 (×2): 1700 [IU]/h via INTRAVENOUS
  Administered 2014-07-15 (×2): 1600 [IU]/h via INTRAVENOUS
  Administered 2014-07-15: 2000 [IU]/h via INTRAVENOUS
  Administered 2014-07-15: 1600 [IU]/h via INTRAVENOUS
  Administered 2014-07-15: 1700 [IU]/h via INTRAVENOUS
  Administered 2014-07-16 (×19): 2000 [IU]/h via INTRAVENOUS
  Administered 2014-07-16: 2100 [IU]/h via INTRAVENOUS
  Administered 2014-07-16 (×4): 2000 [IU]/h via INTRAVENOUS
  Administered 2014-07-16: 2100 [IU]/h via INTRAVENOUS
  Administered 2014-07-16: 2000 [IU]/h via INTRAVENOUS
  Administered 2014-07-17 (×9): 2100 [IU]/h via INTRAVENOUS
  Administered 2014-07-17: 2200 [IU]/h via INTRAVENOUS
  Administered 2014-07-17 (×2): 2100 [IU]/h via INTRAVENOUS
  Filled 2014-07-14 (×7): qty 25000

## 2014-07-14 MED ORDER — ACETAMINOPHEN 1,000 MG/100 ML (10 MG/ML) INTRAVENOUS SOLUTION
1000.00 mg | Freq: Four times a day (QID) | INTRAVENOUS | Status: AC
Start: 2014-07-14 — End: 2014-07-15
  Administered 2014-07-14: 0 mg via INTRAVENOUS
  Administered 2014-07-14 (×3): 1000 mg via INTRAVENOUS
  Administered 2014-07-14: 0 mg via INTRAVENOUS
  Administered 2014-07-15: 1000 mg via INTRAVENOUS
  Administered 2014-07-15: 0 mg via INTRAVENOUS
  Filled 2014-07-14 (×4): qty 100

## 2014-07-14 MED ORDER — MAGNESIUM SULFATE 4 GRAM/100 ML (4 %) IN WATER INTRAVENOUS PIGGYBACK
4.0000 g | INJECTION | Freq: Once | INTRAVENOUS | Status: AC
Start: 2014-07-14 — End: 2014-07-14
  Administered 2014-07-14: 4 g via INTRAVENOUS
  Filled 2014-07-14: qty 100

## 2014-07-14 MED ORDER — OXYCODONE 5 MG TABLET
10.0000 mg | ORAL_TABLET | ORAL | Status: DC | PRN
Start: 2014-07-14 — End: 2014-07-18
  Filled 2014-07-14: qty 2

## 2014-07-14 MED ORDER — ONDANSETRON HCL (PF) 4 MG/2 ML INJECTION SOLUTION
4.00 mg | Freq: Three times a day (TID) | INTRAMUSCULAR | Status: DC | PRN
Start: 2014-07-14 — End: 2014-07-16
  Administered 2014-07-14 – 2014-07-15 (×3): 4 mg via INTRAVENOUS
  Filled 2014-07-14 (×3): qty 2

## 2014-07-14 MED ORDER — METOCLOPRAMIDE 5 MG/ML INJECTION SOLUTION
10.0000 mg | Freq: Three times a day (TID) | INTRAMUSCULAR | Status: DC
Start: 2014-07-14 — End: 2014-07-17
  Administered 2014-07-14 – 2014-07-17 (×9): 10 mg via INTRAVENOUS
  Filled 2014-07-14 (×11): qty 2

## 2014-07-14 MED ORDER — OXYCODONE 5 MG TABLET
5.0000 mg | ORAL_TABLET | ORAL | Status: DC | PRN
Start: 2014-07-14 — End: 2014-07-18

## 2014-07-14 MED ADMIN — HYDROcodone 5 mg-acetaminophen 325 mg tablet: INTRAVENOUS

## 2014-07-14 MED ADMIN — nicotine 14 mg/24 hr daily transdermal patch: ORAL | @ 06:00:00

## 2014-07-14 MED ADMIN — methylPREDNISolone sodium succinate 40 mg solution for injection: INTRAVENOUS | @ 17:00:00

## 2014-07-14 MED ADMIN — sodium chloride 0.9 % intravenous solution: @ 15:00:00 | NDC 00338004904

## 2014-07-14 MED ADMIN — DEXTROSE 5% 1/2 NORMAL SALINE W/ ADDITIVES: INTRAVENOUS | @ 12:00:00

## 2014-07-14 NOTE — Nurses Notes (Signed)
Received to room 779B from [facility]. S/P hemicolectomy. Oriented to unit routines and call bell use. See doc flow sheet for full assessment.

## 2014-07-14 NOTE — Nurses Notes (Signed)
PTT value back at 92.0  According to heparin protocol there is no rate change.   First therapeutic PTT.

## 2014-07-14 NOTE — Progress Notes (Addendum)
[facility]  Surgical Oncology Consult  Follow Up    Kenneth Weeks, Kenneth Weeks, 64 y.o. male  Date of Service: 07/14/2014  Date of Birth:  27-Mar-1950    Hospital Day:  LOS: 4 days     Subjective/Overnight Events:  Patient doing well. Complains of some soreness when moving, working with IS, tolerating clears     Objective:  Temperature: 36.8 C (98.2 F)  Heart Rate: 97  BP (Non-Invasive): 109/65 mmHg  Respiratory Rate: 19  SpO2-1: 97 %  Pain Score (Numeric, Faces): 8    General:  NAD.  HENT:  NTAC.  Lungs: Breathing unlabored.  Cardiovascular:  RR.  Abdomen:  S, NT, ND. Incision clean/dry/intact  Extremities:  No cyanosis.  Skin:  Skin warm and dry.  Neurologic:  AOx3.    Psychiatric:  Affect Normal.    Labs:    BMP:     Recent Labs      07/14/14   0621   SODIUM  135*   POTASSIUM  4.0   CHLORIDE  107   CO2  21*   BUN  7*   CREATININE  0.85   GLUCOSENF  112   ANIONGAP  7   BUNCRRATIO  8   GFR  >59   CALCIUM  7.9*     CBC Results Differential Results   Recent Labs      07/14/14   0621   WBC  8.9   HGB  8.8*   HCT  27.3*   PLTCNT  146    Recent Results (from the past 30 hour(s))   CBC/DIFF    Collection Time: 07/14/14  6:21 AM   Result Value    WBC 8.9    PMN'S 86    LYMPHOCYTES 6    MONOCYTES 7    EOSINOPHIL 1    BASOPHILS 0        Hepatic Function:    Recent Labs      07/14/14   0621   TOTALPROTEIN  5.3*   ALBUMIN  2.3*   TOTBILIRUBIN  1.6*   BILIRUBINCON  0.8*   AST  15   ALT  8   ALKPHOS  48     Magnesium:     Recent Labs      07/14/14   0621   MAGNESIUM  1.6     Phosphorus:     Recent Labs      07/14/14   0621   PHOSPHORUS  3.0     Coags:    Recent Labs      07/14/14   0621   PROTHROMTME  16.8*   INR  1.51*   APTT  38.9*       Imaging Studies:  07/11/2014 - CT A/P w/w/o contrast - [place]  Findings: There is small bilateral pleural effusions with associated atelectasis. There is the 4 mm nodule within the right lower lobe series 5 image 2. Followup with CT chest in 6-12 months is recommended. There is  an additional nodule at approximately 2 mm on series 5 image 2 within the right upper lobe inferiorly.    There is cholelithiasis and mild gallbladder wall thickening. There is no biliary ductal dilatation. No evidence for hepatic mass on this single phase study. No specific evidence for sclerotic or fibrotic change. There is moderate atrophic change of the pancreas. Spleen is normal size. There are small porta hepatis lymph nodes which are likely reactive. There is no hydronephrosis or hydroureter.  There is no evidence for bowel obstruction. No evidence for significant bowel wall thickening in a patient with known mass within the proximal right colon. The appendix is unremarkable. There is diverticulosis in the sigmoid colon without evidence of complicated disease.    There is mild mesenteric stranding and a minimal amount of free fluid within the pelvis. There is a rectal tube in place. There is a left inguinal hernia containing only fat. There are scattered small mesenteric and retroperitoneal lymph nodes without significant lymphadenopathy by size criteria.    There are degenerative changes in the spine without evidence for acute bony abnormality.    IMPRESSION:   1.Small bilateral pleural effusions. 2 pulmonary nodules, largest measuring 4 mm. Followup with CT chest in 6-12 months is recommended.  2. Cholelithiasis with mild gallbladder wall thickening. If there is concern for acute cholecystitis further evaluation with ultrasound is recommended.  3. Patient's known cecal mass is not visualized. No evidence of obstruction. Diverticulosis without evidence for complicated disease.  4. Mild nonspecific mesenteric stranding and minimal free fluid within the pelvis.    Assessment/Recommendations:  Patient is a 64 y/o M, admitted 07/10/2014; HD  LOS: 4 days  with Hx of Marfans, mechanical aortic valve (1999) (on anticoaggulation), aFib (s/p cardioversion), DM, hypothyroidism, who presented to  [facility] w/ bloody diarrhea, supratherapeutic INR and hypotension.  Admitted to MICU, INR reversed, resuscitated and started on heparin drip.  GI consulted and colonoscopy remarkable for large mass along the 1st fold of the ascending colon concerning for malignancy.  Surg Onc consulted for evaluation.  He has underwent:    1.  07/10/2014 - Colonoscopy w/ Bx  bx results: adenocarcinoma     2. laproscopic Right hemicolectomy with stapled anastomosis    ALL:  NKDA  Meds:  Coreg, Lasix, synthroid, Mg, K, spironolactone, coumadin    PMHx:  As above  PSHx:  As above + T&A    VSS, aFEB, URINE OUTPUT appropriate    Labs reviewed  -  Hb stable @ 8.8  Glucose - under good control, no Hx of DM  1.  Lower GI bleed secondary to supratherapic INR aand ascending colon mass concerning for malignancy  - s/p OR for right hemi colectomy   -Pathology result from biopsy shows adenocarcinoma   -  Hx of mechanical heart valve, seen by Cardiology, appreciate recs:    - plan to resstart heparin drip today, coumadin to restart potentially tomorrow  -  Albumin 2.4, CEA 4.5    Miscellaneous/Home Meds:  Coreg, synthroid; holding lasix, spironolacton and coumadin    ABX:  As above  Diet/fluids:  Clear, continue today  Activity:  As tolerated  Pain control:  Will convert to po oxycodone for pain control  GI/DVT prophylaxis:  As above  Flatus/last BM:  +/+  PT/OT ordered/dispo recs:  ordered  Lines:  07/11/2014 - PICC, foley to remain in place secondary to hydorcele, scrotal elevation ordered  Dispo:  Transfer to [employer], DO 07/14/2014, 08:04  Rickard Rhymes, DO  PGY-2 General Surgery  Pager [phone]    I saw and examined the patient.  I reviewed the resident's note.  I agree with the findings and plan of care as documented in the resident's note.  Any exceptions/additions are edited/noted.    Clear liquids today    Increase activity/pulmonary toilet    Heparin gtts for history of st jude's aortic valve (goal PTT 60-80)    Transfer to  7E  Gwinda Passe, MD 07/14/2014, 08:18

## 2014-07-14 NOTE — Nurses Notes (Signed)
Pt reporting nausea. No PRNs available. Service notified. Will continue to monitor.

## 2014-07-14 NOTE — Nurses Notes (Signed)
Upon assessment, patient's LUE edematous and asymmetrical compared to RUE. Redness and warmth noted just below PICC insertion site. Paged Ardeth Perfect to notify. Awaiting call back. Will continue to monitor.

## 2014-07-14 NOTE — Care Plan (Signed)
Problem: General Plan of Care(Adult,OB)  Goal: Plan of Care Review(Adult,OB)  The patient and/or their representative will communicate an understanding of their plan of care   Outcome: Ongoing (see interventions/notes)  Tolerated mobility well for first try after surgery.  Anticipate this patient will be able to go directly home from here when medically ready for discharge.    Updated mobility goals:  Patient will transfer independently by D/C to home.  Patient will ambulate independently 250 ft with FWW (if needed) by D/C to home.  Patient will ambulate up/down 1 flight steps with supervision by D/C to home.      Plan:    Follow at least 3 times per week for exercise, mobility as tolerated.

## 2014-07-14 NOTE — PT Treatment (Signed)
Physical Therapy Treatment Note    Subjective:   Alert, cooperative before and during tx.  Drowsy after tx..  Wife present.  Pain Assessment:   6/10 abdominal pain before tx and 7/10 after tx.  RN hung IV tylenol before mobility.   RN notified of pain level after tx.         Objective:   Patient is now s/p laparoscopic R hemicolectomy, with anastomosis done 07-13-14 for colon mass.  Supine to sidelying to sit with contact guard assist of 1 and verbal cues for technique.  Sit to stand with min assist of 1 for balance.  Took several sideward steps bed to chair, distance of ~ 3 ft with mod assist of 1 for balance. Small steps. Patient held onto this PT's arms for balance.  Stand to sit with contact guard assist of 1.  Obtained FWW for patient to use with nursing staff assist during further mobility.  Instructed patient in proper sit to stand placing hands on chair not on FWW for sit to stand.  Cautioned patient that he should mobilize with assist only.  Left up in chair with call button within reach and needs met.  Wife present.    Assessment:   Tolerated mobility well for first try after surgery.  Anticipate this patient will be able to go directly home from here when medically ready for discharge.    Updated mobility goals:  Patient will transfer independently by D/C to home.  Patient will ambulate independently 250 ft with FWW (if needed) by D/C to home.  Patient will ambulate up/down 1 flight steps with supervision by D/C to home.    Plan:   Follow at least 3 times per week for exercise, mobility as tolerated.    Therapist :   Treatment Time: 42 minutes  Therapist: Darra Lis, PT  Pager #: [phone]

## 2014-07-15 LAB — PTT (PARTIAL THROMBOPLASTIN TIME)
APTT: 45.8 s — ABNORMAL HIGH (ref 25.1–36.5)
APTT: 55.5 s — ABNORMAL HIGH (ref 25.1–36.5)
APTT: 57.1 s — ABNORMAL HIGH (ref 25.1–36.5)
APTT: 70.9 s — ABNORMAL HIGH (ref 25.1–36.5)

## 2014-07-15 LAB — CBC/DIFF
BASOPHILS: 1 %
BASOS ABS: 0.032 10*3/uL (ref 0.000–0.200)
EOS ABS: 0.045 THOU/uL (ref 0.000–0.500)
EOSINOPHIL: 1 %
HCT: 27.9 % — ABNORMAL LOW (ref 36.7–47.0)
HGB: 8.9 g/dL — ABNORMAL LOW (ref 12.5–16.3)
LYMPHOCYTES: 6 %
LYMPHS ABS: 0.439 10*3/uL — ABNORMAL LOW (ref 1.000–4.800)
MCH: 26.4 pg — ABNORMAL LOW (ref 27.4–33.0)
MCHC: 31.8 g/dL — ABNORMAL LOW (ref 32.5–35.8)
MCV: 83.1 fL (ref 78–100)
MONOCYTES: 7 %
MONOS ABS: 0.46 10*3/uL (ref 0.300–1.000)
MONOS ABS: 0.46 10*3/uL (ref 0.300–1.000)
MPV: 8.4 fL (ref 7.5–11.5)
PLATELET COUNT: 140 THOU/uL (ref 140–450)
PMN ABS: 6.076 THOU/uL (ref 1.500–7.700)
PMN'S: 85 %
RBC: 3.36 MIL/uL — ABNORMAL LOW (ref 4.06–5.63)
RDW: 19.4 % — ABNORMAL HIGH (ref 12.0–15.0)
WBC: 7.1 10*3/uL (ref 3.5–11.0)

## 2014-07-15 LAB — BASIC METABOLIC PANEL
ANION GAP: 9 mmol/L (ref 4–13)
BUN/CREAT RATIO: 7 (ref 6–22)
BUN: 6 mg/dL — ABNORMAL LOW (ref 8–25)
CALCIUM: 7.9 mg/dL — ABNORMAL LOW (ref 8.5–10.4)
CARBON DIOXIDE: 20 mmol/L — ABNORMAL LOW (ref 22–32)
CHLORIDE: 104 mmol/L (ref 96–111)
CREATININE: 0.84 mg/dL (ref 0.62–1.27)
ESTIMATED GLOMERULAR FILTRATION RATE: >59 mL/min/{1.73_m2} (ref >59)
GLUCOSE,NONFAST: 123 mg/dL (ref 65–139)
POTASSIUM: 3.8 mmol/L (ref 3.5–5.1)
SODIUM: 133 mmol/L — ABNORMAL LOW (ref 136–145)

## 2014-07-15 LAB — ADULT ROUTINE BLOOD CULTURE, SET OF 2 BOTTLES (BACTERIA AND YEAST)
CULTURE OBSERVATION: NO GROWTH
CULTURE OBSERVATION: NO GROWTH
SPECIAL REQUESTS: 1

## 2014-07-15 LAB — PERFORM POC WHOLE BLOOD GLUCOSE
GLUCOSE, POINT OF CARE: 112 mg/dL — ABNORMAL HIGH (ref 70–105)
GLUCOSE, POINT OF CARE: 123 mg/dL — ABNORMAL HIGH (ref 70–105)

## 2014-07-15 LAB — HEMOCHROMATOSIS HFE GENE ANALYSIS, BLOOD

## 2014-07-15 LAB — MAGNESIUM: MAGNESIUM: 1.8 mg/dL (ref 1.6–2.5)

## 2014-07-15 LAB — PT/INR
INR: 1.5 — ABNORMAL HIGH (ref 0.80–1.20)
PROTHROMBIN TIME: 16.7 s — ABNORMAL HIGH (ref 9.0–13.4)

## 2014-07-15 LAB — PHOSPHORUS: PHOSPHORUS: 3.2 mg/dL (ref 2.3–4.0)

## 2014-07-15 MED ORDER — HEPARIN (PORCINE) 5,000 UNITS/ML BOLUS
INTRAMUSCULAR | Status: AC
Start: 2014-07-15 — End: 2014-07-16
  Filled 2014-07-15: qty 1

## 2014-07-15 MED ORDER — WARFARIN 5 MG TABLET
5.0000 mg | ORAL_TABLET | Freq: Every evening | ORAL | Status: DC
Start: 2014-07-15 — End: 2014-07-16
  Administered 2014-07-15: 5 mg via ORAL
  Filled 2014-07-15 (×2): qty 1

## 2014-07-15 MED ORDER — SPIRONOLACTONE 25 MG TABLET
50.0000 mg | ORAL_TABLET | Freq: Every day | ORAL | Status: DC
Start: 2014-07-15 — End: 2014-07-18
  Administered 2014-07-15 – 2014-07-18 (×4): 50 mg via ORAL
  Filled 2014-07-15 (×4): qty 2

## 2014-07-15 MED ORDER — HEPARIN (PORCINE) 5,000 UNITS/ML BOLUS FOR DOSE ADJUSTMENT
3000.0000 [IU] | Freq: Once | INTRAMUSCULAR | Status: DC
Start: 2014-07-15 — End: 2014-07-15

## 2014-07-15 MED ORDER — HEPARIN (PORCINE) 5,000 UNITS/ML BOLUS FOR DOSE ADJUSTMENT
3500.0000 [IU] | Freq: Once | INTRAMUSCULAR | Status: AC
Start: 2014-07-15 — End: 2014-07-15
  Administered 2014-07-15: 3500 [IU] via INTRAVENOUS
  Filled 2014-07-15: qty 1

## 2014-07-15 MED ORDER — HEPARIN (PORCINE) 5,000 UNITS/ML BOLUS FOR DOSE ADJUSTMENT
3500.0000 [IU] | Freq: Once | INTRAMUSCULAR | Status: AC
Start: 2014-07-15 — End: 2014-07-15
  Administered 2014-07-15: 3500 [IU] via INTRAVENOUS

## 2014-07-15 MED ORDER — HEPARIN (PORCINE) 5,000 UNITS/ML BOLUS FOR DOSE ADJUSTMENT
7000.0000 [IU] | Freq: Once | INTRAMUSCULAR | Status: AC
Start: 2014-07-15 — End: 2014-07-15
  Administered 2014-07-15: 7000 [IU] via INTRAVENOUS
  Filled 2014-07-15: qty 2

## 2014-07-15 MED ORDER — FUROSEMIDE 40 MG TABLET
40.00 mg | ORAL_TABLET | Freq: Three times a day (TID) | ORAL | Status: DC
Start: 2014-07-15 — End: 2014-07-18
  Administered 2014-07-15 – 2014-07-18 (×8): 40 mg via ORAL
  Filled 2014-07-15 (×12): qty 1

## 2014-07-15 MED ADMIN — lactated Ringers intravenous solution: INTRAVENOUS | @ 10:00:00 | NDC 00338011704

## 2014-07-15 MED ADMIN — heparin (porcine) 25,000 unit/250 mL (100 unit/mL) in dextrose 5 % IV: INTRAVENOUS | @ 08:00:00

## 2014-07-15 MED ADMIN — EPINEPHRINE 1 MG/ML 1 ML IN BSS 500 ML: ORAL | @ 19:00:00 | NDC 00409724101

## 2014-07-15 NOTE — Nurses Notes (Signed)
Pt had a 8 beat run of vtach- currently running in 100s. Paged surg onc- O'Brien. Will continue to monitor.   Lawson Fiscal, RN  07/15/2014, 20:13

## 2014-07-15 NOTE — Nurses Notes (Signed)
Order to remove foley. Drainage bag emptied. NS removed from balloon. Foley catheter removed. Pt tolerated procedure well. Peri care provided. Instructed patient on need to void by 1715. Will continue to monitor.

## 2014-07-15 NOTE — Progress Notes (Addendum)
[facility]  Surgical Oncology Consult  Follow Up    Kenneth Weeks, Kenneth Weeks, 64 y.o. male  Date of Service: 07/15/2014  Date of Birth:  03-02-1950    Hospital Day:  LOS: 5 days     Subjective/Overnight Events:  Patient doing well. Had some nausea overnight associated with pain medicatio  + BM, + flatus. Has been OOB. Pain controlled     Objective:  Temperature: 36.7 C (98.1 F)  Heart Rate: 99  BP (Non-Invasive): 135/76 mmHg  Respiratory Rate: 16  SpO2-1: 96 %  Pain Score (Numeric, Faces): 6    General:  NAD.  HENT:  NTAC.  Lungs: Breathing unlabored.  Cardiovascular:  RR.  Abdomen:  S, NT, mild distention. Incision clean/dry/intact  Extremities:  No cyanosis.  Skin:  Skin warm and dry.  Neurologic:  AOx3.    Psychiatric:  Affect Normal.    Labs:    BMP:     Recent Labs      07/15/14   0423   SODIUM  133*   POTASSIUM  3.8   CHLORIDE  104   CO2  20*   BUN  6*   CREATININE  0.84   GLUCOSENF  123   ANIONGAP  9   BUNCRRATIO  7   GFR  >59   CALCIUM  7.9*     CBC Results Differential Results   Recent Labs      07/15/14   0423   WBC  7.1   HGB  8.9*   HCT  27.9*   PLTCNT  140    Recent Results (from the past 30 hour(s))   CBC/DIFF    Collection Time: 07/15/14  4:23 AM   Result Value    WBC 7.1    PMN'S 85    LYMPHOCYTES 6    MONOCYTES 7    EOSINOPHIL 1    BASOPHILS 1        Hepatic Function:    No results found for this encounter  Magnesium:     Recent Labs      07/15/14   0423   MAGNESIUM  1.8     Phosphorus:     Recent Labs      07/15/14   0423   PHOSPHORUS  3.2     Coags:    Recent Labs      07/15/14   0423  07/15/14   0630   PROTHROMTME  16.7*   --    INR  1.50*   --    APTT   --   70.9*       Imaging Studies:  07/11/2014 - CT A/P w/w/o contrast - [facility]  Findings: There is small bilateral pleural effusions with associated atelectasis. There is the 4 mm nodule within the right lower lobe series 5 image 2. Followup with CT chest in 6-12 months is recommended. There is an additional nodule at  approximately 2 mm on series 5 image 2 within the right upper lobe inferiorly.    There is cholelithiasis and mild gallbladder wall thickening. There is no biliary ductal dilatation. No evidence for hepatic mass on this single phase study. No specific evidence for sclerotic or fibrotic change. There is moderate atrophic change of the pancreas. Spleen is normal size. There are small porta hepatis lymph nodes which are likely reactive. There is no hydronephrosis or hydroureter.    There is no evidence for bowel obstruction. No evidence for significant bowel wall thickening in a patient with  known mass within the proximal right colon. The appendix is unremarkable. There is diverticulosis in the sigmoid colon without evidence of complicated disease.    There is mild mesenteric stranding and a minimal amount of free fluid within the pelvis. There is a rectal tube in place. There is a left inguinal hernia containing only fat. There are scattered small mesenteric and retroperitoneal lymph nodes without significant lymphadenopathy by size criteria.    There are degenerative changes in the spine without evidence for acute bony abnormality.    IMPRESSION:   1.Small bilateral pleural effusions. 2 pulmonary nodules, largest measuring 4 mm. Followup with CT chest in 6-12 months is recommended.  2. Cholelithiasis with mild gallbladder wall thickening. If there is concern for acute cholecystitis further evaluation with ultrasound is recommended.  3. Patient's known cecal mass is not visualized. No evidence of obstruction. Diverticulosis without evidence for complicated disease.  4. Mild nonspecific mesenteric stranding and minimal free fluid within the pelvis.    Assessment/Recommendations:  Patient is a 64 y/o M, admitted 07/10/2014; HD  LOS: 5 days  with Hx of Marfans, mechanical aortic valve (1999) (on anticoaggulation), aFib (s/p cardioversion), DM, hypothyroidism, who presented to [facility] w/ bloody diarrhea,  supratherapeutic INR and hypotension.  Admitted to MICU, INR reversed, resuscitated and started on heparin drip.  GI consulted and colonoscopy remarkable for large mass along the 1st fold of the ascending colon concerning for malignancy.  Surg Onc consulted for evaluation.  He has underwent:    1.  07/10/2014 - Colonoscopy w/ Bx  bx results: adenocarcinoma     2. laproscopic Right hemicolectomy with stapled anastomosis  Pathology pending     ALL:  NKDA  Meds:  Coreg, Lasix, synthroid, Mg, K, spironolactone, coumadin    PMHx:  As above  PSHx:  As above + T&A    VSS, aFEB, URINE OUTPUT appropriate    Labs reviewed  -  Hb stable @ 8.9  Glucose - under good control, no Hx of DM  1.  Lower GI bleed secondary to supratherapic INR aand ascending colon mass concerning for malignancy  POD 2 s/p laparoscopic right hemicolectomy, doing well, tolerating clears, plan to advance to full liquids today.     Hx of mechanical heart valve:  - currently on heparin gtt  - warfarin 5 mg tonight    Miscellaneous/Home Meds:  Resume home lasix    ABX:  As above  Diet/fluids:  Clear, advance to full liquids today  Activity:  As tolerated  Pain control:  Will convert to po oxycodone for pain control  GI/DVT prophylaxis:  As above  Flatus/last BM:  +/+  PT/OT ordered/dispo recs:  ordered  Lines:  07/11/2014 - PICC, DC foley and flexiseal   Dispo:  Continue floor status    Kenneth Moore, DO 07/15/2014, 07:46  Kenneth Rhymes, DO  PGY-2 General Surgery  Pager [phone]    I saw and examined the patient.  I reviewed the resident's note.  I agree with the findings and plan of care as documented in the resident's note.  Any exceptions/additions are edited/noted.    Full liquid diet    Increase activity/pulmonary toilet    Resume warfarin (5mg  po tonight)    Resume home lasix    Kenneth Passe, MD 07/15/2014, 08:03

## 2014-07-15 NOTE — Nurses Notes (Signed)
Heparin rate increased to 55mls/hr.  bolused with 3500.

## 2014-07-16 DIAGNOSIS — E876 Hypokalemia: Secondary | ICD-10-CM | POA: Insufficient documentation

## 2014-07-16 LAB — CBC/DIFF
BASOPHILS: 0 %
BASOS ABS: 0.023 THOU/uL (ref 0.000–0.200)
EOS ABS: 0.03 THOU/uL (ref 0.000–0.500)
EOSINOPHIL: 1 %
HCT: 28.2 % — ABNORMAL LOW (ref 36.7–47.0)
HGB: 9.1 g/dL — ABNORMAL LOW (ref 12.5–16.3)
LYMPHOCYTES: 8 %
LYMPHS ABS: 0.547 10*3/uL — ABNORMAL LOW (ref 1.000–4.800)
MCH: 26.6 pg — ABNORMAL LOW (ref 27.4–33.0)
MCHC: 32.2 g/dL — ABNORMAL LOW (ref 32.5–35.8)
MCV: 82.7 fL (ref 78–100)
MONOCYTES: 8 %
MONOS ABS: 0.503 THOU/uL (ref 0.300–1.000)
MPV: 8.5 fL (ref 7.5–11.5)
PLATELET COUNT: 170 10*3/uL (ref 140–450)
PMN ABS: 5.647 THOU/uL (ref 1.500–7.700)
PMN'S: 83 %
RBC: 3.41 MIL/uL — ABNORMAL LOW (ref 4.06–5.63)
RDW: 19.8 % — ABNORMAL HIGH (ref 12.0–15.0)
WBC: 6.8 10*3/uL (ref 3.5–11.0)

## 2014-07-16 LAB — BASIC METABOLIC PANEL
ANION GAP: 8 mmol/L (ref 4–13)
ANION GAP: 8 mmol/L (ref 4–13)
BUN/CREAT RATIO: 8 (ref 6–22)
BUN: 6 mg/dL — ABNORMAL LOW (ref 8–25)
CALCIUM: 8.5 mg/dL (ref 8.5–10.4)
CARBON DIOXIDE: 25 mmol/L (ref 22–32)
CHLORIDE: 100 mmol/L (ref 96–111)
CREATININE: 0.8 mg/dL (ref 0.62–1.27)
ESTIMATED GLOMERULAR FILTRATION RATE: >59 mL/min/{1.73_m2} (ref >59)
GLUCOSE,NONFAST: 121 mg/dL (ref 65–139)
POTASSIUM: 3.3 mmol/L — ABNORMAL LOW (ref 3.5–5.1)
SODIUM: 133 mmol/L — ABNORMAL LOW (ref 136–145)

## 2014-07-16 LAB — PERFORM POC WHOLE BLOOD GLUCOSE
GLUCOSE, POINT OF CARE: 128 mg/dL — ABNORMAL HIGH (ref 70–105)
GLUCOSE, POINT OF CARE: 133 mg/dL — ABNORMAL HIGH (ref 70–105)
GLUCOSE, POINT OF CARE: 129 mg/dL — ABNORMAL HIGH (ref 70–105)
GLUCOSE, POINT OF CARE: 126 mg/dL — ABNORMAL HIGH (ref 70–105)

## 2014-07-16 LAB — PT/INR
INR: 1.6 — ABNORMAL HIGH (ref 0.80–1.20)
PROTHROMBIN TIME: 17.8 s — ABNORMAL HIGH (ref 9.0–13.4)

## 2014-07-16 LAB — PTT (PARTIAL THROMBOPLASTIN TIME)
APTT: 61.5 s — ABNORMAL HIGH (ref 25.1–36.5)
APTT: 62 s — ABNORMAL HIGH (ref 25.1–36.5)
APTT: 55.1 s — ABNORMAL HIGH (ref 25.1–36.5)

## 2014-07-16 LAB — MAGNESIUM: MAGNESIUM: 1.7 mg/dL (ref 1.6–2.5)

## 2014-07-16 LAB — PHOSPHORUS: PHOSPHORUS: 3.4 mg/dL (ref 2.3–4.0)

## 2014-07-16 MED ORDER — WARFARIN 7.5 MG TABLET
7.50 mg | ORAL_TABLET | Freq: Every evening | ORAL | Status: DC
Start: 2014-07-16 — End: 2014-07-18
  Administered 2014-07-16 – 2014-07-17 (×2): 7.5 mg via ORAL
  Filled 2014-07-16 (×3): qty 1

## 2014-07-16 MED ORDER — MAGNESIUM SULFATE 4 GRAM/100 ML (4 %) IN WATER INTRAVENOUS PIGGYBACK
4.0000 g | INJECTION | Freq: Once | INTRAVENOUS | Status: AC
Start: 2014-07-16 — End: 2014-07-16
  Administered 2014-07-16: 4 g via INTRAVENOUS
  Filled 2014-07-16: qty 100

## 2014-07-16 MED ORDER — POTASSIUM CHLORIDE ER 20 MEQ TABLET,EXTENDED RELEASE(PART/CRYST)
60.0000 meq | ORAL_TABLET | Freq: Once | ORAL | Status: AC
Start: 2014-07-16 — End: 2014-07-16
  Administered 2014-07-16: 60 meq via ORAL
  Filled 2014-07-16: qty 3

## 2014-07-16 MED ORDER — HEPARIN (PORCINE) 5,000 UNITS/ML BOLUS FOR DOSE ADJUSTMENT
3500.0000 [IU] | Freq: Once | INTRAMUSCULAR | Status: AC
Start: 2014-07-16 — End: 2014-07-16
  Administered 2014-07-16: 3500 [IU] via INTRAVENOUS
  Filled 2014-07-16: qty 1

## 2014-07-16 MED ORDER — ACETAMINOPHEN 325 MG TABLET
650.0000 mg | ORAL_TABLET | ORAL | Status: DC | PRN
Start: 2014-07-16 — End: 2014-07-18

## 2014-07-16 MED ORDER — ONDANSETRON HCL (PF) 4 MG/2 ML INJECTION SOLUTION
4.00 mg | Freq: Three times a day (TID) | INTRAMUSCULAR | Status: DC
Start: 2014-07-16 — End: 2014-07-18
  Administered 2014-07-16 – 2014-07-17 (×4): 4 mg via INTRAVENOUS
  Administered 2014-07-17 – 2014-07-18 (×2): 0 mg via INTRAVENOUS
  Filled 2014-07-16 (×5): qty 2

## 2014-07-16 MED ORDER — ONDANSETRON HCL (PF) 4 MG/2 ML INJECTION SOLUTION
4.00 mg | INTRAMUSCULAR | Status: AC
Start: 2014-07-16 — End: 2014-07-16
  Administered 2014-07-16: 4 mg via INTRAVENOUS
  Filled 2014-07-16: qty 2

## 2014-07-16 MED ADMIN — heparin (porcine) 25,000 unit/250 mL (100 unit/mL) in dextrose 5 % IV: INTRAVENOUS | @ 13:00:00

## 2014-07-16 MED ADMIN — dextrose 5 % and 0.45 % sodium chloride intravenous solution: INTRAVENOUS | @ 19:00:00 | NDC 00338008504

## 2014-07-16 MED ADMIN — vancomycin 1 gram/200 mL in dextrose 5 % intravenous piggyback: @ 06:00:00

## 2014-07-16 MED ADMIN — lisinopriL 5 mg tablet: ORAL | @ 17:00:00

## 2014-07-16 MED ADMIN — sodium chloride 0.9 % (flush) injection syringe: INTRAVENOUS | @ 14:00:00

## 2014-07-16 MED ADMIN — lactated Ringers intravenous solution: INTRAVENOUS | @ 18:00:00 | NDC 00338011704

## 2014-07-16 NOTE — Care Plan (Signed)
Problem: General Plan of Care(Adult,OB)  Goal: Plan of Care Review(Adult,OB)  The patient and/or their representative will communicate an understanding of their plan of care   Outcome: Ongoing (see interventions/notes)  Per touch rounds this am, anticipated DC date is within the next few days w/ no needs at his time.  Pt is a s/p Rt hemicolectomy w/ a hx of a prosthetic valve that was on coumadin at home.  Pt must be therapeutic before DC.  The patient will continue to be evaluated for developing discharge needs.

## 2014-07-16 NOTE — Care Plan (Signed)
Problem: General Plan of Care(Adult,OB)  Goal: Plan of Care Review(Adult,OB)  The patient and/or their representative will communicate an understanding of their plan of care   Outcome: Ongoing (see interventions/notes)  Pt had good tolerance to tx. Pt completed functional mobility of ~15' x2 with FWW and cg(a)X1 within room.         Discharge Needs (per PT note 6/3):   Equipment Recommendations: Front Wheeled Gilford Rile The patient presents with mobility limitations due to impaired balance, impaired strength and impaired functional activity tolerance that significantly impair/prevent patients ability to participate in mobility-related activities of daily living (MRADLs) including  ambulation and transfers in order to safely complete, toileting, bathing, safely entering/exiting the home, in reasonable time. This functional mobility deficit can be sufficiently resolved with the use of a  front wheel walker in order to decrease the risk of falls, morbidity, and mortality in performance of these MRADLs.  Patient is able to safely use this assistive device.  Further Treatment Recommendations: anticipate home to improve functional strength, balance, endurance, and mobility.  Linde Gillis, PTA  Pager # [phone]

## 2014-07-16 NOTE — Nurses Notes (Signed)
PTT 45.8. Administered 7000 unit bolus + increased rate by 3= new rate 2000 units/ hr.   Next PTT due at 5 am. Will continue to monitor.   Lawson Fiscal, RN  07/16/2014, 00:15

## 2014-07-16 NOTE — Nurses Notes (Signed)
PTT 61.5. No rate change. Currently running 2000 units/hr.   Next PTT due at 1300. Will continue to monitor.   Lawson Fiscal, RN  07/16/2014, 06:55

## 2014-07-16 NOTE — Nurses Notes (Signed)
FS 129, no coverage required.

## 2014-07-16 NOTE — Progress Notes (Addendum)
[facility]  Surgical Oncology Consult  Follow Up    Kenneth Weeks, Kenneth Weeks, 64 y.o. male  Date of Service: 07/16/2014  Date of Birth:  05-Nov-1950    Hospital Day:  LOS: 6 days     Subjective/Overnight Events:  Patient doing fair. States that he had a lot of nausea overnight and that the Zofran didn't really help. He has been up OOB, pain is controlled. + flatus no BM     Objective:  Temperature: 36.8 C (98.2 F)  Heart Rate: 79  BP (Non-Invasive): 132/82 mmHg  Respiratory Rate: 16  SpO2-1: 95 %  Pain Score (Numeric, Faces): 3    General:  NAD.  HENT:  NTAC.  Lungs: Breathing unlabored.  Cardiovascular:  RR.  Abdomen:  S, NT, mild distention. Incision clean/dry/intact  Extremities:  No cyanosis.  Skin:  Skin warm and dry.  Neurologic:  AOx3.    Psychiatric:  Affect Normal.    Labs:    BMP:     Recent Labs      07/16/14   0530   SODIUM  133*   POTASSIUM  3.3*   CHLORIDE  100   CO2  25   BUN  6*   CREATININE  0.80   GLUCOSENF  121   ANIONGAP  8   BUNCRRATIO  8   GFR  >59   CALCIUM  8.5     CBC Results Differential Results   Recent Labs      07/16/14   0530   WBC  6.8   HGB  9.1*   HCT  28.2*   PLTCNT  170    Recent Results (from the past 30 hour(s))   CBC/DIFF    Collection Time: 07/16/14  5:30 AM   Result Value    WBC 6.8    PMN'S 83    LYMPHOCYTES 8    MONOCYTES 8    EOSINOPHIL 1    BASOPHILS 0        Hepatic Function:    No results found for this encounter  Magnesium:     Recent Labs      07/16/14   0530   MAGNESIUM  1.7     Phosphorus:     Recent Labs      07/16/14   0530   PHOSPHORUS  3.4     Coags:    Recent Labs      07/16/14   0530   PROTHROMTME  17.8*   INR  1.60*   APTT  61.5*       Imaging Studies:  07/11/2014 - CT A/P w/w/o contrast - [place]  Findings: There is small bilateral pleural effusions with associated atelectasis. There is the 4 mm nodule within the right lower lobe series 5 image 2. Followup with CT chest in 6-12 months is recommended. There is an additional nodule at  approximately 2 mm on series 5 image 2 within the right upper lobe inferiorly.    There is cholelithiasis and mild gallbladder wall thickening. There is no biliary ductal dilatation. No evidence for hepatic mass on this single phase study. No specific evidence for sclerotic or fibrotic change. There is moderate atrophic change of the pancreas. Spleen is normal size. There are small porta hepatis lymph nodes which are likely reactive. There is no hydronephrosis or hydroureter.    There is no evidence for bowel obstruction. No evidence for significant bowel wall thickening in a patient with known mass within the proximal right colon.  The appendix is unremarkable. There is diverticulosis in the sigmoid colon without evidence of complicated disease.    There is mild mesenteric stranding and a minimal amount of free fluid within the pelvis. There is a rectal tube in place. There is a left inguinal hernia containing only fat. There are scattered small mesenteric and retroperitoneal lymph nodes without significant lymphadenopathy by size criteria.    There are degenerative changes in the spine without evidence for acute bony abnormality.    IMPRESSION:   1.Small bilateral pleural effusions. 2 pulmonary nodules, largest measuring 4 mm. Followup with CT chest in 6-12 months is recommended.  2. Cholelithiasis with mild gallbladder wall thickening. If there is concern for acute cholecystitis further evaluation with ultrasound is recommended.  3. Patient's known cecal mass is not visualized. No evidence of obstruction. Diverticulosis without evidence for complicated disease.  4. Mild nonspecific mesenteric stranding and minimal free fluid within the pelvis.    Assessment/Recommendations:  Patient is a Kenneth Weeks, admitted 07/10/2014; HD  LOS: 6 days  with Hx of Marfans, mechanical aortic valve (1999) (on anticoaggulation), aFib (s/p cardioversion), DM, hypothyroidism, who presented to [facility] w/ bloody diarrhea,  supratherapeutic INR and hypotension.  Admitted to MICU, INR reversed, resuscitated and started on heparin drip.  GI consulted and colonoscopy remarkable for large mass along the 1st fold of the ascending colon concerning for malignancy.  Surg Onc consulted for evaluation.  He has underwent:    1.  07/10/2014 - Colonoscopy w/ Bx  bx results: adenocarcinoma     2. laproscopic Right hemicolectomy with stapled anastomosis  Final Pathology pending  As of now    ALL:  NKDA  Meds:  Coreg, Lasix, synthroid, Mg, K, spironolactone, coumadin    PMHx:  As above  PSHx:  As above + T&A    VSS, aFEB, URINE OUTPUT appropriate    Labs reviewed  -  Hb stable @ 8.9  Glucose - under good control, no Hx of DM  1.  Lower GI bleed secondary to supratherapic INR aand ascending colon mass concerning for malignancy  POD 3 s/p right hemi colectomy, having some nausea issues, will schedule Zofran, continue full liquid diet     Hx of mechanical heart valve:  - currently on heparin gtt  - warfarin 5 mg tonight, again based on nomogram calculation. INR currently 1.6    Miscellaneous/Home Meds:  Lasix and spironolactone resumed    ABX:  As above  Diet/fluids: fulls  Activity:  As tolerated  Pain control:  Continue oxycodone, tylenol po added today   GI/DVT prophylaxis:  As above  Flatus/last BM:  +/+  PT/OT ordered/dispo recs:  ordered  Lines:  07/11/2014 - PICC  Dispo:  Continue floor status    Tad Moore, DO 07/16/2014, 06:57  Rickard Rhymes, DO  PGY-2 General Surgery  Pager [phone]      I saw and examined the patient.  I reviewed the resident's note.  I agree with the findings and plan of care as documented in the resident's note.  Any exceptions/additions are edited/noted.    Gwinda Passe, MD 07/16/2014, 10:17

## 2014-07-16 NOTE — Nurses Notes (Signed)
FS 133, no coverage required.

## 2014-07-16 NOTE — Nurses Notes (Signed)
PTT 55.1. Per protocol- 3500 bolus and increase rate +1  = new rate 2100. Next PTT due at 4 am. Will continue to monitor.   Lawson Fiscal, RN  07/16/2014, 22:38

## 2014-07-16 NOTE — Nurses Notes (Signed)
1006. Pt medicated with 4 mg Zofran IVP for nausea and emesis.  1100: Nausea relieved.

## 2014-07-16 NOTE — PT Treatment (Signed)
[facility]  Rehabilitation Services  Physical Therapy Progress Note    Patient Name: Kenneth Weeks  Date of Birth: [birth year]  Room/Bed: 779/B  Date/Time of Admission: 07/10/2014  4:09 PM  Admitting Diagnosis:  GI bleed [K92.2]    Subjective:   Reviewed chart prior to tx. RN OK"ed tx. Pt pleasant and agreeable to tx.   Objective:   Pt seen with OT this AM. Pt supine in bed with wife present upon arrival. Pt transferred tO EOB with HOB elevated, bed rail assist, and (S). Pt then stood @ FWW with cg(a)X2 and cueing for hand placement. Pt then completed functional mobility of ~15' x2 to and from [facility] with FWW and cg(A)x1. See OT note for commode transfer. Pt then returned to chair and sat with cueing and cg(a)X1. Reviewed BLE exercises to complete throughout the day and pt able to demonstrate understanding. Spoke to pt importance of OOB during meal times and mobility with nursing staff. Pt had no further needs @ this time.     Status at end of PT tx:  Positioning: seated in chair with waffle cushion  Call bell in reach: yes  Phone in reach: no, declined  Mobility alarm: n/a  Veno dynes: no, encouraged ap's  Whiteboard updated: yes    Assessment:   Pt had good tolerance to tx. Pt completed functional mobility of ~15' x2 with FWW and cg(a)X1 within room.       Discharge Needs (per PT note 6/3):   Equipment Recommendations: Front Wheeled Gilford Rile The patient presents with mobility limitations due to impaired balance, impaired strength and impaired functional activity tolerance that significantly impair/prevent patients ability to participate in mobility-related activities of daily living (MRADLs) including  ambulation and transfers in order to safely complete, toileting, bathing, safely entering/exiting the home, in reasonable time. This functional mobility deficit can be sufficiently resolved with the use of a  front wheel walker in order to decrease the risk of falls, morbidity, and mortality in  performance of these MRADLs.  Patient is able to safely use this assistive device.  Further Treatment Recommendations: anticipate home to improve functional strength, balance, endurance, and mobility.    Plan:   Continue to follow patient according to established plan of care.    The risks/benefits of therapy have been discussed with the patient/caregiver and he/she is in agreement with the established plan of care.     Therapist:   Willis Modena, PTA 07/16/2014 15:53  Pager #: [phone]  Treatment Time: 28 minutes

## 2014-07-16 NOTE — OT Evaluation (Signed)
[facility]                                                            Occupational Therapy Initial Evaluation    Recent Diagnosis/Procedures: Kenneth Weeks is a 64 y.o. male  with history of hypertension, Marfan's syndrome requiring mechanical aortic valve replacement, chronic congestive heart failure and atrial fibrillation, and hypothyroidism that presents as a transfer from [facility] for an acute GI bleed. On Sunday (2 days ago), patient was having bloody diarrhea and generalized weakness with 5-6 episodes of blood and clots. He then started having syncopal episodes and was found to be hypotensive by his wife leading him to go to the [facility] emergency department. There, he was noted to have an H/H of 6.3/21.1 and INR of 10.27. He does take Warfarin at home for his chronic atrial fibrillation. He was given Vitamin K and 2 units of FFP for his supra-therapeutic INR. He was started on octreotide and Protonix infusions. The following morning, his INR was improved and he underwent an endoscopy with no findings. Octreotide infusion was discontinued and he was to have a colonoscopy. GI also recommended further work up for likely liver cirrhosis leading to bleeding despite having normal liver enzymes because CT read was indicative of cirrhosis. Liver work up included a normal lipid panel, slightly elevated bilirubin at 2.1, normal AST/ALT/Alk Phos, normal ammonia, negative hepatitis serologies. Stool culture and C. Diff were negative as well. Patient was prepped for colonoscopy; however, it was then decided that he would need arteriogram, so he was transferred to [place] for further care. At outside facility, the patient received 5 units pRBCs  and 1 unit FFP. OT is consulted for evaluation, treatment, and assessment of home safety/post discharge rehabilitation need.  Precautions: fall risk    Past Medical History   Diagnosis Date    Marfan syndrome     Congestive heart failure     Diabetes mellitus      type ii diet controlled    Wears glasses     Thyroid disease      hypothyroid    Reflux     Hydrocele     Hemorrhage of gastrointestinal tract 5/16    A-fib      Chronic anti-coagulant therapy    Transfusion history      s/p Multiple units PRC/FFP this adm.    Syncope     Hemorrhagic shock     Acute on chronic renal failure     Muscle weakness      Generalized    Hypertension 07/10/2014         Past Surgical History   Procedure Laterality Date    Hx heart valve surgery  1999     [facility]     Hx upper endoscopy  07/09/14    Hx tonsil and adenoidectomy           History  Social History Narrative       SUBJECTIVE:   Order received, chart reviewed and pt seen at bedside with permission of bedside nurse.     Equipment:   IV    Flowsheet Info:     07/16/14 1200   Therapist Pager   OT Assigned/ Pager # [phone]   General Information   Patient Profile Review yes   Mutuality/Individual Preferences   Patient Specific Preferences call me rich    Patient Specific Goals to go home   Patient Specific Interventions bed mobility safety transfers self care mobility   Mutuality Comment receptive to Ot intervention   Living Environment   Lives With spouse   Living Arrangements house   Home Assessment: Stairs in [place] Accessibility stairs to enter home   Number of Stairs to [place] 1   Number of [place] BR on second floor   Functional Level Prior   Ambulation 0-->independent   Transferring 0-->independent   Toileting 0-->independent   Bathing 0-->independent   Dressing 0-->independent   Prior Functional Level Comment Pts wife reports pt was fine until the bleeding started   Pain Assessment   Pain Comment  (Pre/Post Treatment Pain) Pt reports burning sensaton in chest musculature when using RUE to A with standing   Coping/Psychosocial Response   Observed Emotional State calm;cooperative   Verbalized Emotional State acceptance   Coping/Psychosocial Response Interventions   Plan Of Care Reviewed With patient;spouse   Cognitive Status Examination   Orientation orientation to person, place and time   Level of Consciousness alert   Able to Follow Commands WNL/WFL   Personal Safety (Cognitive) WNL/WFL   Short Term Memory intact   Long Term Memory intact   Range Of Motion (ROM)   ROM Comments BUE AROM WFL   Grip Strength   Grip Left (4/5) good, left   Right Grip (4/5) good, right   BADL Evaluation   BADL Comments Will further assess donning socks, dependent this date,  Pt required modA with post toilet hygiene.     IADL Evaluation   IADL Comments currenlty pt would be dependent for meal prep and houshold tasks   Clinical Impression   Functional Level at Time of Session Pt tolerated Ot eval well.  Pt is motivated to improve.  Wife present and supportive.  Pt presnts with a decrease in fucntional status due to decreased overall endurance, decreased balance, decreased overall strength.  Pt requires A with mobility and transfers and self care.  Pt would benfit from further Ot intervention while in acute care setting and will be seen by Ot.  When pt is medically stable, pt should be able to return home with intermittent A from family.  Pt would beneioft from [place] upon DC.     Criteria for Skilled Therapeutic Interventions Met yes;treatment indicated   Rehab Potential good, to achieve stated therapy goals   Therapy Frequency 1x/day;minimum of 2x/week   Predicted Duration of Therapy Intervention (days/wks) until DC from facility   Anticipated Equipment Needs at Discharge front wheeled walker   Anticipated Discharge Disposition home with assist       PLAN   ADL retraining, IADL retraining, functional mobility/transfer training,  energy conservation/endurance training, therapeutic exercise, home safety training, patient/family education and further goals assessed with patient progress    Barriers to Discharge: none with A   D/C Recommendations/Needs:home with A     Equipment -  Patient presents  with mobility limitations as documented that significantly impair/prevent patients ability to participate in mobility-related activities of daily living (MRADLs) including but not limited to ambulation and transfers in order to safely complete toileting/bathing/food prep/household tasks/home entry and exit.     This functional mobility deficit can be sufficiently resolved with the use of a FWW.     This assistive device is required in order to decrease the risk of falls, morbidity, and mortality in performance of these MRADLs.  Patient is able to safely use this assistive device     Occupational Therapy Goals:  By DC pt   Will be able to complete bed mobility Ily  Will be able to complete transfers to bed, commode and chair Ily   will be able to complete self care tasks following set up  Will demonstrate good dynamic standing balance ot complete self care tasks  Will be able to complete functional mobility with A device    Dixon Boos OTR/L  Therapist beeper #  [phone]    Evaluation Time:   40 minutes    Time may include review of medical chart, obtaining patient's functional history from patient/family/medical staff/case management/ancillary personnel, collaboration on findings and treatment options (with the above mentioned individuals), re-assessment, documentation and acute care rehabilitation.

## 2014-07-16 NOTE — Nurses Notes (Signed)
Ptt 62 No change in Heparin drip. Continues at 9ml.hr.

## 2014-07-16 NOTE — Care Management Notes (Signed)
[facility]  Care Management Note    Patient Name: Kenneth Weeks  Date of Birth: 06-14-1950  Sex: male  Date/Time of Admission: 07/10/2014  4:09 PM  Room/Bed: 779/B  Payor: MEDICARE / Plan: MEDICARE PART A AND B / Product Type: Medicare /    LOS: 6 days   PCP: Viona Gilmore, MD    Admitting Diagnosis:  GI bleed [K92.2]    Assessment:      07/16/14 1300   Assessment Details   Assessment Type Continued Assessment   Date of Care Management Update 07/16/14   Date of Next DCP Update 07/18/14   Care Management Plan   Discharge Planning Status plan in progress   Projected Discharge Date 07/20/14       Discharge Plan:  Home vs home with Home Health  Per touch rounds this am, anticipated DC date is within the next few days w/ no needs at his time.  Pt is a s/p Rt hemicolectomy w/ a hx of a prosthetic valve that was on coumadin at home.  Pt must be therapeutic before DC.  The patient will continue to be evaluated for developing discharge needs.     Case Manager: Terri Piedra, CLINICAL CAR 07/16/2014, 13:45  Phone: [phone]

## 2014-07-16 NOTE — Care Plan (Signed)
Problem: General Plan of Care(Adult,OB)  Goal: Plan of Care Review(Adult,OB)  The patient and/or their representative will communicate an understanding of their plan of care   Outcome: Ongoing (see interventions/notes)  Functional Level at Time of Session Pt tolerated Ot eval well.  Pt is motivated to improve.  Wife present and supportive.  Pt presnts with a decrease in fucntional status due to decreased overall endurance, decreased balance, decreased overall strength.  Pt requires A with mobility and transfers and self care.  Pt would benfit from further Ot intervention while in acute care setting and will be seen by Ot.  When pt is medically stable, pt should be able to return home with intermittent A from family.  Pt would beneioft from [place] upon DC.     Criteria for Skilled Therapeutic Interventions Met yes;treatment indicated   Rehab Potential good, to achieve stated therapy goals   Therapy Frequency 1x/day;minimum of 2x/week   Predicted Duration of Therapy Intervention (days/wks) until DC from facility   Anticipated Equipment Needs at Discharge front wheeled walker   Anticipated Discharge Disposition home with assist         PLAN   ADL retraining, IADL retraining, functional mobility/transfer training, energy conservation/endurance training, therapeutic exercise, home safety training, patient/family education and further goals assessed with patient progress    Barriers to Discharge: none with A   D/C Recommendations/Needs:home with A     Equipment -  Patient presents with mobility limitations as documented that significantly impair/prevent patients ability to participate in mobility-related activities of daily living (MRADLs) including but not limited to ambulation and transfers in order to safely complete toileting/bathing/food prep/household tasks/home entry and exit.     This functional mobility deficit can be sufficiently resolved with the use of a FWW.     This assistive device is required in order to  decrease the risk of falls, morbidity, and mortality in performance of these MRADLs.  Patient is able to safely use this assistive device     Occupational Therapy Goals:  By DC pt   Will be able to complete bed mobility Ily  Will be able to complete transfers to bed, commode and chair Ily   will be able to complete self care tasks following set up  Will demonstrate good dynamic standing balance ot complete self care tasks  Will be able to complete functional mobility with A device    Dixon Boos OTR/L  Therapist beeper #  [phone]

## 2014-07-17 LAB — PTT (PARTIAL THROMBOPLASTIN TIME)
APTT: 100.9 s — ABNORMAL HIGH (ref 25.1–36.5)
APTT: 100.9 s — ABNORMAL HIGH (ref 25.1–36.5)
APTT: 82.9 s — ABNORMAL HIGH (ref 25.1–36.5)
APTT: 82.9 s — ABNORMAL HIGH (ref 25.1–36.5)
APTT: 56.3 s — ABNORMAL HIGH (ref 25.1–36.5)
APTT: 101.5 s — ABNORMAL HIGH (ref 25.1–36.5)

## 2014-07-17 LAB — CBC/DIFF
BASOPHILS: 1 %
BASOS ABS: 0.037 THOU/uL (ref 0.000–0.200)
EOS ABS: 0.045 THOU/uL (ref 0.000–0.500)
EOSINOPHIL: 1 %
HCT: 28.1 % — ABNORMAL LOW (ref 36.7–47.0)
HGB: 9 g/dL — ABNORMAL LOW (ref 12.5–16.3)
LYMPHOCYTES: 15 %
LYMPHS ABS: 0.938 10*3/uL — ABNORMAL LOW (ref 1.000–4.800)
LYMPHS ABS: 0.938 THOU/uL — ABNORMAL LOW (ref 1.000–4.800)
MCH: 26.3 pg — ABNORMAL LOW (ref 27.4–33.0)
MCHC: 32.2 g/dL — ABNORMAL LOW (ref 32.5–35.8)
MCV: 81.6 fL (ref 78–100)
MONOCYTES: 8 %
MONOS ABS: 0.52 10*3/uL (ref 0.300–1.000)
MPV: 8.1 fL (ref 7.5–11.5)
PLATELET COUNT: 201 10*3/uL (ref 140–450)
PMN ABS: 4.742 THOU/uL (ref 1.500–7.700)
PMN'S: 75 %
RBC: 3.44 MIL/uL — ABNORMAL LOW (ref 4.06–5.63)
RDW: 19.4 % — ABNORMAL HIGH (ref 12.0–15.0)
WBC: 6.3 10*3/uL (ref 3.5–11.0)

## 2014-07-17 LAB — PT/INR
INR: 2.03 — ABNORMAL HIGH (ref 0.80–1.20)
INR: 2.07 — ABNORMAL HIGH (ref 0.80–1.20)
PROTHROMBIN TIME: 22.6 s — ABNORMAL HIGH (ref 9.0–13.4)
PROTHROMBIN TIME: 23 s — ABNORMAL HIGH (ref 9.0–13.4)

## 2014-07-17 LAB — LIVER FIBROSIS, FIBROTEST PNL
ALPHA-2-MACROGLOBULIN: 162 mg/dL (ref 106–279)
ALT (SGPT): 11 U/L (ref 9–46)
APOLIPOPROTEIN A1: 90 mg/dL — AB (ref 94–176)
FIBROSIS SCORE: 0.75
GGT: 19 U/L (ref 3–70)
NECROINFLAMMAT ACT SCORE: 0.06
REFERENCE ID: 1231272
TOTAL BILIRUBIN: 1.1 mg/dL (ref 0.2–1.2)

## 2014-07-17 LAB — ALPHA-1-ANTITRYPSIN: ALPHA-1-ANTITRYPSIN, SERUM: 148 mg/dL (ref 100–190)

## 2014-07-17 LAB — MITOCHONDRIAL ANTIBODIES (M2), SERUM
MITOCHONDRIAL ANTIBODIES, QUALITATIVE: NEGATIVE
MITOCHONDRIAL ANTIBODIES, QUANTITATIVE: 0.7 (ref <0.91)

## 2014-07-17 LAB — BASIC METABOLIC PANEL
ANION GAP: 12 mmol/L (ref 4–13)
BUN/CREAT RATIO: 8 (ref 6–22)
BUN: 8 mg/dL (ref 8–25)
CALCIUM: 8.2 mg/dL — ABNORMAL LOW (ref 8.5–10.4)
CARBON DIOXIDE: 27 mmol/L (ref 22–32)
CHLORIDE: 96 mmol/L (ref 96–111)
CREATININE: 0.99 mg/dL (ref 0.62–1.27)
ESTIMATED GLOMERULAR FILTRATION RATE: >59 mL/min/{1.73_m2} (ref >59)
GLUCOSE,NONFAST: 114 mg/dL (ref 65–139)
POTASSIUM: 3.1 mmol/L — ABNORMAL LOW (ref 3.5–5.1)
SODIUM: 135 mmol/L — ABNORMAL LOW (ref 136–145)

## 2014-07-17 LAB — PERFORM POC WHOLE BLOOD GLUCOSE
GLUCOSE, POINT OF CARE: 110 mg/dL — ABNORMAL HIGH (ref 70–105)
GLUCOSE, POINT OF CARE: 139 mg/dL — ABNORMAL HIGH (ref 70–105)
GLUCOSE, POINT OF CARE: 106 mg/dL — ABNORMAL HIGH (ref 70–105)
GLUCOSE, POINT OF CARE: 128 mg/dL — ABNORMAL HIGH (ref 70–105)

## 2014-07-17 LAB — LIVER/KIDNEY MICROSOME (LKM) TYPE 1 ANTIBODIES, SERUM: LIVER/KIDNEY MICROSOME (LKM) TYPE 1 ANTIBODIES, SERUM: <5 U

## 2014-07-17 LAB — MAGNESIUM: MAGNESIUM: 1.4 mg/dL — ABNORMAL LOW (ref 1.6–2.5)

## 2014-07-17 LAB — CERULOPLASMIN: CERULOPLASMIN: 21 mg/dL

## 2014-07-17 LAB — SMOOTH MUSCLE ANTIBODIES, SERUM: ANTI-SMOOTH MUSCLE AB: NEGATIVE

## 2014-07-17 LAB — COPPER, SERUM: COPPER, SERUM: 1.01 ug/mL (ref 0.75–1.45)

## 2014-07-17 LAB — PHOSPHORUS: PHOSPHORUS: 3.5 mg/dL (ref 2.3–4.0)

## 2014-07-17 LAB — SOLUBLE LIVER ANTIGEN - BMC/JMC ONLY: SOLUBLE LIVER ANTIGEN: <20.1 U (ref 0.0–20.0)

## 2014-07-17 MED ORDER — SENNOSIDES 8.6 MG-DOCUSATE SODIUM 50 MG TABLET
1.0000 | ORAL_TABLET | Freq: Two times a day (BID) | ORAL | Status: DC
Start: 2014-07-17 — End: 2014-07-18
  Administered 2014-07-17 – 2014-07-18 (×3): 1 via ORAL
  Filled 2014-07-17 (×4): qty 1

## 2014-07-17 MED ORDER — POTASSIUM CHLORIDE ER 20 MEQ TABLET,EXTENDED RELEASE(PART/CRYST)
40.0000 meq | ORAL_TABLET | Freq: Once | ORAL | Status: AC
Start: 2014-07-17 — End: 2014-07-17
  Administered 2014-07-17: 40 meq via ORAL
  Filled 2014-07-17: qty 2

## 2014-07-17 MED ORDER — DOCUSATE SODIUM 100 MG CAPSULE
100.0000 mg | ORAL_CAPSULE | Freq: Two times a day (BID) | ORAL | Status: DC
Start: 2014-07-17 — End: 2014-07-18
  Administered 2014-07-17 – 2014-07-18 (×3): 100 mg via ORAL
  Filled 2014-07-17 (×4): qty 1

## 2014-07-17 MED ORDER — MAGNESIUM OXIDE 400 MG (241.3 MG MAGNESIUM) TABLET
400.0000 mg | ORAL_TABLET | Freq: Once | ORAL | Status: AC
Start: 2014-07-17 — End: 2014-07-17
  Administered 2014-07-17: 400 mg via ORAL
  Filled 2014-07-17: qty 1

## 2014-07-17 MED ORDER — METOCLOPRAMIDE 10 MG TABLET
10.00 mg | ORAL_TABLET | Freq: Three times a day (TID) | ORAL | Status: DC
Start: 2014-07-17 — End: 2014-07-18
  Administered 2014-07-17 – 2014-07-18 (×3): 10 mg via ORAL
  Filled 2014-07-17 (×5): qty 1

## 2014-07-17 MED ORDER — HEPARIN (PORCINE) 5,000 UNITS/ML BOLUS FOR DOSE ADJUSTMENT
3500.0000 [IU] | Freq: Once | INTRAMUSCULAR | Status: AC
Start: 2014-07-17 — End: 2014-07-17
  Administered 2014-07-17: 3500 [IU] via INTRAVENOUS
  Filled 2014-07-17: qty 1

## 2014-07-17 MED ADMIN — sodium chloride 0.9 % (flush) injection syringe: @ 06:00:00

## 2014-07-17 MED ADMIN — nystatin 100,000 unit/gram topical powder: ORAL | @ 20:00:00 | NDC 00574200815

## 2014-07-17 MED ADMIN — potassium chloride 20 mEq/L in dextrose 5 %-0.45 % sodium chloride IV: ORAL | @ 19:00:00 | NDC 00338067104

## 2014-07-17 MED ADMIN — simvastatin 20 mg tablet: ORAL | @ 20:00:00

## 2014-07-17 NOTE — OT Treatment (Signed)
[facility]  Occupational Therapy Progress Note    Patient Name: Kenneth Weeks  Date of Birth: 10/28/1950  Height:  193 cm (6' 3.98")  Weight:  (!) 139.3 kg (307 lb 1.6 oz)  Room/Bed: 779/B  Payor: MEDICARE / Plan: MEDICARE PART A AND B / Product Type: Medicare /     Date/Time of Admission: 07/10/2014  4:09 PM  Admitting Diagnosis:  GI bleed [K92.2]      Subjective:     Pt is reports feeling better this date.  Pt completed functional mobility with less effort and increased control with activity      Objective:   Precautions:  Fall risk   Pt was in recliner chair  upon entering the room.  RN approved session.  Patient agreeable to OT treatment. Treatment session completed with professional assistance of Physical Therapist. Pt completed STS from recliner chair with SBA.  Pt achieved good standing balance with support of FWW.  Pt completed functional mobility with use of WW to bathroom.  BSC placed over commode, pt completed STS from commode with CG.  Pt had some difficulty getting self positioned on commode  With BSC over commode, does better with just commode.  Pt able to complete light self care task in standing at sink level with good dynamic standing balance.  Pt completed functional mobility about 200' with FWW and CG.    At end of session pt sitting in recliner chair call button in reach and all expressed  needs met.      Assessment:   Pt tolerated OT treatment session much better.  Pt is continuing to show progress with self care and mobility.  When pt is medically stable, pt should be able to return home with intermittent A.          Discharge Needs:   Equipment Recommendations: The patient presents with mobility limitations due to impaired balance, impaired strength and impaired functional activity tolerance that significantly impair/prevent patients ability to participate in mobility-related activities of daily living (MRADLs) including  ambulation and  transfers in order to safely complete, toileting, safely entering/exiting the home, in reasonable time. This functional mobility deficit can be sufficiently resolved with the use of a  front wheel walker in order to decrease the risk of falls, morbidity, and mortality in performance of these MRADLs.  Patient is able to safely use this assistive device.    Further Treatment Recommendations: Home      Continue to follow according to established plan of care.    The risks/benefits of therapy have been discussed with the patient/caregiver and he/she is in agreement with the established plan of care.     Therapist:   Geanie Logan, OT 07/17/2014 13:52  Pager #: [phone]  Treatment Time:   28 minutes

## 2014-07-17 NOTE — Nurses Notes (Signed)
Results for PER, BEAGLEY (MRN [id number]) as of 07/17/2014 05:09   Ref. Range 07/17/2014 04:46   INR Latest Ref Range: 0.80-1.20  2.07 (H)   Txt paged surg onc- Dr. Stann Mainland. Will continue to monitor.   Lawson Fiscal, RN  07/17/2014, 05:10

## 2014-07-17 NOTE — PT Treatment (Signed)
[facility]  Rehabilitation Services  Physical Therapy Progress Note    Patient Name: Kenneth Weeks  Date of Birth: [birth year]  Room/Bed: 779/B  Date/Time of Admission: 07/10/2014  4:09 PM  Admitting Diagnosis:  GI bleed [K92.2]    Subjective:   RN OK"ed tx. Pt pleasant and agreeable to tx. Pt reports he is "feeling better today."   Objective:   Pt seen with OT this AM. Pt seated in chair upon arrival. Pt stood @ FWW with close (S). Pt then stated he needed to use BR. Pt completed functional mobility of ~10' x2 to and from [facility] with FWW and cg(a)-sb(a)X1. See OT note for transfer and BR need. Pt then completed functional mobility of ~200' with FWW and cg(a)-sb(a)X1. Pt returned to room and stood @ sin,k to wash hands. Pt then sat in chair with sb(a)X1 and proper hand placement. Pt had no further needs @ this time.     Status at end of PT tx:  Positioning: in chair with legs elevated  Call bell in reach: yes  Phone in reach: yes  Mobility alarm: n/a  Veno dynes: no  Whiteboard updated: yes    Assessment:   Pt had good tolerance to tx. Pt completed functional mobility of ~10' x2 and ~200' with FWW and cg(A)-sb(a)X1 without LOB.     Discharge Needs (per PT note 6/3):   Equipment Recommendations: Front Wheeled Gilford Rile The patient presents with mobility limitations due to impaired balance, impaired strength and impaired functional activity tolerance that significantly impair/prevent patients ability to participate in mobility-related activities of daily living (MRADLs) including ambulation and transfers in order to safely complete, toileting, bathing, safely entering/exiting the home, in reasonable time. This functional mobility deficit can be sufficiently resolved with the use of a front wheel walker in order to decrease the risk of falls, morbidity, and mortality in performance of these MRADLs. Patient is able to safely use this assistive device.  Further Treatment Recommendations:  anticipate home to improve functional strength, balance, endurance, and mobility.        Plan:   Continue to follow patient according to established plan of care.    The risks/benefits of therapy have been discussed with the patient/caregiver and he/she is in agreement with the established plan of care.     Therapist:   Willis Modena, PTA 07/17/2014 12:51  Pager #: [phone]  Treatment Time: 23 minutes

## 2014-07-17 NOTE — Nurses Notes (Signed)
aptt 101.5. Rate dropped by 1 to 2100units/hr from 2200units/hr. Will recheck aptt at 0145.

## 2014-07-17 NOTE — Care Plan (Signed)
Medical Nutrition Therapy  Note: Pt is currently on Coumadin and receiving ensure clear TID. Protein supplements contain Vitamin K which may interfere with the coumadin dose.  Please monitor coagulation factors regularly.  Kenneth Weeks, [place]  07/17/2014, 07:20

## 2014-07-17 NOTE — Nurses Notes (Signed)
Results for SMOKEY, MELOTT (MRN [id number]) as of 07/17/2014 05:11   Ref. Range 07/17/2014 04:46   APTT Latest Ref Range: 25.1-36.5 Sec 82.9 (H)   Per protocol no change. Next ptt due at 11am. Will continue to monitor.   Lawson Fiscal, RN  07/17/2014, 05:13

## 2014-07-17 NOTE — Nurses Notes (Addendum)
7 beat run vtach report by telemetry. Service notified. Claiborne Billings) page returned, service will look into event

## 2014-07-17 NOTE — Care Plan (Signed)
Problem: General Plan of Care(Adult,OB)  Goal: Plan of Care Review(Adult,OB)  The patient and/or their representative will communicate an understanding of their plan of care   Outcome: Ongoing (see interventions/notes)  Pt had good tolerance to tx. Pt completed functional mobility of ~10' x2 and ~200' with FWW and cg(A)-sb(a)X1 without LOB.   Discharge Needs (per PT note 6/3):    Equipment Recommendations: Front Wheeled Gilford Rile The patient presents with mobility limitations due to impaired balance, impaired strength and impaired functional activity tolerance that significantly impair/prevent patients ability to participate in mobility-related activities of daily living (MRADLs) including  ambulation and transfers in order to safely complete, toileting, bathing, safely entering/exiting the home, in reasonable time. This functional mobility deficit can be sufficiently resolved with the use of a  front wheel walker in order to decrease the risk of falls, morbidity, and mortality in performance of these MRADLs.  Patient is able to safely use this assistive device.  Further Treatment Recommendations: anticipate home to improve functional strength, balance, endurance, and mobility.         Linde Gillis, PTA  Pager # [phone]

## 2014-07-17 NOTE — Progress Notes (Addendum)
[facility]  Surgical Oncology Consult  Follow Up    Truitt, Cruey, 64 y.o. male  Date of Service: 07/17/2014  Date of Birth:  May 21, 1950    Hospital Day:  LOS: 7 days     Subjective/Overnight Events:  Patient feeling better today and in good spirits.  He is tolerating a diet and passing flatus but hasn't had a BM.  No emesis since yesterday AM.      Objective:  Temperature: 36.6 C (97.9 F)  Heart Rate: 97  BP (Non-Invasive): 116/70 mmHg  Respiratory Rate: 18  SpO2-1: 95 %  Pain Score (Numeric, Faces): 0  General:  NAD, lying comfortably in bed  HENT:  NCAT  Lungs: Breathing unlabored, equal chest rise  Cardiovascular:  RR.  Abdomen:  Soft, non-distended, non-tender, incision c/d/i w/o evidence for erythema or drainage  Extremities:  No cyanosis  Skin:  Skin warm and dry  Neurologic:  A+Ox3    Psychiatric:  Affect normal.    Labs:    BMP:     Recent Labs      07/17/14   0415   SODIUM  135*   POTASSIUM  3.1*   CHLORIDE  96   CO2  27   BUN  8   CREATININE  0.99   GLUCOSENF  114   ANIONGAP  12   BUNCRRATIO  8   GFR  >59   CALCIUM  8.2*     CBC Results Differential Results   Recent Labs      07/17/14   0415   WBC  6.3   HGB  9.0*   HCT  28.1*   PLTCNT  201    Recent Results (from the past 30 hour(s))   CBC/DIFF    Collection Time: 07/17/14  4:15 AM   Result Value    WBC 6.3    PMN'S 75    LYMPHOCYTES 15    MONOCYTES 8    EOSINOPHIL 1    BASOPHILS 1        Hepatic Function:    No results found for this encounter  Magnesium:     Recent Labs      07/17/14   0415   MAGNESIUM  1.4*     Phosphorus:     Recent Labs      07/17/14   0415   PHOSPHORUS  3.5     Coags:    Recent Labs      07/17/14   0446   PROTHROMTME  23.0*   INR  2.07*   APTT  82.9*       Imaging Studies:  07/11/2014 - CT A/P w/w/o contrast - [place]  Findings: There is small bilateral pleural effusions with associated atelectasis. There is the 4 mm nodule within the right lower lobe series 5 image 2. Followup with CT chest in 6-12 months  is recommended. There is an additional nodule at approximately 2 mm on series 5 image 2 within the right upper lobe inferiorly.    There is cholelithiasis and mild gallbladder wall thickening. There is no biliary ductal dilatation. No evidence for hepatic mass on this single phase study. No specific evidence for sclerotic or fibrotic change. There is moderate atrophic change of the pancreas. Spleen is normal size. There are small porta hepatis lymph nodes which are likely reactive. There is no hydronephrosis or hydroureter.    There is no evidence for bowel obstruction. No evidence for significant bowel wall thickening in a patient  with known mass within the proximal right colon. The appendix is unremarkable. There is diverticulosis in the sigmoid colon without evidence of complicated disease.    There is mild mesenteric stranding and a minimal amount of free fluid within the pelvis. There is a rectal tube in place. There is a left inguinal hernia containing only fat. There are scattered small mesenteric and retroperitoneal lymph nodes without significant lymphadenopathy by size criteria.    There are degenerative changes in the spine without evidence for acute bony abnormality.    IMPRESSION:   1.Small bilateral pleural effusions. 2 pulmonary nodules, largest measuring 4 mm. Followup with CT chest in 6-12 months is recommended.  2. Cholelithiasis with mild gallbladder wall thickening. If there is concern for acute cholecystitis further evaluation with ultrasound is recommended.  3. Patient's known cecal mass is not visualized. No evidence of obstruction. Diverticulosis without evidence for complicated disease.  4. Mild nonspecific mesenteric stranding and minimal free fluid within the pelvis.    Assessment/Recommendations:  Patient is a 64 y/o M, admitted 07/10/2014; HD  LOS: 7 days  with Hx of Marfans, mechanical aortic valve (1999) (on anticoaggulation), aFib (s/p cardioversion), DM,  hypothyroidism, who presented to [facility] w/ bloody diarrhea, supratherapeutic INR and hypotension.  Admitted to MICU, INR reversed, resuscitated and started on heparin drip.  GI consulted and colonoscopy remarkable for large mass along the 1st fold of the ascending colon concerning for malignancy.  Surg Onc consulted for evaluation.  He has underwent:    1.  07/10/2014 - Colonoscopy w/ Bx  bx results: adenocarcinoma     2. laproscopic Right hemicolectomy with stapled anastomosis  Final Pathology pending  As of now    ALL:  NKDA  Meds:  Coreg, Lasix, synthroid, Mg, K, spironolactone, coumadin    PMHx:  As above  PSHx:  As above + T&A    VSS, aFEB, URINE OUTPUT appropriate    Labs reviewed  -Hb stable @ 9.0  -K 3.1  -Mg 1.4    1.  Lower GI bleed secondary to supratherapic INR aand ascending colon mass concerning for malignancy - POD#4 s/p right hemicolectomy:   -Advance to regular diet  -Continue scheduled Zofran and Reglan  -Add bowel regimen - Colace, Senokot    2.  Hx of mechanical heart valve:  - Currently on heparin gtt - continue until INR 2.5  - Warfarin 7.5 mg tonight - INR currently 2.07    Miscellaneous/Home Meds:  Lasix and spironolactone resumed    ABX:  None  Diet/fluids: Regular  Activity:  As tolerated  Pain control:  Roxicodone  GI/DVT prophylaxis:  Protonix  Flatus/last BM:  +/-  PT/OT ordered/dispo recs:  ordered  Lines:  07/11/2014 - PICC  Dispo:  Continue floor status      Doyne Keel, MD  07/17/2014, 07:23    I saw and examined the patient.  I reviewed the resident's note.  I agree with the findings and plan of care as documented in the resident's note.  Any exceptions/additions are edited/noted.    Soft diet    7.5mg  warfarin tonight    Replete electrolytes (K and Mg)    Gwinda Passe, MD 07/17/2014, 07:46

## 2014-07-17 NOTE — Nurses Notes (Signed)
aptt came back at 56.3. 3500unit bolus given and heparin gtt increased by 1 to 2200units/hr.  Second check completed. Will redraw aptt at 1830.

## 2014-07-18 ENCOUNTER — Other Ambulatory Visit (INDEPENDENT_AMBULATORY_CARE_PROVIDER_SITE_OTHER): Payer: Self-pay | Admitting: Surgery

## 2014-07-18 DIAGNOSIS — C189 Malignant neoplasm of colon, unspecified: Secondary | ICD-10-CM

## 2014-07-18 LAB — BASIC METABOLIC PANEL
ANION GAP: 12 mmol/L (ref 4–13)
BUN/CREAT RATIO: 7 (ref 6–22)
BUN: 8 mg/dL (ref 8–25)
CALCIUM: 8.5 mg/dL (ref 8.5–10.4)
CARBON DIOXIDE: 25 mmol/L (ref 22–32)
CHLORIDE: 95 mmol/L — ABNORMAL LOW (ref 96–111)
CREATININE: 1.17 mg/dL (ref 0.62–1.27)
ESTIMATED GLOMERULAR FILTRATION RATE: >59 ml/min/1.73m2 (ref >59)
GLUCOSE,NONFAST: 115 mg/dL (ref 65–139)
POTASSIUM: 3.2 mmol/L — ABNORMAL LOW (ref 3.5–5.1)
SODIUM: 132 mmol/L — ABNORMAL LOW (ref 136–145)

## 2014-07-18 LAB — CBC
HCT: 28.1 % — ABNORMAL LOW (ref 36.7–47.0)
HCT: 28.1 % — ABNORMAL LOW (ref 36.7–47.0)
HGB: 9.1 g/dL — ABNORMAL LOW (ref 12.5–16.3)
MCH: 26.1 pg — ABNORMAL LOW (ref 27.4–33.0)
MCHC: 32.3 g/dL — ABNORMAL LOW (ref 32.5–35.8)
MCV: 80.9 fL (ref 78–100)
MPV: 8.1 fL (ref 7.5–11.5)
PLATELET COUNT: 211 THOU/uL (ref 140–450)
RBC: 3.47 MIL/uL — ABNORMAL LOW (ref 4.06–5.63)
RDW: 19.1 % — ABNORMAL HIGH (ref 12.0–15.0)
WBC: 5 THOU/uL (ref 3.5–11.0)

## 2014-07-18 LAB — MAGNESIUM: MAGNESIUM: 1.5 mg/dL — ABNORMAL LOW (ref 1.6–2.5)

## 2014-07-18 LAB — PERFORM POC WHOLE BLOOD GLUCOSE
GLUCOSE, POINT OF CARE: 118 mg/dL — ABNORMAL HIGH (ref 70–105)
GLUCOSE, POINT OF CARE: 123 mg/dL — ABNORMAL HIGH (ref 70–105)

## 2014-07-18 LAB — PHOSPHORUS: PHOSPHORUS: 4.1 mg/dL — ABNORMAL HIGH (ref 2.3–4.0)

## 2014-07-18 LAB — PT/INR
INR: 2.64 — ABNORMAL HIGH (ref 0.80–1.20)
PROTHROMBIN TIME: 29.3 s — ABNORMAL HIGH (ref 9.0–13.4)

## 2014-07-18 LAB — PTT (PARTIAL THROMBOPLASTIN TIME)
APTT: 89 s — ABNORMAL HIGH (ref 25.1–36.5)
APTT: 197.6 s (ref 25.1–36.5)

## 2014-07-18 LAB — HISTORICAL SURGICAL PATHOLOGY SPECIMEN

## 2014-07-18 MED ORDER — MAGNESIUM OXIDE 400 MG (241.3 MG MAGNESIUM) TABLET
400.00 mg | ORAL_TABLET | Freq: Two times a day (BID) | ORAL | Status: DC
Start: 2014-07-18 — End: 2014-07-18
  Filled 2014-07-18 (×2): qty 1

## 2014-07-18 MED ORDER — DOCUSATE SODIUM 100 MG CAPSULE
100.00 mg | ORAL_CAPSULE | Freq: Two times a day (BID) | ORAL | Status: AC
Start: 2014-07-18 — End: ?

## 2014-07-18 MED ORDER — WARFARIN 5 MG TABLET
5.00 mg | ORAL_TABLET | Freq: Every evening | ORAL | Status: DC
Start: 2014-07-18 — End: 2014-11-30

## 2014-07-18 MED ORDER — FUROSEMIDE 40 MG TABLET
40.0000 mg | ORAL_TABLET | Freq: Every day | ORAL | Status: DC
Start: 2014-07-18 — End: 2015-04-17

## 2014-07-18 MED ORDER — POTASSIUM CHLORIDE ER 20 MEQ TABLET,EXTENDED RELEASE(PART/CRYST)
40.0000 meq | ORAL_TABLET | ORAL | Status: DC
Start: 2014-07-18 — End: 2014-07-18
  Filled 2014-07-18: qty 2

## 2014-07-18 MED ORDER — BACITRACIN ZINC 500 UNIT/GRAM TOPICAL OINTMENT IN PACKET
TOPICAL_OINTMENT | CUTANEOUS | Status: DC
Start: 2014-07-18 — End: 2014-07-18
  Filled 2014-07-18: qty 1

## 2014-07-18 MED ORDER — ACETAMINOPHEN 325 MG TABLET
650.00 mg | ORAL_TABLET | ORAL | Status: DC | PRN
Start: 2014-07-18 — End: 2014-07-30

## 2014-07-18 MED ADMIN — enoxaparin 40 mg/0.4 mL subcutaneous syringe: ORAL | @ 08:00:00

## 2014-07-18 MED ADMIN — sodium chloride 0.9 % intravenous solution: ORAL | @ 05:00:00 | NDC 00338004904

## 2014-07-18 MED ADMIN — sodium chloride 0.9 % (flush) injection syringe: @ 12:00:00

## 2014-07-18 NOTE — PT Treatment (Signed)
[facility]  Rehabilitation Services  Physical Therapy Progress Note    Patient Name: Kenneth Weeks  Date of Birth: 1950-02-24  Room/Bed: 779/B  Date/Time of Admission: 07/10/2014  4:09 PM  Admitting Diagnosis:  GI bleed [K92.2]    Subjective:   RN OK"ed tx. Pt pleasant and agreeable to tx. Pt reports feeling better today.  Pt reports going to BR multiple times throughout the night without use of AD and using IV pole.     Objective:   Pt seen with OT this AM. Pt seated in chair upon arrival. Pt stood (I)ly from chair without AD. Pt then returned to chair (I)ly. Pt remained in chair and taken to rehab gym. Pt stood (I)ly. Pt then completed 4 steps up/down with 1HR and cgAx1 with no LOB. Pt then completed functional mobility of ~40' with cg(a)X1. Pt then given [facility] and completed functional mobility of ~40' with SPC and cg(a)-sb(A)x1. Pt then sat in chair and returned to room. Pt had no further needs @ this time.     Status at end of PT tx:  Positioning: in chair  Call bell in reach: yes  Phone in reach: yes  Mobility alarm: n/a  Veno dynes: n/a  Whiteboard updated: yes    Assessment:   Pt had good tolerance to tx. Pt completed functional mobility of ~40' with SPC and cg(a)X1. Pt completed 4 steps up/down with 1HR and cgAx1 without LOB.     Discharge Needs:   Equipment Recommendations: Cane   Further Treatment Recommendations: home with assist as needed to improve functional strength, balance, endurance, and mobility.    Plan:   Continue to follow patient according to established plan of care.    The risks/benefits of therapy have been discussed with the patient/caregiver and he/she is in agreement with the established plan of care.     Therapist:   Willis Modena, PTA 07/18/2014 14:00  Pager #: [phone]  Treatment Time: 25 minutes

## 2014-07-18 NOTE — Nurses Notes (Signed)
PTT 89.0.  No change needed at this time. Next PTT scheduled for 0745.  Will monitor.

## 2014-07-18 NOTE — Care Management Notes (Addendum)
Care Coordinator/Social Work [place]  Patient Name: Kenneth Weeks   MRN: [id number]   [place] Number: [id number]  DOB: December 13, 1950 Age: 65  **Admission Information**  Patient Type: INPATIENT  Admit Date: 07/10/2014 Admit Time: 16:09  Admit Reason: Gastrointestinal hemorrhage, unspecified  Admitting Phys: [facility]   Attending Phys: Gwinda Passe  Unit: 7E Bed: 779-B  160. LOC Notification, CMS Important Message / Detailed Notice  Created by : Vinnie Level Date/Time 2014-07-18 13:15:05.000  Medicare Important Message/Detailed Notice  Admission- CMS-Important Message from Medicare About Your Rights (CMS-1405) IMM was delivered to the patient. A copy was  provided to the patient. (Date / Time) Patient 07/11/14 1:45

## 2014-07-18 NOTE — Care Plan (Signed)
Problem: General Plan of Care(Adult,OB)  Goal: Plan of Care Review(Adult,OB)  The patient and/or their representative will communicate an understanding of their plan of care   Outcome: Ongoing (see interventions/notes)  Pt had good tolerance to tx. Pt completed functional mobility of ~40' with SPC and cg(a)X1. Pt completed 4 steps up/down with 1HR and cgAx1 without LOB.   Discharge Needs:   Equipment Recommendations: Cane   Further Treatment Recommendations: home with assist as needed to improve functional strength, balance, endurance, and mobility.  Linde Gillis, PTA  Pager # [phone]

## 2014-07-18 NOTE — Discharge Summary (Addendum)
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       Details    acetaminophen 325 mg Tablet   Commonly known as:  TYLENOL    650 mg, Oral, EVERY 4 HOURS PRN   Refills:  0       docusate sodium 100 mg Capsule   Commonly known as:  COLACE    100 mg, Oral, 2 TIMES DAILY   Refills:  0         CONTINUE these medications which have CHANGED during your visit.       Details    furosemide 40 mg Tablet   Commonly known as:  LASIX   What changed:  when to take this    40 mg, Oral, DAILY   Refills:  0       warfarin 5 mg Tablet   Commonly known as:  COUMADIN   What changed:    - medication strength  - how much to take  - when to take this  - additional instructions    5 mg, Oral, EVERY EVENING   Qty:  30 Tab   Refills:  0         CONTINUE these medications - NO CHANGES were made during your visit.       Details    carvedilol 12.5 mg Tablet     Commonly known as:  COREG    12.5 mg, Oral, 2 TIMES DAILY WITH FOOD   Qty:  60 Tab   Refills:  5       levothyroxine 200 mcg Tablet   Commonly known as:  SYNTHROID    200 mcg, Oral, DAILY,     Refills:  0       MAGNESIUM CARBONATE ORAL    Oral, DAILY   Refills:  0       POTASSIUM GLUCONATE ORAL    Oral, DAILY   Refills:  0       spironolactone 50 mg Tablet   Commonly known as:  ALDACTONE    50 mg, Oral, DAILY   Qty:  30 Tab   Refills:  5  DISCHARGE INSTRUCTIONS:      COLONOSCOPY     PT/INR     PT/INR --  Please select expected date     DISCHARGE INSTRUCTION - DIET   Diet: LOW FIBER      DISCHARGE INSTRUCTION - INCISION/WOUND CARE   Instructions for incision/wound care: May Shower, NO Tub Baths or Swimming      DISCHARGE INSTRUCTION - ACTIVITY   Activity: NO LIFTING OVER 10 POUNDS FOR 6 WEEKS      DISCHARGE INSTRUCTION - MISC   Your dose of Lasix has been reduced to one time per day.  Please check with your internal medicine physician after discharge to monitor your electrolytes and to resume your regular dose of taking Lasix three times per day.     DISCHARGE INSTRUCTION - WARFARIN (COUMADIN) FOLLOW-UP   Provider to monitor warfarin: Dr. Anabel Bene    Next INR to be drawn: 07/20/2014 at 9:30 am           [place]:  This is a 64 y.o., male with a h/o Marfans, mechanical aortic valve (1999) (on anticoaggulation), aFib (s/p cardioversion), DM, and hypothyroidism, who presented to [facility] w/ bloody diarrhea, supratherapeutic INR and hypotension. He was admitted to MICU, the INR was reversed, he was resuscitated and started on heparin drip. GI consulted and colonoscopy remarkable for large mass along the 1st fold of the ascending colon concerning for malignancy. Surg Onc consulted for evaluation. He then underwent a laproscopic Right hemicolectomy with stapled anastomosis on 07/13/2014.  He toelrated the surgery well and had no posto perative complications.      By POD # 5, the  patient's INR was therapeutic and the hperain drip was discontinued.  He was on a regular diet and having flatus.  He was deemed stable for discharge to home.      Upon discharge, his coumadin dose was adjusted to 5 mg nightly (due to his being supra-therapeutic upon presentation to the hospital).  Also, his normal home dosage of Lasix was reduced from three times per day to once daily.  The patient will follow-up with his PCP upon discharge and receive instructions on increasing the dose        CONDITION ON DISCHARGE:  A. Ambulation: Full ambulation - will obtain a FWW for home use   B. Self-care Ability: Complete  C. Cognitive Status Alert and Oriented x 3  D. DNR status at discharge: Full Code    DISCHARGE DISPOSITION:  Home discharge                Carlis Abbott, PA-C              Copies sent to Care Team       Relationship Specialty Notifications Start End    Viona Gilmore, MD PCP - [facility]  05/11/14     Phone: [phone] Fax: [phone]         [address] [place] [address]          Referring providers can utilize [email or web address] to access their referred [place] patient's information or call [phone] to contact this provider.

## 2014-07-18 NOTE — OT Treatment (Signed)
[facility]  Rehabilitation Services  Occupational Therapy Progress Note    Patient Name: Kenneth Weeks  Date of Birth: 07-13-1950  Height:  193 cm (6' 3.98")  Weight:  (!) 139.3 kg (307 lb 1.6 oz)  Room/Bed: 779/B  Payor: MEDICARE / Plan: MEDICARE PART A AND B / Product Type: Medicare /     Date/Time of Admission: 07/10/2014  4:09 PM  Admitting Diagnosis:  GI bleed [K92.2]      Subjective:     Pt alert and talkative.        Objective:   Precautions:  Fall risk  Pt was in recliner chair upon entering the room.    RN approved session.  Patient agreeable to OT treatment. Treatment session completed with professional assistance of Physical Therapist. Pt completed STS from recliner chair Ily using BUE to A with good control.  Pt was instructed in use of a st cane to A with functional mobility.  Pt demonstrated good functional balance with reaching for items, opening doors. Pt still requires A with donning LE garments.  Pt is able to complete post toilet hygiene Ily.     At end of session pt in recliner chair  call button in reach and all expressed  needs met.      Assessment:   Pt tolerated OT treatment session well.  Pt continues to make good progress.  Pt is requiring less asisitiance with mobility and transfers.  When pt is medically stable, pt should be able to return home with intermittent support from family and use of st cane.          Discharge Needs:   Equipment Recommendations: None Anticipated  Further Treatment Recommendations: Home      Continue to follow according to established plan of care.    The risks/benefits of therapy have been discussed with the patient/caregiver and he/she is in agreement with the established plan of care.     Therapist:   Nelma Rothman, OT 07/18/2014 13:34  Pager #: [phone]  Treatment Time: 30 minutes

## 2014-07-18 NOTE — Progress Notes (Addendum)
[facility]  Surgical Oncology Consult  Follow Up    Kenneth Weeks, Ge[place], 64 y.o. male  Date of Service: 07/18/2014  Date of Birth:  [birth year]    Hospital Day:  LOS: 8 days     Subjective/Overnight Events:   NAE overnight  Patient doing well and states he feels well.   He is tolerating a diet and passing flatus.  No BM yet  No emesis.     Objective:  Temperature: 36.5 C (97.7 F)  Heart Rate: 93  BP (Non-Invasive): 110/78 mmHg  Respiratory Rate: 16  SpO2-1: 97 %  Pain Score (Numeric, Faces): 0  General:  NAD, sitting  comfortably in bed  Lungs: Breathing unlabored, equal chest rise  Cardiovascular:  RR with murmur.  Abdomen:  Soft, non-distended, non-tender, incision c/d/i w/o evidence for erythema or drainage  Extremities:  No cyanosis  Skin:  Skin warm and dry  Neurologic:  A+Ox3    Psychiatric:  Affect normal.    Labs:    BMP:     Recent Labs      07/18/14   0530   SODIUM  132*   POTASSIUM  3.2*   CHLORIDE  95*   CO2  25   BUN  8   CREATININE  1.17   GLUCOSENF  115   ANIONGAP  12   BUNCRRATIO  7   GFR  >59   CALCIUM  8.5     CBC Results Differential Results   Recent Labs      07/18/14   0530   WBC  5.0   HGB  9.1*   HCT  28.1*   PLTCNT  211    Recent Results (from the past 30 hour(s))   CBC/DIFF    Collection Time: 07/17/14  4:15 AM   Result Value    WBC 6.3    PMN'S 75    LYMPHOCYTES 15    MONOCYTES 8    EOSINOPHIL 1    BASOPHILS 1        Hepatic Function:    No results found for this encounter  Magnesium:     Recent Labs      07/18/14   0530   MAGNESIUM  1.5*     Phosphorus:     Recent Labs      07/18/14   0530   PHOSPHORUS  4.1*     Coags:    Recent Labs      07/18/14   0144  07/18/14   0530   PROTHROMTME   --   29.3*   INR   --   2.64*   APTT  89.0*   --      PATHOLOGY:  Surgical Path is pending     Imaging Studies:  No new images to review    Assessment/Recommendations:  Patient is a 64 y/o M, admitted 07/10/2014; HD  LOS: 8 days  with Hx of Marfans, mechanical aortic valve (1999) (on  anticoaggulation), aFib (s/p cardioversion), DM, hypothyroidism, who presented to [facility] w/ bloody diarrhea, supratherapeutic INR and hypotension.  Admitted to MICU, INR reversed, resuscitated and started on heparin drip.  GI consulted and colonoscopy remarkable for large mass along the 1st fold of the ascending colon concerning for malignancy.  Surg Onc consulted for evaluation.  He has underwent:    1.  07/10/2014 - Colonoscopy w/ Bx  bx results: adenocarcinoma     2. laproscopic Right hemicolectomy with stapled anastomosis  Final Pathology pending  ALL:  NKDA  Meds:  Coreg, Lasix, synthroid, Mg, K, spironolactone, coumadin    PMHx:  As above  PSHx:  As above + T&A    VSS, aFEB, URINE OUTPUT appropriate    Labs reviewed  -Hb stable @ 9.0  INR 2.64  -K 3.2  -Mg 1.5    1.  Lower GI bleed secondary to supratherapic INR aand ascending colon mass concerning for malignancy - POD#5 s/p right hemicolectomy:   -Advance to regular diet  -Cont bowel regimen - Colace, Senokot    2.  Hx of mechanical heart valve:  - Ok to D/C heparin gtt -  INR therapeutic at  2.64  - Warfarin 7.5 mg tonight - patient to follow with internal medicine MD upon discharge   Miscellaneous/Home Meds:  Lasix and spironolactone resumed    ABX:  None  Diet/fluids: Regular  Activity:  As tolerated  Pain control:  Roxicodone (pt not taking)  GI/DVT prophylaxis:  Protonix  Flatus/last BM:  +/-  PT/OT ordered/dispo recs:  ordered  Lines:  07/11/2014 - PICC (to be removed)  Dispo:  Continue floor status.  Plan for home discharge today.     Carlis Abbott, PA-C  07/18/2014, 07:23      ADDENDUM: I spoke with Dr. Vonzell Schlatter office this morning to confirm that he will continue is INR monitoring through his office upon discharge.  I recommended a repeat INR for Friday.  Dr. Anabel Bene uses Epic, so I have to place the order.  They can see all the other documentation from his hospital stay.    They have asked that the patient come to the office at 9:30 am  Friday for his INR draw.      Carlis Abbott, PA-C  07/18/2014, 08:18    I personally saw and evaluated the patient. See mid-level's note for additional details. My findings/participation are     INR 2.6 today    Otherwise doing well    OK to stop heparin and discharge to home on 5 mg coumadin for next few days with repeat INR on Friday    Gwinda Passe, MD 07/18/2014, 13:52

## 2014-07-18 NOTE — Care Plan (Signed)
Problem: General Plan of Care(Adult,OB)  Goal: Plan of Care Review(Adult,OB)  The patient and/or their representative will communicate an understanding of their plan of care   Outcome: Ongoing (see interventions/notes)  Pt tolerated OT treatment session well.  Pt continues to make good progress.  Pt is requiring less asisitiance with mobility and transfers.  When pt is medically stable, pt should be able to return home with intermittent support from family and use of st cane.            Discharge Needs:   Equipment Recommendations: None Anticipated  Further Treatment Recommendations: Home      Continue to follow according to established plan of care.    The risks/benefits of therapy have been discussed with the patient/caregiver and he/she is in agreement with the established plan of care.   Therapist:   Geanie Logan, OT 07/18/2014 13:34  Pager #: [phone]

## 2014-07-18 NOTE — Care Plan (Signed)
Problem: General Plan of Care(Adult,OB)  Goal: Plan of Care Review(Adult,OB)  The patient and/or their representative will communicate an understanding of their plan of care   Outcome: Adequate for Discharge Date Met:  07/18/14  PIV and PICC in LUE removed. Pt has no complaints of pain. Tolerating regular diet. Lap sites intact on abdomen. Ambulating in hallways using walker. Encouraged to increase fluid intake. Went over follow up information with pt and spouse; both verbalize understanding. Discharge complete. Will continue to monitor.

## 2014-07-18 NOTE — Nurses Notes (Signed)
Left PICC line removed for discharge per order. 57cm removed. Tip intact. Pt tolerated well. Pressure applied 2 X 2 w/bacitracin applied. Will continue to monitor.

## 2014-07-19 ENCOUNTER — Encounter (INDEPENDENT_AMBULATORY_CARE_PROVIDER_SITE_OTHER): Payer: Self-pay | Admitting: Surgery

## 2014-07-19 NOTE — Progress Notes (Signed)
Patient stated that he is doing good, no needs at this time. PCM survey completed. Lace score 15.  Loutricia Sandoval M.A.

## 2014-07-20 ENCOUNTER — Telehealth (HOSPITAL_BASED_OUTPATIENT_CLINIC_OR_DEPARTMENT_OTHER): Payer: Self-pay | Admitting: Internal Medicine

## 2014-07-20 ENCOUNTER — Ambulatory Visit
Admission: RE | Admit: 2014-07-20 | Discharge: 2014-07-20 | Disposition: A | Discharge status: Home / Self Care | Payer: Medicare Other | Source: Ambulatory Visit | Attending: INTERNAL MEDICINE | Admitting: INTERNAL MEDICINE

## 2014-07-20 DIAGNOSIS — Z7901 Long term (current) use of anticoagulants: Secondary | ICD-10-CM

## 2014-07-20 DIAGNOSIS — Z952 Presence of prosthetic heart valve: Secondary | ICD-10-CM

## 2014-07-20 DIAGNOSIS — I482 Chronic atrial fibrillation, unspecified: Secondary | ICD-10-CM

## 2014-07-20 DIAGNOSIS — Z954 Presence of other heart-valve replacement: Secondary | ICD-10-CM | POA: Insufficient documentation

## 2014-07-20 DIAGNOSIS — Z5181 Encounter for therapeutic drug level monitoring: Secondary | ICD-10-CM

## 2014-07-20 LAB — PT/INR
INR: 1.83
PROTHROMBIN TIME: 20.3 s — ABNORMAL HIGH (ref 9.4–12.5)

## 2014-07-20 NOTE — Telephone Encounter (Signed)
Spoke with pt, who had called answering service, concerned about INR 1.86 this morning.  Has very recently had life-threatening bleed when INR greater than 10.  Previously on warfarin 7.5 mg each evening M-F, 10 mg on Sat and Sun    D/c'd from [facility] on 5 mg qhs.  Has mechanical AVR    Rec increase for next 2 nights to 7.5 mg qhs; recheck INR on Sunday 6/12 (go through ER just to register)  Call for results

## 2014-07-22 ENCOUNTER — Ambulatory Visit (HOSPITAL_BASED_OUTPATIENT_CLINIC_OR_DEPARTMENT_OTHER)
Admission: RE | Admit: 2014-07-22 | Discharge: 2014-07-22 | Disposition: A | Discharge status: Home / Self Care | Payer: Medicare Other | Source: Ambulatory Visit | Attending: Internal Medicine | Admitting: Internal Medicine

## 2014-07-22 ENCOUNTER — Telehealth (INDEPENDENT_AMBULATORY_CARE_PROVIDER_SITE_OTHER): Payer: Self-pay | Admitting: Internal Medicine

## 2014-07-22 DIAGNOSIS — Z7901 Long term (current) use of anticoagulants: Secondary | ICD-10-CM | POA: Insufficient documentation

## 2014-07-22 LAB — PT/INR
INR: 2
PROTHROMBIN TIME: 22.2 s — ABNORMAL HIGH (ref 9.4–12.5)

## 2014-07-22 NOTE — Telephone Encounter (Signed)
INR up to 2.0 after 2 days of 7.5 mg warfarin  (prev on 5 mg since d/c from [facility]) LM on VM for him to take 10 mg tonight and tomorrow; recheck INR on 6/14

## 2014-07-23 ENCOUNTER — Ambulatory Visit (HOSPITAL_BASED_OUTPATIENT_CLINIC_OR_DEPARTMENT_OTHER): Payer: Self-pay | Admitting: Surgery

## 2014-07-23 NOTE — Telephone Encounter (Signed)
>>   KRISTY SNYDER 07/23/2014 12:00 PM  Dr Catarina Hartshorn  Patient is wondering if his appointment on 07/30/2014 at the [facility] can be moved to the [facility] on 07/26/2014 since he has other appointments there that day.  Please advise. Thank you                 Call History         Type Contact Phone User     07/23/2014 11:59 AM Phone (Incoming) Meleski, Kashius Dominic (Self) [phone] Jerilynn Mages) Alain Honey         Returned call to pt and informed him that we were able to get all his appointments on Thursday, July 26, 2014.  Pt very appreciative due to long travel time.

## 2014-07-24 ENCOUNTER — Ambulatory Visit
Admission: RE | Admit: 2014-07-24 | Discharge: 2014-07-24 | Disposition: A | Discharge status: Home / Self Care | Payer: Medicare Other | Source: Ambulatory Visit | Attending: INTERNAL MEDICINE | Admitting: INTERNAL MEDICINE

## 2014-07-24 DIAGNOSIS — Z952 Presence of prosthetic heart valve: Secondary | ICD-10-CM

## 2014-07-24 DIAGNOSIS — Z7901 Long term (current) use of anticoagulants: Secondary | ICD-10-CM | POA: Insufficient documentation

## 2014-07-24 DIAGNOSIS — Z5181 Encounter for therapeutic drug level monitoring: Secondary | ICD-10-CM

## 2014-07-24 DIAGNOSIS — I482 Chronic atrial fibrillation, unspecified: Secondary | ICD-10-CM

## 2014-07-24 DIAGNOSIS — Z954 Presence of other heart-valve replacement: Secondary | ICD-10-CM | POA: Insufficient documentation

## 2014-07-24 LAB — PROTHROMBIN TIME, THERAPEUTIC
INR: 2.45
PROTHROMBIN TIME: 26.9 s (ref 9.4–12.5)
TIME OF LAST DOSE: 2300

## 2014-07-25 ENCOUNTER — Ambulatory Visit (HOSPITAL_BASED_OUTPATIENT_CLINIC_OR_DEPARTMENT_OTHER): Payer: Self-pay | Admitting: INTERNAL MEDICINE

## 2014-07-25 NOTE — Progress Notes (Signed)
Pt.notified

## 2014-07-25 NOTE — Progress Notes (Signed)
07/25/14 0800   [facility] PT/INR   Date INR drawn 07/24/14   INR 2.5   New warfarin dosage Keep the same   New weekly dosgae No changes   Comments Check INR in 2 weeks   Initials RTM   Anticoag Indication Mechanical valve   Goal INR 2.5-3.5

## 2014-07-26 ENCOUNTER — Ambulatory Visit (HOSPITAL_BASED_OUTPATIENT_CLINIC_OR_DEPARTMENT_OTHER): Payer: Medicare Other

## 2014-07-26 ENCOUNTER — Ambulatory Visit
Admission: RE | Admit: 2014-07-26 | Discharge: 2014-07-26 | Disposition: A | Discharge status: Home / Self Care | Payer: Medicare Other | Source: Ambulatory Visit | Attending: Surgery | Admitting: Surgery

## 2014-07-26 ENCOUNTER — Ambulatory Visit (HOSPITAL_BASED_OUTPATIENT_CLINIC_OR_DEPARTMENT_OTHER)
Admission: RE | Admit: 2014-07-26 | Discharge: 2014-07-26 | Disposition: A | Discharge status: Home / Self Care | Payer: Medicare Other | Source: Ambulatory Visit | Attending: Surgery | Admitting: Surgery

## 2014-07-26 ENCOUNTER — Encounter (HOSPITAL_BASED_OUTPATIENT_CLINIC_OR_DEPARTMENT_OTHER): Payer: Self-pay | Admitting: Student in an Organized Health Care Education/Training Program

## 2014-07-26 ENCOUNTER — Ambulatory Visit (HOSPITAL_BASED_OUTPATIENT_CLINIC_OR_DEPARTMENT_OTHER)
Admission: RE | Admit: 2014-07-26 | Discharge: 2014-07-26 | Disposition: A | Discharge status: Home / Self Care | Payer: Medicare Other | Source: Ambulatory Visit | Attending: Student in an Organized Health Care Education/Training Program | Admitting: Student in an Organized Health Care Education/Training Program

## 2014-07-26 ENCOUNTER — Ambulatory Visit (HOSPITAL_BASED_OUTPATIENT_CLINIC_OR_DEPARTMENT_OTHER)
Admission: RE | Admit: 2014-07-26 | Discharge: 2014-07-26 | Disposition: A | Discharge status: Home / Self Care | Payer: Medicare Other | Source: Ambulatory Visit

## 2014-07-26 ENCOUNTER — Ambulatory Visit (INDEPENDENT_AMBULATORY_CARE_PROVIDER_SITE_OTHER): Payer: Self-pay | Admitting: INTERNAL MEDICINE

## 2014-07-26 VITALS — BP 105/63 | HR 93 | Temp 97.7°F | Resp 18 | Ht 76.0 in | Wt 270.5 lb

## 2014-07-26 DIAGNOSIS — I10 Essential (primary) hypertension: Secondary | ICD-10-CM | POA: Insufficient documentation

## 2014-07-26 DIAGNOSIS — E119 Type 2 diabetes mellitus without complications: Secondary | ICD-10-CM | POA: Insufficient documentation

## 2014-07-26 DIAGNOSIS — Z8379 Family history of other diseases of the digestive system: Secondary | ICD-10-CM | POA: Insufficient documentation

## 2014-07-26 DIAGNOSIS — C182 Malignant neoplasm of ascending colon: Secondary | ICD-10-CM | POA: Insufficient documentation

## 2014-07-26 DIAGNOSIS — K219 Gastro-esophageal reflux disease without esophagitis: Secondary | ICD-10-CM | POA: Insufficient documentation

## 2014-07-26 DIAGNOSIS — Z801 Family history of malignant neoplasm of trachea, bronchus and lung: Secondary | ICD-10-CM | POA: Insufficient documentation

## 2014-07-26 DIAGNOSIS — Z8249 Family history of ischemic heart disease and other diseases of the circulatory system: Secondary | ICD-10-CM | POA: Insufficient documentation

## 2014-07-26 DIAGNOSIS — Z9049 Acquired absence of other specified parts of digestive tract: Secondary | ICD-10-CM

## 2014-07-26 DIAGNOSIS — Z7901 Long term (current) use of anticoagulants: Secondary | ICD-10-CM | POA: Insufficient documentation

## 2014-07-26 DIAGNOSIS — Q8740 Marfan's syndrome, unspecified: Secondary | ICD-10-CM | POA: Insufficient documentation

## 2014-07-26 DIAGNOSIS — K7460 Unspecified cirrhosis of liver: Secondary | ICD-10-CM

## 2014-07-26 DIAGNOSIS — E039 Hypothyroidism, unspecified: Secondary | ICD-10-CM | POA: Insufficient documentation

## 2014-07-26 DIAGNOSIS — Z952 Presence of prosthetic heart valve: Secondary | ICD-10-CM | POA: Insufficient documentation

## 2014-07-26 DIAGNOSIS — C189 Malignant neoplasm of colon, unspecified: Secondary | ICD-10-CM

## 2014-07-26 DIAGNOSIS — Z5181 Encounter for therapeutic drug level monitoring: Secondary | ICD-10-CM

## 2014-07-26 DIAGNOSIS — Z09 Encounter for follow-up examination after completed treatment for conditions other than malignant neoplasm: Secondary | ICD-10-CM

## 2014-07-26 DIAGNOSIS — I509 Heart failure, unspecified: Secondary | ICD-10-CM | POA: Insufficient documentation

## 2014-07-26 DIAGNOSIS — I4891 Unspecified atrial fibrillation: Secondary | ICD-10-CM | POA: Insufficient documentation

## 2014-07-26 LAB — BLOOD CELL COUNT W/DIFF - CANCER CENTER
BASOPHILS: 1 %
BASOS ABS: 0.097 10*3/uL (ref 0.000–0.200)
EOS ABS: 0.173 10*3/uL (ref 0.000–0.500)
EOS ABS: 0.173 THOU/uL (ref 0.000–0.500)
EOSINOPHIL: 2 %
HCT: 31.2 % — ABNORMAL LOW (ref 36.7–47.0)
HGB: 9.8 g/dL — ABNORMAL LOW (ref 12.5–16.3)
HGB: 9.8 g/dL — ABNORMAL LOW (ref 12.5–16.3)
LYMPHOCYTES: 14 %
LYMPHS ABS: 1.031 THOU/uL (ref 1.000–4.800)
MCH: 25.4 pg — ABNORMAL LOW (ref 27.4–33.0)
MCHC: 31.5 g/dL — ABNORMAL LOW (ref 32.5–35.8)
MCV: 80.7 fL (ref 78–100)
MONOCYTES: 8 %
MONOS ABS: 0.563 THOU/uL (ref 0.300–1.000)
MPV: 7.7 fL (ref 7.5–11.5)
PLATELET COUNT (AUTO): 434 THOU/uL (ref 140–450)
PLATELET COUNT: 434 10*3/uL (ref 140–450)
PMN ABS (AUTO): 5.321 10*3/uL (ref 1.5–7.7)
PMN ABS: 5.321 THOU/uL (ref 1.500–7.700)
PMN'S: 75 %
RBC: 3.87 MIL/uL — ABNORMAL LOW (ref 4.06–5.63)
RDW: 19.4 % — ABNORMAL HIGH (ref 12.0–15.0)
WBC: 7.2 THOU/uL (ref 3.5–11.0)

## 2014-07-26 LAB — ALT (SGPT): ALT (SGPT): 14 U/L (ref <55)

## 2014-07-26 LAB — BASIC METABOLIC PANEL
ANION GAP: 9 mmol/L (ref 4–13)
BUN/CREAT RATIO: 11 (ref 6–22)
BUN: 15 mg/dL (ref 8–25)
CALCIUM: 9.4 mg/dL (ref 8.5–10.4)
CARBON DIOXIDE: 29 mmol/L (ref 22–32)
CHLORIDE: 96 mmol/L (ref 96–111)
CREATININE: 1.36 mg/dL — ABNORMAL HIGH (ref 0.62–1.27)
ESTIMATED GLOMERULAR FILTRATION RATE: 56 mL/min/{1.73_m2} — ABNORMAL LOW (ref >59)
GLUCOSE,NONFAST: 123 mg/dL (ref 65–139)
POTASSIUM: 4.9 mmol/L (ref 3.5–5.1)
SODIUM: 134 mmol/L — ABNORMAL LOW (ref 136–145)

## 2014-07-26 LAB — PHOSPHORUS: PHOSPHORUS: 3.8 mg/dL (ref 2.3–4.0)

## 2014-07-26 LAB — BILIRUBIN, TOTAL/CONJ
BILIRUBIN, TOTAL: 0.7 mg/dL (ref 0.3–1.3)
BILIRUBIN,CONJUGATED: 0.3 mg/dL — ABNORMAL HIGH (ref <0.3)

## 2014-07-26 LAB — ALK PHOS (ALKALINE PHOSPHATASE): ALKALINE PHOSPHATASE: 79 U/L (ref <150)

## 2014-07-26 LAB — PT/INR: INR: 3.09 — ABNORMAL HIGH (ref 0.80–1.20)

## 2014-07-26 LAB — CARCINOEMBRYONIC ANTIGEN: CARCINOEMBRYONIC AG: 1.9 ng/mL (ref <5.0)

## 2014-07-26 LAB — AST (SGOT): AST (SGOT): 20 U/L (ref 8–48)

## 2014-07-26 NOTE — Nurses Notes (Signed)
New pt assessment completed. A. Armarion Greek, RN, OCN.

## 2014-07-26 NOTE — Cancer Center Note (Signed)
REASON FOR CONSULTATION: pT2N0 Stage I colon cancer    HISTORY OF PRESENTING ILLNESS:  A 64 yr old male with Hx of Marfans, mechanical aortic valve (1999) (on anticoaggulation), aFib (s/p cardioversion), DM, hypothyroidism who presented to [facility] w/ bloody diarrhea, supratherapeutic INR and hypotension.Colonoscopy remarkable for large mass along the 1st fold of the ascending colon concerning for malignancy. S/P laproscopic right hemicolectomy 06/15/2014. His detailed oncological history is as follows  April 2016-Large right hydrocele that was drained. He gained 60 lbs of water weight post-operatively and was put on lasix. There was concern for infection too and he was started on antibiotocs as well.   Jul 09, 2014. He presented with acute onset of rectal bleeding with bloody diarrhea and generalized weakness. He has been on anticoagulation for chronic atrial fibrillation aand St Jude metal valve replacement in 1999.  His INR shot up to 10.6 at that time from antibiotics & coumadin interaction  Jul 10, 2014, Colonoscopy was performed. It showed large mass on 1st fold rigth colon covering 1/3wall circumference. Clean, superficial ulcer overlying mass. No active bleeding on initial inspection. Multiple biopsies obtained with cold biopsy forceps. Pathology of ascending colon mass revealed adenocarcinoma  Jun 15, 2014. He underwent laproscopic right hemicolectomy. Pathology revealed moderately differentiated adenocarcinoma, resection margins free, 0/30 nodes, LVI neg, perineural invasion neg. pT2N0    He said he gained weight during hospitalization and then has been on lasix, now has lost 20 lbs in 2 weeks. Appetite is fair. He c/o some fatigue. He follows with a urologist and cardiologist in [place].    REVIEW OF SYSTEMS:  General: (-) pain. (-) fevers (-) chills. (-) weight loss. (+) fatigue.  Lymphatic: (-) palpable masses. (-) night sweats.  Heme: (-) easy bruising (-) bleeding. (-) recurrent infections.   HEENT.  (-) vision changes (-) hearing changes. (-) dysphagia. (-) sore throat.   Heart: (+) mechanical valve (-) chest pain. (-) palpitation. (-) orthopnea. (-) LE edema.   Lungs: (-) dyspnea (on exertion) (-) hemoptysis. (-) cough.   Abdomen: (-) poor appetite. (-) abdominal pain. (-) nausea (-) vomiting. (-) diarrhea. (-) constipation.   GU: (-) dysuria (-) Urgency. (-) Hematuria.   MS. (-) joint pain (-) ext swelling. (-) Back pain.   Dermatologic: (-) rashes. (-) pruritus.   Psychiatric: (-) Depression. (-) anxiety. (-) insomnia.   Neurologic: (-) headaches. (-) neuropathy. (-) weakness. (-) memory problems.  Endocrine:(+) hypothyroidism  Other review of systems negative.       Past Medical History  Current Outpatient Prescriptions   Medication Sig    acetaminophen (TYLENOL) 325 mg Oral Tablet Take 2 Tabs (650 mg total) by mouth Every 4 hours as needed    carvedilol (COREG) 12.5 mg Oral Tablet Take 1 Tab (12.5 mg total) by mouth Twice daily with food    docusate sodium (COLACE) 100 mg Oral Capsule Take 1 Cap (100 mg total) by mouth Twice daily    furosemide (LASIX) 40 mg Oral Tablet Take 1 Tab (40 mg total) by mouth Once a day (Patient taking differently: Take 40 mg by mouth Twice daily )    levothyroxine (SYNTHROID) 200 mcg Oral Tablet take 200 mcg by mouth Once a day.      MAGNESIUM CARBONATE ORAL Take by mouth Once a day    POTASSIUM GLUCONATE ORAL Take by mouth Once a day    spironolactone (ALDACTONE) 50 mg Oral Tablet Take 1 Tab (50 mg total) by mouth Once a day  warfarin (COUMADIN) 5 mg Oral Tablet Take 1 Tab (5 mg total) by mouth Every evening (Patient taking differently: Take 10 mg by mouth Every evening )     Allergies   Allergen Reactions    Allegra [Fexofenadine]  Other Adverse Reaction (Add comment)     "retain fluid"     Past Medical History   Diagnosis Date    Marfan syndrome     Congestive heart failure     Diabetes mellitus      type ii diet controlled    Wears glasses      Thyroid disease      hypothyroid    Reflux     Hydrocele     Hemorrhage of gastrointestinal tract 5/16    A-fib      Chronic anti-coagulant therapy    Transfusion history      s/p Multiple units PRC/FFP this adm.    Syncope     Hemorrhagic shock     Acute on chronic renal failure     Muscle weakness      Generalized    Hypertension 07/10/2014         Past Surgical History   Procedure Laterality Date    Hx heart valve surgery  1999     st judes     Hx upper endoscopy  07/09/14    Hx tonsil and adenoidectomy      Hx tonsillectomy           Family History   Problem Relation Age of Onset    Hypertension Mother     Lung Cancer Father     Cirrhosis Brother          History     Social History    Marital Status: Married     Spouse Name: N/A    Number of Children: N/A    Years of Education: N/A     Social History Main Topics    Smoking status: Never Smoker     Smokeless tobacco: Never Used    Alcohol Use: No    Drug Use: No    Sexual Activity: Not on file     Other Topics Concern    Not on file     Social History Narrative     Married, lives with wife,. Has 4 children. One daughter has Marfan's syndrome. He is retired, worked as Government social research officer in [place] appearance: Awake, alert and in no acute distress.  HEENT: Normocephalic, atraumatic, EOMI, sclera anicteric, oropharynx moist and pink, neck supple, trachea midline .  Respiratory: Clear to ausculatation bilaterally,no wheezes,crackles, rhonchi or rales.  Cardiovascular: RRR, no murmurs, rubs, gallops, no JVD, 2+ radial pulses bilaterally.  Gastrointestinal: Soft nontender, nondistended, no abdominal masses noted.  Lymphatics: No palpable lymphadenopathy.  Musculoskeletal: 5/5 strength in the upper and and 4/5 in lower extremities.  Skin: Non jaundiced, no rashes.  Neurologic: Cranial nerves II-XII grossly intact.  Psychiatric: Normal affect, good insight.    LABS  Results for Kenneth Weeks, Kenneth Weeks (MRN  [id number]) as of 08/15/2014 18:09   Ref. Range 07/26/2014 14:14   WBC Latest Ref Range: 3.5-11.0 THOU/uL 7.2   HGB Latest Ref Range: 12.5-16.3 g/dL 9.8 (L)   HCT Latest Ref Range: 36.7-47.0 % 31.2 (L)   PLATELET COUNT Latest Ref Range: 140-450 THOU/uL 434   RBC Latest Ref Range: 4.06-5.63 MIL/uL 3.87 (L)   MCV Latest Ref Range: 78-100 fL 80.7   MCHC  Latest Ref Range: 32.5-35.8 g/dL 31.5 (L)   MCH Latest Ref Range: 27.4-33.0 pg 25.4 (L)   RDW Latest Ref Range: 12.0-15.0 % 19.4 (H)   MPV Latest Ref Range: 7.5-11.5 fL 7.7   PLATELET COUNT Latest Ref Range: 140-450 THOU/uL 434   PMN'S Latest Units: % 75   LYMPHOCYTES Latest Units: % 14   EOSINOPHIL Latest Units: % 2   MONOCYTES Latest Units: % 8   BASOPHILS Latest Units: % 1   PMN ABS Latest Ref Range: 1.500-7.700 THOU/uL 5.321   LYMPHS ABS Latest Ref Range: 1.000-4.800 THOU/uL 1.031   EOS ABS Latest Ref Range: 0.000-0.500 THOU/uL 0.173   MONOS ABS Latest Ref Range: 0.300-1.000 THOU/uL 0.563   BASOS ABS Latest Ref Range: 0.000-0.200 THOU/uL 0.097   PMN ABS Latest Ref Range: 1.5-7.7 THOU/uL 5.321   SODIUM Latest Ref Range: 136-145 mmol/L 134 (L)   POTASSIUM Latest Ref Range: 3.5-5.1 mmol/L 4.9   CHLORIDE Latest Ref Range: 96-111 mmol/L 96   CARBON DIOXIDE Latest Ref Range: 22-32 mmol/L 29   BUN Latest Ref Range: 8-25 mg/dL 15   CREATININE Latest Ref Range: 0.62-1.27 mg/dL 1.36 (H)   GLUCOSE,NONFAST Latest Ref Range: 65-139 mg/dL 123   ANION GAP Latest Ref Range: 4-13 mmol/L 9   BUN/CREAT RATIO Latest Ref Range: 6-22  11   ESTIMATED GLOMERULAR FILTRATION RATE Latest Ref Range: >59 ml/min/1.40m2 56 (L)   CALCIUM Latest Ref Range: 8.5-10.4 mg/dL 9.4   PHOSPHORUS Latest Ref Range: 2.3-4.0 mg/dL 3.8   BILIRUBIN, TOTAL Latest Ref Range: 0.3-1.3 mg/dL 0.7   BILIRUBIN,CONJUGATED Latest Ref Range: <0.3 mg/dL 0.3 (H)   AST (SGOT) Latest Ref Range: 8-48 U/L 20   ALT (SGPT) Latest Ref Range: <55 U/L 14   ALKALINE PHOSPHATASE Latest Ref Range: <150 U/L 79   CARCINOEMBRYONIC AG Latest  Ref Range: <5.0 ng/mL 1.9         PATHOLOGY REVIEW  Final Pathologic Diagnosis    RIGHT COLON, HEMICOLECTOMY:    - Moderately differentiated adenocarcinoma. (See Cancer Case Summary.)    - Resection margins are free of neoplasm.    - Thirty lymph nodes identified, all negative for malignancy (0/30).    - Pathologic staging: pT2 N0.    - Appendix with no diagnostic abnormalities recognized.          CAP CANCER CASE SUMMARY: COLON    Specimen: Terminal ileum, cecum, appendix and ascending colon.  Procedure: Right hemicolectomy.  Tumor site: Right ascending colon.  Tumor size: Greatest dimension: 3.5 cm.  Macroscopic tumor perforation: Not identified.  Histologic type: Adenocarcinoma.  Histologic grade: Low-grade (well-differentiated to moderately differentiated).  Microscopic tumor extension: Tumor invades muscularis propria.  Margins: All margins uninvolved by invasive carcinoma.    Distance of invasive carcinoma from closest margin: 5.5 cm from proximal  margin.  Lymph-vascular invasion: Not identified.  Perineural invasion: Not identified.  Tumor deposits: Not identified.  Pathologic staging: pT2, pN0.    Number of lymph nodes examined: 24.    Number of lymph nodes involved: 0.  Additional pathologic findings: None identified.        IMAGING  CT 6/1/206  IMPRESSION:   1.Small bilateral pleural effusions. 2 pulmonary nodules, largest measuring 4 mm. Followup with CT chest in 6-12 months is recommended.  2. Cholelithiasis with mild gallbladder wall thickening. If there is concern for acute cholecystitis further evaluation with ultrasound is recommended.  3. Patient's known cecal mass is not visualized. No evidence of obstruction. Diverticulosis without evidence for  complicated disease.  4. Mild nonspecific mesenteric stranding and minimal free fluid within the pelvis.    ASSESSMENT AND PLAN  A 64 yr old male with Hx of Marfans, mechanical  aortic valve (1999) (on anticoaggulation), aFib (s/p cardioversion), DM, hypothyroidism who presented to [facility] w/ bloody diarrhea, supratherapeutic INR and hypotension.Colonoscopy remarkable for large mass along the 1st fold of the ascending colon concerning for malignancy. S/P laproscopic right hemicolectomy 06/15/2014. Pathology revealed 3.5 cm well differentiated to moderately differentiated adenocarcinoma, resection margins free, 0/30 nodes, LVI neg, perineural invasion neg. PT2N0, Stage 1  The vast majority of studies exploring the benefits of posttreatment surveillance have been conducted in patients with resected stage II or III disease. Whether or not intensive posttreatment surveillance is needed after resection of a stage I colon cancer is controversial, and the guidelines differ on this point. I would recommend periodic history and physical examination,. CEA testing, CT scanning and endoscopic surveillance, extrapolating from stage II and III data.    -Check labs today including CEA.  -Repeat colonoscopy in 6 months as per GI.  -Plan for CT C/A/P scan in 6-12 months (2 pulmonary nodules, 45mm RLL and 2 mm RUL)  -Follow up every 3 months for 1st 2 years and then every 6 months during years 3, 4 and 5.    All questions were answered to patient's satisfaction and he was encouraged to call our office with any concerns.

## 2014-07-26 NOTE — Progress Notes (Addendum)
Patient: Kenneth Weeks  D.O.B.: Aug 17, 1950  MRN# [id number]  Date of Service: 07/26/2014    Subjective:  I am seeing Kenneth Weeks today for a post-operative visit at the [facility].  He is a 64 y.o. male who is ~2 weeks s/p lap right hemicolectomy for moderately differentiated colon adenocarcinoma.  His post-op course was uneventful.  Patient reports feeling well.  He is tolerating a diet, having BMs and passing gas, and his pain is controlled.  He denies any blood per rectum, drainage from his surgical incisions, f/c, emesis.  He does endorse some nausea with eating certain foods as well as occasional dizziness.          Objective:    Physical Exam:    On exam, the abdomen is soft, non-distended, non-tender; there is a small palpable mass at superior pole of incision which is likely scar tissue; the incisions are healing well w/o evidence of drainage or erythema.  Lungs are CTAB, respirations are non-labored, and heart is RRR with prominent click from mechanical valve.  Strength is good in all extremities.  CNs are grossly intact and there are no apparent neurologic deficits.      Pathology:  RIGHT COLON, HEMICOLECTOMY:    - Moderately differentiated adenocarcinoma. (See Cancer Case Summary.)    - Resection margins are free of neoplasm.    - Thirty lymph nodes identified, all negative for malignancy (0/30).    - Pathologic staging: pT2 N0.    - Appendix with no diagnostic abnormalities recognized.        CAP CANCER CASE SUMMARY: COLON  Specimen: Terminal ileum, cecum, appendix and ascending colon.  Procedure: Right hemicolectomy.  Tumor site: Right ascending colon.  Tumor size: Greatest dimension: 3.5 cm.  Macroscopic tumor perforation: Not identified.  Histologic type: Adenocarcinoma.  Histologic grade: Low-grade (well-differentiated to moderately differentiated).  Microscopic tumor extension: Tumor invades muscularis propria.  Margins: All margins  uninvolved by invasive carcinoma.    Distance of invasive carcinoma from closest margin: 5.5 cm from proximal  margin.  Lymph-vascular invasion: Not identified.  Perineural invasion: Not identified.  Tumor deposits: Not identified.  Pathologic staging: pT2, pN0.    Number of lymph nodes examined: 69.    Number of lymph nodes involved: 0.  Additional pathologic findings: None identified.      Assessment/Plan:  My assessment is a 28 YoM doing well s/p lap right hemicolectomy for Stage IB colon adenocarcinoma.  Patient has received definitive management for his colon cancer with surgical resection.  He will not require chemo or radiation therapy.  He saw Dr. Landry Corporal earlier today who agrees.  The plan is to have patient undergo a surveillance colonoscopy in 6 months with GI to evaluate for any recurrent or additional disease.  He will follow up with [place] in 6 months for monitoring of his cirrhosis with a RUQ [place], AFP, and hepatic function panel.  He will also get an INR drawn today for monitoring of his Warfarin by his medicine doctor.        Doyne Keel, MD  07/26/2014, 14:59    I saw and examined the patient.  I reviewed the resident's note.  I agree with the findings and plan of care as documented in the resident's note.  Any exceptions/additions are edited/noted.    Gwinda Passe, MD 07/26/2014, 15:20

## 2014-07-26 NOTE — Progress Notes (Signed)
07/26/14 1600   [facility] PT/INR   Date INR drawn 07/26/14   INR 3.1   New warfarin dosage Keep the same   Comments Check INR in one week   Initials RTM   Anticoag Indication Mehcanical valve   Goal INR 2.5-3.5

## 2014-07-26 NOTE — Addendum Note (Signed)
Encounter addended by: Gwinda Passe, MD on: 07/26/2014  3:31 PM<BR>     Documentation filed: Problem List, LOS Section, Notes Section, Letters, [facility] Staging

## 2014-07-27 NOTE — Addendum Note (Signed)
Encounter addended by: Theresa Mulligan, RN on: 07/27/2014 11:45 AM<BR>     Documentation filed: Clinical Notes

## 2014-07-27 NOTE — Nurses Notes (Signed)
Called pt per Dr. Landry Corporal, she wanted to make sure pt received her message yesterday, pt stated he did and will discuss with PCP Dr. Anabel Bene concerning lasix as Dr. Landry Corporal wanted pt to take one lasix daily and make appt with PCP ASAP, pt stated he will be drinking more fluids today, and plans to call PCP for appt.,  pt creatinine yesterday was 1.36. Pt. Verbalized an understanding. Fabio Bering, RN, OCN.

## 2014-07-30 ENCOUNTER — Ambulatory Visit (INDEPENDENT_AMBULATORY_CARE_PROVIDER_SITE_OTHER): Payer: Medicare Other | Admitting: INTERNAL MEDICINE

## 2014-07-30 ENCOUNTER — Ambulatory Visit (INDEPENDENT_AMBULATORY_CARE_PROVIDER_SITE_OTHER): Payer: Self-pay | Admitting: INTERNAL MEDICINE

## 2014-07-30 ENCOUNTER — Encounter (INDEPENDENT_AMBULATORY_CARE_PROVIDER_SITE_OTHER): Payer: Self-pay | Admitting: INTERNAL MEDICINE

## 2014-07-30 ENCOUNTER — Ambulatory Visit
Admission: RE | Admit: 2014-07-30 | Discharge: 2014-07-30 | Disposition: A | Discharge status: Home / Self Care | Payer: Medicare Other | Source: Ambulatory Visit | Attending: INTERNAL MEDICINE | Admitting: INTERNAL MEDICINE

## 2014-07-30 ENCOUNTER — Encounter (INDEPENDENT_AMBULATORY_CARE_PROVIDER_SITE_OTHER): Payer: Self-pay | Admitting: Surgery

## 2014-07-30 VITALS — BP 100/60 | HR 72 | Temp 98.4°F | Resp 12 | Ht 75.0 in | Wt 266.0 lb

## 2014-07-30 DIAGNOSIS — Z952 Presence of prosthetic heart valve: Secondary | ICD-10-CM

## 2014-07-30 DIAGNOSIS — C189 Malignant neoplasm of colon, unspecified: Secondary | ICD-10-CM

## 2014-07-30 DIAGNOSIS — Z954 Presence of other heart-valve replacement: Secondary | ICD-10-CM | POA: Insufficient documentation

## 2014-07-30 LAB — PT/INR
INR: 3.25
PROTHROMBIN TIME: 36.2 s (ref 9.4–12.5)

## 2014-07-30 NOTE — Progress Notes (Unsigned)
Pt notified Per Dr. Radene Ou on 07/30/2014. Pt has F/U lab appt scheduled for 08/10/2014.  Charlyne Petrin, LPN  2/99/2426, 83:41

## 2014-07-30 NOTE — Progress Notes (Signed)
07/30/14 1500   [facility] PT/INR   Date INR drawn 07/30/14   Current warfarin dosage 10mg  daily   INR 3.3   New warfarin dosage Go to alternating 7.5mg  with 10mg  every other day   Comments Check INR in two weeks   Initials RTM   Anticoag Indication Mechanical valve   Goal INR 2.5-3.5

## 2014-07-30 NOTE — Progress Notes (Unsigned)
Pt notified of results and verbalized understanding. Pt made F/U lab appt for 08/10/2014.

## 2014-07-30 NOTE — Progress Notes (Signed)
[facility]   [address]   [place],  [address]  [phone]  [phone]    Chronic Problems Office Visit     SUBJECTIVE   Kenneth Weeks is a 64 y.o. male here for follow-up after a huge hospitalization for a GI bleed that was secondary to a colon tumor. He had a partial colectomy done with a direct anastomosis.     He says that he is not having pain a the surgical site. He is moving his bowels.   His surgical margins were clear and his lymph nodes were all negative.     Back on coumadin daily      Vision -- glasses    Hearing -- no major changes in hearing     Sources of pain which are new -- abdomen is actually not that painful overall     Chewing/swallowing -- no problem chewing or swallowing     Contact with other providers -- huge amount of doctors at [facility]     Medications reviewed by me personally at this time:   Warfarin 7.71m daily    Synthroid 2029m daily   Lasix 40 mg BID   Spironolactone 50 mg daily in the AM   Coreg 12.34m69mID   Potassium daily   Warfarin 10 mg daily       Physical activities   He is walking and doing okay   No lifting anything great than 10-lbs for now       Since our last visit together, has experienced the following healthcare interactions:     ER -- no visits   Urgent care visits -- nothing   Accidents with a car -- nothing of the sort   Injuries at home which resulted in injury -- no new problems   Medications from other providers -- nothing new   Surgeries or other procedures --partial colectomy       Changes in family history since we last met -- no new illnesses in family members that could affect       Changes in social history since we last met -- living in the same location       Medications reviewed    Current outpatient prescriptions:     acetaminophen (TYLENOL) 325 mg Oral Tablet, Take 2 Tabs (650 mg total) by mouth Every 4 hours as needed, Disp: , Rfl: 0    carvedilol (COREG) 12.5 mg Oral Tablet, Take 1 Tab (12.5 mg  total) by mouth Twice daily with food, Disp: 60 Tab, Rfl: 5    docusate sodium (COLACE) 100 mg Oral Capsule, Take 1 Cap (100 mg total) by mouth Twice daily, Disp: , Rfl: 0    furosemide (LASIX) 40 mg Oral Tablet, Take 1 Tab (40 mg total) by mouth Once a day (Patient taking differently: Take 40 mg by mouth Twice daily ), Disp: , Rfl: 0    levothyroxine (SYNTHROID) 200 mcg Oral Tablet, take 200 mcg by mouth Once a day.  , Disp: , Rfl:     MAGNESIUM CARBONATE ORAL, Take by mouth Once a day, Disp: , Rfl:     POTASSIUM GLUCONATE ORAL, Take by mouth Once a day, Disp: , Rfl:     spironolactone (ALDACTONE) 50 mg Oral Tablet, Take 1 Tab (50 mg total) by mouth Once a day, Disp: 30 Tab, Rfl: 5    warfarin (COUMADIN) 5 mg Oral Tablet, Take 1 Tab (5 mg total) by mouth Every evening (Patient taking differently: Take 10  mg by mouth Every evening ), Disp: 30 Tab, Rfl: 0      Review of Systems -     Constitutional: negative for fevers,  Eyes: no new blurry  ENT: no acute hearing loss   Cardiovascular: no new chest pain   Respiratory: no new shortness of breath   GI: no new vomiting or diarrhea   GU: swollen scrotum   Musculoskeletal: no new aches and pains  Neurological: no new seizure or headaches   Emotional/Pscychiatric: no depression or anxiety  Skin: stasis changes in BL legs   Endocrine: no new diabetes   Hematologic/Lymphatic: no DVT or bleeding issues      History   Smoking status    Never Smoker    Smokeless tobacco    Never Used       OBJECTIVE:  BP 100/60 mmHg   Pulse 72   Temp(Src) 36.9 C (98.4 F)   Resp 12   Ht 1.905 m ('6\' 3"' )   Wt 120.657 kg (266 lb)   BMI 33.25 kg/m2    Counseling done about the following face-to-face:     Labs reviewed by me   Drug-drug interactions considered and reviewed by me     Physical Exam     General -- alert   HEENT -- PERRL, EOMI   Heart -- irregular rate and rhythm   Neck -- Thyroid is normal sized  Lungs -- clear and no wheezing  Abdomen -- soft and tender; laposcopic surgical  scars on the abdomen; healed   Neuro -- normal gait and tone; strength is 5/5  Skin -- no rashes noted   Psych -- pleasant and smiling   Vascular -- legs are less swollen than before         Labs -- reviewed by me in the EMR   May 2016   Creatinine 1.27   Glucose 136 (not fasting)    LFT's okay     March 2016    Creatinine 1.02   TSH 1.23   LFT's okay       Medications -- reviewed by me; drug-drug interactions considered by me      Counseling provided directly face-to-face about: more exercise on a regular basis       ASSESSMENT AND PLAN:  S/P laporoscopic right hemicolectomy with stapled anastomosis -- doing fanasticallly well: no pain; moving his bowels. He is following his lifting restrictions      Worsening edema: CHF secondary to atrial fibrilation - volume status is good overall     Warfarin management for mechanical heart valve -- check INR today     Atrial fibrilation -- no evidence  Symptoms burden     Hypothyroidism -- on replacement     Screening for Diabetes -- update in 2016     High cholesterol screening -- check at next labs     Obesity -- we talked about this today     Vision -- glasses; no changes     Hearing issues -- not limited     Exercise --  He needs to get more regular exercise     Need for HIV or hepatitis C screening -- no reason to screen     Tobacco abuse screening -- none at all     Alcohol and drug abuse screening -- none at all     Depression/anxiety screening -- he denies     Screening for colorectal cancer -- he is not due for screening  Immunizations -- I recommend a yearly flu shot       DISPOSITION:   Check INR today   Continue current medications   See the oncologist in several months     Follow up with in 8 weeks

## 2014-08-07 ENCOUNTER — Other Ambulatory Visit: Payer: Medicare Other

## 2014-08-08 ENCOUNTER — Ambulatory Visit
Admission: RE | Admit: 2014-08-08 | Discharge: 2014-08-08 | Disposition: A | Discharge status: Home / Self Care | Payer: Medicare Other | Source: Ambulatory Visit | Attending: INTERNAL MEDICINE | Admitting: INTERNAL MEDICINE

## 2014-08-08 DIAGNOSIS — Z7901 Long term (current) use of anticoagulants: Secondary | ICD-10-CM | POA: Insufficient documentation

## 2014-08-08 LAB — PT/INR
INR: 4.55
PROTHROMBIN TIME: 50.4 s (ref 9.4–12.5)

## 2014-08-09 ENCOUNTER — Ambulatory Visit (INDEPENDENT_AMBULATORY_CARE_PROVIDER_SITE_OTHER): Payer: Self-pay

## 2014-08-09 NOTE — Progress Notes (Unsigned)
Pt was notified per Dr's note and verbalized understanding. Pt made F/U lab appt for 08/16/2014.  Charlyne Petrin, LPN  05/17/6806, 81:10

## 2014-08-09 NOTE — Progress Notes (Signed)
08/09/14 0600   [facility] PT/INR   Date INR drawn 08/08/14   Current warfarin dosage warfarin 5 mg. tabs - 10 mg. alt with 7.5 mg. every other day   INR 4.55   New weekly dosgae Change to 7.5 mg. daily   Comments Reck INR in 1 wk.    Goal INR 2.5 - 3.5

## 2014-08-09 NOTE — Progress Notes (Unsigned)
Patient to be notified that his INR is still a bit too high.  Please confirm that current warfarin dose is 5 mg. Tabs - #2 tabs alternating with #1.5 tabs every other day.  (Let me know if that is not correct.)  If correct, please now change to #1.5 tabs (= 7.5 mg.) every day.  Recheck INR in 1 week.

## 2014-08-10 ENCOUNTER — Other Ambulatory Visit: Payer: Self-pay

## 2014-08-15 ENCOUNTER — Ambulatory Visit
Admission: RE | Admit: 2014-08-15 | Discharge: 2014-08-15 | Disposition: A | Discharge status: Home / Self Care | Payer: Medicare Other | Source: Ambulatory Visit | Attending: INTERNAL MEDICINE | Admitting: INTERNAL MEDICINE

## 2014-08-15 DIAGNOSIS — Z7901 Long term (current) use of anticoagulants: Principal | ICD-10-CM | POA: Insufficient documentation

## 2014-08-15 LAB — PT/INR: INR: 2.86

## 2014-08-16 ENCOUNTER — Ambulatory Visit (INDEPENDENT_AMBULATORY_CARE_PROVIDER_SITE_OTHER): Payer: Self-pay | Admitting: INTERNAL MEDICINE

## 2014-08-16 ENCOUNTER — Other Ambulatory Visit: Payer: Self-pay

## 2014-08-16 NOTE — Progress Notes (Signed)
08/16/14 0900   [facility] PT/INR   INR 2.9   Comments Keep the warfarin the same   Follow up/ Next visit Check INR in two weeks   Initials RTM   Anticoag Indication Mechanical valve   Goal INR 2.5-3.5

## 2014-08-16 NOTE — Progress Notes (Signed)
Pt.notified

## 2014-08-27 ENCOUNTER — Other Ambulatory Visit (INDEPENDENT_AMBULATORY_CARE_PROVIDER_SITE_OTHER): Payer: Self-pay | Admitting: INTERNAL MEDICINE

## 2014-08-27 MED ORDER — LEVOTHYROXINE 200 MCG TABLET
200.0000 ug | ORAL_TABLET | Freq: Every day | ORAL | Status: DC
Start: 2014-08-27 — End: 2015-07-21

## 2014-08-29 ENCOUNTER — Ambulatory Visit (INDEPENDENT_AMBULATORY_CARE_PROVIDER_SITE_OTHER): Payer: Self-pay | Admitting: INTERNAL MEDICINE

## 2014-08-29 ENCOUNTER — Ambulatory Visit: Payer: Medicare Other | Attending: INTERNAL MEDICINE

## 2014-08-29 DIAGNOSIS — Z7901 Long term (current) use of anticoagulants: Secondary | ICD-10-CM | POA: Insufficient documentation

## 2014-08-29 LAB — PT/INR
INR: 3.63
PROTHROMBIN TIME: 41.5 s — ABNORMAL HIGH (ref 9.4–12.5)

## 2014-08-29 NOTE — Progress Notes (Unsigned)
Pt notified of results and verbalized understanding. Pt had no questions or concerns.  Charlyne Petrin, LPN  06/05/8339, 96:22

## 2014-08-29 NOTE — Progress Notes (Signed)
08/29/14 1200   [facility]/INR   Date INR drawn 08/29/14   INR 3.6   New warfarin dosage No changes   New weekly dosgae Keep the same and check in one month   Initials RTM   Anticoag Indication mechanical valve   Goal INR 2.5-3.5

## 2014-09-26 ENCOUNTER — Ambulatory Visit: Payer: Medicare Other | Attending: INTERNAL MEDICINE

## 2014-09-26 ENCOUNTER — Ambulatory Visit (INDEPENDENT_AMBULATORY_CARE_PROVIDER_SITE_OTHER): Payer: Self-pay | Admitting: INTERNAL MEDICINE

## 2014-09-26 DIAGNOSIS — Z5181 Encounter for therapeutic drug level monitoring: Secondary | ICD-10-CM | POA: Insufficient documentation

## 2014-09-26 DIAGNOSIS — Z7901 Long term (current) use of anticoagulants: Secondary | ICD-10-CM | POA: Insufficient documentation

## 2014-09-26 LAB — PT/INR
INR: 3.53
PROTHROMBIN TIME: 40.4 s — ABNORMAL HIGH (ref 9.4–12.5)

## 2014-09-26 NOTE — Progress Notes (Unsigned)
Pt notified of results and verbalized understanding. Pt had no questions or concerns. Scheduled pt/inr apt. For 1:30pm Thursday Sep. 15.

## 2014-09-26 NOTE — Progress Notes (Signed)
09/26/14 1500   [facility] PT/INR   Date INR drawn 09/26/14   INR 3.5   New weekly dosgae Keep the same dose   Comments Check INR in 1 month   Initials RTM   Anticoag Indication heart valve   Goal INR 2.5-3.5

## 2014-09-28 ENCOUNTER — Other Ambulatory Visit: Payer: Self-pay

## 2014-10-17 ENCOUNTER — Encounter (INDEPENDENT_AMBULATORY_CARE_PROVIDER_SITE_OTHER): Payer: Self-pay | Admitting: INTERNAL MEDICINE

## 2014-10-17 ENCOUNTER — Ambulatory Visit (INDEPENDENT_AMBULATORY_CARE_PROVIDER_SITE_OTHER): Payer: Medicare Other | Admitting: INTERNAL MEDICINE

## 2014-10-17 ENCOUNTER — Ambulatory Visit: Payer: Medicare Other | Attending: INTERNAL MEDICINE

## 2014-10-17 ENCOUNTER — Ambulatory Visit (INDEPENDENT_AMBULATORY_CARE_PROVIDER_SITE_OTHER): Payer: Self-pay | Admitting: INTERNAL MEDICINE

## 2014-10-17 VITALS — BP 128/82 | HR 99 | Temp 98.3°F | Ht 75.0 in | Wt 258.0 lb

## 2014-10-17 DIAGNOSIS — Z952 Presence of prosthetic heart valve: Secondary | ICD-10-CM

## 2014-10-17 DIAGNOSIS — Z954 Presence of other heart-valve replacement: Secondary | ICD-10-CM

## 2014-10-17 DIAGNOSIS — I4891 Unspecified atrial fibrillation: Secondary | ICD-10-CM

## 2014-10-17 DIAGNOSIS — C189 Malignant neoplasm of colon, unspecified: Secondary | ICD-10-CM

## 2014-10-17 LAB — PT/INR
INR: 2.71
INR: 2.71
PROTHROMBIN TIME: 30.7 s — ABNORMAL HIGH (ref 9.4–12.5)

## 2014-10-17 MED ORDER — SILDENAFIL 100 MG TABLET
100.00 mg | ORAL_TABLET | ORAL | Status: DC | PRN
Start: 2014-10-17 — End: 2016-01-24

## 2014-10-17 NOTE — Progress Notes (Signed)
10/17/14 1200   [facility]/INR   Date INR drawn 10/17/14   INR 2.7   New warfarin dosage No changes   New weekly dosgae Keep current coumadin dosing   Comments Check INR in one month   Initials RTM   Anticoag Indication mechanical vale   Goal INR 2.5-3.5
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
## 2014-10-18 NOTE — Progress Notes (Signed)
Pt notified of results and verbalized understanding. Pt had no questions or concerns.  One month follow up apt, Made Oct. 6th 9:30

## 2014-10-25 ENCOUNTER — Other Ambulatory Visit: Payer: Self-pay

## 2014-10-26 ENCOUNTER — Encounter (HOSPITAL_BASED_OUTPATIENT_CLINIC_OR_DEPARTMENT_OTHER): Payer: Self-pay | Admitting: Student in an Organized Health Care Education/Training Program

## 2014-10-26 ENCOUNTER — Ambulatory Visit
Admission: RE | Admit: 2014-10-26 | Discharge: 2014-10-26 | Disposition: A | Discharge status: Home / Self Care | Payer: Medicare Other | Source: Ambulatory Visit | Attending: Student in an Organized Health Care Education/Training Program | Admitting: Student in an Organized Health Care Education/Training Program

## 2014-10-26 ENCOUNTER — Ambulatory Visit (HOSPITAL_BASED_OUTPATIENT_CLINIC_OR_DEPARTMENT_OTHER)
Admission: RE | Admit: 2014-10-26 | Discharge: 2014-10-26 | Disposition: A | Discharge status: Home / Self Care | Payer: Medicare Other | Source: Ambulatory Visit | Attending: Student in an Organized Health Care Education/Training Program | Admitting: Student in an Organized Health Care Education/Training Program

## 2014-10-26 VITALS — BP 139/83 | HR 83 | Temp 97.9°F | Resp 18 | Ht 76.0 in | Wt 255.7 lb

## 2014-10-26 DIAGNOSIS — I509 Heart failure, unspecified: Secondary | ICD-10-CM | POA: Insufficient documentation

## 2014-10-26 DIAGNOSIS — Q8740 Marfan's syndrome, unspecified: Secondary | ICD-10-CM | POA: Insufficient documentation

## 2014-10-26 DIAGNOSIS — Z801 Family history of malignant neoplasm of trachea, bronchus and lung: Secondary | ICD-10-CM | POA: Insufficient documentation

## 2014-10-26 DIAGNOSIS — R5383 Other fatigue: Secondary | ICD-10-CM | POA: Insufficient documentation

## 2014-10-26 DIAGNOSIS — Z7901 Long term (current) use of anticoagulants: Secondary | ICD-10-CM | POA: Insufficient documentation

## 2014-10-26 DIAGNOSIS — Z952 Presence of prosthetic heart valve: Secondary | ICD-10-CM | POA: Insufficient documentation

## 2014-10-26 DIAGNOSIS — N433 Hydrocele, unspecified: Secondary | ICD-10-CM

## 2014-10-26 DIAGNOSIS — E119 Type 2 diabetes mellitus without complications: Secondary | ICD-10-CM | POA: Insufficient documentation

## 2014-10-26 DIAGNOSIS — I4891 Unspecified atrial fibrillation: Secondary | ICD-10-CM | POA: Insufficient documentation

## 2014-10-26 DIAGNOSIS — K7460 Unspecified cirrhosis of liver: Secondary | ICD-10-CM

## 2014-10-26 DIAGNOSIS — I10 Essential (primary) hypertension: Secondary | ICD-10-CM | POA: Insufficient documentation

## 2014-10-26 DIAGNOSIS — E039 Hypothyroidism, unspecified: Secondary | ICD-10-CM | POA: Insufficient documentation

## 2014-10-26 DIAGNOSIS — C182 Malignant neoplasm of ascending colon: Secondary | ICD-10-CM | POA: Insufficient documentation

## 2014-10-26 DIAGNOSIS — Z9049 Acquired absence of other specified parts of digestive tract: Secondary | ICD-10-CM | POA: Insufficient documentation

## 2014-10-26 DIAGNOSIS — C189 Malignant neoplasm of colon, unspecified: Secondary | ICD-10-CM

## 2014-10-26 LAB — COMPREHENSIVE METABOLIC PANEL, NON-FASTING
ALBUMIN: 3.6 g/dL (ref 3.4–4.8)
ALKALINE PHOSPHATASE: 75 U/L (ref <150)
ALT (SGPT): 12 U/L (ref <55)
ANION GAP: 5 mmol/L (ref 4–13)
AST (SGOT): 16 U/L (ref 8–48)
BILIRUBIN TOTAL: 1.1 mg/dL (ref 0.3–1.3)
BUN/CREA RATIO: 16 (ref 6–22)
BUN: 17 mg/dL (ref 8–25)
CALCIUM: 8.8 mg/dL (ref 8.5–10.4)
CHLORIDE: 102 mmol/L (ref 96–111)
CO2 TOTAL: 28 mmol/L (ref 22–32)
CREATININE: 1.04 mg/dL (ref 0.62–1.27)
ESTIMATED GFR: >59 mL/min/1.73mˆ2 (ref >59)
GLUCOSE: 73 mg/dL (ref 65–139)
POTASSIUM: 4.3 mmol/L (ref 3.5–5.1)
PROTEIN TOTAL: 7.4 g/dL (ref 6.0–8.0)
SODIUM: 135 mmol/L — ABNORMAL LOW (ref 136–145)

## 2014-10-26 LAB — CBC WITH DIFF
BASOPHIL #: 0.03 x10ˆ3/uL (ref 0.00–0.20)
BASOPHIL %: 1 %
EOSINOPHIL #: 0.01 x10ˆ3/uL (ref 0.00–0.50)
EOSINOPHIL %: 0 %
HCT: 33.7 % — ABNORMAL LOW (ref 36.7–47.0)
HGB: 10.5 g/dL — ABNORMAL LOW (ref 12.5–16.3)
LYMPHOCYTE #: 0.87 x10ˆ3/uL — ABNORMAL LOW (ref 1.00–4.80)
LYMPHOCYTE %: 19 %
MCH: 23.5 pg — ABNORMAL LOW (ref 27.4–33.0)
MCHC: 31.2 g/dL — ABNORMAL LOW (ref 32.5–35.8)
MCV: 75.4 fL — ABNORMAL LOW (ref 78.0–100.0)
MONOCYTE #: 0.44 x10ˆ3/uL (ref 0.30–1.00)
MONOCYTE %: 10 %
MPV: 8.4 fL (ref 7.5–11.5)
NEUTROPHIL #: 3.24 x10ˆ3/uL (ref 1.50–7.70)
NEUTROPHIL %: 71 %
PLATELETS: 174 x10ˆ3/uL (ref 140–450)
RBC: 4.47 x10ˆ6/uL (ref 4.06–5.63)
RDW: 20 % — ABNORMAL HIGH (ref 12.0–15.0)
WBC: 4.6 x10ˆ3/uL (ref 3.5–11.0)

## 2014-10-26 LAB — PHOSPHORUS: PHOSPHORUS: 3.6 mg/dL (ref 2.3–4.0)

## 2014-10-26 LAB — CARCINOEMBRYONIC ANTIGEN: CEA: 1.4 ng/mL (ref <=5.0)

## 2014-10-26 LAB — PLATELETS AND ANC CANCER CENTER
PLATELET COUNT (AUTO): 174 x10(3)/uL (ref 140–450)
PMN ABS (AUTO): 3.24 x10(3)/uL (ref 1.50–7.70)

## 2014-10-26 LAB — MAGNESIUM: MAGNESIUM: 2.1 mg/dL (ref 1.6–2.5)
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
## 2014-11-08 ENCOUNTER — Other Ambulatory Visit (INDEPENDENT_AMBULATORY_CARE_PROVIDER_SITE_OTHER): Payer: Self-pay | Admitting: INTERNAL MEDICINE

## 2014-11-15 ENCOUNTER — Ambulatory Visit (INDEPENDENT_AMBULATORY_CARE_PROVIDER_SITE_OTHER): Payer: Self-pay | Admitting: INTERNAL MEDICINE

## 2014-11-15 ENCOUNTER — Ambulatory Visit: Payer: Medicare Other | Attending: INTERNAL MEDICINE

## 2014-11-15 DIAGNOSIS — Z7901 Long term (current) use of anticoagulants: Secondary | ICD-10-CM | POA: Insufficient documentation

## 2014-11-15 LAB — PT/INR: PROTHROMBIN TIME: 28.7 s — ABNORMAL HIGH (ref 9.4–12.5)

## 2014-11-15 NOTE — Progress Notes (Unsigned)
Pt notified of results and verbalized understanding. Pt had no questions or concerns.

## 2014-11-15 NOTE — Progress Notes (Signed)
11/15/14 1200   [facility] PT/INR   Date INR drawn 11/15/14   INR 2.5   New warfarin dosage Keep the same   New weekly dosgae Check in one month   Initials RTm   Anticoag Indication Valve   Goal INR 2.5-3.5

## 2014-11-30 ENCOUNTER — Other Ambulatory Visit (INDEPENDENT_AMBULATORY_CARE_PROVIDER_SITE_OTHER): Payer: Self-pay | Admitting: INTERNAL MEDICINE

## 2014-12-17 ENCOUNTER — Ambulatory Visit: Payer: Medicare Other | Attending: INTERNAL MEDICINE

## 2014-12-17 DIAGNOSIS — Z7901 Long term (current) use of anticoagulants: Secondary | ICD-10-CM | POA: Insufficient documentation

## 2014-12-17 DIAGNOSIS — Z5181 Encounter for therapeutic drug level monitoring: Secondary | ICD-10-CM | POA: Insufficient documentation

## 2014-12-17 LAB — PT/INR
INR: 2.97
PROTHROMBIN TIME: 33.8 s — ABNORMAL HIGH (ref 9.4–12.5)

## 2014-12-30 ENCOUNTER — Other Ambulatory Visit: Payer: Self-pay

## 2015-01-10 ENCOUNTER — Encounter (INDEPENDENT_AMBULATORY_CARE_PROVIDER_SITE_OTHER): Payer: Self-pay | Admitting: INTERNAL MEDICINE

## 2015-01-10 ENCOUNTER — Ambulatory Visit (INDEPENDENT_AMBULATORY_CARE_PROVIDER_SITE_OTHER): Payer: Medicare Other | Admitting: INTERNAL MEDICINE

## 2015-01-10 VITALS — BP 144/88 | HR 98 | Temp 97.5°F | Ht 75.0 in | Wt 265.0 lb

## 2015-01-10 DIAGNOSIS — I509 Heart failure, unspecified: Secondary | ICD-10-CM

## 2015-01-10 DIAGNOSIS — I4891 Unspecified atrial fibrillation: Secondary | ICD-10-CM

## 2015-01-10 DIAGNOSIS — C189 Malignant neoplasm of colon, unspecified: Secondary | ICD-10-CM

## 2015-01-10 DIAGNOSIS — Z9049 Acquired absence of other specified parts of digestive tract: Secondary | ICD-10-CM

## 2015-01-10 NOTE — Progress Notes (Signed)
BP 144/88 mmHg   Pulse 98   Temp(Src) 36.4 C (97.5 F) (Oral)   Ht 1.905 m (6\' 3" )   Wt 120.203 kg (265 lb)   BMI 33.12 kg/m2   SpO2 99%  Charlyne Petrin, LPN  [id number], [id number]
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
## 2015-01-16 ENCOUNTER — Encounter (INDEPENDENT_AMBULATORY_CARE_PROVIDER_SITE_OTHER): Payer: Medicare Other | Admitting: INTERNAL MEDICINE

## 2015-01-21 ENCOUNTER — Ambulatory Visit
Admission: RE | Admit: 2015-01-21 | Discharge: 2015-01-21 | Disposition: A | Discharge status: Home / Self Care | Payer: Medicare Other | Source: Ambulatory Visit

## 2015-01-21 ENCOUNTER — Encounter (HOSPITAL_COMMUNITY): Payer: Self-pay

## 2015-01-23 ENCOUNTER — Ambulatory Visit (HOSPITAL_COMMUNITY): Payer: Medicare Other | Admitting: Anesthesiology

## 2015-01-23 ENCOUNTER — Ambulatory Visit (HOSPITAL_BASED_OUTPATIENT_CLINIC_OR_DEPARTMENT_OTHER): Payer: Medicare Other | Admitting: Gastroenterology

## 2015-01-23 ENCOUNTER — Inpatient Hospital Stay
Admission: RE | Admit: 2015-01-23 | Discharge: 2015-01-23 | Disposition: A | Discharge status: Home / Self Care | Payer: Medicare Other | Source: Ambulatory Visit | Attending: Gastroenterology | Admitting: Gastroenterology

## 2015-01-23 ENCOUNTER — Encounter (HOSPITAL_COMMUNITY): Payer: Self-pay

## 2015-01-23 ENCOUNTER — Ambulatory Visit (HOSPITAL_BASED_OUTPATIENT_CLINIC_OR_DEPARTMENT_OTHER): Payer: Medicare Other | Admitting: Anesthesiology

## 2015-01-23 ENCOUNTER — Encounter (HOSPITAL_COMMUNITY)
Admission: RE | Disposition: A | Discharge status: Home / Self Care | Payer: Self-pay | Source: Ambulatory Visit | Attending: Gastroenterology

## 2015-01-23 DIAGNOSIS — I509 Heart failure, unspecified: Secondary | ICD-10-CM | POA: Insufficient documentation

## 2015-01-23 DIAGNOSIS — D125 Benign neoplasm of sigmoid colon: Secondary | ICD-10-CM

## 2015-01-23 DIAGNOSIS — K648 Other hemorrhoids: Secondary | ICD-10-CM | POA: Insufficient documentation

## 2015-01-23 DIAGNOSIS — E119 Type 2 diabetes mellitus without complications: Secondary | ICD-10-CM | POA: Insufficient documentation

## 2015-01-23 DIAGNOSIS — Z85038 Personal history of other malignant neoplasm of large intestine: Secondary | ICD-10-CM | POA: Insufficient documentation

## 2015-01-23 DIAGNOSIS — I4891 Unspecified atrial fibrillation: Secondary | ICD-10-CM | POA: Insufficient documentation

## 2015-01-23 DIAGNOSIS — K5730 Diverticulosis of large intestine without perforation or abscess without bleeding: Secondary | ICD-10-CM

## 2015-01-23 DIAGNOSIS — Z08 Encounter for follow-up examination after completed treatment for malignant neoplasm: Secondary | ICD-10-CM | POA: Insufficient documentation

## 2015-01-23 DIAGNOSIS — I10 Essential (primary) hypertension: Secondary | ICD-10-CM | POA: Insufficient documentation

## 2015-01-23 DIAGNOSIS — E039 Hypothyroidism, unspecified: Secondary | ICD-10-CM | POA: Insufficient documentation

## 2015-01-23 DIAGNOSIS — Z7901 Long term (current) use of anticoagulants: Secondary | ICD-10-CM | POA: Insufficient documentation

## 2015-01-23 DIAGNOSIS — I482 Chronic atrial fibrillation, unspecified: Secondary | ICD-10-CM

## 2015-01-23 LAB — PT/INR
INR: 1.56 — ABNORMAL HIGH (ref 0.80–1.20)
PROTHROMBIN TIME: 17.4 s — ABNORMAL HIGH (ref 9.0–13.4)

## 2015-01-23 LAB — POC BLOOD GLUCOSE (RESULTS): GLUCOSE, POC: 103 mg/dL (ref 70–105)

## 2015-01-23 SURGERY — COLONOSCOPY
Anesthesia: Monitor Anesthesia Care | Site: Anus | Wound class: Clean Contaminated Wounds-The respiratory, GI, Genital, or urinary

## 2015-01-23 MED ORDER — LIDOCAINE (PF) 100 MG/5 ML (2 %) INTRAVENOUS SYRINGE
INJECTION | Freq: Once | INTRAVENOUS | Status: DC | PRN
Start: 2015-01-23 — End: 2015-01-23
  Administered 2015-01-23: 60 mg via INTRAVENOUS

## 2015-01-23 MED ORDER — SODIUM CHLORIDE 0.9 % (FLUSH) INJECTION SYRINGE
2.00 mL | INJECTION | INTRAMUSCULAR | Status: DC | PRN
Start: 2015-01-23 — End: 2015-01-23

## 2015-01-23 MED ORDER — PROPOFOL 10 MG/ML IV BOLUS
INJECTION | Freq: Once | INTRAVENOUS | Status: DC | PRN
Start: 2015-01-23 — End: 2015-01-23
  Administered 2015-01-23: 50 mg via INTRAVENOUS

## 2015-01-23 MED ORDER — SODIUM CHLORIDE 0.9 % (FLUSH) INJECTION SYRINGE
2.00 mL | INJECTION | Freq: Three times a day (TID) | INTRAMUSCULAR | Status: DC
Start: 2015-01-23 — End: 2015-01-23

## 2015-01-23 MED ORDER — LACTATED RINGERS INTRAVENOUS SOLUTION
INTRAVENOUS | Status: DC
Start: 2015-01-23 — End: 2015-01-23

## 2015-01-23 MED ORDER — PROPOFOL 10 MG/ML INTRAVENOUS EMULSION
INTRAVENOUS | Status: DC | PRN
Start: 2015-01-23 — End: 2015-01-23
  Administered 2015-01-23: 250 ug/kg/min via INTRAVENOUS
  Administered 2015-01-23: 175 ug/kg/min via INTRAVENOUS
  Administered 2015-01-23: 0 ug/kg/min via INTRAVENOUS

## 2015-01-23 SURGICAL SUPPLY — 74 items
CANNULA INJ 17GA NDLS SYRG BLUNT STRL LF  10ML BD INTRLNK PLASTIC BXTR INTLNK ABT LS MCGAW SAFELINE (IV TUBING & ACCESSORIES) IMPLANT
CANNULA NASAL 14FT ANGL FLXB LIP PLATE CRSH RS LUM TUBE NFLR TP ADULT ARLF UCIT STD CURVE LF  DISP (CANNULA) IMPLANT
CANNULA NASAL 14FT ANGL FLXB L_IP PLATE CRSH RS LUM TUBE NFLR (CANNULA)
CANNULA NASAL 7FT ANGL FLXB LIP PLATE CRSH RS LUM TUBE FLR TIP ADULT ARLF UCIT STD CURVE LF  DISP (CANNULA) IMPLANT
CANNULA NASAL 7FT ANGL FLXB LI_P PLATE CRSH RS LUM TUBE FLR (CANNULA)
CANNULA PLASTIC NEEDLELESS IV_303345 100/BX (IV TUBING & ACCESSORIES)
CATH ELHMST GLD PRB 10FR 300CM_BIPO RND DIST TIP STD CONN (DIAGNOSTIC)
CATH ELHMST GLD PROBE 10FR 300CM BIPOLAR RND DIST TIP STD CONN FIRM SHAFT HMGLD STRL DISP 3.7MM MN (DIAGNOSTIC) IMPLANT
CATH ELHMST GLD PROBE 7FR 300CM BIPOLAR RND DIST TIP STD CONN FIRM SHAFT HMGLD STRL DISP 2.8MM MN (DIAGNOSTIC) IMPLANT
CATH ELHMST GLD PROBE 7FR 300C_M BIPOLAR RND DIST TIP STD (DIAGNOSTIC)
CLIP HMST RADOPQ PRELD STRL DISP RSL 235CM 2.8MM 11MM OPN (SURGICAL INSTRUMENTS) IMPLANT
CONTAINR STRL 10% NEUT BF FRMLN POLYPROP GRAD LEAK RST ORNG PREFL SCREW CAP FSHR HLTHCR PRTCL GRN (CHEM) ×1
CONV USE ITEM 343591 - SOLIDIFY FLUID 1500ML DSPNSR L_Q TX SOLIDIFY SFTP LTS+ DISP (STER) ×1 IMPLANT
DEVICE RESOLUTION CLIP 235CML_M00522611 11MMW 10EA/BX (INSTRUMENTS)
DEVICE SPEC RETR TLN 2.5MM 160CM 4 PRONG GRASPER INWRD HOOK SHEATH SS DISP (ENDOSCOPIC SUPPLIES) IMPLANT
DEVICE SPEC RETR TLN 2.5MM 160_CM 4 PRONG GRSP INWRD FACE (INSTRUMENTS ENDOMECHANICAL)
DILATOR ENDOS CRE 240CM 5.5CM 11-13.5-15MM 7.5FR ESOPH PYL BIL BAL LOW PROF GW PEBAX STRL LF  DISP (BALLOON) IMPLANT
DILATOR ENDOS CRE 240CM 5.5CM 15-16.5-18MM 7.5FR ESOPH PYL BIL BAL LOW PROF GW PEBAX STRL LF  DISP (BALLOON) IMPLANT
DILATOR ENDOS CRE 240CM 5.5CM_11-13.5-15MM 7.5FR ESOPH PYL (BALLOON)
DILATOR ENDOS CRE 240CM 5.5CM_15-16.5-18MM 7.5FR ESOPH PYL (BALLOON)
DISC USE ITEM 309153 - TRAP SPECI ARGYLE 40CC GRAD SC_REW ON CAP REM MALE CONN (Cautery Accessories)
DISCONTINUED NO SUB - JELLY LUB DYNALUBE BCTRST WATER SOL NGRS PKT STRL 5GM LF (WOUND CARE SUPPLY) IMPLANT
DISCONTINUED USE ITEM 309153 - TRAP SPECI ARGYLE 40CC GRAD SC_REW ON CAP REM MALE CONN (Cautery Accessories) IMPLANT
DISCONTINUED USE ITEM 339015 - CONTAINR STRL 10% NEUT BF FRMLN POLYPROP GRAD LEAK RST ORNG PREFL SCREW CAP FSHR HLTHCR PRTCL GRN (CHEM) ×1 IMPLANT
DISCONTINUED USE ITEM 82101 - TUBING OXYGEN 50/CS 001302 (TUBE/TUBING & SUCTION SUPPLIES) IMPLANT
ELECTRODE PATIENT RTN 9FT VLAB C30- LB RM PHSV ACRL FOAM CORD NONIRRITATE NONSENSITIZE ADH STRP (CAUTERY SUPPLIES) ×1 IMPLANT
ELECTRODE PATIENT RTN 9FT VLAB_REM C30- LB PLHSV ACRL FOAM (CAUTERY SUPPLIES) ×1
FORCEPS BIOPSY 240CM 2.4MM RJ_4 2.8MM LRG CPC DISP ORNG (GUIDING)
FORCEPS BIOPSY HOT 240CM 2.2MM RJ 4 +2.8MM DISP (INSTRUMENTS)
FORCEPS BIOPSY HOT 240CM 2.2MM RJ 4 +2.8MM DISPO (SURGICAL INSTRUMENTS) IMPLANT
FORCEPS BIOPSY HOT 240CM 2.2MM_RJ 4 +2.8MM DISP (INSTRUMENTS)
FORCEPS BIOPSY MICROMESH TTH STREAMLINE CATH 240CM 2.4MM RJ 4 SS LRG CPC STRL DISP ORNG 2.8MM WRK (GUIDING) IMPLANT
FORCEPS BIOPSY NEEDLE 160CM 1.8MM RJ 4 DISP YW 2MM WRK CHNL GSPED (SURGICAL INSTRUMENTS) IMPLANT
FORCEPS BIOPSY NEEDLE 160CM 1._8MM RJ 4 PED 2+ MM DISP (INSTRUMENTS)
FORCEPS BIOPSY NEEDLE 240CM 2.2MM RJ 4 2.8MM STD CPC STRL DISP ORNG (SURGICAL INSTRUMENTS) IMPLANT
FORCEPS BIOPSY NEEDLE 240CM 2._2MM RJ 4 2.8MM STD CPC STRL (INSTRUMENTS)
FORCEPS ENDOS 240CM 2.8MM RJ 4 JMB DISP (SURGICAL INSTRUMENTS) IMPLANT
FORCEPS ENDOS 240CM 2.8MM RJ 4_JMB DISP (INSTRUMENTS)
FORMALIN 10% BUFF 8ML_23032059 100EA/PK (CHEM) ×1
GOWN SURG XL AAMI L3 NONREINFO_RCE HKLP CLSR STRL LTX PNK SMS (DGOW)
GOWN SURG XL L3 NONREINFORCE HKLP CLSR STRL LTX PNK SMS 47IN (DGOW) IMPLANT
INK ENDOSCOPIC MARKER SPOT 5ML GIS44 STERILE 10EA/BX (MISCELLANEOUS PT CARE ITEMS) IMPLANT
JELLY LUB EZ BCTRST H2O SOL NG_RS PKT STRL 5GM LF (WOUND CARE/ENTEROSTOMAL SUPPLY)
KIT ENDOS ENDOZIME BDSD SLR SPONGE ENZM DETERGENT 500ML (DIS) ×1 IMPLANT
KIT ENDOS ENZM BDSD SLR SPONGE_ENZM DTRG 500ML (DIS) ×1
LINER SUCT RD CRD MEDIVAC TW LOCK LID SHTOF VALVE CAN FILTER 1500CC LF  DISP (Suction) ×1 IMPLANT
LINER SUCT RD CRD MEDIVAC TW L_OCK LID SHTOF VALVE CAN FLTR (Suction) ×1
LOOP AUTOCLAV HLDR DTCH 30MM S_URG NYL PLPCTM NONST DISP (INSTRUMENTS ENDOMECHANICAL)
LOOP ENDO DETACH 20MM MAJ340 (GU) IMPLANT
LOOP HLDR DTCH AUTOCLAV 30MM SURG NYL PLPCTM NONST LF  DISP (ENDOSCOPIC SUPPLIES) IMPLANT
NEEDLE ENDOS 5MM 25GA 2.5MM SS TFLN 230CM INJ PRJ SHEATH LL SPRG LD HNDL CRLK STRL LF  DISP (NEEDLES & SYRINGE SUPPLIES) IMPLANT
NEEDLE ENDOS 5MM 25GA 2.5MM SS_TFLN 230CM INJ PRJ SHTH LL (NEEDLES & SYRINGE SUPPLIES)
NEEDLE SCLRTX 25GA 2.3MM OPTC TIP SHTH STRL LF DISP YW (NEEDLES & SYRINGE SUPPLIES) IMPLANT
PROBE COAG 7.2FT 6.9FR FIAPC CRCMF PLUG PLAY FUNCTIONALITY FILTER (ENDOSCOPIC SUPPLIES) IMPLANT
PROBE ESURG 220CM 2.3MM FIAPC FLXB STR FIRE STRL DISP (CAUTERY SUPPLIES) IMPLANT
PROBE ESURG 220CM 2.3MM FIAPC_FLXB STR FIRE ARGON PLAS COAG (CAUTERY SUPPLIES)
PROBE ESURG 7.2FT 6.9FR FIAPC_CRCMF PLUG PLAY FUNCTIONALITY (INSTRUMENTS ENDOMECHANICAL)
SET CAP ENDO PUMPTUBE 2032522_EA (GENE)
SET EXT STR 6.2ML 42IN IV MALE LL ADPR STD BORE 2 LL ACT VALVE RETRACT COL STRL CLRLNK LF  DEHP (IV TUBING & ACCESSORIES) IMPLANT
SET EXT STR 6.2ML 42IN IV MALE_LL ADPR STD BORE 2 LL ACT (IV TUBING & ACCESSORIES)
SET TUBING ERBEFLOW CAP 24 HR ENDOSCP PUMP STRL DISP ORDER 10EA (GENE) IMPLANT
SNARE SM OVAL 240CM 2.4MM SNS LOOP SHRTHRW FLXB ENDOS PLPCTM 13MM STRL LF  DISP (DIAGNOSTIC) ×1 IMPLANT
SNARE SM OVL 240CM 2.4MM SNS L_OOP SHRTHRW FLXB ENDOS PLPCTM (DIAGNOSTIC) ×1
SOLIDIFY FLUID 1500ML DSPNSR L_Q TX SOLIDIFY SFTP LTS+ DISP (STER) ×2
SOLUTION IRRG H2O 500CC 2F7113 (SOLUTIONS) ×2
SYRINGE INFLAT ALN II GA STRL DISP 60ML (NEEDLES & SYRINGE SUPPLIES) IMPLANT
SYRINGE INFLAT ALN II GA STRL_DISP 60ML (NEEDLES & SYRINGE SUPPLIES)
SYRINGE LL 50ML LF  STRL GRAD N-PYRG DEHP-FR PVC FREE MED DISP CLR (NEEDLES & SYRINGE SUPPLIES) IMPLANT
SYRINGE LL 50ML LF STRL GRAD_N-PYRG DEHP-FR PVC FREE MED (NEEDLES & SYRINGE SUPPLIES)
TRAP SPEC RETR ETRAP MAGNIFY W IND MSR GUIDE PLYP LF DISP (GI LAB SUPPLIES) ×2 IMPLANT
TUBING OXYGEN 50/CS 001302 (TUBE/TUBING & SUCTION SUPPLIES)
TUBING SUCT CLR 20FT 9/32IN MEDIVAC NCDTV M/M CONN STRL LF (Suction) ×1 IMPLANT
TUBING SUCT CONN 20FT LONG_STRL N720A (Suction) ×1
WATER STRL 500ML PLASTIC PR BTL LF (SOLUTIONS) ×1 IMPLANT

## 2015-01-23 NOTE — Anesthesia Preprocedure Evaluation (Signed)
Physical Exam:     Airway       Mallampati: IV    TM distance: <3 FB    Neck ROM: full  Mouth Opening: fair.  No Facial hair  No Beard  No endotracheal tube present  No Tracheostomy present    Dental       Dentition intact             Pulmonary    Breath sounds clear to auscultation       Cardiovascular    Rhythm: regular  Rate: Normal       Other findings            Anesthesia Plan:  Planned anesthesia type: MAC  ASA 4     Intravenous induction   Anesthetic plan and risks discussed with patient.     Anesthesia issues/risks discussed are: Dental Injuries, PONV, Stroke, Blood Loss, Intraoperative Awareness/ Recall, Cardiac Events/MI, Difficult Airway and Aspiration.    Use of blood products discussed with patient whom consented to blood products.     Patient's NPO status is appropriate for Anesthesia.         Plan discussed with CRNA.

## 2015-01-23 NOTE — Nurses Notes (Signed)
Patient discharged home with family.  AVS reviewed with patient/care giver.  A written copy of the AVS and discharge instructions was given to the patient/care giver.  Questions sufficiently answered as needed.  Patient/care giver encouraged to follow up with PCP as indicated.  In the event of an emergency, patient/care giver instructed to call 911 or go to the nearest emergency room.

## 2015-01-23 NOTE — Anesthesia Postprocedure Evaluation (Signed)
ANESTHESIA POSTOP EVALUATION NOTE         01/23/2015     Last Vitals: Temperature: 36.2 C (97.2 F) (01/23/15 1035)  Heart Rate: (!) 105 (01/23/15 1035)  BP (Non-Invasive): 111/71 (01/23/15 1035)  Respiratory Rate: 16 (01/23/15 1035)  SpO2-1: 93 % (01/23/15 1035)    Procedures:   COLONOSCOPY (N/A Anus)  COLONOSCOPY WITH POLYPECTOMY (N/A Anus)    Patient is sufficiently recovered from the effects of anesthesia to participate in the evaluation and has returned to their pre-procedure level.  I have reviewed and evaluated the following:  Respiratory Function: Consistent with pre anesthetic level  Cardiovascular Function: Consistent with pre anesthetic level  Mental Status: Return to pre anesthetic baseline level  Pain: Sufficiently controlled with medication  Nausea and Vomiting: Absent or sufficiently controlled with medication  Post-op Anesthetic Complications: None    Comment/ re-evaluation for any variations: None

## 2015-01-23 NOTE — Care Plan (Signed)
Problem: Anxiety (Adult)  Goal: Identify Related Risk Factors and Signs and Symptoms  Related risk factors and signs and symptoms are identified upon initiation of Human Response Clinical Practice Guideline (CPG)   Outcome: Ongoing (see interventions/notes)    Problem: Thought Process Alteration (Adult)  Goal: Identify Related Risk Factors and Signs and Symptoms  Related risk factors and signs and symptoms are identified upon initiation of Human Response Clinical Practice Guideline (CPG)   Outcome: Ongoing (see interventions/notes)    Problem: Perioperative Period (Adult)  Prevent and manage potential problems including:1. bleeding2. gastrointestinal complications3. hypothermia4. infection5. pain6. perioperative injury7. respiratory compromise8. situational response9. urinary retention10. venous thromboembolism11. wound complications   Goal: Signs and Symptoms of Listed Potential Problems Will be Absent or Manageable (Perioperative Period)  Signs and symptoms of listed potential problems will be absent or manageable by discharge/transition of care (reference Perioperative Period (Adult) CPG).   Outcome: Ongoing (see interventions/notes)

## 2015-01-23 NOTE — Anesthesia Transfer of Care (Signed)
ANESTHESIA TRANSFER OF CARE NOTE                Last Vitals: Temperature: 36.2 C (97.2 F) (01/23/15 1035)  Heart Rate: (!) 105 (01/23/15 1035)  BP (Non-Invasive): 111/71 (01/23/15 1035)  Respiratory Rate: 16 (01/23/15 1035)  SpO2-1: 93 % (01/23/15 1035)    Patient transferred to PACU phase 2 in stable condition. Report given to RN.    12/14/2016at 10:35.

## 2015-01-23 NOTE — Care Plan (Signed)
Problem: Perioperative Period (Adult)  Prevent and manage potential problems including:1. bleeding2. gastrointestinal complications3. hypothermia4. infection5. pain6. perioperative injury7. respiratory compromise8. situational response9. urinary retention10. venous thromboembolism11. wound complications   Goal: Signs and Symptoms of Listed Potential Problems Will be Absent or Manageable (Perioperative Period)  Signs and symptoms of listed potential problems will be absent or manageable by discharge/transition of care (reference Perioperative Period (Adult) CPG).   Outcome: Adequate for Discharge Date Met:  01/23/15
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
## 2015-01-23 NOTE — Discharge Instructions (Signed)
Instructions for Lower Endoscopy  Patient Information:  No driving until tomorrow, You may experience a small amount of blood in your stool if a biopsy has been taken  and Call in one week for biopsy results - GI Office [phone].   SURGICAL DISCHARGE INSTRUCTIONS     Dr. Katina Degree, Lavella Lemons., MD  performed your COLONOSCOPY, COLONOSCOPY WITH POLYPECTOMY today at the [place]:  Monday through Friday from 6 a.m. - 7 p.m.: [phone]  Between 7 p.m. - 6 a.m., weekends and holidays:  Call [facility] at [phone] or [phone].    PLEASE SEE WRITTEN HANDOUTS AS DISCUSSED BY YOUR NURSE:      SIGNS AND SYMPTOMS OF A WOUND / INCISION INFECTION   Be sure to watch for the following:   Increase in redness or red streaks near or around the wound or incision.   Increase in pain that is intense or severe and cannot be relieved by the pain medication that your doctor has given you.   Increase in swelling that cannot be relieved by elevation of a body part, or by applying ice, if permitted.   Increase in drainage, or if yellow / green in color and smells bad. This could be on a dressing or a cast.   Increase in fever for longer than 24 hours, or an increase that is higher than 101 degrees Fahrenheit (normal body temperature is 98 degrees Fahrenheit). The incision may feel warm to the touch.    **CALL YOUR DOCTOR IF ONE OR MORE OF THESE SIGNS / SYMPTOMS SHOULD OCCUR.    ANESTHESIA INFORMATION   ANESTHESIA -- ADULT PATIENTS:  You have received intravenous sedation / general anesthesia, and you may feel drowsy and light-headed for several hours. You may even experience some forgetfulness of the procedure. DO NOT DRIVE A MOTOR VEHICLE or perform any activity requiring complete alertness or coordination until you feel fully awake in about 24-48 hours. Do not drink alcoholic beverages for at least 24 hours. Do not stay alone, you must have a responsible adult available to be  with you. You may also experience a dry mouth or nausea for 24 hours. This is a normal side effect and will disappear as the effects of the medication wear off.    REMEMBER   If you experience any difficulty breathing, chest pain, bleeding that you feel is excessive, persistent nausea or vomiting or for any other concerns:  Call your physician Dr. Katina Degree at [phone] or [phone]. You may also ask to have the GI doctor on call paged. They are available to you 24 hours a day.    SPECIAL INSTRUCTIONS / COMMENTS       FOLLOW-UP APPOINTMENTS   Please call patient services at [phone] or [phone] to schedule a date / time of return. They are open Monday - Friday from 7:30 am - 5:00 pm.    REMEMBER TO:  Call your physician for any of the following symptoms:  1.) Persistent vomiting or vomiting bright red blood  2.) Fever (temperature greater than 100F)  3.) Severe abdominal pain or rigid bloated abdomen.  4.) Large amounts of bright red blood or maroon stools.

## 2015-01-24 ENCOUNTER — Encounter (INDEPENDENT_AMBULATORY_CARE_PROVIDER_SITE_OTHER): Payer: Self-pay | Admitting: Gastroenterology

## 2015-01-24 ENCOUNTER — Ambulatory Visit (HOSPITAL_BASED_OUTPATIENT_CLINIC_OR_DEPARTMENT_OTHER): Payer: Medicare Other | Admitting: Student in an Organized Health Care Education/Training Program

## 2015-01-24 ENCOUNTER — Ambulatory Visit (HOSPITAL_BASED_OUTPATIENT_CLINIC_OR_DEPARTMENT_OTHER)
Admission: RE | Admit: 2015-01-24 | Discharge: 2015-01-24 | Disposition: A | Discharge status: Home / Self Care | Payer: Medicare Other | Source: Ambulatory Visit

## 2015-01-24 ENCOUNTER — Other Ambulatory Visit (HOSPITAL_COMMUNITY): Payer: Self-pay

## 2015-01-24 ENCOUNTER — Encounter (HOSPITAL_BASED_OUTPATIENT_CLINIC_OR_DEPARTMENT_OTHER): Payer: Self-pay | Admitting: Student in an Organized Health Care Education/Training Program

## 2015-01-24 ENCOUNTER — Ambulatory Visit (HOSPITAL_BASED_OUTPATIENT_CLINIC_OR_DEPARTMENT_OTHER): Payer: Medicare Other | Admitting: Surgery

## 2015-01-24 ENCOUNTER — Encounter (HOSPITAL_BASED_OUTPATIENT_CLINIC_OR_DEPARTMENT_OTHER): Payer: Self-pay | Admitting: Surgery

## 2015-01-24 ENCOUNTER — Ambulatory Visit (HOSPITAL_BASED_OUTPATIENT_CLINIC_OR_DEPARTMENT_OTHER)
Admission: RE | Admit: 2015-01-24 | Discharge: 2015-01-24 | Disposition: A | Discharge status: Home / Self Care | Payer: Medicare Other | Source: Ambulatory Visit | Attending: Student in an Organized Health Care Education/Training Program | Admitting: Student in an Organized Health Care Education/Training Program

## 2015-01-24 ENCOUNTER — Ambulatory Visit
Admission: RE | Admit: 2015-01-24 | Discharge: 2015-01-24 | Disposition: A | Discharge status: Home / Self Care | Payer: Medicare Other | Source: Ambulatory Visit | Attending: Surgery | Admitting: Surgery

## 2015-01-24 DIAGNOSIS — E119 Type 2 diabetes mellitus without complications: Secondary | ICD-10-CM | POA: Insufficient documentation

## 2015-01-24 DIAGNOSIS — K7460 Unspecified cirrhosis of liver: Secondary | ICD-10-CM

## 2015-01-24 DIAGNOSIS — C189 Malignant neoplasm of colon, unspecified: Secondary | ICD-10-CM

## 2015-01-24 DIAGNOSIS — Q8740 Marfan's syndrome, unspecified: Secondary | ICD-10-CM | POA: Insufficient documentation

## 2015-01-24 DIAGNOSIS — I10 Essential (primary) hypertension: Secondary | ICD-10-CM | POA: Insufficient documentation

## 2015-01-24 DIAGNOSIS — Z952 Presence of prosthetic heart valve: Secondary | ICD-10-CM | POA: Insufficient documentation

## 2015-01-24 DIAGNOSIS — Z801 Family history of malignant neoplasm of trachea, bronchus and lung: Secondary | ICD-10-CM | POA: Insufficient documentation

## 2015-01-24 DIAGNOSIS — Z9049 Acquired absence of other specified parts of digestive tract: Secondary | ICD-10-CM | POA: Insufficient documentation

## 2015-01-24 DIAGNOSIS — K8020 Calculus of gallbladder without cholecystitis without obstruction: Secondary | ICD-10-CM

## 2015-01-24 DIAGNOSIS — K635 Polyp of colon: Secondary | ICD-10-CM | POA: Insufficient documentation

## 2015-01-24 DIAGNOSIS — E039 Hypothyroidism, unspecified: Secondary | ICD-10-CM | POA: Insufficient documentation

## 2015-01-24 DIAGNOSIS — N433 Hydrocele, unspecified: Secondary | ICD-10-CM | POA: Insufficient documentation

## 2015-01-24 DIAGNOSIS — Z7901 Long term (current) use of anticoagulants: Secondary | ICD-10-CM | POA: Insufficient documentation

## 2015-01-24 DIAGNOSIS — I4891 Unspecified atrial fibrillation: Secondary | ICD-10-CM | POA: Insufficient documentation

## 2015-01-24 DIAGNOSIS — K219 Gastro-esophageal reflux disease without esophagitis: Secondary | ICD-10-CM | POA: Insufficient documentation

## 2015-01-24 LAB — CBC WITH DIFF
BASOPHIL #: 0.01 x10ˆ3/uL (ref 0.00–0.20)
BASOPHIL %: 0 %
EOSINOPHIL #: 0.03 x10ˆ3/uL (ref 0.00–0.50)
EOSINOPHIL %: 1 %
HCT: 37.2 % (ref 36.7–47.0)
HGB: 11.8 g/dL — ABNORMAL LOW (ref 12.5–16.3)
LYMPHOCYTE #: 0.85 x10ˆ3/uL — ABNORMAL LOW (ref 1.00–4.80)
LYMPHOCYTE %: 21 %
MCH: 26.2 pg — ABNORMAL LOW (ref 27.4–33.0)
MCHC: 31.8 g/dL — ABNORMAL LOW (ref 32.5–35.8)
MCV: 82.5 fL (ref 78.0–100.0)
MONOCYTE #: 0.31 x10ˆ3/uL (ref 0.30–1.00)
MONOCYTE %: 8 %
MPV: 9.2 fL (ref 7.5–11.5)
NEUTROPHIL #: 2.89 x10ˆ3/uL (ref 1.50–7.70)
NEUTROPHIL %: 71 %
PLATELETS: 154 x10ˆ3/uL (ref 140–450)
RBC: 4.51 x10ˆ6/uL (ref 4.06–5.63)
RDW: 18.2 % — ABNORMAL HIGH (ref 12.0–15.0)
WBC: 4.1 x10ˆ3/uL (ref 3.5–11.0)

## 2015-01-24 LAB — COMPREHENSIVE METABOLIC PANEL, NON-FASTING
ALBUMIN: 3.5 g/dL (ref 3.4–4.8)
ALKALINE PHOSPHATASE: 72 U/L (ref <150)
ALT (SGPT): 13 U/L (ref <55)
ANION GAP: 5 mmol/L (ref 4–13)
AST (SGOT): 21 U/L (ref 8–48)
BILIRUBIN TOTAL: 0.7 mg/dL (ref 0.3–1.3)
BUN/CREA RATIO: 15 (ref 6–22)
BUN: 16 mg/dL (ref 8–25)
CALCIUM: 9.1 mg/dL (ref 8.5–10.4)
CHLORIDE: 103 mmol/L (ref 96–111)
CO2 TOTAL: 26 mmol/L (ref 22–32)
CREATININE: 1.04 mg/dL (ref 0.62–1.27)
ESTIMATED GFR: >59 mL/min/1.73mˆ2 (ref >59)
GLUCOSE: 99 mg/dL (ref 65–139)
POTASSIUM: 4.4 mmol/L (ref 3.5–5.1)
PROTEIN TOTAL: 7.3 g/dL (ref 6.0–8.0)
SODIUM: 134 mmol/L — ABNORMAL LOW (ref 136–145)

## 2015-01-24 LAB — PHOSPHORUS: PHOSPHORUS: 3 mg/dL (ref 2.3–4.0)

## 2015-01-24 LAB — CARCINOEMBRYONIC ANTIGEN: CEA: 1.5 ng/mL (ref <=5.0)

## 2015-01-24 LAB — MAGNESIUM: MAGNESIUM: 1.8 mg/dL (ref 1.6–2.5)

## 2015-01-24 LAB — HISTORICAL SURGICAL PATHOLOGY SPECIMEN

## 2015-01-24 MED ORDER — IOPAMIDOL 300 MG IODINE/ML (61 %) INTRAVENOUS SOLUTION
100.00 mL | INTRAVENOUS | Status: AC
Start: 2015-01-24 — End: 2015-01-24
  Administered 2015-01-24: 13:00:00 100 mL via INTRAVENOUS

## 2015-01-24 MED ORDER — DIATRIZOATE MEGLUMINE & SODIUM 66 %-10 % (GASTROGRAFIN) ORAL SOLN
8.00 mL | ORAL | Status: AC
Start: 2015-01-24 — End: 2015-01-24
  Administered 2015-01-24: 8 mL via ORAL

## 2015-01-24 NOTE — Progress Notes (Signed)
Patient: Kenneth Weeks  D.O.B.: [birth year]  MRN# [id number]  Date of Service: 01/24/2015    Dear dr Anabel Bene:    I saw Kenneth Weeks in clinic today. He is a 64 year old male who is 6 months s/p laparoscopic right hemicolectomy for a T2N0 colon cancer which was discovered after an episode of lower GI bleeding. He was discovered, as well, at the time of surgery to have macronodular cirrhosis of the liver. He returns today for cancer and liver surveillance. He is feeling well. He has lost some weight after modifying his diet some. He had a colonoscopy yesterday during which a small polyp was excised. His main complaint is a possible hernia at the superior aspect of his incision.    In clinic, he looks well. The abdomen is soft and benign. The wound is well healed. There may be a hernia in evolution at the very superior most extent of his incision, as there is a bulge there with maneuvers to increase the abdominal pressure, however, there is not an obvious fascial defect felt on clinical examination.    CTAP was viewed by me. There is no evidence of recurrent or metastatic disease above or below the diaphragm. The midline fascia appears intact without an obvious hernia defect     My assessment is doing well 6 months s/p laparoscopic right hemicolectomy for stage 1 colon cancer. He is now with evidence of recurrent disease (NED). The plan is to continue his surveillance with a CT scan and office visit in 6 months. 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 I am referring him to Dr Doree Albee from GI hepatology to establish care and follow him longitudinally for his chronic liver disease.    I am grateful to be involved in his care and will keep you informed of his progress.    Warmest Regards,    [name], MD [facility]  Assistant Professor of [facility]

## 2015-01-24 NOTE — Cancer Center Note (Signed)
PRINCIPAL DIAGNOSIS: pT2N0 Stage I colon cancer    ONCOLOGICAL HISTORY:  April 2016-Large right hydrocele that was drained. He gained 60 lbs of water weight post-operatively and was put on lasix. There was concern for infection too and he was started on antibiotocs as well.   Jul 09, 2014. He presented with acute onset of rectal bleeding with bloody diarrhea and generalized weakness. He has been on anticoagulation for chronic atrial fibrillation aand St Jude metal valve replacement in 1999.  His INR shot up to 10.6 at that time from antibiotics & coumadin interaction  Jul 10, 2014, Colonoscopy was performed. It showed large mass on 1st fold rigth colon covering 1/3wall circumference. Clean, superficial ulcer overlying mass. No active bleeding on initial inspection. Multiple biopsies obtained with cold biopsy forceps. Pathology of ascending colon mass revealed adenocarcinoma  Jun 15, 2014. He underwent laproscopic right hemicolectomy. Pathology revealed moderately differentiated adenocarcinoma, resection margins free, 0/30 nodes, LVI neg, perineural invasion neg. pT2N0  Jan 23, 2015. Colonoscopy revealed a pedunculated polyp measuring 10 mm in size in the sigmoid colon. A polypectomy was performed using snare cautery. The resection was complete, the polyp tissue was completely retrieved. Histology is pending.    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 S/P laproscopic right hemicolectomy 06/15/2014. PT2N0, Stage 1 colon cancer. He is on active surveillance. He presents to office today for 42-month follow up. He feels much better than how he has felt in long time. He is moving his bowels regularly, denies any diarrhea or  constipation, or abdominal pain. He completed colonoscopy yesterday and a 10 mm polyp in sigmoid colon was resected. Appetite is good. He has gained 5 kgs since surgery. He is on lasix 40 mg bid. Scrotal swelling has gone down considerably,  He has not followed with a urologist. He complains of itching in back since surgery R>L when clothing is on. Denies any rash. Every now and then he feels a knot in the incicion area and it goes away with rubbing. He also follows with his PCP in [place].      REVIEW OF SYSTEMS:  General: (-) pain. (-) fevers (-) chills. (-) weight loss. (+) fatigue.  Lymphatic: (-) palpable masses. (-) night sweats.  Heme: (-) easy bruising (-) bleeding. (-) recurrent infections.   HEENT. (-) vision changes (-) hearing changes. (-) dysphagia. (-) sore throat.   Heart: (+) mechanical valve (-) chest pain. (-) palpitation. (-) orthopnea. (-) LE edema.   Lungs: (-) dyspnea (on exertion) (-) hemoptysis. (-) cough.   Abdomen: (-) poor appetite. (-) abdominal pain. (-) nausea (-) vomiting. (-) diarrhea. (-) constipation.   GU: (-) dysuria (-) Urgency. (-) Hematuria.   MS. (-) joint pain (-) ext swelling. (-) Back pain.   Dermatologic: (-) rashes. (+) pruritus of back .   Psychiatric: (-) Depression. (-) anxiety. (-) insomnia.   Neurologic: (-) headaches. (-) neuropathy. (-) weakness. (-) memory problems.  Endocrine:(+) hypothyroidism  Other review of systems negative.       Past Medical History  Current Outpatient Prescriptions   Medication Sig    docusate sodium (COLACE) 100 mg Oral Capsule Take 1 Cap (100 mg total) by mouth Twice daily    furosemide (LASIX) 20 mg Oral Tablet TAKE  THREE TABLETS BY MOUTH TWO TIMES A DAY    furosemide (LASIX) 40 mg Oral Tablet Take 1 Tab (40 mg total) by mouth Once a day (Patient taking differently: Take 40 mg by mouth Twice daily )    levothyroxine (SYNTHROID) 200 mcg Oral Tablet Take 1 Tab (200 mcg total) by mouth Once a day    MAGNESIUM CARBONATE  ORAL Take by mouth Once a day    POTASSIUM GLUCONATE ORAL Take by mouth Once a day    Sildenafil (VIAGRA) 100 mg Oral Tablet Take 1 Tab (100 mg total) by mouth Every 24 hours as needed    spironolactone (ALDACTONE) 50 mg Oral Tablet TAKE ONE TABLET BY MOUTH ONE TIME DAILY    warfarin (COUMADIN) 5 mg Oral Tablet TAKE TWO TABLETS BY MOUTH DAILY OR AS DIRECTED     Allergies   Allergen Reactions    Allegra [Fexofenadine]  Other Adverse Reaction (Add comment)     "retain fluid"     Past Medical History   Diagnosis Date    A-fib (HCC)      Chronic anti-coagulant therapy    Acute on chronic renal failure (HCC)     Cancer (HCC)     Congestive heart failure (HCC)     Diabetes mellitus (HCC)      type ii diet controlled    Hemorrhage of gastrointestinal tract 5/16    Hemorrhagic shock (HCC)     Hydrocele     Hypertension 07/10/2014    Marfan syndrome     Muscle weakness      Generalized    Reflux     Syncope     Thyroid disease      hypothyroid    Transfusion history      s/p Multiple units PRC/FFP this adm.    Valvular disease     Wears glasses          Past Surgical History   Procedure Laterality Date    Hx heart valve surgery  1999     st judes     Hx upper endoscopy  07/09/14    Hx tonsil and adenoidectomy      Hx tonsillectomy      Colonoscopy      Hx heart catheterization      Hx wisdom teeth extraction           Family History   Problem Relation Age of Onset    Hypertension Mother     Lung Cancer Father     Cirrhosis Brother          Social History     Social History    Marital status: Married     Spouse name: N/A    Number of children: N/A    Years of education: N/A     Social History Main Topics    Smoking status: Never Smoker    Smokeless tobacco: Never Used    Alcohol use No    Drug use: No    Sexual activity: Not on file     Other Topics Concern    Routine Exercise Yes    Ability To Walk 2 Flight Of Steps Without Sob/Cp Yes    Unable To Ambulate No    Total Care No     Ability To Do Own Adl's Yes    Uses Walker No    Uses Cane No     Social History Narrative     Married, lives with wife,. Has 4 children.  One daughter has Marfan's syndrome. He is retired, worked as Government social research officer in [place] appearance: Awake, alert and in no acute distress.  HEENT: Normocephalic, atraumatic, EOMI, sclera anicteric, oropharynx moist and pink, neck supple, trachea midline .  Respiratory: Clear to ausculatation bilaterally,no wheezes,crackles, rhonchi or rales.  Cardiovascular: RRR, no murmurs, rubs, gallops, no JVD, 2+ radial pulses bilaterally.  Gastrointestinal: Soft nontender, nondistended, no abdominal masses noted. possible hernia at the superior aspect of his incision. Incision has healed well.  Lymphatics: No palpable lymphadenopathy.  Musculoskeletal: 5/5 strength in the upper and and 4/5 in lower extremities.  Skin: Non jaundiced, no rashes.  Neurologic: Cranial nerves II-XII grossly intact. Non-focal  Psychiatric: Normal affect, good insight.  GU: Right sided hyrocele    LABS from today   Ref. Range 01/24/2015 10:27   WBC Latest Ref Range: 3.5 - 11.0 x103/uL 4.1   HGB Latest Ref Range: 12.5 - 16.3 g/dL 11.8 (L)   HCT Latest Ref Range: 36.7 - 47.0 % 37.2   PLATELET COUNT Latest Ref Range: 140 - 450 x103/uL 154   RBC Latest Ref Range: 4.06 - 5.63 x106/uL 4.51   MCV Latest Ref Range: 78.0 - 100.0 fL 82.5   MCHC Latest Ref Range: 32.5 - 35.8 g/dL 31.8 (L)   MCH Latest Ref Range: 27.4 - 33.0 pg 26.2 (L)   RDW Latest Ref Range: 12.0 - 15.0 % 18.2 (H)   MPV Latest Ref Range: 7.5 - 11.5 fL 9.2   PMN'S Latest Units: % 71   LYMPHOCYTES Latest Units: % 21   EOSINOPHIL Latest Units: % 1   MONOCYTES Latest Units: % 8   BASOPHILS Latest Units: % 0   PMN ABS Latest Ref Range: 1.50 - 7.70 x103/uL 2.89   LYMPHS ABS Latest Ref Range: 1.00 - 4.80 x103/uL 0.85 (L)   EOS ABS Latest Ref Range: 0.00 - 0.50 x103/uL 0.03   MONOS ABS Latest Ref Range: 0.30 - 1.00  x103/uL 0.31   BASOS ABS Latest Ref Range: 0.00 - 0.20 x103/uL 0.01   SODIUM Latest Ref Range: 136 - 145 mmol/L 134 (L)   POTASSIUM Latest Ref Range: 3.5 - 5.1 mmol/L 4.4   CHLORIDE Latest Ref Range: 96 - 111 mmol/L 103   CARBON DIOXIDE Latest Ref Range: 22 - 32 mmol/L 26   BUN Latest Ref Range: 8 - 25 mg/dL 16   CREATININE Latest Ref Range: 0.62 - 1.27 mg/dL 1.04   GLUCOSE Latest Ref Range: 65 - 139 mg/dL 99   ANION GAP Latest Ref Range: 4 - 13 mmol/L 5   BUN/CREAT RATIO Latest Ref Range: 6 - 22  15   ESTIMATED GLOMERULAR FILTRATION RATE Latest Ref Range: >59 mL/min/1.54m2 >59   CALCIUM Latest Ref Range: 8.5 - 10.4 mg/dL 9.1   MAGNESIUM Latest Ref Range: 1.6 - 2.5 mg/dL 1.8   PHOSPHORUS Latest Ref Range: 2.3 - 4.0 mg/dL 3.0   TOTAL PROTEIN Latest Ref Range: 6.0 - 8.0 g/dL 7.3   ALBUMIN Latest Ref Range: 3.4 - 4.8 g/dL 3.5   BILIRUBIN, TOTAL Latest Ref Range: 0.3 - 1.3 mg/dL 0.7   AST (SGOT) Latest Ref Range: 8 - 48 U/L 21   ALT (SGPT) Latest Ref Range: <55 U/L 13   ALKALINE PHOSPHATASE Latest Ref Range: <150 U/L 72   CARCINOEMBRYONIC AG Latest Ref Range: <=5.0 ng/mL 1.5       PROCEDURE:  COLONOSCOPY 01/23/2015 FINDINGS: 1. COLON FINDINGS: A  pedunculated polyp measuring 10 mm in size was  found in the sigmoid colon. A polypectomy was performed using snare cautery.  The resection was complete, the polyp tissue was completely retrieved and sent  to histology.  2. Mild diverticulosis was noted in the sigmoid colon.  3. There was evidence of a normal appearing prior surgical anastomosis in the  ascending colon. Retroflexed views in the rectum revealed internal  hemorrhoids.   The scope was then withdrawn from the patient and the  procedure terminated. Vital signs were monitored constantly through out the  Procedure.      PATHOLOGY REVIEW  Final Pathologic Diagnosis    RIGHT COLON, HEMICOLECTOMY:    - Moderately differentiated adenocarcinoma. (See Cancer Case Summary.)    - Resection margins are  free of neoplasm.    - Thirty lymph nodes identified, all negative for malignancy (0/30).    - Pathologic staging: pT2 N0.    - Appendix with no diagnostic abnormalities recognized.          CAP CANCER CASE SUMMARY: COLON    Specimen: Terminal ileum, cecum, appendix and ascending colon.  Procedure: Right hemicolectomy.  Tumor site: Right ascending colon.  Tumor size: Greatest dimension: 3.5 cm.  Macroscopic tumor perforation: Not identified.  Histologic type: Adenocarcinoma.  Histologic grade: Low-grade (well-differentiated to moderately differentiated).  Microscopic tumor extension: Tumor invades muscularis propria.  Margins: All margins uninvolved by invasive carcinoma.    Distance of invasive carcinoma from closest margin: 5.5 cm from proximal  margin.  Lymph-vascular invasion: Not identified.  Perineural invasion: Not identified.  Tumor deposits: Not identified.  Pathologic staging: pT2, pN0.    Number of lymph nodes examined: 76.    Number of lymph nodes involved: 0.  Additional pathologic findings: None identified.        IMAGING  1) CT 07/11/2014  IMPRESSION:   1.Small bilateral pleural effusions. 2 pulmonary nodules, largest measuring 4 mm. Followup with CT chest in 6-12 months is recommended.  2. Cholelithiasis with mild gallbladder wall thickening. If there is concern for acute cholecystitis further evaluation with ultrasound is recommended.  3. Patient's known cecal mass is not visualized. No evidence of obstruction. Diverticulosis without evidence for complicated disease.  4. Mild nonspecific mesenteric stranding and minimal free fluid within the pelvis.    2) CT CHEST ABDOMEN PELVIS W IV CONTRAST 01/24/2015    CHEST:  HEART/MEDIASTINUM/ESOPHAGUS: Heart size is normal. There is no pericardial effusion. There are mild coronary calcifications. Diameter of the aorta and pulmonary trunk is normal. Multiple subcentimeter mediastinal and right  hilar lymph nodes are seen.     LUNGS/PLEURA: 3 mm nodular region on series 10 image 42 likely represents a vessel en face. There is a 2 mm nodule right upper lobe on series 10 image 54. Additional 2 mm nodule right upper lobe on series 10 image 71 identified and likely calcified granuloma is seen on series 4 image 69 within the right middle lobe measuring 4 mm. There are multiple additional 2 mm nodules within the right middle and lower lobes as well as few tiny calcified nodules likely representing granulomas. Additionally, there are multiple approximately 2 mm nodules scattered throughout the left lung and tiny calcified granulomas. There is no pneumothorax or pleural effusion.    OTHER: Prominent right cervical nodes are noted, maximum dimension measuring 1.4 cm. The thyroid is unremarkable. Sclerotic lesion within the distal right clavicle on series 3 image 14 likely represents a bone island.    ABDOMEN/PELVIS:  LIVER: There is a nonspecific 4 mm linear hypoattenuation within the left hepatic lobe seen on series 5 image 12 which likely represents a bile duct. The liver size is normal. There is a mildly dysmorphic liver with no overt signs of cirrhosis.    BILIARY SYSTEM/GALLBLADDER: There is no intrahepatic or extrahepatic bile duct dilation. Cholelithiasis is identified, with no evidence of gallbladder distention or pericholecystic fluid.    PANCREAS: There is mild fatty replacement of the pancreas. No peripancreatic stranding or pancreatic duct dilatation is identified.    STOMACH: The stomach is incompletely distended, limiting evaluation for wall thickening.    SPLEEN: The spleen is of normal size and enhances homogenously.    ADRENALS: No adrenal nodules are seen.    KIDNEYS/URETER/BLADDER: No renal masses or renal calculi are visualized. There is no hydronephrosis. The bilateral ureters are of normal caliber. Bladder is incompletely distended, limiting evaluation for wall  thickening.    REPRODUCTIVE ORGANS: The prostate is of normal size and the seminal vesicles are symmetric.    BOWEL: Postsurgical changes from right hemicolectomy are identified. Anastomotic site within the right abdomen is seen , with evidence of few subcentimeter shotty mesenteric lymph nodes in this region as well as any nodular density on series 14 image 40 which could represent lymph nodes or postsurgical scarring. There is no evidence of bowel obstruction. Evaluation for bowel wall thickening is limited due to incomplete distention. There is evidence of uncomplicated sigmoid diverticulosis.    VASCULATURE/LYMPH NODES: The aorta demonstrates mild atherosclerotic disease. The celiac, superior mesenteric, bilateral renal arteries and inferior mesenteric artery are patent. The portal vein and the SMV are unremarkable. There are multiple subcentimeter celiac axis and periportal lymph nodes as well as multiple subcentimeter periaortic lymph nodes. There are upper limits of normal in size bilateral inguinal lymph nodes, the largest is in the right inguinal canal measuring 1.4 x 1.2 cm which appears to contain a fatty hilum and is likely benign.    PERITONEUM / RETROPERITONEUM: No free air or fluid is identified within the abdomen or pelvis.    BONES/SOFT TISSUES: There is a sclerotic rounded lesion within the T5 vertebral body, which likely represents a bone island. There is a left inguinal hernia which contains a segment of the sigmoid colon. There is no bowel obstruction. Diffuse degenerative changes are seen in the spine. No suspicious osseous lesions are visualized. There is a tiny fat containing umbilical hernia.  IMPRESSION:   1. Postsurgical changes from right hemicolectomy for colonic adenocarcinoma with nodular density adjacent to the anastomotic site which could represent shotty lymph nodes or scarring. Close attention on followup is recommended.  2. Left inguinal hernia containing a segment of sigmoid  colon with no evidence of bowel obstruction.  3. Multiple tiny pulmonary nodules. Chest CT is recommended in 6 months for followup.   4. Prominent right cervical lymph nodes, which are nonspecific and could be reactive. Clinical correlation is recommended.   3) [place] RT UPPER QUADRANT 12/15 2016   FINDINGS:     The pancreas is not seen on today's study    The size of the liver is within normal limits, measuring 17.0 cm along the longitudinal axis of the right hepatic lobe. Hepatic echogenicity is within normal limits with no evidence of focal lesions or fatty change.   Doppler demonstrates the portal vein and hepatic veins to be patent with appropriate direction of flow. There is no evidence of intra-/extrahepatic biliary duct dilation. The common bile duct  measures 2.7 mm.    The gallbladder is contracted as the patient was not NPO prior to the study. Intraluminal gallstones are identified with corresponding posterior shadowing. No adjacent fluid is seen to suggest acute cholecystitis.    The right kidney measures 11.0 x 7.1 cm. There is no evidence of hydronephrosis.    IMPRESSION:  1. Unremarkable ultrasound evaluation of the liver with no evidence to suggest cirrhotic changes.  2. The gallbladder is contracted as the patient was not n.p.o. prior to the study. Intraluminal gallstones are seen with no evidence of acute cholecystitis. Gallstones were present on the prior CT study.    ASSESSMENT AND PLAN  A 64 yr old male with Hx of Marfans, mechanical aortic valve (1999) (on anticoaggulation), aFib (s/p cardioversion), DM, hypothyroidism who presented to [facility] w/ bloody diarrhea, supratherapeutic INR and hypotension.Colonoscopy remarkable for large mass along the 1st fold of the ascending colon concerning for malignancy. S/P laproscopic right hemicolectomy 06/15/2014. Pathology revealed 3.5 cm well differentiated to moderately differentiated adenocarcinoma, resection margins free, 0/30 nodes, LVI neg, perineural  invasion neg. PT2N0, Stage 1  The vast majority of studies exploring the benefits of posttreatment surveillance have been conducted in patients with resected stage II or III disease. Whether or not intensive posttreatment surveillance is needed after resection of a stage I colon cancer is controversial, and the guidelines differ on this point. I would recommend periodic history and physical examination,. CEA testing, CT scanning and endoscopic surveillance, extrapolating from stage II and III data.    Stage 1 Colon Cancer  -On active surveillance. No signs /symptoms of recurrence. Pt is doing well clinically.   -Check labs today including CEA WNL.  -On colonoscopy 01/23/2015 a pedunculated polyp measuring 10 mm in size was found in the sigmoid colon and polypectomy was performed. Pathology is pending. Will follow.  -Ct san from today neg for disease recurrence. Repeat CT C/A/P scan in 6 months (multiple tiny nodules).   -Diet, Exercise encouraged, Vit D recommended  -Follow up every 3 months for 1st 2 years and then every 6 months during years 3, 4 and 5.     Cirrhosis of Liver  - LFTs WNL  -[place] RUQ today showed no evidence to suggest cirrhosis  - follow up with surg-onc and GI hepatology for monitoring of his cirrhosis.    Rt Hydrocele  -improved; advised to follow-up with urology.    Mechanical aortic valve (1999)   -on anticoagulation, coumadin managed by PCP.    RTC in 3 months. All questions were answered to patient's satisfaction and he was encouraged to call our office with any concerns.
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
## 2015-01-28 ENCOUNTER — Other Ambulatory Visit (HOSPITAL_BASED_OUTPATIENT_CLINIC_OR_DEPARTMENT_OTHER): Payer: Self-pay | Admitting: Student in an Organized Health Care Education/Training Program

## 2015-01-28 DIAGNOSIS — C189 Malignant neoplasm of colon, unspecified: Secondary | ICD-10-CM

## 2015-02-19 ENCOUNTER — Ambulatory Visit: Payer: Medicare Other | Attending: INTERNAL MEDICINE

## 2015-02-19 DIAGNOSIS — Z7901 Long term (current) use of anticoagulants: Secondary | ICD-10-CM

## 2015-02-19 DIAGNOSIS — Z5181 Encounter for therapeutic drug level monitoring: Secondary | ICD-10-CM | POA: Insufficient documentation

## 2015-02-19 LAB — PT/INR
INR: 2.59
PROTHROMBIN TIME: 29.3 s — ABNORMAL HIGH (ref 9.4–12.5)

## 2015-02-20 ENCOUNTER — Ambulatory Visit (INDEPENDENT_AMBULATORY_CARE_PROVIDER_SITE_OTHER): Payer: Self-pay | Admitting: INTERNAL MEDICINE

## 2015-02-20 NOTE — Progress Notes (Signed)
Pt notified of results and verbalized understanding. Pt had no questions or concerns. INR recheck scheduled 03/18/15

## 2015-02-20 NOTE — Progress Notes (Signed)
02/20/15 0800   [facility]/INR   Date INR drawn 02/20/15   INR 2.6   New warfarin dosage INR is fine   New weekly dosgae Keep the same   Comments Check in one month   Initials RTM   Anticoag Indication Other (see below)   Goal INR 2.5-3.5

## 2015-03-18 ENCOUNTER — Ambulatory Visit (INDEPENDENT_AMBULATORY_CARE_PROVIDER_SITE_OTHER): Payer: Self-pay | Admitting: INTERNAL MEDICINE

## 2015-03-18 ENCOUNTER — Ambulatory Visit: Payer: Medicare Other | Attending: INTERNAL MEDICINE

## 2015-03-18 DIAGNOSIS — Z7901 Long term (current) use of anticoagulants: Secondary | ICD-10-CM | POA: Insufficient documentation

## 2015-03-18 DIAGNOSIS — Z5181 Encounter for therapeutic drug level monitoring: Secondary | ICD-10-CM | POA: Insufficient documentation

## 2015-03-18 LAB — PT/INR
INR: 2.76
PROTHROMBIN TIME: 31.3 s — ABNORMAL HIGH (ref 9.4–12.5)
PROTHROMBIN TIME: 31.3 seconds — ABNORMAL HIGH (ref 9.4–12.5)

## 2015-03-18 NOTE — Progress Notes (Signed)
03/18/15 1600   [facility]/INR   Date INR drawn 03/18/15   INR 2.7   New warfarin dosage INR at goal   New weekly dosgae Keep the same   Comments Check INR 1 month   Initials RTM   Anticoag Indication Mechanical valve   Goal INR 2.5 - 3.5

## 2015-03-18 NOTE — Progress Notes (Signed)
Pt notified of results and verbalized understanding. Pt had no questions or concerns.  Pt currently taking Warfarin  7.5mg  daily

## 2015-04-16 ENCOUNTER — Ambulatory Visit: Payer: Medicare Other | Attending: INTERNAL MEDICINE

## 2015-04-16 DIAGNOSIS — Z5181 Encounter for therapeutic drug level monitoring: Secondary | ICD-10-CM | POA: Insufficient documentation

## 2015-04-16 DIAGNOSIS — Z7901 Long term (current) use of anticoagulants: Secondary | ICD-10-CM | POA: Insufficient documentation

## 2015-04-16 LAB — PT/INR
INR: 2.76
PROTHROMBIN TIME: 31.3 s — ABNORMAL HIGH (ref 9.4–12.5)

## 2015-04-17 ENCOUNTER — Ambulatory Visit (INDEPENDENT_AMBULATORY_CARE_PROVIDER_SITE_OTHER): Payer: Medicare Other | Admitting: INTERNAL MEDICINE

## 2015-04-17 ENCOUNTER — Ambulatory Visit (INDEPENDENT_AMBULATORY_CARE_PROVIDER_SITE_OTHER): Payer: Self-pay | Admitting: INTERNAL MEDICINE

## 2015-04-17 ENCOUNTER — Encounter (INDEPENDENT_AMBULATORY_CARE_PROVIDER_SITE_OTHER): Payer: Self-pay | Admitting: INTERNAL MEDICINE

## 2015-04-17 VITALS — BP 128/84 | HR 80 | Temp 98.6°F | Wt 272.0 lb

## 2015-04-17 DIAGNOSIS — I4891 Unspecified atrial fibrillation: Secondary | ICD-10-CM

## 2015-04-17 DIAGNOSIS — C189 Malignant neoplasm of colon, unspecified: Secondary | ICD-10-CM

## 2015-04-17 DIAGNOSIS — R609 Edema, unspecified: Secondary | ICD-10-CM

## 2015-04-17 MED ORDER — FUROSEMIDE 40 MG TABLET
40.0000 mg | ORAL_TABLET | Freq: Two times a day (BID) | ORAL | 3 refills | Status: DC
Start: 2015-04-17 — End: 2016-04-16

## 2015-04-17 MED ORDER — WARFARIN 5 MG TABLET
5.0000 mg | ORAL_TABLET | Freq: Every day | ORAL | 3 refills | Status: DC
Start: 2015-04-17 — End: 2015-12-11

## 2015-04-17 MED ORDER — SPIRONOLACTONE 50 MG TABLET
ORAL_TABLET | ORAL | 3 refills | Status: DC
Start: 2015-04-17 — End: 2016-12-14

## 2015-04-17 NOTE — Progress Notes (Signed)
04/17/15 0800   [facility]/INR   Date INR drawn 04/16/15   INR 2.8   New warfarin dosage INR is at goal   New weekly dosgae Keep the same   Comments Check INR in 1 month   Initials RTM   Anticoag Indication Mechanical valve   Goal INR 2.5-3.5
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
## 2015-04-17 NOTE — Progress Notes (Unsigned)
Pt notified of results and verbalized understanding. Pt has F/U lab appt on 05/20/2015.

## 2015-04-17 NOTE — Progress Notes (Signed)
Blood pressure 128/84, pulse 80, temperature 37 C (98.6 F), weight 123.4 kg (272 lb), SpO2 98 %.    Gretchen Short, MA  04/17/2015, 08:55

## 2015-04-21 ENCOUNTER — Other Ambulatory Visit (INDEPENDENT_AMBULATORY_CARE_PROVIDER_SITE_OTHER): Payer: Self-pay | Admitting: INTERNAL MEDICINE

## 2015-04-24 ENCOUNTER — Other Ambulatory Visit (HOSPITAL_BASED_OUTPATIENT_CLINIC_OR_DEPARTMENT_OTHER): Payer: Self-pay | Admitting: Student in an Organized Health Care Education/Training Program

## 2015-04-24 DIAGNOSIS — C189 Malignant neoplasm of colon, unspecified: Secondary | ICD-10-CM

## 2015-04-26 ENCOUNTER — Ambulatory Visit (HOSPITAL_BASED_OUTPATIENT_CLINIC_OR_DEPARTMENT_OTHER): Payer: Medicare Other | Admitting: Student in an Organized Health Care Education/Training Program

## 2015-04-26 ENCOUNTER — Ambulatory Visit
Admission: RE | Admit: 2015-04-26 | Discharge: 2015-04-26 | Disposition: A | Discharge status: Home / Self Care | Payer: Medicare Other | Source: Ambulatory Visit | Attending: Student in an Organized Health Care Education/Training Program | Admitting: Student in an Organized Health Care Education/Training Program

## 2015-04-26 ENCOUNTER — Encounter (HOSPITAL_BASED_OUTPATIENT_CLINIC_OR_DEPARTMENT_OTHER): Payer: Self-pay | Admitting: Student in an Organized Health Care Education/Training Program

## 2015-04-26 DIAGNOSIS — K219 Gastro-esophageal reflux disease without esophagitis: Secondary | ICD-10-CM | POA: Insufficient documentation

## 2015-04-26 DIAGNOSIS — Z08 Encounter for follow-up examination after completed treatment for malignant neoplasm: Secondary | ICD-10-CM | POA: Insufficient documentation

## 2015-04-26 DIAGNOSIS — J069 Acute upper respiratory infection, unspecified: Secondary | ICD-10-CM | POA: Insufficient documentation

## 2015-04-26 DIAGNOSIS — Z801 Family history of malignant neoplasm of trachea, bronchus and lung: Secondary | ICD-10-CM | POA: Insufficient documentation

## 2015-04-26 DIAGNOSIS — C189 Malignant neoplasm of colon, unspecified: Secondary | ICD-10-CM

## 2015-04-26 DIAGNOSIS — Z8379 Family history of other diseases of the digestive system: Secondary | ICD-10-CM | POA: Insufficient documentation

## 2015-04-26 DIAGNOSIS — I482 Chronic atrial fibrillation: Secondary | ICD-10-CM | POA: Insufficient documentation

## 2015-04-26 DIAGNOSIS — R5383 Other fatigue: Secondary | ICD-10-CM | POA: Insufficient documentation

## 2015-04-26 DIAGNOSIS — K432 Incisional hernia without obstruction or gangrene: Secondary | ICD-10-CM | POA: Insufficient documentation

## 2015-04-26 DIAGNOSIS — Z86010 Personal history of colonic polyps: Secondary | ICD-10-CM | POA: Insufficient documentation

## 2015-04-26 DIAGNOSIS — I509 Heart failure, unspecified: Secondary | ICD-10-CM | POA: Insufficient documentation

## 2015-04-26 DIAGNOSIS — E039 Hypothyroidism, unspecified: Secondary | ICD-10-CM | POA: Insufficient documentation

## 2015-04-26 DIAGNOSIS — Z9049 Acquired absence of other specified parts of digestive tract: Secondary | ICD-10-CM | POA: Insufficient documentation

## 2015-04-26 DIAGNOSIS — K7460 Unspecified cirrhosis of liver: Secondary | ICD-10-CM | POA: Insufficient documentation

## 2015-04-26 DIAGNOSIS — Z85038 Personal history of other malignant neoplasm of large intestine: Secondary | ICD-10-CM | POA: Insufficient documentation

## 2015-04-26 DIAGNOSIS — M6281 Muscle weakness (generalized): Secondary | ICD-10-CM | POA: Insufficient documentation

## 2015-04-26 DIAGNOSIS — E119 Type 2 diabetes mellitus without complications: Secondary | ICD-10-CM | POA: Insufficient documentation

## 2015-04-26 DIAGNOSIS — Z8249 Family history of ischemic heart disease and other diseases of the circulatory system: Secondary | ICD-10-CM | POA: Insufficient documentation

## 2015-04-26 DIAGNOSIS — Z7901 Long term (current) use of anticoagulants: Secondary | ICD-10-CM | POA: Insufficient documentation

## 2015-04-26 DIAGNOSIS — Z952 Presence of prosthetic heart valve: Secondary | ICD-10-CM | POA: Insufficient documentation

## 2015-04-26 DIAGNOSIS — N189 Chronic kidney disease, unspecified: Secondary | ICD-10-CM | POA: Insufficient documentation

## 2015-04-26 DIAGNOSIS — Z79899 Other long term (current) drug therapy: Secondary | ICD-10-CM | POA: Insufficient documentation

## 2015-04-26 LAB — COMPREHENSIVE METABOLIC PANEL, NON-FASTING
ALBUMIN: 3.7 g/dL (ref 3.4–4.8)
ALKALINE PHOSPHATASE: 86 U/L (ref <150)
ALT (SGPT): 17 U/L (ref <55)
ALT (SGPT): 17 U/L (ref <55)
ANION GAP: 9 mmol/L (ref 4–13)
AST (SGOT): 22 U/L (ref 8–48)
BILIRUBIN TOTAL: 0.9 mg/dL (ref 0.3–1.3)
BUN/CREA RATIO: 10 (ref 6–22)
BUN: 13 mg/dL (ref 8–25)
BUN: 13 mg/dL (ref 8–25)
CALCIUM: 9.3 mg/dL (ref 8.5–10.4)
CHLORIDE: 99 mmol/L (ref 96–111)
CO2 TOTAL: 27 mmol/L (ref 22–32)
CREATININE: 1.27 mg/dL (ref 0.62–1.27)
ESTIMATED GFR: >59 mL/min/1.73mˆ2 (ref >59)
GLUCOSE: 145 mg/dL — ABNORMAL HIGH (ref 65–139)
POTASSIUM: 4.7 mmol/L (ref 3.5–5.1)
PROTEIN TOTAL: 8.6 g/dL — ABNORMAL HIGH (ref 6.0–8.0)
SODIUM: 135 mmol/L — ABNORMAL LOW (ref 136–145)

## 2015-04-26 LAB — CBC WITH DIFF
BASOPHIL #: 0.04 10*3/uL (ref 0.00–0.20)
BASOPHIL #: 0.04 x10ˆ3/uL (ref 0.00–0.20)
BASOPHIL %: 1 %
EOSINOPHIL #: 0.03 x10ˆ3/uL (ref 0.00–0.50)
EOSINOPHIL %: 0 %
HCT: 45.2 % (ref 36.7–47.0)
HGB: 14.7 g/dL (ref 12.5–16.3)
LYMPHOCYTE #: 1.02 x10ˆ3/uL (ref 1.00–4.80)
LYMPHOCYTE %: 13 %
MCH: 28.2 pg (ref 27.4–33.0)
MCHC: 32.4 g/dL — ABNORMAL LOW (ref 32.5–35.8)
MCV: 86.9 fL (ref 78.0–100.0)
MONOCYTE #: 0.54 x10ˆ3/uL (ref 0.30–1.00)
MONOCYTE %: 7 %
MPV: 9 fL (ref 7.5–11.5)
NEUTROPHIL #: 6.33 x10ˆ3/uL (ref 1.50–7.70)
NEUTROPHIL %: 80 %
PLATELETS: 211 x10ˆ3/uL (ref 140–450)
RBC: 5.2 x10ˆ6/uL (ref 4.06–5.63)
RDW: 16.7 % — ABNORMAL HIGH (ref 12.0–15.0)
WBC: 8 x10ˆ3/uL (ref 3.5–11.0)

## 2015-04-26 LAB — PLATELETS AND ANC CANCER CENTER
PLATELET COUNT (AUTO): 211 x10?3/uL (ref 140–450)
PLATELET COUNT (AUTO): 211 x10ˆ3/uL (ref 140–450)
PMN ABS (AUTO): 6.33 x10ˆ3/uL (ref 1.50–7.70)

## 2015-04-26 LAB — CARCINOEMBRYONIC ANTIGEN: CEA: 1.8 ng/mL (ref <=5.0)

## 2015-04-26 NOTE — Cancer Center Note (Signed)
PRINCIPAL DIAGNOSIS: pT2N0 Stage I colon cancer    ONCOLOGICAL HISTORY:  April 2016-Large right hydrocele that was drained. He gained 60 lbs of water weight post-operatively and was put on lasix. There was concern for infection too and he was started on antibiotocs as well.   Jul 09, 2014. He presented with acute onset of rectal bleeding with bloody diarrhea and generalized weakness. He has been on anticoagulation for chronic atrial fibrillation aand St Jude metal valve replacement in 1999.  His INR shot up to 10.6 at that time from antibiotics & coumadin interaction  Jul 10, 2014, Colonoscopy was performed. It showed large mass on 1st fold rigth colon covering 1/3wall circumference. Clean, superficial ulcer overlying mass. No active bleeding on initial inspection. Multiple biopsies obtained with cold biopsy forceps. Pathology of ascending colon mass revealed adenocarcinoma  Jun 15, 2014. He underwent laproscopic right hemicolectomy. Pathology revealed moderately differentiated adenocarcinoma, resection margins free, 0/30 nodes, LVI neg, perineural invasion neg. pT2N0  Jan 23, 2015. Colonoscopy revealed a pedunculated polyp measuring 10 mm in size in the sigmoid colon. A polypectomy was performed using snare cautery. The resection was complete, the polyp tissue was completely retrieved. Pathology c/w tubulovillous adenoma    INTERIM HISTORY:  A 65 yr old male with Hx of Marfans, mechanical aortic valve (1999) (on anticoagulation), aFib (s/p cardioversion), DM, hypothyroidism who presented to [facility] w/ bloody diarrhea, supratherapeutic INR and hypotension.Colonoscopy remarkable for large mass along the 1st fold of the ascending colon concerning for malignancy. S/P laproscopic right hemicolectomy 06/15/2014. PT2N0, Stage 1 colon cancer. He is on active surveillance. He presents to office today for 94-month follow up. He is doing well, except more discomfort from his abdominal hernia. He often notices bulge and  discomfort after eating and activity. It is reducible. He denies any N/V. He is moving his bowels regularly, denies any diarrhea or constipation, or blood per rectum.  Last colonoscopy in Dec 2016 revealed a 10 mm tubulovillous adenoma in sigmoid colon which was resected. Appetite is good. He continues to be on lasix 40 mg bid.  He is recovering from a viral URI. He also follows with his PCP in [place].    REVIEW OF SYSTEMS:  General: (-) pain. (-) fevers (-) chills. (-) weight loss. (+) fatigue.  Lymphatic: (-) palpable masses. (-) night sweats.  Heme: (-) easy bruising (-) bleeding. (-) recurrent infections.   HEENT. (-) vision changes (-) hearing changes. (-) dysphagia. (-) sore throat.   Heart: (+) mechanical valve (-) chest pain. (-) palpitation. (-) orthopnea. (-) LE edema.   Lungs: (-) dyspnea (on exertion) (-) hemoptysis. (-) cough.   Abdomen: (-) poor appetite. (+) abdominal pain. (-) nausea (-) vomiting. (-) diarrhea. (-) constipation.   GU: (-) dysuria (-) Urgency. (-) Hematuria.   MS. (-) joint pain (-) ext swelling. (-) Back pain.   Dermatologic: (-) rashes.    Psychiatric: (-) Depression. (-) anxiety. (-) insomnia.   Neurologic: (-) headaches. (-) neuropathy. (-) weakness. (-) memory problems.  Endocrine:(+) hypothyroidism  Other review of systems negative.       Past Medical History  Current Outpatient Prescriptions   Medication Sig    docusate sodium (COLACE) 100 mg Oral Capsule Take 1 Cap (100 mg total) by mouth Twice daily    furosemide (LASIX) 20 mg Oral Tablet TAKE THREE TABLETS BY MOUTH TWO TIMES A DAY    furosemide (LASIX) 40 mg Oral Tablet Take 1 Tab (40 mg total) by mouth Twice daily  levothyroxine (SYNTHROID) 200 mcg Oral Tablet Take 1 Tab (200 mcg total) by mouth Once a day    MAGNESIUM CARBONATE ORAL Take by mouth Once a day    POTASSIUM GLUCONATE ORAL Take by mouth Once a day    Sildenafil (VIAGRA) 100 mg Oral Tablet Take 1 Tab (100 mg total) by mouth Every 24 hours  as needed    spironolactone (ALDACTONE) 50 mg Oral Tablet TAKE ONE TABLET BY MOUTH ONE TIME DAILY    warfarin (COUMADIN) 5 mg Oral Tablet Take 1 Tab (5 mg total) by mouth Once a day     Allergies   Allergen Reactions    Allegra [Fexofenadine]  Other Adverse Reaction (Add comment)     "retain fluid"     Past Medical History:   Diagnosis Date    A-fib (HCC)     Chronic anti-coagulant therapy    Acute on chronic renal failure (HCC)     Cancer (HCC)     Cirrhosis of liver (HCC)     Congestive heart failure (HCC)     Diabetes mellitus ([place])     type ii diet controlled    Hemorrhage of gastrointestinal tract 5/16    Hemorrhagic shock (HCC)     Hydrocele     Hypertension 07/10/2014    Marfan syndrome     Muscle weakness     Generalized    Reflux     Syncope     Thyroid disease     hypothyroid    Transfusion history     s/p Multiple units PRC/FFP this adm.    Valvular disease     Wears glasses          Past Surgical History:   Procedure Laterality Date    COLONOSCOPY      HX COLONOSCOPY      HX HEART CATHETERIZATION      HX HEART VALVE SURGERY  1999    st judes     HX HEMICOLECTOMY Right 07/2014    Laparoscopic right hemicolectomy for colon cancer    HX TONSIL AND ADENOIDECTOMY      HX TONSILLECTOMY      HX UPPER ENDOSCOPY  07/09/14    HX WISDOM TEETH EXTRACTION           Family History   Problem Relation Age of Onset    Hypertension Mother     Lung Cancer Father     Cirrhosis Brother          Social History     Social History    Marital status: Married     Spouse name: N/A    Number of children: N/A    Years of education: N/A     Social History Main Topics    Smoking status: Never Smoker    Smokeless tobacco: Never Used    Alcohol use No    Drug use: No    Sexual activity: Not on file     Other Topics Concern    Routine Exercise Yes    Ability To Walk 2 Flight Of Steps Without Sob/Cp Yes    Unable To Ambulate No    Total Care No    Ability To Do Own Adl's Yes    Uses Walker No       Uses Cane No     Social History Narrative     Married, lives with wife,. Has 4 children. One daughter has Marfan's syndrome. 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  HEENT: Normocephalic, atraumatic, EOMI, sclera anicteric, oropharynx moist and pink, neck supple, trachea midline .  Respiratory: Clear to ausculatation bilaterally,no wheezes,crackles, rhonchi or rales.  Cardiovascular: RRR, no murmurs, rubs, gallops, no JVD, 2+ radial pulses bilaterally.  Gastrointestinal: Soft nontender, nondistended, no abdominal masses noted.  A hernia at the superior aspect of his incision. It is reducible. Incision has healed well.  Lymphatics: No palpable lymphadenopathy.  Musculoskeletal: 5/5 strength in the upper and and 5/5 in lower extremities.  Skin: Non jaundiced, no rashes.  Neurologic: Cranial nerves II-XII grossly intact. Non-focal  Psychiatric: Normal affect, good insight.    LABS from today   Ref. Range 04/26/2015 12:11   WBC Latest Ref Range: 3.5 - 11.0 x103/uL 8.0   HGB Latest Ref Range: 12.5 - 16.3 g/dL 14.7   HCT Latest Ref Range: 36.7 - 47.0 % 45.2   PLATELET COUNT (AUTO) Latest Ref Range: 140 - 450 x103/uL 211   PLATELET COUNT Latest Ref Range: 140 - 450 x103/uL 211   RBC Latest Ref Range: 4.06 - 5.63 x106/uL 5.20   MCV Latest Ref Range: 78.0 - 100.0 fL 86.9   MCHC Latest Ref Range: 32.5 - 35.8 g/dL 32.4 (L)   MCH Latest Ref Range: 27.4 - 33.0 pg 28.2   RDW Latest Ref Range: 12.0 - 15.0 % 16.7 (H)   MPV Latest Ref Range: 7.5 - 11.5 fL 9.0   PMN'S Latest Units: % 80   LYMPHOCYTES Latest Units: % 13   EOSINOPHIL Latest Units: % 0   MONOCYTES Latest Units: % 7   BASOPHILS Latest Units: % 1   PMN ABS (AUTO) Latest Ref Range: 1.50 - 7.70 x103/uL 6.33   PMN ABS Latest Ref Range: 1.50 - 7.70 x103/uL 6.33   LYMPHS ABS Latest Ref Range: 1.00 - 4.80 x103/uL 1.02   EOS ABS Latest Ref Range: 0.00 - 0.50  x103/uL 0.03   MONOS ABS Latest Ref Range: 0.30 - 1.00 x103/uL 0.54   BASOS ABS Latest Ref Range: 0.00 - 0.20 x103/uL 0.04   SODIUM Latest Ref Range: 136 - 145 mmol/L 135 (L)   POTASSIUM Latest Ref Range: 3.5 - 5.1 mmol/L 4.7   CHLORIDE Latest Ref Range: 96 - 111 mmol/L 99   CARBON DIOXIDE Latest Ref Range: 22 - 32 mmol/L 27   BUN Latest Ref Range: 8 - 25 mg/dL 13   CREATININE Latest Ref Range: 0.62 - 1.27 mg/dL 1.27   GLUCOSE Latest Ref Range: 65 - 139 mg/dL 145 (H)   ANION GAP Latest Ref Range: 4 - 13 mmol/L 9   BUN/CREAT RATIO Latest Ref Range: 6 - 22  10   ESTIMATED GLOMERULAR FILTRATION RATE Latest Ref Range: >59 mL/min/1.52m2 >59   CALCIUM Latest Ref Range: 8.5 - 10.4 mg/dL 9.3   TOTAL PROTEIN Latest Ref Range: 6.0 - 8.0 g/dL 8.6 (H)   ALBUMIN Latest Ref Range: 3.4 - 4.8 g/dL 3.7   BILIRUBIN, TOTAL Latest Ref Range: 0.3 - 1.3 mg/dL 0.9   AST (SGOT) Latest Ref Range: 8 - 48 U/L 22   ALT (SGPT) Latest Ref Range: <55 U/L 17   ALKALINE PHOSPHATASE Latest Ref Range: <150 U/L 86   CARCINOEMBRYONIC AG Latest Ref Range: <=5.0 ng/mL 1.8       PROCEDURE:  COLONOSCOPY 01/23/2015 FINDINGS: 1. COLON FINDINGS: A pedunculated polyp measuring 10 mm in size was  found in  the sigmoid colon. A polypectomy was performed using snare cautery.  The resection was complete, the polyp tissue was completely retrieved and sent  to histology.  2. Mild diverticulosis was noted in the sigmoid colon.  3. There was evidence of a normal appearing prior surgical anastomosis in the  ascending colon. Retroflexed views in the rectum revealed internal  hemorrhoids.   The scope was then withdrawn from the patient and the  procedure terminated. Vital signs were monitored constantly through out the  Procedure.      PATHOLOGY REVIEW  Final Pathologic Diagnosis    RIGHT COLON, HEMICOLECTOMY:    - Moderately differentiated adenocarcinoma. (See Cancer Case Summary.)    - Resection margins are free of neoplasm.    - Thirty lymph  nodes identified, all negative for malignancy (0/30).    - Pathologic staging: pT2 N0.    - Appendix with no diagnostic abnormalities recognized.          CAP CANCER CASE SUMMARY: COLON    Specimen: Terminal ileum, cecum, appendix and ascending colon.  Procedure: Right hemicolectomy.  Tumor site: Right ascending colon.  Tumor size: Greatest dimension: 3.5 cm.  Macroscopic tumor perforation: Not identified.  Histologic type: Adenocarcinoma.  Histologic grade: Low-grade (well-differentiated to moderately differentiated).  Microscopic tumor extension: Tumor invades muscularis propria.  Margins: All margins uninvolved by invasive carcinoma.    Distance of invasive carcinoma from closest margin: 5.5 cm from proximal  margin.  Lymph-vascular invasion: Not identified.  Perineural invasion: Not identified.  Tumor deposits: Not identified.  Pathologic staging: pT2, pN0.    Number of lymph nodes examined: 44.    Number of lymph nodes involved: 0.  Additional pathologic findings: None identified.        IMAGING  1) CT 07/11/2014  IMPRESSION:   1.Small bilateral pleural effusions. 2 pulmonary nodules, largest measuring 4 mm. Followup with CT chest in 6-12 months is recommended.  2. Cholelithiasis with mild gallbladder wall thickening. If there is concern for acute cholecystitis further evaluation with ultrasound is recommended.  3. Patient's known cecal mass is not visualized. No evidence of obstruction. Diverticulosis without evidence for complicated disease.  4. Mild nonspecific mesenteric stranding and minimal free fluid within the pelvis.    2) CT CHEST ABDOMEN PELVIS W IV CONTRAST 01/24/2015    CHEST:  HEART/MEDIASTINUM/ESOPHAGUS: Heart size is normal. There is no pericardial effusion. There are mild coronary calcifications. Diameter of the aorta and pulmonary trunk is normal. Multiple subcentimeter mediastinal and right hilar lymph nodes are seen.      LUNGS/PLEURA: 3 mm nodular region on series 10 image 42 likely represents a vessel en face. There is a 2 mm nodule right upper lobe on series 10 image 54. Additional 2 mm nodule right upper lobe on series 10 image 71 identified and likely calcified granuloma is seen on series 4 image 69 within the right middle lobe measuring 4 mm. There are multiple additional 2 mm nodules within the right middle and lower lobes as well as few tiny calcified nodules likely representing granulomas. Additionally, there are multiple approximately 2 mm nodules scattered throughout the left lung and tiny calcified granulomas. There is no pneumothorax or pleural effusion.    OTHER: Prominent right cervical nodes are noted, maximum dimension measuring 1.4 cm. The thyroid is unremarkable. Sclerotic lesion within the distal right clavicle on series 3 image 14 likely represents a bone island.    ABDOMEN/PELVIS:  LIVER: There is a nonspecific 4 mm linear hypoattenuation within the  left hepatic lobe seen on series 5 image 12 which likely represents a bile duct. The liver size is normal. There is a mildly dysmorphic liver with no overt signs of cirrhosis.    BILIARY SYSTEM/GALLBLADDER: There is no intrahepatic or extrahepatic bile duct dilation. Cholelithiasis is identified, with no evidence of gallbladder distention or pericholecystic fluid.    PANCREAS: There is mild fatty replacement of the pancreas. No peripancreatic stranding or pancreatic duct dilatation is identified.    STOMACH: The stomach is incompletely distended, limiting evaluation for wall thickening.    SPLEEN: The spleen is of normal size and enhances homogenously.    ADRENALS: No adrenal nodules are seen.    KIDNEYS/URETER/BLADDER: No renal masses or renal calculi are visualized. There is no hydronephrosis. The bilateral ureters are of normal caliber. Bladder is incompletely distended, limiting evaluation for wall thickening.    REPRODUCTIVE ORGANS: The prostate is  of normal size and the seminal vesicles are symmetric.    BOWEL: Postsurgical changes from right hemicolectomy are identified. Anastomotic site within the right abdomen is seen , with evidence of few subcentimeter shotty mesenteric lymph nodes in this region as well as any nodular density on series 14 image 40 which could represent lymph nodes or postsurgical scarring. There is no evidence of bowel obstruction. Evaluation for bowel wall thickening is limited due to incomplete distention. There is evidence of uncomplicated sigmoid diverticulosis.    VASCULATURE/LYMPH NODES: The aorta demonstrates mild atherosclerotic disease. The celiac, superior mesenteric, bilateral renal arteries and inferior mesenteric artery are patent. The portal vein and the SMV are unremarkable. There are multiple subcentimeter celiac axis and periportal lymph nodes as well as multiple subcentimeter periaortic lymph nodes. There are upper limits of normal in size bilateral inguinal lymph nodes, the largest is in the right inguinal canal measuring 1.4 x 1.2 cm which appears to contain a fatty hilum and is likely benign.    PERITONEUM / RETROPERITONEUM: No free air or fluid is identified within the abdomen or pelvis.    BONES/SOFT TISSUES: There is a sclerotic rounded lesion within the T5 vertebral body, which likely represents a bone island. There is a left inguinal hernia which contains a segment of the sigmoid colon. There is no bowel obstruction. Diffuse degenerative changes are seen in the spine. No suspicious osseous lesions are visualized. There is a tiny fat containing umbilical hernia.  IMPRESSION:   1. Postsurgical changes from right hemicolectomy for colonic adenocarcinoma with nodular density adjacent to the anastomotic site which could represent shotty lymph nodes or scarring. Close attention on followup is recommended.  2. Left inguinal hernia containing a segment of sigmoid colon with no evidence of bowel obstruction.  3.  Multiple tiny pulmonary nodules. Chest CT is recommended in 6 months for followup.   4. Prominent right cervical lymph nodes, which are nonspecific and could be reactive. Clinical correlation is recommended.   3) [place] RT UPPER QUADRANT 12/15 2016   FINDINGS:     The pancreas is not seen on today's study    The size of the liver is within normal limits, measuring 17.0 cm along the longitudinal axis of the right hepatic lobe. Hepatic echogenicity is within normal limits with no evidence of focal lesions or fatty change.   Doppler demonstrates the portal vein and hepatic veins to be patent with appropriate direction of flow. There is no evidence of intra-/extrahepatic biliary duct dilation. The common bile duct measures 2.7 mm.    The gallbladder is contracted as  the patient was not NPO prior to the study. Intraluminal gallstones are identified with corresponding posterior shadowing. No adjacent fluid is seen to suggest acute cholecystitis.    The right kidney measures 11.0 x 7.1 cm. There is no evidence of hydronephrosis.    IMPRESSION:  1. Unremarkable ultrasound evaluation of the liver with no evidence to suggest cirrhotic changes.  2. The gallbladder is contracted as the patient was not n.p.o. prior to the study. Intraluminal gallstones are seen with no evidence of acute cholecystitis. Gallstones were present on the prior CT study.    ASSESSMENT AND PLAN  A 65 yr old male with Hx of Marfans, mechanical aortic valve (1999) (on anticoaggulation), aFib (s/p cardioversion), DM, hypothyroidism who presented to [facility] w/ bloody diarrhea, supratherapeutic INR and hypotension.Colonoscopy remarkable for large mass along the 1st fold of the ascending colon concerning for malignancy. S/P laproscopic right hemicolectomy 06/15/2014. Pathology revealed 3.5 cm well differentiated to moderately differentiated adenocarcinoma, resection margins free, 0/30 nodes, LVI neg, perineural invasion neg. PT2N0, Stage 1  The vast majority of  studies exploring the benefits of posttreatment surveillance have been conducted in patients with resected stage II or III disease. Whether or not intensive posttreatment surveillance is needed after resection of a stage I colon cancer is controversial, and the guidelines differ on this point. I recommended periodic history and physical examination,. CEA testing, CT scanning and endoscopic surveillance, extrapolating from stage II and III data.    Stage 1 Colon Cancer  -On active surveillance. No signs /symptoms of recurrence. Pt is doing well clinically.   -Check labs today including CEA WNL.  -On colonoscopy 01/23/2015 a pedunculated polyp measuring 10 mm in size was found in the sigmoid colon and polypectomy was performed. Pathology revealed Tubulovillous adenoma. Will repeat colonoscopy in 01/2016  -Ct Scan 01/24/2016 neg for disease recurrence. Repeat CT C/A/P scan in June 2017 to follow-up on multiple tiny pulm nodules and investigate hernia.  -Diet, Exercise encouraged, Vit D recommended  -Follow up every 3 months for 1st 2 years and then every 6 months during years 3, 4 and 5.     Incisional Hernia  -Refer to Dr. Catarina Hartshorn for consideration of hernia repair.    Cirrhosis of Liver  -LFTs WNL  -[place] RUQ 01/24/2015 showed no evidence to suggest cirrhosis. Previous US Liver in 06/2014 reported cirrhosis of liver. He has no clinical stigmata of cirrhosis  -Will repeat US Liver on next visit  -Will have him follow with GI hepatology for cirrhosis if needed    Mechanical aortic valve (1999)   -on anticoagulation, coumadin managed by PCP. Last one 2.7    RTC in 3 months. All questions were answered to patient's satisfaction and he was encouraged to call our office with any concerns.

## 2015-05-20 ENCOUNTER — Other Ambulatory Visit: Payer: Self-pay

## 2015-06-24 ENCOUNTER — Ambulatory Visit: Payer: Medicare Other | Attending: INTERNAL MEDICINE

## 2015-06-24 DIAGNOSIS — Z5181 Encounter for therapeutic drug level monitoring: Secondary | ICD-10-CM | POA: Insufficient documentation

## 2015-06-24 DIAGNOSIS — Z7901 Long term (current) use of anticoagulants: Secondary | ICD-10-CM | POA: Insufficient documentation

## 2015-06-24 LAB — PT/INR
INR: 3.13
PROTHROMBIN TIME: 35.7 s — ABNORMAL HIGH (ref 9.4–12.5)

## 2015-06-25 ENCOUNTER — Ambulatory Visit (INDEPENDENT_AMBULATORY_CARE_PROVIDER_SITE_OTHER): Payer: Self-pay | Admitting: INTERNAL MEDICINE

## 2015-06-25 NOTE — Progress Notes (Unsigned)
Left message for patient to call back for results.  

## 2015-06-25 NOTE — Progress Notes (Signed)
06/25/15 1100   [facility] PT/INR   Date INR drawn 06/25/15   INR 3.0   New warfarin dosage Keep the same   New weekly dosgae INR is at goal   Initials RTM   Goal INR 2.5 - 3.5

## 2015-06-25 NOTE — Progress Notes (Unsigned)
Pt notified of INR results and verbalized understanding. Pt has F/U lab appt on 07/25/2015.

## 2015-07-21 ENCOUNTER — Other Ambulatory Visit (INDEPENDENT_AMBULATORY_CARE_PROVIDER_SITE_OTHER): Payer: Self-pay | Admitting: INTERNAL MEDICINE

## 2015-07-25 ENCOUNTER — Ambulatory Visit (INDEPENDENT_AMBULATORY_CARE_PROVIDER_SITE_OTHER): Payer: Self-pay | Admitting: INTERNAL MEDICINE

## 2015-07-25 ENCOUNTER — Ambulatory Visit: Payer: Medicare Other | Attending: INTERNAL MEDICINE

## 2015-07-25 DIAGNOSIS — Z7901 Long term (current) use of anticoagulants: Secondary | ICD-10-CM | POA: Insufficient documentation

## 2015-07-25 DIAGNOSIS — Z5181 Encounter for therapeutic drug level monitoring: Secondary | ICD-10-CM | POA: Insufficient documentation

## 2015-07-25 LAB — PT/INR
INR: 2.67
PROTHROMBIN TIME: 30.3 s — ABNORMAL HIGH (ref 9.4–12.5)

## 2015-07-25 NOTE — Nursing Note (Signed)
07/25/15 1200   [facility] PT/INR   Date INR drawn 07/25/15   INR 2.6   New warfarin dosage INR is at goal   New weekly dosgae Keep the same   Comments Check in one month   Initials RTM   Anticoag Indication valve   Goal INR 2.5-3.5

## 2015-07-25 NOTE — Progress Notes (Signed)
Pt notified of results and verbalized understanding. Pt had no questions or concerns.  INR 08/19/15

## 2015-07-31 ENCOUNTER — Other Ambulatory Visit (INDEPENDENT_AMBULATORY_CARE_PROVIDER_SITE_OTHER): Payer: Self-pay

## 2015-07-31 ENCOUNTER — Other Ambulatory Visit (HOSPITAL_COMMUNITY): Payer: Self-pay

## 2015-08-01 ENCOUNTER — Other Ambulatory Visit (HOSPITAL_COMMUNITY): Payer: Self-pay

## 2015-08-01 ENCOUNTER — Encounter (HOSPITAL_BASED_OUTPATIENT_CLINIC_OR_DEPARTMENT_OTHER): Payer: Self-pay | Admitting: Surgery

## 2015-08-01 ENCOUNTER — Ambulatory Visit (HOSPITAL_BASED_OUTPATIENT_CLINIC_OR_DEPARTMENT_OTHER): Payer: Medicare Other

## 2015-08-01 ENCOUNTER — Ambulatory Visit (HOSPITAL_BASED_OUTPATIENT_CLINIC_OR_DEPARTMENT_OTHER): Payer: Medicare Other | Admitting: Student in an Organized Health Care Education/Training Program

## 2015-08-01 ENCOUNTER — Encounter (HOSPITAL_BASED_OUTPATIENT_CLINIC_OR_DEPARTMENT_OTHER): Payer: Self-pay

## 2015-08-01 ENCOUNTER — Other Ambulatory Visit (INDEPENDENT_AMBULATORY_CARE_PROVIDER_SITE_OTHER): Payer: Self-pay

## 2015-08-08 ENCOUNTER — Other Ambulatory Visit (HOSPITAL_COMMUNITY): Payer: Self-pay

## 2015-08-08 ENCOUNTER — Ambulatory Visit (HOSPITAL_BASED_OUTPATIENT_CLINIC_OR_DEPARTMENT_OTHER): Payer: Medicare Other | Admitting: Surgery

## 2015-08-08 ENCOUNTER — Other Ambulatory Visit (INDEPENDENT_AMBULATORY_CARE_PROVIDER_SITE_OTHER): Payer: Self-pay

## 2015-08-08 ENCOUNTER — Encounter (HOSPITAL_BASED_OUTPATIENT_CLINIC_OR_DEPARTMENT_OTHER): Payer: Self-pay

## 2015-08-08 ENCOUNTER — Ambulatory Visit (HOSPITAL_BASED_OUTPATIENT_CLINIC_OR_DEPARTMENT_OTHER): Payer: Medicare Other

## 2015-08-08 ENCOUNTER — Encounter (HOSPITAL_BASED_OUTPATIENT_CLINIC_OR_DEPARTMENT_OTHER): Payer: Medicare Other | Admitting: Student in an Organized Health Care Education/Training Program

## 2015-08-16 ENCOUNTER — Ambulatory Visit (HOSPITAL_BASED_OUTPATIENT_CLINIC_OR_DEPARTMENT_OTHER): Payer: Medicare Other | Admitting: Student in an Organized Health Care Education/Training Program

## 2015-08-16 ENCOUNTER — Ambulatory Visit (HOSPITAL_BASED_OUTPATIENT_CLINIC_OR_DEPARTMENT_OTHER): Payer: Medicare Other

## 2015-08-19 ENCOUNTER — Ambulatory Visit: Payer: Medicare Other | Attending: INTERNAL MEDICINE

## 2015-08-19 DIAGNOSIS — Z7901 Long term (current) use of anticoagulants: Principal | ICD-10-CM | POA: Insufficient documentation

## 2015-08-19 LAB — PT/INR
INR: 3.18
PROTHROMBIN TIME: 36.3 s — ABNORMAL HIGH (ref 9.4–12.5)

## 2015-08-20 ENCOUNTER — Ambulatory Visit (INDEPENDENT_AMBULATORY_CARE_PROVIDER_SITE_OTHER): Payer: Self-pay | Admitting: INTERNAL MEDICINE

## 2015-08-20 NOTE — Progress Notes (Signed)
Pt notified of results and verbalized understanding. Pt had no questions or concerns. INR recheck 09/18/15

## 2015-08-20 NOTE — Nursing Note (Signed)
08/20/15 1100   [facility] PT/INR   Date INR drawn 08/20/15   INR 3.2   New warfarin dosage INR is at goal   New weekly dosgae Keep the same   Comments Check INR in one month   Initials RTM   Anticoag Indication mechanical valve   Goal INR 2.5-3.5

## 2015-08-26 ENCOUNTER — Telehealth (HOSPITAL_BASED_OUTPATIENT_CLINIC_OR_DEPARTMENT_OTHER): Payer: Self-pay | Admitting: Hematology & Oncology

## 2015-08-26 NOTE — Telephone Encounter (Signed)
NP for Dr Zenda Alpers. Hx of colon cancer and he is on long term warfarin because he has an artificial heart valve. Dr.Ryan Anabel Bene is his pcp. He was being see at [facility] but wants to transfer to here because of the distance. He had only one cancerous polyp about a year ago. He had 30 nodes removed that were not cancerous. His Hem/Onc in [place] is Dr. Ardeth Sportsman. Last Dec.  He had another colonoscopy which showed nothing. He did not have chemo, etc. He will also be in need of another colonoscopy soon. And wants that done locally also. I am thinking we should be able to see what is needed in the compter system . If Dr. Zenda Alpers should need any other reports he will let me know.  Patients APPOINTMENT is 08/30/15 at 10:00with Dr. Zenda Alpers. Patient is aware of the appt. Kenneth Weeks

## 2015-08-30 ENCOUNTER — Inpatient Hospital Stay (HOSPITAL_BASED_OUTPATIENT_CLINIC_OR_DEPARTMENT_OTHER): Payer: Medicare Other | Admitting: Hematology & Oncology

## 2015-08-30 ENCOUNTER — Ambulatory Visit (HOSPITAL_BASED_OUTPATIENT_CLINIC_OR_DEPARTMENT_OTHER)
Admission: RE | Admit: 2015-08-30 | Discharge: 2015-08-30 | Disposition: A | Discharge status: Home / Self Care | Payer: Medicare Other | Source: Ambulatory Visit | Attending: Hematology & Oncology | Admitting: Hematology & Oncology

## 2015-08-30 ENCOUNTER — Encounter (HOSPITAL_BASED_OUTPATIENT_CLINIC_OR_DEPARTMENT_OTHER): Payer: Self-pay | Admitting: Hematology & Oncology

## 2015-08-30 VITALS — BP 146/91 | HR 110 | Temp 98.1°F | Resp 16 | Ht 75.39 in | Wt 287.2 lb

## 2015-08-30 DIAGNOSIS — R918 Other nonspecific abnormal finding of lung field: Secondary | ICD-10-CM | POA: Insufficient documentation

## 2015-08-30 DIAGNOSIS — Z79899 Other long term (current) drug therapy: Secondary | ICD-10-CM | POA: Insufficient documentation

## 2015-08-30 DIAGNOSIS — Z7901 Long term (current) use of anticoagulants: Secondary | ICD-10-CM | POA: Insufficient documentation

## 2015-08-30 DIAGNOSIS — I10 Essential (primary) hypertension: Secondary | ICD-10-CM | POA: Insufficient documentation

## 2015-08-30 DIAGNOSIS — Z9049 Acquired absence of other specified parts of digestive tract: Secondary | ICD-10-CM | POA: Insufficient documentation

## 2015-08-30 DIAGNOSIS — C189 Malignant neoplasm of colon, unspecified: Secondary | ICD-10-CM

## 2015-08-30 DIAGNOSIS — Q8740 Marfan's syndrome, unspecified: Secondary | ICD-10-CM | POA: Insufficient documentation

## 2015-08-30 DIAGNOSIS — K7460 Unspecified cirrhosis of liver: Secondary | ICD-10-CM | POA: Insufficient documentation

## 2015-08-30 DIAGNOSIS — I4891 Unspecified atrial fibrillation: Secondary | ICD-10-CM | POA: Insufficient documentation

## 2015-08-30 DIAGNOSIS — Z952 Presence of prosthetic heart valve: Secondary | ICD-10-CM | POA: Insufficient documentation

## 2015-08-30 DIAGNOSIS — Z888 Allergy status to other drugs, medicaments and biological substances status: Secondary | ICD-10-CM | POA: Insufficient documentation

## 2015-08-30 DIAGNOSIS — K922 Gastrointestinal hemorrhage, unspecified: Secondary | ICD-10-CM

## 2015-08-30 DIAGNOSIS — Z85038 Personal history of other malignant neoplasm of large intestine: Secondary | ICD-10-CM | POA: Insufficient documentation

## 2015-08-30 LAB — CBC W/AUTO DIFF
BASOPHIL #: 0.03 x10ˆ3/uL (ref 0.00–0.10)
BASOPHIL %: 1 % (ref 0–3)
EOSINOPHIL #: 0.06 x10ˆ3/uL (ref 0.00–0.50)
EOSINOPHIL %: 1 % (ref 0–5)
HCT: 48.4 % (ref 40.0–50.0)
HGB: 15.7 g/dL (ref 13.5–18.0)
LYMPHOCYTE #: 1.44 x10ˆ3/uL (ref 1.00–4.80)
LYMPHOCYTE %: 23 % (ref 15–43)
MCH: 29.6 pg (ref 27.5–33.2)
MCHC: 32.4 g/dL (ref 32.0–36.0)
MCV: 91.3 fL (ref 82.0–97.0)
MONOCYTE #: 0.31 x10ˆ3/uL (ref 0.20–0.90)
MONOCYTE %: 5 % (ref 5–12)
MONOCYTE %: 5 % (ref 5–12)
MPV: 8.6 fL (ref 7.4–10.5)
NEUTROPHIL #: 4.4 x10ˆ3/uL (ref 1.50–6.50)
NEUTROPHIL %: 71 % (ref 43–76)
PLATELETS: 173 x10ˆ3/uL (ref 150–450)
RBC: 5.3 10*6/uL (ref 4.40–5.80)
RBC: 5.3 x10ˆ6/uL (ref 4.40–5.80)
RDW: 15.6 % (ref 11.0–16.0)
WBC: 6.2 x10ˆ3/uL (ref 4.0–11.0)

## 2015-08-30 LAB — COMPREHENSIVE METABOLIC PROFILE - BMC/JMC ONLY
ALBUMIN/GLOBULIN RATIO: 1.3 (ref 0.8–2.0)
ALBUMIN: 4 g/dL (ref 3.5–5.0)
ALKALINE PHOSPHATASE: 75 U/L (ref 38–126)
ALT (SGPT): 18 U/L (ref 17–63)
ANION GAP: 7 mmol/L (ref 3–11)
AST (SGOT): 24 U/L (ref 15–41)
BILIRUBIN TOTAL: 1.5 mg/dL — ABNORMAL HIGH (ref 0.3–1.2)
BUN/CREA RATIO: 11 (ref 6–22)
BUN: 12 mg/dL (ref 6–20)
CALCIUM: 9.4 mg/dL (ref 8.8–10.2)
CHLORIDE: 102 mmol/L (ref 101–111)
CO2 TOTAL: 26 mmol/L (ref 22–32)
CREATININE: 1.14 mg/dL (ref 0.61–1.24)
ESTIMATED GFR: >60 mL/min/1.73m?2 (ref >60)
GLUCOSE: 149 mg/dL — ABNORMAL HIGH (ref 70–110)
POTASSIUM: 4.2 mmol/L (ref 3.4–5.1)
PROTEIN TOTAL: 7.2 g/dL (ref 6.4–8.3)
SODIUM: 135 mmol/L — ABNORMAL LOW (ref 136–145)

## 2015-08-30 LAB — FERRITIN: FERRITIN: 24 ng/mL (ref 24–336)

## 2015-08-30 LAB — CARCINOEMBRYONIC ANTIGEN: CEA: 2.6 ng/mL

## 2015-08-30 NOTE — Progress Notes (Signed)
CT CHEST W IV CONTRAST NO AUTH RQ.  lvm to call/schedule

## 2015-09-01 NOTE — H&P (Signed)
SEE DICTATED NOTE

## 2015-09-02 ENCOUNTER — Telehealth (HOSPITAL_BASED_OUTPATIENT_CLINIC_OR_DEPARTMENT_OTHER): Payer: Self-pay | Admitting: Hematology & Oncology

## 2015-09-02 NOTE — Consults (Signed)
PATIENT NAME: Kenneth Weeks, Kenneth Weeks  HOSPITAL NUMBER:  [id number]  DATE OF SERVICE: 08/30/2015  DATE OF BIRTH:  1950-11-21    CONSULTATION    The patient is self-referred.    REASON FOR REFERRAL:    History of colon cancer.    HISTORY OF PRESENT ILLNESS:    Kenneth Weeks is a 65 year old male who has Marfan's syndrome.  He has cardiac valve and requires lifelong anticoagulation.  A year ago, while on anticoagulation but therapeutic level, he had a gastrointestinal bleed.  The patient was transferred to [facility] where he underwent a colonoscopy.  He was found to have a polyp.  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  In June of 2016, he underwent a right hemicolectomy for a  moderately differentiated adenocarcinoma.  The patient was staged as a T2 N0.  Thirty lymph nodes were negative for malignancy.  The patient is in need of a followup colonoscopy and additional care.  He wishes to transfer his care to [facility], because of the distance involved in traveling to [place]. He has no hematochezia, melena or abdominal pain.  His warfarin as managed by his primary care provider.    PAST MEDICAL HISTORY:  Marfan syndrome.  Cardiac valve replacement.  Atrial fibrillation, hypertension, cirrhosis, pulmonary nodules.    CURRENT MEDICATIONS:  Lasix, magnesium, potassium, Viagra, Aldactone, Coumadin.    ALLERGIES:  ALLEGRA.    FAMILY HISTORY:  Mother died of an aneurysm. Father died from age-related causes.  Two brothers, one deceased from suicide, one deceased from cirrhosis.  One brother alive and well.  Five sisters alive and well.  Four children alive and well.    REVIEW OF SYSTEMS:  General:  No fevers, night sweats or chills.  Skin:  No rashes. HEENT:  No difficulty swallowing.  Lungs:  No dyspnea.  CVS: No history of angina.  Abdomen: See above. Genitourinary: No frequency or dysuria. Neurologic: No paresthesia, diplopia or headaches.    PHYSICAL  EXAMINATION:    VITAL SIGNS: Blood pressure is 146/91, pulse is 110 and irregular.     SKIN: Pale.   HEENT:  No palpable adenopathy.   LUNGS:  Clear.   CARDIOVASCULAR:  Mechanical valve is heard. 1/6 systolic ejection murmur.  The rate is irregular. The apical pulse is 110.   ABDOMEN:  Soft.  Ventral hernia is present.  No masses.  Liver edge is palpable, but not enlarged by percussion.  The spleen is not palpable.    EXTREMITIES:  Chronic stasis changes.   MUSCULOSKELETAL: No bony tenderness.    NEUROLOGIC: Is intact for motor, sensory and cerebellar exams.    IMPRESSION:    The patient has a stage II colorectal carcinoma and is now approximately one year beyond his diagnosis.  He is up to date on his CT scans.  He will need a colonoscopy later this year.  He is being referred to surgery  locally.  He is due for restaging CT scan, which will be ordered. Follow up here after the CT scan is performed.        Britt Bottom, MD              CC:   Viona Gilmore, MD   Fax: [phone]     Harlow Mares, MD   Fax: [phone]     Gwinda Passe, MD       DD:  08/30/2015 19:36:36  DT:  08/31/2015 14:03:25 LA  D#:  [id number]

## 2015-09-02 NOTE — Telephone Encounter (Signed)
Per Dr. Zenda Alpers this patient is referred to  Dr. Cammie Mcgee .Marland Kitchenappt is 10/07/15 @ 3:30pm and also a referral was sent to Dr. Lovett Calender per their protocol and they will schedule the patient. I phoned the patient and gave him this information. Vania Rea

## 2015-09-05 ENCOUNTER — Ambulatory Visit (HOSPITAL_BASED_OUTPATIENT_CLINIC_OR_DEPARTMENT_OTHER)
Admission: RE | Admit: 2015-09-05 | Discharge: 2015-09-05 | Disposition: A | Discharge status: Home / Self Care | Payer: Medicare Other | Source: Ambulatory Visit | Attending: Hematology & Oncology | Admitting: Hematology & Oncology

## 2015-09-05 DIAGNOSIS — R918 Other nonspecific abnormal finding of lung field: Secondary | ICD-10-CM | POA: Insufficient documentation

## 2015-09-05 DIAGNOSIS — C189 Malignant neoplasm of colon, unspecified: Secondary | ICD-10-CM | POA: Insufficient documentation

## 2015-09-05 DIAGNOSIS — K922 Gastrointestinal hemorrhage, unspecified: Secondary | ICD-10-CM

## 2015-09-05 MED ORDER — IOPAMIDOL 370 MG IODINE/ML (76 %) INTRAVENOUS SOLUTION
100.00 mL | INTRAVENOUS | Status: AC
Start: 2015-09-05 — End: 2015-09-05
  Administered 2015-09-05: 90 mL via INTRAVENOUS
  Filled 2015-09-05: qty 100

## 2015-09-05 MED ORDER — IOPAMIDOL 370 MG IODINE/ML (76 %) INTRAVENOUS SOLUTION
100.00 mL | INTRAVENOUS | Status: DC
Start: 2015-09-05 — End: 2017-04-22

## 2015-09-09 ENCOUNTER — Inpatient Hospital Stay (HOSPITAL_BASED_OUTPATIENT_CLINIC_OR_DEPARTMENT_OTHER): Payer: Medicare Other | Admitting: Hematology & Oncology

## 2015-09-09 ENCOUNTER — Encounter (HOSPITAL_BASED_OUTPATIENT_CLINIC_OR_DEPARTMENT_OTHER): Payer: Self-pay | Admitting: Hematology & Oncology

## 2015-09-09 VITALS — BP 144/93 | HR 104 | Temp 97.7°F | Resp 14 | Ht 75.39 in | Wt 287.0 lb

## 2015-09-09 DIAGNOSIS — C189 Malignant neoplasm of colon, unspecified: Secondary | ICD-10-CM

## 2015-09-09 DIAGNOSIS — Z9049 Acquired absence of other specified parts of digestive tract: Secondary | ICD-10-CM

## 2015-09-09 DIAGNOSIS — R918 Other nonspecific abnormal finding of lung field: Secondary | ICD-10-CM

## 2015-09-10 NOTE — Progress Notes (Signed)
PATIENT NAME: Kenneth Weeks, Kenneth Weeks  HOSPITAL NUMBER:  [id number]  DATE OF SERVICE: 09/09/2015  DATE OF BIRTH:  Jun 30, 1950    PROGRESS NOTE    REASON FOR THE VISIT:    Colorectal carcinoma.    SUBJECTIVE:    The patient comes in today for continued followup of his stage II colorectal carcinoma, T2 N0.  The patient had undergone a right hemicolectomy in June 2016.  He is now at his anniversary.  A repeat CT scan was performed which shows pulmonary nodules which are unchanged in comparison to a CT scan performed 6 months ago.  He has no dyspnea or chest pain.    OBJECTIVE:    Blood pressure 144/93, pulse 104.      Skin:  Normal color and turgor.    HEENT:  Normal oral mucosa.    Lungs: Clear.    Cardiovascular:  Mechanical heart valve heard,  1/6 systolic ejection murmur.  Apical pulse is around 100.  It is irregular.    Abdomen:  Soft, nontender.  Ventral hernia is present.  No masses.  Liver edge is palpable, but not enlarged.  The spleen is not enlarged.    Extremities:  Chronic stasis changes.    Musculoskeletal:  No bony tenderness.    LABORATORY DATA:    White count 6.2, hemoglobin 15, platelet count 173,000, CEA 2.6.    CBC  Diff   Lab Results   Component Value Date/Time    WBC 6.2 08/30/2015 10:49 AM    WBC 7.2 07/26/2014 02:14 PM    HGB 15.7 08/30/2015 10:49 AM    HGB 9.8 (L) 07/26/2014 02:14 PM    HCT 48.4 08/30/2015 10:49 AM    HCT 31.2 (L) 07/26/2014 02:14 PM    PLTCNT 173 08/30/2015 10:49 AM    PLTCNT 434 07/26/2014 02:14 PM    PLTCNT 434 07/26/2014 02:14 PM    RBC 5.30 08/30/2015 10:49 AM    RBC 3.87 (L) 07/26/2014 02:14 PM    MCV 91.3 08/30/2015 10:49 AM    MCV 80.7 07/26/2014 02:14 PM    MCHC 32.4 08/30/2015 10:49 AM    MCHC 31.5 (L) 07/26/2014 02:14 PM    MCH 29.6 08/30/2015 10:49 AM    MCH 25.4 (L) 07/26/2014 02:14 PM    RDW 15.6 08/30/2015 10:49 AM    RDW 19.4 (H) 07/26/2014 02:14 PM    MPV 8.6 08/30/2015 10:49 AM    MPV 7.7 07/26/2014 02:14 PM    Lab Results   Component Value Date/Time    PMNS 71  08/30/2015 10:49 AM    PMNS 75 07/26/2014 02:14 PM    LYMPHOCYTES 23 08/30/2015 10:49 AM    LYMPHOCYTES 14 07/26/2014 02:14 PM    EOSINOPHIL 1 08/30/2015 10:49 AM    EOSINOPHIL 2 07/26/2014 02:14 PM    MONOCYTES 5 08/30/2015 10:49 AM    MONOCYTES 8 07/26/2014 02:14 PM    BASOPHILS 1 08/30/2015 10:49 AM    BASOPHILS 0.03 08/30/2015 10:49 AM    BASOPHILS 1 07/26/2014 02:14 PM    PMNABS 4.40 08/30/2015 10:49 AM    PMNABS 5.321 07/26/2014 02:14 PM    PMNABS 5.321 07/26/2014 02:14 PM    LYMPHSABS 1.44 08/30/2015 10:49 AM    LYMPHSABS 1.031 07/26/2014 02:14 PM    EOSABS 0.06 08/30/2015 10:49 AM    EOSABS 0.173 07/26/2014 02:14 PM    MONOSABS 0.31 08/30/2015 10:49 AM    MONOSABS 0.563 07/26/2014 02:14 PM    BASOSABS 0.097 07/26/2014  02:14 PM            ASSESSMENT:    History of colorectal carcinoma.    PLAN:  1. The patient advised to call me if he incurs any pulmonary symptoms.  2. Followup in 3 months.        Britt Bottom, MD              CC:   Viona Gilmore, MD   Fax: [phone]       DD:  09/09/2015 19:47:40  DT:  09/10/2015 11:27:53 ML  D#:  [id number]

## 2015-09-18 ENCOUNTER — Ambulatory Visit: Payer: Medicare Other | Attending: INTERNAL MEDICINE

## 2015-09-18 ENCOUNTER — Ambulatory Visit (INDEPENDENT_AMBULATORY_CARE_PROVIDER_SITE_OTHER): Payer: Self-pay | Admitting: INTERNAL MEDICINE

## 2015-09-18 DIAGNOSIS — Z7901 Long term (current) use of anticoagulants: Secondary | ICD-10-CM | POA: Insufficient documentation

## 2015-09-18 LAB — PT/INR
INR: 3
PROTHROMBIN TIME: 34.1 s — ABNORMAL HIGH (ref 9.4–12.5)

## 2015-09-18 NOTE — Progress Notes (Signed)
09/18/15 1200   [facility]/INR   Date INR drawn 09/18/15   INR 3.00   New warfarin dosage INR is at goal   New weekly dosgae Keep the same   Comments Check INR in one month   Initials RTM   Anticoag Indication Mechanical valve   Goal INR 2.5-3.5

## 2015-09-18 NOTE — Progress Notes (Signed)
Pt notified of results and verbalized understanding. Pt is currently taking 7.5mg  daily.  Appt scheduled 9/7   Gretchen Short, CMA  09/18/2015, 13:23

## 2015-10-07 ENCOUNTER — Ambulatory Visit (INDEPENDENT_AMBULATORY_CARE_PROVIDER_SITE_OTHER): Payer: Medicare Other

## 2015-10-07 ENCOUNTER — Encounter (INDEPENDENT_AMBULATORY_CARE_PROVIDER_SITE_OTHER): Payer: Self-pay

## 2015-10-07 VITALS — BP 163/97 | HR 54 | Temp 97.7°F | Ht 75.0 in | Wt 289.0 lb

## 2015-10-07 DIAGNOSIS — Z7901 Long term (current) use of anticoagulants: Secondary | ICD-10-CM | POA: Insufficient documentation

## 2015-10-07 DIAGNOSIS — Z952 Presence of prosthetic heart valve: Secondary | ICD-10-CM

## 2015-10-07 DIAGNOSIS — Z01818 Encounter for other preprocedural examination: Secondary | ICD-10-CM

## 2015-10-07 DIAGNOSIS — K432 Incisional hernia without obstruction or gangrene: Secondary | ICD-10-CM

## 2015-10-07 DIAGNOSIS — Q8740 Marfan's syndrome, unspecified: Secondary | ICD-10-CM

## 2015-10-07 DIAGNOSIS — K4090 Unilateral inguinal hernia, without obstruction or gangrene, not specified as recurrent: Secondary | ICD-10-CM

## 2015-10-07 DIAGNOSIS — Z85038 Personal history of other malignant neoplasm of large intestine: Secondary | ICD-10-CM

## 2015-10-07 NOTE — Progress Notes (Signed)
[address], [place]  [place] [address]  Dept: [phone]  Dept Fax: [phone]    Name: Kenneth Weeks  CSN: [id number]  Date of Birth: 1950/09/17  Age: 65 y.o.  Gender: male    Date of Service: 10/07/2015    HPI:   Kenneth Weeks is a 65 y.o. male referred by Dr. Flossie Dibble for several problems.  Apparently patient developed a GI bleed.  He is transferred to [facility]  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  He underwent a right hemicolectomy laparoscopically.  This was over a year ago.  Patient has developed a ventral incisional hernia from the laparoscopic approach.  It is causing some mild discomfort.  Patient had a CT imaging done.  This failed to reveal any recurrent disease.  Did note that he had a left inguinal hernia containing sigmoid colon.  Complicating above the patient has a history of Marfan syndrome.  He has had is aortic valve replacement mechanical valve but 18 years ago.  He requires chronic anticoagulation.  He also history of atrial fibrillation.  Patient denies any change in bowel bladder habits.  Denies hematochezia.  Denies weight loss or change in appetite.      Past Medical History  Past Medical History:   Diagnosis Date    A-fib [facility])     Chronic anti-coagulant therapy    Acute on chronic renal failure (HCC)     Cancer (HCC)     Cirrhosis of liver (HCC)     Congestive heart failure (HCC)     Diabetes mellitus ([place])     type ii diet controlled    Hemorrhage of gastrointestinal tract 5/16    Hemorrhagic shock ([place])     Hydrocele     Hypertension 07/10/2014    Marfan syndrome     Muscle weakness     Generalized    Reflux     Syncope     Thyroid disease     hypothyroid    Transfusion history     s/p Multiple units PRC/FFP this adm.    Valvular disease     Wears glasses          Past Surgical History:   Procedure Laterality Date    COLONOSCOPY      HX COLONOSCOPY      HX HEART CATHETERIZATION      HX HEART VALVE SURGERY  1999    [facility]     HX  HEMICOLECTOMY Right 07/2014    Laparoscopic right hemicolectomy for colon cancer    HX TONSIL AND ADENOIDECTOMY      HX TONSILLECTOMY      HX UPPER ENDOSCOPY  07/09/14    HX WISDOM TEETH EXTRACTION           Family History   Problem Relation Age of Onset    Hypertension Mother     Lung Cancer Father     Cirrhosis Brother          Social History     Social History    Marital status: Married     Spouse name: N/A    Number of children: N/A    Years of education: N/A     Social History Main Topics    Smoking status: Never Smoker    Smokeless tobacco: Never Used    Alcohol use No    Drug use: No    Sexual activity: Not on file     Other Topics Concern  Routine Exercise Yes    Ability To Walk 2 Flight Of Steps Without Sob/Cp Yes    Unable To Ambulate No    Total Care No    Ability To Do Own Adl's Yes    Uses Walker No    Uses Cane No     Social History Narrative       Medications:   Current Outpatient Prescriptions   Medication Sig    docusate sodium (COLACE) 100 mg Oral Capsule Take 1 Cap (100 mg total) by mouth Twice daily    furosemide (LASIX) 40 mg Oral Tablet Take 1 Tab (40 mg total) by mouth Twice daily    levothyroxine (SYNTHROID) 200 mcg Oral Tablet TAKE 1 TABLET BY MOUTH ONCE DAILY    MAGNESIUM CARBONATE ORAL Take by mouth Once a day    POTASSIUM GLUCONATE ORAL Take by mouth Once a day    Sildenafil (VIAGRA) 100 mg Oral Tablet Take 1 Tab (100 mg total) by mouth Every 24 hours as needed    spironolactone (ALDACTONE) 50 mg Oral Tablet TAKE ONE TABLET BY MOUTH ONE TIME DAILY    warfarin (COUMADIN) 5 mg Oral Tablet Take 1 Tab (5 mg total) by mouth Once a day (Patient taking differently: Take 7.5 mg by mouth Once a day )     Allergies:  Allergies   Allergen Reactions    Allegra [Fexofenadine]  Other Adverse Reaction (Add comment)     "retain fluid"       Review of System:  Constitutional: negative for fevers, chills, weight loss, fatigue , sweating, or weakness.  Neurologic:  Negative  for seizures.  Negative for migraines.  Ears, nose, mouth, throat, and face: negative for  headaches, nosebleeds, wheezing  Eyes: negative for visual disturbance, blurred vision, or double vision  Cardiovascular: negative for chest pain at rest, chest with exertion, palpitations, difficulty breathing while lying down, difficulty breathing while sleeping, or leg swelling, cardiac history as above  Respiratory: negative for chronic cough, coughing up blood, sputum production, shortness of breath or wheezing.  Gastrointestinal: negative for heartburn, dysphagia, nausea, vomiting, abdominal pain, diarrhea, constipation, melena or hematochezia, dark stool, or light stool., GI history as above  Genitourinary: negative for painful urination, urgent urination, frequent urination, blood in urine, or dark urine.  Musculoskeletal: negative for muscle pain, neck pain, back pain, or joint pain.  Endocrinology; Hematology, Allergy: easy bruising, bleeding, or environmental allergies.  Breast: negative for skin changes, breast lump, lump in armpit, retraction of nipple, discharge from nipple, or bloody discharge from nipple.  Psychiatric:  Negative change of mental status  Other:  Except for HPI/PMH      Exam:   height is 1.905 m (6\' 3" ) and weight is 131.1 kg (289 lb). His oral temperature is 36.5 C (97.7 F). His blood pressure is 163/97 (abnormal) and his pulse is 54. body mass index is 36.12 kg/(m^2).  Physical examination reveals a tall moderately obese male in no acute distress.  Examination head neck unremarkable.    Lungs are clear auscultation percussion.  Heart regular rate and rhythm with metallic click noted.  Abdomen obese nontender.  There is a bulge superior to the incision.  General rectal:  Testes down bilaterally.  There is a mass in the left groin with increased Valsalva.  Extremities range of motion    Impression:  Need for colonoscopy status post right hemicolectomy for stage II colon cancee  Ventral  incisional hernia just above the umbilicus  Left inguinal hernia  Plan:  Patient undergo colonoscopy.  A couple days will follow that with repair both the ventral and inguinal hernia. Patient require bridge therapy.  He is scheduled to see Dr. Shane Crutch next week.  I will allow Dr. Anabel Bene he would commonly to do the dosing for the Lovenox to bridge him.  He is scheduled for 9/19 for the colonoscopy.  Undergo ventral incisional and left inguinal hernia repair on 09/21    Alyson Locket Dillon Mcreynolds, MD    @CCRECIPIENT @    Dr. Shane Crutch

## 2015-10-17 ENCOUNTER — Ambulatory Visit: Payer: Medicare Other | Attending: INTERNAL MEDICINE

## 2015-10-17 ENCOUNTER — Other Ambulatory Visit (INDEPENDENT_AMBULATORY_CARE_PROVIDER_SITE_OTHER): Payer: Self-pay | Admitting: INTERNAL MEDICINE

## 2015-10-17 DIAGNOSIS — Z7901 Long term (current) use of anticoagulants: Secondary | ICD-10-CM

## 2015-10-17 DIAGNOSIS — Z5181 Encounter for therapeutic drug level monitoring: Secondary | ICD-10-CM | POA: Insufficient documentation

## 2015-10-17 LAB — PT/INR
INR: 3.35
PROTHROMBIN TIME: 38.3 s — ABNORMAL HIGH (ref 9.4–12.5)

## 2015-10-18 ENCOUNTER — Encounter (INDEPENDENT_AMBULATORY_CARE_PROVIDER_SITE_OTHER): Payer: Self-pay | Admitting: INTERNAL MEDICINE

## 2015-10-18 ENCOUNTER — Ambulatory Visit (INDEPENDENT_AMBULATORY_CARE_PROVIDER_SITE_OTHER): Payer: Medicare Other | Admitting: INTERNAL MEDICINE

## 2015-10-18 VITALS — BP 132/98 | HR 96 | Temp 96.7°F | Ht 75.5 in | Wt 290.6 lb

## 2015-10-18 DIAGNOSIS — Z7901 Long term (current) use of anticoagulants: Principal | ICD-10-CM

## 2015-10-18 DIAGNOSIS — K439 Ventral hernia without obstruction or gangrene: Secondary | ICD-10-CM

## 2015-10-18 MED ORDER — ENOXAPARIN 150 MG/ML SUBCUTANEOUS SYRINGE
130.00 mg | INJECTION | Freq: Two times a day (BID) | SUBCUTANEOUS | 0 refills | Status: DC
Start: 2015-10-18 — End: 2016-01-20

## 2015-10-18 NOTE — Progress Notes (Signed)
[facility]   [address]   [place], [place] [address]  [phone]  [phone]    Chronic Problems Office Visit     SUBJECTIVE   Kenneth Weeks is a 65 y.o. male here for follow-up on anticoagulation; colon cancer surgery; and other primary care issues.     This is a 60-monthoffice visit for him. He has been well.     Since our last visit together, has experienced the following:   ER -- no visits   Urgent care visits -- no visits   Accidents with a car -- nothing   Injuries at home which resulted in injury -- nothing   Medications from other providers -- none new from other providers   Surgeries or other procedures --colonoscopy was the last procedure; nothing since then     Vision -- no changes     Hearing -- no acute changes     Sources of pain which are new -- left heel pain     Chewing/swallowing -- no choking     Bowels -- no constipation    Medications reviewed by me personally:   Warfarin 7.534mdaily in the AM   Synthroid 2007mdaily in the AM   Lasix 40 mg BID   Spironolactone 50 mg daily in the AM  Potassium daily -- OTC supplement   Warfarin 10 mg daily   Magnesium tablet   Vitamin D3     Physical activity  30 minutes on the treadmill. 3 days/week      Changes in family history since we last met --  [place] living [place]     Changes in social history since we last met -- living in the same location; wife is healthy     Medications reviewed    Current Outpatient Prescriptions:     docusate sodium (COLACE) 100 mg Oral Capsule, Take 1 Cap (100 mg total) by mouth Twice daily, Disp: , Rfl: 0    furosemide (LASIX) 40 mg Oral Tablet, Take 1 Tab (40 mg total) by mouth Twice daily, Disp: 180 Tab, Rfl: 3    levothyroxine (SYNTHROID) 200 mcg Oral Tablet, TAKE 1 TABLET BY MOUTH ONCE DAILY, Disp: 90 Tab, Rfl: 3    MAGNESIUM CARBONATE ORAL, Take by mouth Once a day, Disp: , Rfl:     POTASSIUM GLUCONATE ORAL, Take by mouth Once a day, Disp: , Rfl:     Sildenafil  (VIAGRA) 100 mg Oral Tablet, Take 1 Tab (100 mg total) by mouth Every 24 hours as needed, Disp: 5 Tab, Rfl: 3    spironolactone (ALDACTONE) 50 mg Oral Tablet, TAKE ONE TABLET BY MOUTH ONE TIME DAILY, Disp: 90 Tab, Rfl: 3    warfarin (COUMADIN) 5 mg Oral Tablet, Take 1 Tab (5 mg total) by mouth Once a day (Patient taking differently: Take 7.5 mg by mouth Once a day ), Disp: 180 Tab, Rfl: 3  No current facility-administered medications for this visit.     Facility-Administered Medications Ordered in Other Visits:     iopamidol (ISOVUE-370) 76% infusion, 100 mL, Intravenous, Give in Radiology, JunLars MassonD      Review of Systems -     Constitutional: negative for fevers  Eyes: no new blurry  ENT: no acute hearing loss   Cardiovascular: no new chest pain  Respiratory: no new shortness of breath   GI: no constipation   GU: no difficulty urinating   Musculoskeletal:  heel pain   Neurological: no new seizure or headaches   Emotional/Pscychiatric: no depression or anxiety  Skin: stasis changes in BL legs   Endocrine: no new diabetes   Hematologic/Lymphatic: no DVT or bleeding issues      History   Smoking Status    Never Smoker   Smokeless Tobacco    Never Used       OBJECTIVE:  BP (!) 132/98   Pulse 96   Temp 35.9 C (96.7 F) (Tympanic)    Ht 1.918 m (6' 3.5")   Wt 131.8 kg (290 lb 9.6 oz)   BMI 35.84 kg/m2    Counseling done about the following face-to-face:     Labs reviewed by me   Drug-drug interactions considered and reviewed by me     Physical Exam     General -- alert   HEENT -- PERRL, EOMI   Heart --mechanical click   Neck -- Thyroid is normal sized  Lungs -- clear and no wheezing; no pleurisy   Abdomen -- soft and tender  Neuro -- normal gait and tone; strength is 5/5  Skin -- no rashes noted   Psych -- pleasant and smiling   Vascular -- legs are not swollen; smooth, shiny skin        Labs -- reviewed by me in the EMR   July 2017    Hgb 15.7   Creatinine 1.14   CT scan of chest -- minimal lung nodules,  not changed; not big     March 2017    INR 2.17 January 2015    Dr. Burke Keels note: no evidence of any metastatic disease    Dr. Dianah Field note: pT2N0 stage I colon cancer    Hgb 11.8    Creatinine 1.04    Glucose 99   LFT's okay   CEA 1.5   CT chest/abdomen/pelvis -- no evidence of recurrent disease; some small lung nodules     June 2016   [facility] Note:     Follow up colonoscopy in December 2016     CT scan of chest/abdomen/pelvis in summer 2017     May 2016   Creatinine 1.27   Glucose 136 (not fasting)    LFT's okay     March 2016    Creatinine 1.02   TSH 1.23   LFT's okay     Medications -- reviewed by me; drug-drug interactions considered by me      Counseling provided directly face-to-face about: more exercise on a regular basis       ASSESSMENT AND PLAN:  Ventral hernia repair and need for colonoscopy while on anticoagulation  -- I have reviewed Dr. Arvilla Meres note and the timing of the 09/19 and 09/21 procedures. He will need Lovenox bridging therapy. [facility]. He is to get this ASAP and then get into the office for Lovenox teaching. 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 -current lasix dosing of 71m twice per day     Warfarin management for mechanical heart valve -- INR at goal last check     Atrial fibrilation -- he does not have any limiting  tachycardia symptoms     Hypothyroidism -- on replacement     Screening for Diabetes -- update in 2016     High cholesterol screening -- check in the spring 2017     Obesity -- we talked about this today     Vision -- glasses; no changes     Hearing issues -- not limited     Exercise --  He is walking on a regular basis on the treadmill     Need for  HIV or hepatitis C screening -- no reason to screen     Tobacco abuse screening -- none at all     Alcohol and drug abuse screening -- none at all     Depression/anxiety screening -- he denies any at this time     Screening for colorectal cancer -- he is not due for screening     Immunizations -- I recommend a yearly flu shot       DISPOSITION:   I have written an Rx for Lovenox to get this approved in anticipation of his surgery -- [facility] [place]   Call next week once he has Lovenox to get in the office for teaching     He declines a flu shot today     Follow-up after surgery

## 2015-10-21 ENCOUNTER — Telehealth (INDEPENDENT_AMBULATORY_CARE_PROVIDER_SITE_OTHER): Payer: Self-pay | Admitting: INTERNAL MEDICINE

## 2015-10-21 NOTE — Telephone Encounter (Signed)
Patient called medication for surgery is over $1000,00 for blood thinner part of the surgery. You told him to call back if to expensive and you may be able to do something else.

## 2015-10-23 ENCOUNTER — Encounter (HOSPITAL_BASED_OUTPATIENT_CLINIC_OR_DEPARTMENT_OTHER): Payer: Self-pay | Admitting: Anesthesiology

## 2015-10-23 ENCOUNTER — Encounter (HOSPITAL_BASED_OUTPATIENT_CLINIC_OR_DEPARTMENT_OTHER): Payer: Self-pay

## 2015-10-23 ENCOUNTER — Ambulatory Visit (HOSPITAL_BASED_OUTPATIENT_CLINIC_OR_DEPARTMENT_OTHER)
Admission: RE | Admit: 2015-10-23 | Discharge: 2015-10-23 | Disposition: A | Discharge status: Home / Self Care | Payer: Medicare Other | Source: Ambulatory Visit

## 2015-10-23 VITALS — BP 136/89 | HR 92 | Ht 75.5 in | Wt 289.0 lb

## 2015-10-23 DIAGNOSIS — Z952 Presence of prosthetic heart valve: Secondary | ICD-10-CM

## 2015-10-23 DIAGNOSIS — R9431 Abnormal electrocardiogram [ECG] [EKG]: Secondary | ICD-10-CM | POA: Insufficient documentation

## 2015-10-23 DIAGNOSIS — K432 Incisional hernia without obstruction or gangrene: Secondary | ICD-10-CM | POA: Insufficient documentation

## 2015-10-23 DIAGNOSIS — I4891 Unspecified atrial fibrillation: Secondary | ICD-10-CM

## 2015-10-23 DIAGNOSIS — Z01818 Encounter for other preprocedural examination: Secondary | ICD-10-CM | POA: Insufficient documentation

## 2015-10-23 DIAGNOSIS — I38 Endocarditis, valve unspecified: Secondary | ICD-10-CM | POA: Insufficient documentation

## 2015-10-23 DIAGNOSIS — I482 Chronic atrial fibrillation, unspecified: Secondary | ICD-10-CM

## 2015-10-23 DIAGNOSIS — I509 Heart failure, unspecified: Secondary | ICD-10-CM | POA: Insufficient documentation

## 2015-10-23 DIAGNOSIS — Z22321 Carrier or suspected carrier of Methicillin susceptible Staphylococcus aureus: Secondary | ICD-10-CM | POA: Insufficient documentation

## 2015-10-23 DIAGNOSIS — Z85038 Personal history of other malignant neoplasm of large intestine: Secondary | ICD-10-CM | POA: Insufficient documentation

## 2015-10-23 DIAGNOSIS — I10 Essential (primary) hypertension: Secondary | ICD-10-CM | POA: Insufficient documentation

## 2015-10-23 DIAGNOSIS — K469 Unspecified abdominal hernia without obstruction or gangrene: Secondary | ICD-10-CM

## 2015-10-23 HISTORY — DX: Obesity, unspecified: E66.9

## 2015-10-23 HISTORY — DX: Disease of biliary tract, unspecified: K83.9

## 2015-10-23 HISTORY — DX: Unspecified abdominal hernia without obstruction or gangrene: K46.9

## 2015-10-23 LAB — CBC WITH DIFF
BASOPHIL #: 0 x10ˆ3/uL (ref 0.00–0.10)
BASOPHIL %: 1 % (ref 0–3)
EOSINOPHIL #: 0 x10ˆ3/uL (ref 0.00–0.50)
EOSINOPHIL %: 1 % (ref 0–5)
HCT: 48.6 % (ref 40.0–50.0)
HGB: 15.9 g/dL (ref 13.5–18.0)
HGB: 15.9 g/dL (ref 13.5–18.0)
LYMPHOCYTE #: 1.3 x10ˆ3/uL (ref 1.00–4.80)
LYMPHOCYTE %: 20 % (ref 15–43)
MCH: 30.2 pg (ref 27.5–33.2)
MCHC: 32.7 g/dL (ref 32.0–36.0)
MCV: 92.5 fL (ref 82.0–97.0)
MONOCYTE #: 0.5 x10ˆ3/uL (ref 0.20–0.90)
MONOCYTE %: 8 % (ref 5–12)
MPV: 10.1 fL (ref 7.4–10.5)
NEUTROPHIL #: 4.6 x10ˆ3/uL (ref 1.50–6.50)
NEUTROPHIL %: 71 % (ref 43–76)
PLATELETS: 161 x10ˆ3/uL (ref 150–450)
RBC: 5.25 x10ˆ6/uL (ref 4.40–5.80)
RDW: 14.7 % (ref 11.0–16.0)
WBC: 6.4 x10ˆ3/uL (ref 4.0–11.0)

## 2015-10-23 LAB — COMPREHENSIVE METABOLIC PROFILE - BMC/JMC ONLY
ALBUMIN/GLOBULIN RATIO: 1.2 (ref 0.8–2.0)
ALBUMIN: 4 g/dL (ref 3.5–5.0)
ALKALINE PHOSPHATASE: 65 U/L (ref 38–126)
ALKALINE PHOSPHATASE: 65 U/L (ref 38–126)
ALT (SGPT): 19 U/L (ref 17–63)
ANION GAP: 7 mmol/L (ref 3–11)
AST (SGOT): 23 U/L (ref 15–41)
BILIRUBIN TOTAL: 1.3 mg/dL — ABNORMAL HIGH (ref 0.3–1.2)
BUN/CREA RATIO: 11 (ref 6–22)
BUN: 13 mg/dL (ref 6–20)
CALCIUM: 9.3 mg/dL (ref 8.8–10.2)
CHLORIDE: 97 mmol/L — ABNORMAL LOW (ref 101–111)
CO2 TOTAL: 32 mmol/L (ref 22–32)
CREATININE: 1.21 mg/dL (ref 0.61–1.24)
ESTIMATED GFR: 60 mL/min/1.73mˆ2 — ABNORMAL LOW (ref >60)
GLUCOSE: 176 mg/dL — ABNORMAL HIGH (ref 70–110)
GLUCOSE: 176 mg/dL — ABNORMAL HIGH (ref 70–110)
POTASSIUM: 4.8 mmol/L (ref 3.4–5.1)
PROTEIN TOTAL: 7.4 g/dL (ref 6.4–8.3)
SODIUM: 136 mmol/L (ref 136–145)

## 2015-10-23 LAB — PT/INR
INR: 2.72
PROTHROMBIN TIME: 30.9 s — ABNORMAL HIGH (ref 9.4–12.5)

## 2015-10-23 LAB — MRSA COLONIZATION SCREEN, PCR: MRSA COLONIZATION SCREEN: NEGATIVE

## 2015-10-23 NOTE — Telephone Encounter (Signed)
HI, Brookie --    Can we confirm that this patient is indeed going to be charged $1000 to get Lovenox injections. Thanks.     Does he need some type of prior authorization for this? He is going to need likely 2 weeks of the Lovenox because he is going to have two separate surgeries and need to be off of warfarin for that time.     Please let me know what the situation is. Thanks.     Berthe Oley

## 2015-10-23 NOTE — Telephone Encounter (Signed)
So... The problem is pt does not have prescription coverage only medical coverage.  There is absolutely nothing we can do about the price.  Please advise? Pt can afford to pay out of pocket for the injections.  Pt will be going OOT tomorrrow around 6:00 pm.

## 2015-10-24 ENCOUNTER — Telehealth (INDEPENDENT_AMBULATORY_CARE_PROVIDER_SITE_OTHER): Payer: Self-pay | Admitting: INTERNAL MEDICINE

## 2015-10-24 NOTE — Telephone Encounter (Signed)
Thanks for looking in to this.     So I think that he is going to be stuck with having to be admitted to the hospital for the duration of all of this. Which is a complete drag.     Likely: several days before and then several days after the second surgery. I wish there were another way.

## 2015-10-24 NOTE — Telephone Encounter (Signed)
Pt notified and he said he will have to think about this.  Pt would like for you to call and go over this with him.  Thanks

## 2015-10-24 NOTE — Telephone Encounter (Signed)
He is leaving to go out of town  Around 4 he would like to talk to you before he goes regarding blood thinners and his surgery which is on Tuesday   [phone]

## 2015-10-28 ENCOUNTER — Telehealth (INDEPENDENT_AMBULATORY_CARE_PROVIDER_SITE_OTHER): Payer: Self-pay

## 2015-10-28 MED ORDER — SODIUM CHLORIDE 0.9 % INTRAVENOUS SOLUTION
3.0000 g | Freq: Once | INTRAVENOUS | Status: DC
Start: 2015-10-29 — End: 2015-10-28
  Filled 2015-10-28: qty 20

## 2015-10-28 NOTE — Telephone Encounter (Signed)
Patient called and canceled Colonoscopy and Hernia Repair procedures. He states that because he takes Coumadin Dr. Cammie Mcgee told him that he will have to stay at the hospital for four days after the procedure, and he is not willing to stay. Called Chastine at the OR and cancelled procedures.    Gennie Alma, CMA  10/28/2015, 10:28

## 2015-10-29 ENCOUNTER — Ambulatory Visit (HOSPITAL_BASED_OUTPATIENT_CLINIC_OR_DEPARTMENT_OTHER): Admission: RE | Admit: 2015-10-29 | Payer: Medicare Other | Source: Ambulatory Visit

## 2015-10-29 ENCOUNTER — Encounter (HOSPITAL_BASED_OUTPATIENT_CLINIC_OR_DEPARTMENT_OTHER): Admission: RE | Payer: Self-pay | Source: Ambulatory Visit

## 2015-10-29 SURGERY — COLONOSCOPY WITH BIOPSY
Anesthesia: IV General

## 2015-10-30 LAB — ECG 12-LEAD
Atrial Rate: 64 {beats}/min
Calculated R Axis: 16 degrees
Calculated T Axis: 139 degrees
QRS Duration: 92 ms
QT Interval: 358 ms
QTC Calculation: 440 ms
Ventricular rate: 91 {beats}/min

## 2015-10-31 ENCOUNTER — Encounter (HOSPITAL_BASED_OUTPATIENT_CLINIC_OR_DEPARTMENT_OTHER): Admission: RE | Payer: Self-pay | Source: Ambulatory Visit

## 2015-10-31 ENCOUNTER — Ambulatory Visit (HOSPITAL_BASED_OUTPATIENT_CLINIC_OR_DEPARTMENT_OTHER): Admission: RE | Admit: 2015-10-31 | Payer: Medicare Other | Source: Ambulatory Visit

## 2015-10-31 SURGERY — REPAIR HERNIA INCISIONAL
Anesthesia: General | Laterality: Left

## 2015-11-05 ENCOUNTER — Encounter (INDEPENDENT_AMBULATORY_CARE_PROVIDER_SITE_OTHER): Payer: Self-pay

## 2015-11-05 ENCOUNTER — Encounter (INDEPENDENT_AMBULATORY_CARE_PROVIDER_SITE_OTHER): Payer: Medicare Other

## 2015-11-20 ENCOUNTER — Ambulatory Visit: Payer: Medicare Other | Attending: INTERNAL MEDICINE

## 2015-11-20 DIAGNOSIS — Z7901 Long term (current) use of anticoagulants: Secondary | ICD-10-CM | POA: Insufficient documentation

## 2015-11-20 LAB — PT/INR
INR: 2.77
PROTHROMBIN TIME: 31.5 s — ABNORMAL HIGH (ref 9.4–12.5)

## 2015-11-21 ENCOUNTER — Ambulatory Visit (INDEPENDENT_AMBULATORY_CARE_PROVIDER_SITE_OTHER): Payer: Self-pay | Admitting: INTERNAL MEDICINE

## 2015-11-21 NOTE — Progress Notes (Unsigned)
Spoke to patient and made him an appt he is aware of labs .

## 2015-11-21 NOTE — Progress Notes (Unsigned)
Left VM for Pt to call the office.  Gretchen Short, CMA  11/21/2015, 09:44

## 2015-11-21 NOTE — Progress Notes (Signed)
11/21/15 0800   [facility] PT/INR   Date INR drawn 11/21/15   INR 2.7   New warfarin dosage INR is at goal   New weekly dosgae Keep the same   Comments Check INR in one month   Initials RTM   Anticoag Indication valve   Goal INR 2.5-3.5

## 2015-12-09 ENCOUNTER — Telehealth (INDEPENDENT_AMBULATORY_CARE_PROVIDER_SITE_OTHER): Payer: Self-pay | Admitting: INTERNAL MEDICINE

## 2015-12-09 NOTE — Telephone Encounter (Signed)
Patient called his taking Coumadin 1 1/2 tablets and not 1 tablet dailey, needs [facility]

## 2015-12-11 MED ORDER — WARFARIN 5 MG TABLET
7.5000 mg | ORAL_TABLET | Freq: Every day | ORAL | 4 refills | Status: DC
Start: 2015-12-11 — End: 2016-02-17

## 2015-12-11 NOTE — Telephone Encounter (Signed)
Done

## 2015-12-19 ENCOUNTER — Ambulatory Visit (INDEPENDENT_AMBULATORY_CARE_PROVIDER_SITE_OTHER): Payer: Self-pay | Admitting: INTERNAL MEDICINE

## 2015-12-19 ENCOUNTER — Ambulatory Visit: Payer: Medicare Other | Attending: INTERNAL MEDICINE

## 2015-12-19 DIAGNOSIS — Z7901 Long term (current) use of anticoagulants: Secondary | ICD-10-CM

## 2015-12-19 DIAGNOSIS — I38 Endocarditis, valve unspecified: Secondary | ICD-10-CM | POA: Insufficient documentation

## 2015-12-19 LAB — PT/INR
INR: 3.01
PROTHROMBIN TIME: 33.1 s — ABNORMAL HIGH (ref 9.4–12.5)

## 2015-12-19 NOTE — Progress Notes (Signed)
Pt notified of results and verbalized understanding. Pt  Has appt scheduled on 12/7   Gretchen Short, [place]  12/19/2015, 15:17

## 2015-12-19 NOTE — Progress Notes (Signed)
12/19/15 1100   [facility]/INR   Date INR drawn 12/19/15   INR 3.0   New warfarin dosage INR is at goal   New weekly dosgae keep the same   Comments Check INR in one month   Initials RTM   Anticoag Indication Valve   Goal INR 2.5-3.5

## 2016-01-04 ENCOUNTER — Other Ambulatory Visit: Payer: Self-pay

## 2016-01-09 ENCOUNTER — Encounter (HOSPITAL_BASED_OUTPATIENT_CLINIC_OR_DEPARTMENT_OTHER): Payer: Medicare Other | Admitting: Hematology & Oncology

## 2016-01-16 ENCOUNTER — Ambulatory Visit: Payer: Medicare Other | Attending: INTERNAL MEDICINE

## 2016-01-16 ENCOUNTER — Ambulatory Visit (INDEPENDENT_AMBULATORY_CARE_PROVIDER_SITE_OTHER): Payer: Self-pay | Admitting: INTERNAL MEDICINE

## 2016-01-16 ENCOUNTER — Telehealth (INDEPENDENT_AMBULATORY_CARE_PROVIDER_SITE_OTHER): Payer: Self-pay | Admitting: INTERNAL MEDICINE

## 2016-01-16 DIAGNOSIS — I482 Chronic atrial fibrillation: Secondary | ICD-10-CM | POA: Insufficient documentation

## 2016-01-16 DIAGNOSIS — I38 Endocarditis, valve unspecified: Secondary | ICD-10-CM | POA: Insufficient documentation

## 2016-01-16 DIAGNOSIS — Z7901 Long term (current) use of anticoagulants: Principal | ICD-10-CM | POA: Insufficient documentation

## 2016-01-16 LAB — PT/INR
INR: 2.98
PROTHROMBIN TIME: 32.8 s — ABNORMAL HIGH (ref 9.4–12.5)

## 2016-01-16 NOTE — Progress Notes (Signed)
01/16/16 1200   [facility]/INR   Date INR drawn 01/16/16   INR 3.0   New warfarin dosage INR is at goal   New weekly dosgae Keep the same    Comments Check INR in one month   Initials RTM   Anticoag Indication Valve   Goal INR 2.5-3.5

## 2016-01-16 NOTE — Progress Notes (Signed)
Pt notified and lab appt scheduled.  Pt is taking 1.5 tabs daily.

## 2016-01-16 NOTE — Telephone Encounter (Signed)
Having colonoscopy  in [facility] 12.19.2017, needs to know how many day prior to stop and when to restate his warfarin    [phone]

## 2016-01-20 ENCOUNTER — Encounter (INDEPENDENT_AMBULATORY_CARE_PROVIDER_SITE_OTHER): Payer: Medicare Other | Admitting: INTERNAL MEDICINE

## 2016-01-20 ENCOUNTER — Telehealth (INDEPENDENT_AMBULATORY_CARE_PROVIDER_SITE_OTHER): Payer: Self-pay | Admitting: INTERNAL MEDICINE

## 2016-01-20 MED ORDER — ENOXAPARIN 150 MG/ML SUBCUTANEOUS SYRINGE
130.0000 mg | INJECTION | Freq: Two times a day (BID) | SUBCUTANEOUS | 0 refills | Status: DC
Start: 2016-01-20 — End: 2016-01-24

## 2016-01-20 NOTE — Telephone Encounter (Signed)
I will see him in the office

## 2016-01-20 NOTE — Telephone Encounter (Signed)
I went ahead and prescribed the medication that Kenneth Weeks needs: Lovenox injections.     These injections have to be approved by the insurance company. Once he has them, we can talk about the schedule to stop his coumadin.     He needs to let us know ASAP if his insurance company won't fill this Rx.   High whole colonoscopy procedure hinges on this.     Please keep me updated.   Thanks,  [employer]

## 2016-01-20 NOTE — Telephone Encounter (Signed)
Patient forgot he had an appt today, could you please address the message he's scheduled for his colonoscopy next tuesday

## 2016-01-20 NOTE — Telephone Encounter (Signed)
I spoke with Mitzi Hansen and Rx is on hold.

## 2016-01-20 NOTE — Telephone Encounter (Signed)
Unfortunately this is the same situation as in Sept, pt does not have Rx coverage and he can't afford this medication.  Pt thought he would just be stopping his coumadin for a few days prior to colonoscopy.

## 2016-01-20 NOTE — Telephone Encounter (Signed)
Mitzi Hansen -  pharmacist / [facility]    Requesting a call back regarding  Lovenox RX just received     Please call   Gorman, Campo  01/20/2016, 11:50'

## 2016-01-22 NOTE — Telephone Encounter (Incomplete)
Unfortunately, this is not the case.     Stopping the warfarin

## 2016-01-24 ENCOUNTER — Ambulatory Visit
Admission: RE | Admit: 2016-01-24 | Discharge: 2016-01-24 | Disposition: A | Discharge status: Home / Self Care | Payer: Medicare Other | Source: Ambulatory Visit

## 2016-01-24 ENCOUNTER — Encounter (HOSPITAL_COMMUNITY): Payer: Self-pay

## 2016-01-28 ENCOUNTER — Ambulatory Visit (HOSPITAL_BASED_OUTPATIENT_CLINIC_OR_DEPARTMENT_OTHER): Payer: Medicare Other | Admitting: Certified Registered"

## 2016-01-28 ENCOUNTER — Other Ambulatory Visit (HOSPITAL_COMMUNITY): Payer: Self-pay | Admitting: Gastroenterology

## 2016-01-28 ENCOUNTER — Encounter (HOSPITAL_COMMUNITY): Payer: Self-pay

## 2016-01-28 ENCOUNTER — Ambulatory Visit (HOSPITAL_BASED_OUTPATIENT_CLINIC_OR_DEPARTMENT_OTHER): Payer: Medicare Other | Admitting: Gastroenterology

## 2016-01-28 ENCOUNTER — Encounter (HOSPITAL_COMMUNITY)
Admission: RE | Disposition: A | Discharge status: Home / Self Care | Payer: Self-pay | Source: Ambulatory Visit | Attending: Gastroenterology

## 2016-01-28 ENCOUNTER — Ambulatory Visit (HOSPITAL_COMMUNITY): Payer: Medicare Other | Admitting: Certified Registered"

## 2016-01-28 ENCOUNTER — Inpatient Hospital Stay
Admission: RE | Admit: 2016-01-28 | Discharge: 2016-01-28 | Disposition: A | Discharge status: Home / Self Care | Payer: Medicare Other | Source: Ambulatory Visit | Attending: Gastroenterology | Admitting: Gastroenterology

## 2016-01-28 DIAGNOSIS — E669 Obesity, unspecified: Secondary | ICD-10-CM | POA: Insufficient documentation

## 2016-01-28 DIAGNOSIS — K5730 Diverticulosis of large intestine without perforation or abscess without bleeding: Secondary | ICD-10-CM

## 2016-01-28 DIAGNOSIS — I130 Hypertensive heart and chronic kidney disease with heart failure and stage 1 through stage 4 chronic kidney disease, or unspecified chronic kidney disease: Secondary | ICD-10-CM | POA: Insufficient documentation

## 2016-01-28 DIAGNOSIS — E1122 Type 2 diabetes mellitus with diabetic chronic kidney disease: Secondary | ICD-10-CM | POA: Insufficient documentation

## 2016-01-28 DIAGNOSIS — Z7901 Long term (current) use of anticoagulants: Secondary | ICD-10-CM

## 2016-01-28 DIAGNOSIS — Z5181 Encounter for therapeutic drug level monitoring: Secondary | ICD-10-CM

## 2016-01-28 DIAGNOSIS — Z08 Encounter for follow-up examination after completed treatment for malignant neoplasm: Secondary | ICD-10-CM | POA: Insufficient documentation

## 2016-01-28 DIAGNOSIS — I4891 Unspecified atrial fibrillation: Secondary | ICD-10-CM | POA: Insufficient documentation

## 2016-01-28 DIAGNOSIS — D128 Benign neoplasm of rectum: Secondary | ICD-10-CM

## 2016-01-28 DIAGNOSIS — Z79899 Other long term (current) drug therapy: Secondary | ICD-10-CM | POA: Insufficient documentation

## 2016-01-28 DIAGNOSIS — R600 Localized edema: Secondary | ICD-10-CM

## 2016-01-28 DIAGNOSIS — K621 Rectal polyp: Secondary | ICD-10-CM | POA: Insufficient documentation

## 2016-01-28 DIAGNOSIS — N5089 Other specified disorders of the male genital organs: Secondary | ICD-10-CM

## 2016-01-28 DIAGNOSIS — Z85038 Personal history of other malignant neoplasm of large intestine: Secondary | ICD-10-CM

## 2016-01-28 DIAGNOSIS — N432 Other hydrocele: Secondary | ICD-10-CM

## 2016-01-28 DIAGNOSIS — K432 Incisional hernia without obstruction or gangrene: Secondary | ICD-10-CM

## 2016-01-28 DIAGNOSIS — I38 Endocarditis, valve unspecified: Secondary | ICD-10-CM

## 2016-01-28 DIAGNOSIS — I482 Chronic atrial fibrillation, unspecified: Secondary | ICD-10-CM

## 2016-01-28 DIAGNOSIS — I10 Essential (primary) hypertension: Secondary | ICD-10-CM

## 2016-01-28 DIAGNOSIS — C189 Malignant neoplasm of colon, unspecified: Secondary | ICD-10-CM

## 2016-01-28 DIAGNOSIS — I509 Heart failure, unspecified: Secondary | ICD-10-CM

## 2016-01-28 DIAGNOSIS — Q8740 Marfan's syndrome, unspecified: Secondary | ICD-10-CM

## 2016-01-28 DIAGNOSIS — K922 Gastrointestinal hemorrhage, unspecified: Secondary | ICD-10-CM

## 2016-01-28 DIAGNOSIS — K5790 Diverticulosis of intestine, part unspecified, without perforation or abscess without bleeding: Secondary | ICD-10-CM | POA: Insufficient documentation

## 2016-01-28 DIAGNOSIS — K7460 Unspecified cirrhosis of liver: Secondary | ICD-10-CM

## 2016-01-28 DIAGNOSIS — R918 Other nonspecific abnormal finding of lung field: Secondary | ICD-10-CM

## 2016-01-28 DIAGNOSIS — E039 Hypothyroidism, unspecified: Secondary | ICD-10-CM

## 2016-01-28 DIAGNOSIS — Z952 Presence of prosthetic heart valve: Secondary | ICD-10-CM

## 2016-01-28 DIAGNOSIS — K4090 Unilateral inguinal hernia, without obstruction or gangrene, not specified as recurrent: Secondary | ICD-10-CM

## 2016-01-28 DIAGNOSIS — N189 Chronic kidney disease, unspecified: Secondary | ICD-10-CM | POA: Insufficient documentation

## 2016-01-28 DIAGNOSIS — E876 Hypokalemia: Secondary | ICD-10-CM

## 2016-01-28 DIAGNOSIS — K469 Unspecified abdominal hernia without obstruction or gangrene: Secondary | ICD-10-CM

## 2016-01-28 HISTORY — DX: Cardiac arrhythmia, unspecified: I49.9

## 2016-01-28 LAB — POC BLOOD GLUCOSE (RESULTS): GLUCOSE, POC: 135 mg/dL — ABNORMAL HIGH (ref 70–105)

## 2016-01-28 LAB — PT/INR
INR: 1.58 — ABNORMAL HIGH (ref 0.80–1.20)
PROTHROMBIN TIME: 18.3 s — ABNORMAL HIGH (ref 9.3–13.9)

## 2016-01-28 SURGERY — COLONOSCOPY
Anesthesia: Monitor Anesthesia Care | Site: Anus | Wound class: Clean Contaminated Wounds-The respiratory, GI, Genital, or urinary

## 2016-01-28 MED ORDER — SODIUM CHLORIDE 0.9 % (FLUSH) INJECTION SYRINGE
2.00 mL | INJECTION | Freq: Three times a day (TID) | INTRAMUSCULAR | Status: DC
Start: 2016-01-28 — End: 2016-01-28

## 2016-01-28 MED ORDER — SODIUM CHLORIDE 0.9 % (FLUSH) INJECTION SYRINGE
2.0000 mL | INJECTION | Freq: Three times a day (TID) | INTRAMUSCULAR | Status: DC
Start: 2016-01-28 — End: 2016-01-28

## 2016-01-28 MED ORDER — LACTATED RINGERS INTRAVENOUS SOLUTION
INTRAVENOUS | Status: DC
Start: 2016-01-28 — End: 2016-01-28

## 2016-01-28 MED ORDER — PROPOFOL 10 MG/ML INTRAVENOUS EMULSION
INTRAVENOUS | Status: DC | PRN
Start: 2016-01-28 — End: 2016-01-28
  Administered 2016-01-28: 0 ug/kg/min via INTRAVENOUS
  Administered 2016-01-28: 250 ug/kg/min via INTRAVENOUS
  Administered 2016-01-28: 100 ug/kg/min via INTRAVENOUS
  Administered 2016-01-28: 500 ug/kg/min via INTRAVENOUS

## 2016-01-28 MED ORDER — SODIUM CHLORIDE 0.9 % (FLUSH) INJECTION SYRINGE
2.00 mL | INJECTION | INTRAMUSCULAR | Status: DC | PRN
Start: 2016-01-28 — End: 2016-01-28

## 2016-01-28 MED ORDER — SODIUM CHLORIDE 0.9 % (FLUSH) INJECTION SYRINGE
2.0000 mL | INJECTION | INTRAMUSCULAR | Status: DC | PRN
Start: 2016-01-28 — End: 2016-01-28

## 2016-01-28 MED ORDER — SODIUM CHLORIDE 0.9 % INTRAVENOUS SOLUTION
INTRAVENOUS | Status: DC
Start: 2016-01-28 — End: 2016-01-28

## 2016-01-28 MED ADMIN — lactated Ringers intravenous solution: INTRAVENOUS | @ 10:00:00

## 2016-01-28 SURGICAL SUPPLY — 68 items
CATH CRE 10-11-12MM 7.5FR 5.5CM 240CM 2.8MM 3.2MM LOW PROF GW BAL DIL ESOPH PYL BIL PEBAX STRL LF (BALLOON) IMPLANT
CATH ELHMST GLD PRB 10FR 300CM_BIPO RND DIST TIP STD CONN (DIAGNOSTIC)
CATH ELHMST GLD PROBE 10FR 300CM BIPOLAR RND DIST TIP STD CONN FIRM SHAFT HMGLD STRL DISP 3.7MM MN (DIAGNOSTIC) IMPLANT
CATH ELHMST GLD PROBE 7FR 300CM BIPOLAR RND DIST TIP STD CONN FIRM SHAFT HMGLD STRL DISP 2.8MM MN (DIAGNOSTIC) IMPLANT
CATH ELHMST GLD PROBE 7FR 300C_M BIPOLAR RND DIST TIP STD (DIAGNOSTIC)
CLIP HMST MR CONDITIONAL BRD CATH ROT CONTROL KNOB NO SHEATH RSL 360 235CM 2.8MM 11MM OPN (SURGICAL INSTRUMENTS) IMPLANT
CONV USE ITEM 343591 - SOLIDIFY FLUID 1500ML DSPNSR L_Q TX SOLIDIFY SFTP LTS+ DISP (STER) ×1 IMPLANT
DEVICE HEMOSTASIS CLIP 360_235CM RESOLUTION (INSTRUMENTS)
DILATOR ENDOS CRE 240CM 5.5CM 11-13.5-15MM 7.5FR ESOPH PYL BIL BAL LOW PROF GW PEBAX STRL LF  DISP (BALLOON) IMPLANT
DILATOR ENDOS CRE 240CM 5.5CM 15-16.5-18MM 7.5FR ESOPH PYL BIL BAL LOW PROF GW PEBAX STRL LF  DISP (BALLOON) IMPLANT
DILATOR ENDOS CRE 240CM 5.5CM_11-13.5-15MM 7.5FR ESOPH PYL (BALLOON)
DILATOR ENDOS CRE 240CM 5.5CM_15-16.5-18MM 7.5FR ESOPH PYL (BALLOON)
DILATOR HERCULES 10-11-12_M00558680 EA (BALLOON)
DISC USE ITEM 309153 - TRAP SPECI ARGYLE 40CC GRAD SC_REW ON CAP REM MALE CONN (Cautery Accessories)
DISCONTINUED NO SUB - JELLY LUB DYNALUBE BCTRST WATER SOL NGRS PKT STRL 5GM LF (WOUND CARE SUPPLY) ×2 IMPLANT
DISCONTINUED USE ITEM 309153 - TRAP SPECI ARGYLE 40CC GRAD SC_REW ON CAP REM MALE CONN (Cautery Accessories) IMPLANT
DISCONTINUED USE ITEM 339015 - CONTAINR STRL 10% NEUT BF FRMLN POLYPROP GRAD LEAK RST ORNG PREFL SCREW CAP FSHR HLTHCR PRTCL GRN (CHEM) ×1 IMPLANT
DISCONTINUED USE ITEM 82101 - TUBING OXYGEN 50/CS 001302 (TUBE/TUBING & SUCTION SUPPLIES) IMPLANT
ELECTRODE PATIENT RTN 9FT VLAB C30- LB RM PHSV ACRL FOAM CORD NONIRRITATE NONSENSITIZE ADH STRP (CAUTERY SUPPLIES) ×1 IMPLANT
ELECTRODE PATIENT RTN 9FT VLAB_REM C30- LB PLHSV ACRL FOAM (CAUTERY SUPPLIES) ×1
FORCEPS BIOPSY 240CM 2.4MM RJ_4 2.8MM LRG CPC DISP ORNG (GUIDING)
FORCEPS BIOPSY MICROMESH TTH STREAMLINE CATH 240CM 2.4MM RJ 4 SS LRG CPC STRL DISP ORNG 2.8MM WRK (GUIDING) IMPLANT
FORCEPS BIOPSY NEEDLE 160CM 1.8MM RJ 4 DISP YW 2MM WRK CHNL GSPED (SURGICAL INSTRUMENTS) IMPLANT
FORCEPS BIOPSY NEEDLE 160CM 1._8MM RJ 4 PED 2+ MM DISP (INSTRUMENTS)
FORCEPS BIOPSY NEEDLE 240CM 2.2MM RJ 4 2.8MM STD CPC STRL DISP ORNG (SURGICAL INSTRUMENTS) IMPLANT
FORCEPS BIOPSY NEEDLE 240CM 2._2MM RJ 4 2.8MM STD CPC STRL (INSTRUMENTS)
FORCEPS ENDOS 240CM 2.8MM RJ 4 JMB DISP (SURGICAL INSTRUMENTS) IMPLANT
FORCEPS ENDOS 240CM 2.8MM RJ 4_JMB DISP (INSTRUMENTS)
FORMALIN 10% BUFF 8ML_23032059 100EA/PK (CHEM) ×1
GOWN SURG XL AAMI L3 NONREINFO_RCE HKLP CLSR STRL LTX PNK SMS (DGOW)
GOWN SURG XL L3 NONREINFORCE HKLP CLSR STRL LTX PNK SMS 47IN (DGOW) IMPLANT
INK ENDOSCOPIC MARKER SPOT 5ML GIS44 STERILE 10EA/BX (MISCELLANEOUS PT CARE ITEMS) IMPLANT
JELLY LUB EZ BCTRST H2O SOL NG_RS PKT STRL 5GM LF (WOUND CARE/ENTEROSTOMAL SUPPLY) ×4
LIGATOR 2300MM 30MM PLLP PRELD_DEV INT HNDL DISP ENDOS (INSTRUMENTS ENDOMECHANICAL)
LIGATOR DEV STRL DISP ENDOS LF (ENDOSCOPIC SUPPLIES) IMPLANT
LINER SUCT RD CRD MEDIVAC TW LOCK LID SHTOF VALVE CAN FILTER 1500CC LF  DISP (Suction) ×1 IMPLANT
LINER SUCT RD CRD MEDIVAC TW L_OCK LID SHTOF VALVE CAN FLTR (Suction) ×1
LOOP AUTOCLAV HLDR DTCH 30MM S_URG NYL PLPCTM NONST DISP (INSTRUMENTS ENDOMECHANICAL)
LOOP HLDR DTCH AUTOCLAV 30MM SURG NYL PLPCTM NONST LF  DISP (ENDOSCOPIC SUPPLIES) IMPLANT
NEEDLE ENDOS 5MM 25GA 2.5MM SS TFLN 230CM INJ PRJ SHEATH LL SPRG LD HNDL CRLK STRL LF  DISP (NEEDLES & SYRINGE SUPPLIES) IMPLANT
NEEDLE ENDOS 5MM 25GA 2.5MM SS_TFLN 230CM INJ PRJ SHTH LL (NEEDLES & SYRINGE SUPPLIES)
NEEDLE SCLRTX 25GA 2.3MM OPTC TIP SHTH STRL LF DISP YW (NEEDLES & SYRINGE SUPPLIES) IMPLANT
NET SPEC RETR 160CM 3MM RTHNT MAXI SHEATH 8X4CM NONST LF  DISP (Dilators) IMPLANT
NET SPEC RETR 160MM 1.8MM RTHN_T MINI 4.5X2CM PED NONST LF (Dilators)
NET SPEC RETR 230CM 2.5MM RTHNT STD SHEATH 6X3CM NONST LF  DISP (Dilators) IMPLANT
NET SPEC RETR 230CM 2.5MM RTHN_T STD SHTH 6X3CM NONST LF (Dilators)
NET SPEC RETR 8X4CM 3MM RTHNT_MAXI 160CM NONST LF DISP (Dilators)
PROBE COAG 7.2FT 6.9FR FIAPC CRCMF PLUG PLAY FUNCTIONALITY FILTER (ENDOSCOPIC SUPPLIES) IMPLANT
PROBE ESURG 220CM 2.3MM FIAPC FLXB STR FIRE STRL DISP (CAUTERY SUPPLIES) IMPLANT
PROBE ESURG 220CM 2.3MM FIAPC_FLXB STR FIRE ARGON PLAS COAG (CAUTERY SUPPLIES)
PROBE ESURG 7.2FT 6.9FR FIAPC_CRCMF PLUG PLAY FUNCTIONALITY (INSTRUMENTS ENDOMECHANICAL)
RETRIEVER ENDOS 160CM 1.8MM RTHNT MINI SM CATH SHEATH 4.5X2CM NONST (Dilators) IMPLANT
SNARE MED OVAL 240CM 2.4MM SNS LOOP SHRTHRW FLXB ENDOS 2.8MM WRK CHNL PLPCTM 27MM STRL DISP (ENDOSCOPIC SUPPLIES) IMPLANT
SNARE MED OVAL 240CM 2.4MM SNS_LOOP SHRTHRW FLXB ENDOS 2.8MM (INSTRUMENTS ENDOMECHANICAL)
SNARE SM OVAL 240CM 2.4MM SNS LOOP SHRTHRW FLXB ENDOS PLPCTM 13MM STRL LF  DISP (DIAGNOSTIC) ×1 IMPLANT
SNARE SM OVL 240CM 2.4MM SNS L_OOP SHRTHRW FLXB ENDOS PLPCTM (DIAGNOSTIC) ×1
SOLIDIFY FLUID 1500ML DSPNSR L_Q TX SOLIDIFY SFTP LTS+ DISP (STER) ×2
SOLUTION IRRG H2O 500CC 2F7113 (SOLUTIONS) ×1
SYRINGE INFLAT ALN II GA STRL DISP 60ML (NEEDLES & SYRINGE SUPPLIES) IMPLANT
SYRINGE INFLAT ALN II GA STRL_DISP 60ML (NEEDLES & SYRINGE SUPPLIES)
SYRINGE LL 50ML LF  STRL GRAD N-PYRG DEHP-FR PVC FREE MED DISP CLR (NEEDLES & SYRINGE SUPPLIES) IMPLANT
SYRINGE LL 50ML LF STRL GRAD_N-PYRG DEHP-FR PVC FREE MED (NEEDLES & SYRINGE SUPPLIES)
TRAP SPEC RETR ETRAP MAGNIFY W IND MSR GUIDE PLYP LF DISP (GI LAB SUPPLIES) ×2 IMPLANT
TUBING OXYGEN 50/CS 001302 (TUBE/TUBING & SUCTION SUPPLIES)
TUBING SUCT CLR 20FT 9/32IN MEDIVAC NCDTV M/M CONN STRL LF (Suction) ×1 IMPLANT
TUBING SUCT CONN 20FT LONG_STRL N720A (Suction) ×1
USE ITEM 43607 SNARE MED OVAL 240CM 2.4MM SNS LOOP SHRTHRW FLXB ENDOS 2.8MM WRK CHNL PLPCTM 27MM STRL DISP (ENDOSCOPIC SUPPLIES) IMPLANT
WATER STRL 500ML PLASTIC PR BTL LF (SOLUTIONS) ×1 IMPLANT

## 2016-01-28 NOTE — Anesthesia Preprocedure Evaluation (Signed)
ANESTHESIA PRE-OP EVALUATION  Review of Systems     anesthesia history negative    patient summary reviewed          Pulmonary  negative pulmonary ROS   Cardiovascular    Hypertension, valvular problems/murmurs, MR, murmur, peripheral edema, dysrhythmias and CHF       GI/Hepatic/Renal       Endo/Other    hypothyroidism, hyperthyroidism, obesity and chronic transfusion therapy     Neuro/Psych/MS  negative neuro/psych ROS  Cancer  CA                              Physical Assessment      Patient summary reviewed   Airway       Mallampati: II    TM distance: >3 FB    Neck ROM: full  Mouth Opening: good.  No Facial hair  No Beard  No endotracheal tube present  No Tracheostomy present    Dental                    Pulmonary    Breath sounds clear to auscultation  (-) no rhonchi, no decreased breath sounds, no wheezes, no rales and no stridor     Cardiovascular        (+) peripheral edema present and murmur present        Other findings            Plan  Planned anesthesia type: MAC    ASA 3       Anesthetic plan and risks discussed with patient.     Anesthesia issues/risks discussed are: Stroke and Cardiac Events/MI.        Patient's NPO status is appropriate for Anesthesia.           Plan discussed with CRNA.                 EKG: Not ordered  CXR: Not ordered  Other Studies: None

## 2016-01-28 NOTE — Discharge Instructions (Signed)
SURGICAL DISCHARGE INSTRUCTIONS     Dr. Kupec, Justin T., MD  performed your COLONOSCOPY today at the [facility]    [facility]:  Monday through Friday from 6 a.m. - 7 p.m.: [phone]  Between 7 p.m. - 6 a.m., weekends and holidays:  Call [facility] at [phone] or [phone].    PLEASE SEE WRITTEN HANDOUTS AS DISCUSSED BY YOUR NURSE:      SIGNS AND SYMPTOMS OF A WOUND / INCISION INFECTION   Be sure to watch for the following:   Increase in redness or red streaks near or around the wound or incision.   Increase in pain that is intense or severe and cannot be relieved by the pain medication that your doctor has given you.   Increase in swelling that cannot be relieved by elevation of a body part, or by applying ice, if permitted.   Increase in drainage, or if yellow / green in color and smells bad. This could be on a dressing or a cast.   Increase in fever for longer than 24 hours, or an increase that is higher than 101 degrees Fahrenheit (normal body temperature is 98 degrees Fahrenheit). The incision may feel warm to the touch.    **CALL YOUR DOCTOR IF ONE OR MORE OF THESE SIGNS / SYMPTOMS SHOULD OCCUR.    ANESTHESIA INFORMATION   ANESTHESIA -- ADULT PATIENTS:  You have received intravenous sedation / general anesthesia, and you may feel drowsy and light-headed for several hours. You may even experience some forgetfulness of the procedure. DO NOT DRIVE A MOTOR VEHICLE or perform any activity requiring complete alertness or coordination until you feel fully awake in about 24-48 hours. Do not drink alcoholic beverages for at least 24 hours. Do not stay alone, you must have a responsible adult available to be with you. You may also experience a dry mouth or nausea for 24 hours. This is a normal side effect and will disappear as the effects of the medication wear off.    REMEMBER   If you experience any difficulty breathing, chest pain, bleeding that you feel is excessive,  persistent nausea or vomiting or for any other concerns:  Call your physician Dr. Kupec  at [phone] or [phone]. You may also ask to have the GI doctor on call paged. They are available to you 24 hours a day.    SPECIAL INSTRUCTIONS / COMMENTS   Instructions for Lower Endoscopy  Patient Information:  No driving until tomorrow, You may experience a small amount of blood in your stool if a biopsy has been taken  and Call in one week for biopsy results - Referring MD   REMEMBER TO:  Call your physician for any of the following symptoms:  1.) Persistent vomiting or vomiting bright red blood  2.) Fever (temperature greater than 100F)  3.) Severe abdominal pain or rigid bloated abdomen.  4.) Large amounts of bright red blood or maroon stools.      FOLLOW-UP APPOINTMENTS   Please call patient services at [phone] or [phone] to schedule a date / time of return. They are open Monday - Friday from 7:30 am - 5:00 pm.

## 2016-01-28 NOTE — Anesthesia Postprocedure Evaluation (Signed)
Anesthesia Post Op Evaluation    Patient: Kenneth Weeks  Procedure(s) Performed:Procedure(s):  COLONOSCOPY  COLONOSCOPY WITH POLYPECTOMY  Last Vitals:Temperature: 36.2 C (97.2 F) (01/28/16 1035)  Heart Rate: 93 (01/28/16 1035)  BP (Non-Invasive): 100/69 (01/28/16 1035)  Respiratory Rate: 16 (01/28/16 1035)  SpO2-1: 94 % (01/28/16 1035)  Patient is sufficiently recovered from the effects of anesthesia to participate in the evaluation and has returned to their pre-procedure level.  Patient location during evaluation: bedside       Patient participation: complete - patient participated  Level of consciousness: awake and alert and responsive to verbal stimuli  Pain score: 0  Pain management: adequate  Airway patency: patent  Anesthetic complications: no  Cardiovascular status: acceptable  Respiratory status: acceptable  Hydration status: acceptable  Patient post-procedure temperature: Pt Normothermic   PONV Status: Absent

## 2016-01-28 NOTE — Nurses Notes (Signed)
Cecum marked for anastomotic site.

## 2016-01-28 NOTE — Nurses Notes (Signed)
AVS discussed with pt and spouse; they verbalize understanding of dc instructions and deny any questions at this time. Pt exited unit via wheelchair accompanied by PCA

## 2016-01-28 NOTE — Anesthesia Transfer of Care (Signed)
ANESTHESIA TRANSFER OF CARE   Kenneth Weeks is a 65 y.o. ,male, Weight: 131.2 kg (289 lb 3.9 oz)   had Procedure(s):  COLONOSCOPY  COLONOSCOPY WITH POLYPECTOMY  performed  01/28/16   Primary Service: Sharyn Creamer, MD    Past Medical History:   Diagnosis Date    A-fib [facility])     Chronic anti-coagulant therapy; Hx cardioversions    Abdominal hernia     Ventral hernia; Left Inguinal hernia    Acute on chronic renal failure (HCC)     Biliary and gallbladder disorder     "Gallstones"    Cancer ([place])     Colon Cancer    Congestive heart failure ([place])     Diabetes mellitus ([place])     type ii diet controlled    Dysrhythmias     a fib    Hemorrhage of gastrointestinal tract 5/16    Hemorrhagic shock     Hepatic cirrhosis ([place])     Hydrocele     Hypertension 07/10/2014    Marfan syndrome     Muscle weakness     Generalized    Obesity     Syncope     Thyroid disease     hypothyroid    Transfusion history     s/p Multiple units PRC/FFP this adm.    Valvular disease     [facility] aortic valve replacement-chronic anti-coagulant therapy    Wears glasses       Allergy History as of 01/28/16     FEXOFENADINE       Noted Status Severity Type Reaction    07/26/14 1252 CoxDonnalee Curry 07/26/14 Active    Other Adverse Reaction (Add comment)    Comments:  "retain fluid"               I completed my transfer of care / handoff to the receiving personnel during which we discussed:  Access, Airway, All key/critical aspects of case discussed, Fluids/Product, Expectation of post procedure and Gave opportunity for questions and acknowledgement of understanding                                            Additional Info:Patient transferred to PACU; report given to PACU RN. Patient in stable condition and VSS.                      Last OR Temp: Temperature: 36.2 C (97.2 F)  ABG:  PH   Date Value Ref Range Status   07/13/2014 7.390 7.350 - 7.450 pH Final     PCO2   Date Value Ref Range Status   07/13/2014 37.0 36.2 - 46.2 mm  Hg Final     PO2   Date Value Ref Range Status   07/13/2014 106 (HH) 72 - 100 mm Hg Final     SODIUM   Date Value Ref Range Status   07/13/2014 133 (L) 136 - 145 mmol/L Final     POTASSIUM   Date Value Ref Range Status   10/23/2015 4.8 3.4 - 5.1 mmol/L Final   07/26/2014 4.9 3.5 - 5.1 mmol/L Final     KETONES   Date Value Ref Range Status   07/10/2014 5 (A) NEGATIVE mg/dL Final     KETONES,URINE   Date Value Ref Range Status   07/09/2014 NEGATIVE NEGATIVE mg/dL Final  WHOLE BLOOD K+   Date Value Ref Range Status   07/13/2014 3.9 3.5 - 5.0 mmol/L Final     CHLORIDE   Date Value Ref Range Status   07/13/2014 107 96 - 111 mmol/L Final     CALCIUM   Date Value Ref Range Status   10/23/2015 9.3 8.8 - 10.2 mg/dL Final   07/26/2014 9.4 8.5 - 10.4 mg/dL Final     Calculated R Axis   Date Value Ref Range Status   10/23/2015 16 degrees Final     Calculated T Axis   Date Value Ref Range Status   10/23/2015 139 degrees Final     IONIZED CALCIUM   Date Value Ref Range Status   07/13/2014 1.14 (L) 1.30 - 1.46 mmol/L Final     GLUCOSE, POINT OF CARE   Date Value Ref Range Status   07/18/2014 123 (H) 70 - 105 mg/dL Final     LACTATE   Date Value Ref Range Status   07/13/2014 0.7 <1.3 mmol/L Final     HEMOGLOBIN   Date Value Ref Range Status   07/13/2014 8.9 (L) 14.0 - 18.0 g/dL Final     OXYHEMOGLOBIN   Date Value Ref Range Status   07/13/2014 96.0 85.0 - 98.0 % Final     CARBOXYHEMOGLOBIN   Date Value Ref Range Status   07/13/2014 1.8 0.0 - 2.5 % Final     MET-HEMOGLOBIN   Date Value Ref Range Status   07/13/2014 1.2 0.0 - 3.0 % Final     BASE EXCESS   Date Value Ref Range Status   07/13/2014 Test Not Performed 0.0 - 3.0 mmol/L Final     BASE DEFICIT   Date Value Ref Range Status   07/13/2014 2.3 0.0 - 3.0 mmol/L Final     BICARBONATE   Date Value Ref Range Status   07/13/2014 23.1 20.0 - 29.0 mmol/L Final     TEMPERATURE, COMP   Date Value Ref Range Status   07/13/2014 36.0 15.0 - 40.0 C Final     %FIO2   Date Value Ref Range  Status   07/13/2014 58 21 - 100 % Final     Airway:   Blood pressure 100/69, pulse 93, temperature 36.2 C (97.2 F), resp. rate 16, height 1.905 m ('6\' 3"' ), weight 131.2 kg (289 lb 3.9 oz), SpO2 94 %.

## 2016-01-28 NOTE — H&P (Signed)
[facility]   GI Admission History and Physical      Kenneth Weeks, Kenneth Weeks   MRN:  [id number]  Date of Birth:  November 25, 1950    Date of Procedure:  01/28/2016    Chief Complaint: cecal cancer, resected    HPI: Kenneth Weeks, Kenneth Weeks is a 65 y.o. year old male who presents today for Colonoscopy.   This is the patient's 3rd colonoscopy..    This procedure is being done to evaluate cecal cancer, resected.    The patient denies abdominal pain.    Past Medical History:   Diagnosis Date    A-fib [facility])     Chronic anti-coagulant therapy; Hx cardioversions    Abdominal hernia     Ventral hernia; Left Inguinal hernia    Acute on chronic renal failure (HCC)     Biliary and gallbladder disorder     "Gallstones"    Cancer ([place])     Colon Cancer    Congestive heart failure ([place])     Diabetes mellitus ([place])     type ii diet controlled    Dysrhythmias     a fib    Hemorrhage of gastrointestinal tract 5/16    Hemorrhagic shock     Hepatic cirrhosis ([place])     Hydrocele     Hypertension 07/10/2014    Marfan syndrome     Muscle weakness     Generalized    Obesity     Syncope     Thyroid disease     hypothyroid    Transfusion history     s/p Multiple units PRC/FFP this adm.    Valvular disease     [facility] aortic valve replacement-chronic anti-coagulant therapy    Wears glasses            Allergies   Allergen Reactions    Allegra [Fexofenadine]  Other Adverse Reaction (Add comment)     "retain fluid"       Medications Prior to Admission     Prescriptions    docusate sodium (COLACE) 100 mg Oral Capsule    Take 1 Cap (100 mg total) by mouth Twice daily    furosemide (LASIX) 40 mg Oral Tablet    Take 1 Tab (40 mg total) by mouth Twice daily    levothyroxine (SYNTHROID) 200 mcg Oral Tablet    TAKE 1 TABLET BY MOUTH ONCE DAILY    MAGNESIUM CARBONATE ORAL    Take by mouth Once a day    POTASSIUM GLUCONATE ORAL    Take by mouth Once a day    spironolactone (ALDACTONE) 50 mg Oral Tablet    TAKE ONE TABLET BY  MOUTH ONE TIME DAILY    warfarin (COUMADIN) 5 mg Oral Tablet    Take 1.5 Tabs (7.5 mg total) by mouth Once a day           Past Surgical History:   Procedure Laterality Date    COLONOSCOPY      HX COLONOSCOPY      HX HEART CATHETERIZATION      HX HEART VALVE SURGERY  1999    [facility]     HX HEMICOLECTOMY Right 07/13/2014    Laparoscopic right hemicolectomy for colon cancer    HX TONSIL AND ADENOIDECTOMY      HX TONSILLECTOMY      HX UPPER ENDOSCOPY  07/09/14    HX WISDOM TEETH EXTRACTION             Physical Exam:  General: NAD, appears well  HEENT: EOMI, Airway patent, Trachea midline, No large goiters or neck lymphadenopathy  Cardio/Pulmonary: No cyanosis, edema. Normal pulses. Good capillary refill  Abdomen: Soft, nondistended, nontender    Assessment:  cecal cancer, resected    Plan:  Proceed with Colonoscopy.     No orders of the defined types were placed in this encounter.        Sharyn Creamer, MD, [facility]   01/28/2016, 09:17  Assistant Professor of Medicine  Section of [place] / [place]

## 2016-01-28 NOTE — Care Plan (Signed)
Problem: Perioperative Period (Adult)  Prevent and manage potential problems including:1. bleeding2. gastrointestinal complications3. hypothermia4. infection5. pain6. perioperative injury7. respiratory compromise8. situational response9. urinary retention10. venous [id number]. wound complications   Goal: Signs and Symptoms of Listed Potential Problems Will be Absent or Manageable (Perioperative Period)  Signs and symptoms of listed potential problems will be absent or manageable by discharge/transition of care (reference Perioperative Period (Adult) CPG).   Outcome: Adequate for Discharge Date Met:  01/28/16

## 2016-01-29 ENCOUNTER — Encounter (INDEPENDENT_AMBULATORY_CARE_PROVIDER_SITE_OTHER): Payer: Self-pay | Admitting: Gastroenterology

## 2016-01-29 LAB — HISTORICAL SURGICAL PATHOLOGY SPECIMEN

## 2016-02-13 NOTE — Telephone Encounter (Signed)
Can you close encounter

## 2016-02-17 ENCOUNTER — Ambulatory Visit: Payer: Medicare Other | Attending: INTERNAL MEDICINE

## 2016-02-17 ENCOUNTER — Ambulatory Visit (INDEPENDENT_AMBULATORY_CARE_PROVIDER_SITE_OTHER): Payer: Medicare Other | Admitting: INTERNAL MEDICINE

## 2016-02-17 VITALS — BP 138/74 | HR 62 | Temp 96.8°F | Ht 75.2 in | Wt 296.0 lb

## 2016-02-17 DIAGNOSIS — I5030 Unspecified diastolic (congestive) heart failure: Secondary | ICD-10-CM

## 2016-02-17 DIAGNOSIS — Z7901 Long term (current) use of anticoagulants: Secondary | ICD-10-CM | POA: Insufficient documentation

## 2016-02-17 DIAGNOSIS — Z5181 Encounter for therapeutic drug level monitoring: Secondary | ICD-10-CM | POA: Insufficient documentation

## 2016-02-17 DIAGNOSIS — C189 Malignant neoplasm of colon, unspecified: Secondary | ICD-10-CM

## 2016-02-17 DIAGNOSIS — I4891 Unspecified atrial fibrillation: Secondary | ICD-10-CM

## 2016-02-17 DIAGNOSIS — E039 Hypothyroidism, unspecified: Secondary | ICD-10-CM

## 2016-02-17 LAB — PT/INR
INR: 2.8
PROTHROMBIN TIME: 30.8 s — ABNORMAL HIGH (ref 9.4–12.5)

## 2016-02-17 MED ORDER — WARFARIN 5 MG TABLET
7.5000 mg | ORAL_TABLET | Freq: Every day | ORAL | 4 refills | Status: DC
Start: 2016-02-17 — End: 2017-05-21

## 2016-02-17 MED ORDER — LEVOTHYROXINE 200 MCG TABLET
ORAL_TABLET | ORAL | 3 refills | Status: DC
Start: 2016-02-17 — End: 2017-02-11

## 2016-02-17 NOTE — Nursing Note (Signed)
BP 138/74   Pulse 62   Temp 36 C (96.8 F)   Ht 1.91 m (6' 3.2")   Wt 134.3 kg (296 lb)   SpO2 98%   BMI 36.8 kg/m2  Gretchen Short, CMA  02/17/2016, 09:11

## 2016-02-17 NOTE — Progress Notes (Signed)
[facility]   [address]   [place], [place] [address]  [phone]  [phone]    Chronic Problems Office Visit     SUBJECTIVE   Kenneth Weeks is a 66 y.o. male here for follow-up on anticoagulation; colon cancer surgery; and additional primary care issues.     Since I saw him last, he had his colonoscopy done in [place].  His colon biopsy was hyperplastic tissue.     This is a planned office visit.     Since our last visit together, has experienced the following:   ER -- none   Urgent care visits -- none   Accidents with a car -- nothing   Injuries at home which resulted in injury -- no falls at the house   Medications from other providers -- none new from other providers   Surgeries or other procedures --colonoscopy was the last procedure    Vision -- no new damage     Hearing -- no acute changes     Sources of pain which are new -- left heel pain     Chewing/swallowing -- no choking     Bowels -- no constipation    Medications reviewed by me personally:   Warfarin 7.62m daily in the AM   Synthroid 2034m daily in the AM   Lasix 40 mg BID   Spironolactone 50 mg daily in the AM  Potassium daily -- OTC supplement   Warfarin 10 mg daily   Magnesium tablet   Vitamin D3   Calcium OTC     Physical activity  30 minutes on the treadmill. 3 days/week        Changes in family history since we last met --  [place]     Changes in social history since we last met -- same home; no changes     Medications reviewed    Current Outpatient Prescriptions:   .  docusate sodium (COLACE) 100 mg Oral Capsule, Take 1 Cap (100 mg total) by mouth Twice daily, Disp: , Rfl: 0  .  furosemide (LASIX) 40 mg Oral Tablet, Take 1 Tab (40 mg total) by mouth Twice daily, Disp: 180 Tab, Rfl: 3  .  levothyroxine (SYNTHROID) 200 mcg Oral Tablet, TAKE 1 TABLET BY MOUTH ONCE DAILY, Disp: 90 Tab, Rfl: 3  .  MAGNESIUM CARBONATE ORAL, Take by mouth Once a day, Disp: , Rfl:   .  POTASSIUM  GLUCONATE ORAL, Take by mouth Once a day, Disp: , Rfl:   .  spironolactone (ALDACTONE) 50 mg Oral Tablet, TAKE ONE TABLET BY MOUTH ONE TIME DAILY, Disp: 90 Tab, Rfl: 3  .  warfarin (COUMADIN) 5 mg Oral Tablet, Take 1.5 Tabs (7.5 mg total) by mouth Once a day, Disp: 100 Tab, Rfl: 4  No current facility-administered medications for this visit.     Facility-Administered Medications Ordered in Other Visits:   .  iopamidol (ISOVUE-370) 76% infusion, 100 mL, Intravenous, Give in Radiology, JuLars MassonMD      Review of Systems -     Constitutional: negative for fevers  Eyes: no new blurry  ENT: no acute hearing loss   Cardiovascular: no new chest pain when walking   Respiratory: no new shortness of breath   GI: no constipation   GU: no difficulty urinating   Musculoskeletal: heel pain is about the same as before   Neurological: no new seizure or headaches   Emotional/Pscychiatric: no depression or anxiety  Skin: stasis changes in BL legs   Endocrine: no new diabetes   Hematologic/Lymphatic: no DVT or bleeding issues      History   Smoking Status   . Never Smoker   Smokeless Tobacco   . Never Used       OBJECTIVE:  BP 138/74  Pulse 62  Temp 36 C (96.8 F)  Ht 1.91 m (6' 3.2")  Wt 134.3 kg (296 lb)  SpO2 98%  BMI 36.8 kg/m2    Counseling done about the following face-to-face:     Labs reviewed by me   Drug-drug interactions considered and reviewed by me     Physical Exam     General -- alert     Repeat BP taken by me in the right arm -- 138/80   HEENT -- PERRL, EOMI   Heart --mechanical click   Neck -- Thyroid is normal sized  Lungs -- clear and no wheezing; no pleurisy   Abdomen -- soft and tender  Neuro -- normal gait and tone; strength is 5/5  Skin -- no rashes noted   Psych -- pleasant and smiling   Vascular -- legs are not swollen; smooth, shiny skin        Labs -- reviewed by me in the EMR   December 2017    Colonic biopsy -- hyperplastic polyp only     September 2017    Creatinine 1.21    Hgb 15.18 August 2015    Hgb 15.7   Creatinine 1.14   CT scan of chest -- minimal lung nodules, not changed; not big     March 2017    INR 2.17 January 2015    Dr. Burke Keels note: no evidence of any metastatic disease    Dr. Dianah Field note: pT2N0 stage I colon cancer    Hgb 11.8    Creatinine 1.04    Glucose 99   LFT's okay   CEA 1.5   CT chest/abdomen/pelvis -- no evidence of recurrent disease; some small lung nodules     June 2016   [facility] Note:     Follow up colonoscopy in December 2016     CT scan of chest/abdomen/pelvis in summer 2017     May 2016   Creatinine 1.27   Glucose 136 (not fasting)    LFT's okay     March 2016    Creatinine 1.02   TSH 1.23   LFT's okay     Medications -- reviewed by me; drug-drug interactions considered by me      Counseling provided directly face-to-face about: more exercise on a regular basis       ASSESSMENT AND PLAN:  Ventral hernia repair  -- never got this done. Leave this be for now     S/P laporoscopic right hemicolectomy with stapled anastomosis for stage II colon cancer -- had a clean colonoscopy in December 2016 and December 2017. 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 -current lasix dosing of 74m twice per day     Warfarin management for mechanical heart valve -- INR at goal last check     Atrial fibrilation -- he does not have any limiting tachycardia     Hypothyroidism -- on replacement     Screening for Diabetes --fasting sugar was okay in the last year     Obesity -- we talked about this today  Vision -- glasses; no changes     Hearing issues -- not limited     Exercise --  He is walking on a regular basis on the treadmill     Need for HIV or hepatitis C screening -- no reason to screen     Tobacco abuse screening -- none at all     Alcohol and drug abuse screening -- none at all     Depression/anxiety screening -- he denies any at this time     Screening for colorectal cancer -- he is not due for screening      Immunizations -- I recommend a yearly flu shot       DISPOSITION:   Check INR today   Continue current medications     New Rx's written today for Synthroid and warfarin, per his request     Follow up with me in 6 months sooner PRN

## 2016-02-18 ENCOUNTER — Ambulatory Visit (INDEPENDENT_AMBULATORY_CARE_PROVIDER_SITE_OTHER): Payer: Self-pay

## 2016-02-18 NOTE — Progress Notes (Unsigned)
Pt notified of INR results and verbalized understanding. Pt has F/U lab appt on 03/19/2016. Pt's current dose of warfarin is a 5 mg tab which Pt takes 1 1/2 tabs daily.

## 2016-02-18 NOTE — Progress Notes (Signed)
02/18/16 0800   [facility]/INR   Date INR drawn 02/18/16   INR 2.8   New warfarin dosage INR is at goal   New weekly dosgae Keep the same   Comments Check INR in one month   Initials RTM   Anticoag Indication Valve   Goal INR 2.5 - 3.5

## 2016-03-05 ENCOUNTER — Ambulatory Visit
Admission: RE | Admit: 2016-03-05 | Discharge: 2016-03-05 | Disposition: A | Discharge status: Home / Self Care | Payer: Medicare Other | Source: Ambulatory Visit | Attending: Student in an Organized Health Care Education/Training Program | Admitting: Student in an Organized Health Care Education/Training Program

## 2016-03-05 ENCOUNTER — Ambulatory Visit (HOSPITAL_BASED_OUTPATIENT_CLINIC_OR_DEPARTMENT_OTHER): Payer: Medicare Other

## 2016-03-05 ENCOUNTER — Ambulatory Visit (HOSPITAL_BASED_OUTPATIENT_CLINIC_OR_DEPARTMENT_OTHER): Payer: Medicare Other | Admitting: Student in an Organized Health Care Education/Training Program

## 2016-03-05 ENCOUNTER — Ambulatory Visit (HOSPITAL_BASED_OUTPATIENT_CLINIC_OR_DEPARTMENT_OTHER): Payer: Medicare Other | Admitting: Surgery

## 2016-03-05 ENCOUNTER — Encounter (HOSPITAL_BASED_OUTPATIENT_CLINIC_OR_DEPARTMENT_OTHER): Payer: Self-pay | Admitting: Student in an Organized Health Care Education/Training Program

## 2016-03-05 ENCOUNTER — Other Ambulatory Visit (HOSPITAL_COMMUNITY): Payer: Self-pay

## 2016-03-05 VITALS — BP 150/88 | HR 92 | Temp 97.3°F | Resp 20 | Ht 75.0 in | Wt 290.8 lb

## 2016-03-05 DIAGNOSIS — E119 Type 2 diabetes mellitus without complications: Secondary | ICD-10-CM

## 2016-03-05 DIAGNOSIS — Z79899 Other long term (current) drug therapy: Secondary | ICD-10-CM | POA: Insufficient documentation

## 2016-03-05 DIAGNOSIS — E669 Obesity, unspecified: Secondary | ICD-10-CM | POA: Insufficient documentation

## 2016-03-05 DIAGNOSIS — K7460 Unspecified cirrhosis of liver: Secondary | ICD-10-CM | POA: Insufficient documentation

## 2016-03-05 DIAGNOSIS — K922 Gastrointestinal hemorrhage, unspecified: Secondary | ICD-10-CM

## 2016-03-05 DIAGNOSIS — C182 Malignant neoplasm of ascending colon: Secondary | ICD-10-CM

## 2016-03-05 DIAGNOSIS — I509 Heart failure, unspecified: Secondary | ICD-10-CM | POA: Insufficient documentation

## 2016-03-05 DIAGNOSIS — Z9889 Other specified postprocedural states: Secondary | ICD-10-CM | POA: Insufficient documentation

## 2016-03-05 DIAGNOSIS — Z7901 Long term (current) use of anticoagulants: Secondary | ICD-10-CM | POA: Insufficient documentation

## 2016-03-05 DIAGNOSIS — Z6836 Body mass index (BMI) 36.0-36.9, adult: Secondary | ICD-10-CM | POA: Insufficient documentation

## 2016-03-05 DIAGNOSIS — R5383 Other fatigue: Secondary | ICD-10-CM | POA: Insufficient documentation

## 2016-03-05 DIAGNOSIS — C189 Malignant neoplasm of colon, unspecified: Secondary | ICD-10-CM

## 2016-03-05 DIAGNOSIS — Q8740 Marfan's syndrome, unspecified: Secondary | ICD-10-CM | POA: Insufficient documentation

## 2016-03-05 DIAGNOSIS — K432 Incisional hernia without obstruction or gangrene: Secondary | ICD-10-CM

## 2016-03-05 DIAGNOSIS — R739 Hyperglycemia, unspecified: Secondary | ICD-10-CM

## 2016-03-05 DIAGNOSIS — Z85038 Personal history of other malignant neoplasm of large intestine: Secondary | ICD-10-CM | POA: Insufficient documentation

## 2016-03-05 DIAGNOSIS — Z08 Encounter for follow-up examination after completed treatment for malignant neoplasm: Secondary | ICD-10-CM | POA: Insufficient documentation

## 2016-03-05 DIAGNOSIS — Z8249 Family history of ischemic heart disease and other diseases of the circulatory system: Secondary | ICD-10-CM | POA: Insufficient documentation

## 2016-03-05 DIAGNOSIS — Z888 Allergy status to other drugs, medicaments and biological substances status: Secondary | ICD-10-CM | POA: Insufficient documentation

## 2016-03-05 DIAGNOSIS — E1165 Type 2 diabetes mellitus with hyperglycemia: Secondary | ICD-10-CM | POA: Insufficient documentation

## 2016-03-05 DIAGNOSIS — Z952 Presence of prosthetic heart valve: Secondary | ICD-10-CM | POA: Insufficient documentation

## 2016-03-05 DIAGNOSIS — I4891 Unspecified atrial fibrillation: Secondary | ICD-10-CM | POA: Insufficient documentation

## 2016-03-05 DIAGNOSIS — I10 Essential (primary) hypertension: Secondary | ICD-10-CM | POA: Insufficient documentation

## 2016-03-05 DIAGNOSIS — Z9049 Acquired absence of other specified parts of digestive tract: Secondary | ICD-10-CM | POA: Insufficient documentation

## 2016-03-05 DIAGNOSIS — R7989 Other specified abnormal findings of blood chemistry: Secondary | ICD-10-CM | POA: Insufficient documentation

## 2016-03-05 DIAGNOSIS — Z801 Family history of malignant neoplasm of trachea, bronchus and lung: Secondary | ICD-10-CM | POA: Insufficient documentation

## 2016-03-05 DIAGNOSIS — Z8379 Family history of other diseases of the digestive system: Secondary | ICD-10-CM | POA: Insufficient documentation

## 2016-03-05 DIAGNOSIS — R531 Weakness: Secondary | ICD-10-CM | POA: Insufficient documentation

## 2016-03-05 DIAGNOSIS — E039 Hypothyroidism, unspecified: Secondary | ICD-10-CM | POA: Insufficient documentation

## 2016-03-05 LAB — CBC WITH DIFF
BASOPHIL #: 0.03 x10ˆ3/uL (ref 0.00–0.20)
BASOPHIL %: 0 %
EOSINOPHIL #: 0.04 x10ˆ3/uL (ref 0.00–0.50)
EOSINOPHIL %: 1 %
HCT: 47.3 % — ABNORMAL HIGH (ref 36.7–47.0)
HGB: 16.1 g/dL (ref 12.5–16.3)
LYMPHOCYTE #: 1.1 x10ˆ3/uL (ref 1.00–4.80)
LYMPHOCYTE %: 16 %
MCH: 31.9 pg (ref 27.4–33.0)
MCHC: 34.1 g/dL (ref 32.5–35.8)
MCV: 93.6 fL (ref 78.0–100.0)
MONOCYTE #: 0.35 x10ˆ3/uL (ref 0.30–1.00)
MONOCYTE %: 5 %
MPV: 10.1 fL (ref 7.5–11.5)
NEUTROPHIL #: 5.19 x10ˆ3/uL (ref 1.50–7.70)
NEUTROPHIL %: 78 %
PLATELETS: 143 x10ˆ3/uL (ref 140–450)
RBC: 5.05 x10ˆ6/uL (ref 4.06–5.63)
RDW: 13.3 % (ref 12.0–15.0)
WBC: 6.7 x10ˆ3/uL (ref 3.5–11.0)

## 2016-03-05 LAB — BASIC METABOLIC PANEL
ANION GAP: 7 mmol/L (ref 4–13)
ANION GAP: 6 mmol/L (ref 4–13)
BUN/CREA RATIO: 13 (ref 6–22)
BUN/CREA RATIO: 15 (ref 6–22)
BUN: 16 mg/dL (ref 8–25)
BUN: 15 mg/dL (ref 8–25)
CALCIUM: 9.1 mg/dL (ref 8.5–10.2)
CALCIUM: 8.8 mg/dL (ref 8.5–10.2)
CHLORIDE: 99 mmol/L (ref 96–111)
CHLORIDE: 100 mmol/L (ref 96–111)
CO2 TOTAL: 22 mmol/L (ref 22–32)
CO2 TOTAL: 24 mmol/L (ref 22–32)
CREATININE: 1.19 mg/dL (ref 0.62–1.27)
CREATININE: 1.01 mg/dL (ref 0.62–1.27)
ESTIMATED GFR: >59 mL/min/1.73mˆ2 (ref >59)
ESTIMATED GFR: >59 mL/min/1.73mˆ2 (ref >59)
GLUCOSE: 341 mg/dL — ABNORMAL HIGH (ref 65–139)
GLUCOSE: 179 mg/dL — ABNORMAL HIGH (ref 65–139)
POTASSIUM: 4.6 mmol/L (ref 3.5–5.1)
POTASSIUM: 3.7 mmol/L (ref 3.5–5.1)
SODIUM: 128 mmol/L — ABNORMAL LOW (ref 136–145)
SODIUM: 130 mmol/L — ABNORMAL LOW (ref 136–145)

## 2016-03-05 LAB — COMPREHENSIVE METABOLIC PANEL, NON-FASTING
ALBUMIN: 3.4 g/dL (ref 3.4–4.8)
ALKALINE PHOSPHATASE: 104 U/L (ref <150)
ALT (SGPT): 21 U/L (ref <55)
ANION GAP: 6 mmol/L (ref 4–13)
AST (SGOT): 18 U/L (ref 8–48)
BILIRUBIN TOTAL: 1.2 mg/dL (ref 0.3–1.3)
BUN/CREA RATIO: 12 (ref 6–22)
BUN: 18 mg/dL (ref 8–25)
CALCIUM: 9.1 mg/dL (ref 8.5–10.2)
CHLORIDE: 97 mmol/L (ref 96–111)
CO2 TOTAL: 28 mmol/L (ref 22–32)
CREATININE: 1.48 mg/dL — ABNORMAL HIGH (ref 0.62–1.27)
ESTIMATED GFR: 50 mL/min/1.73mˆ2 — ABNORMAL LOW (ref >59)
GLUCOSE: 518 mg/dL (ref 65–139)
POTASSIUM: 4.9 mmol/L (ref 3.5–5.1)
POTASSIUM: 4.9 mmol/L (ref 3.5–5.1)
PROTEIN TOTAL: 7.4 g/dL (ref 6.0–8.0)
SODIUM: 131 mmol/L — ABNORMAL LOW (ref 136–145)

## 2016-03-05 LAB — HGA1C (HEMOGLOBIN A1C WITH EST AVG GLUCOSE)
ESTIMATED AVERAGE GLUCOSE: 220 mg/dL
HEMOGLOBIN A1C: 9.3 % — ABNORMAL HIGH (ref 4.0–6.0)

## 2016-03-05 LAB — POC BLOOD GLUCOSE (RESULTS)
GLUCOSE, POC: 405 mg/dL (ref 70–105)
GLUCOSE, POC: 383 mg/dL — ABNORMAL HIGH (ref 70–105)
GLUCOSE, POC: 267 mg/dL — ABNORMAL HIGH (ref 70–105)

## 2016-03-05 LAB — PT/INR
INR: 2.3 — ABNORMAL HIGH (ref 0.80–1.20)
PROTHROMBIN TIME: 26.5 s — ABNORMAL HIGH (ref 9.3–13.9)

## 2016-03-05 LAB — CARCINOEMBRYONIC ANTIGEN: CEA: 3.8 ng/mL (ref <=5.0)

## 2016-03-05 LAB — PLATELETS AND ANC CANCER CENTER
PLATELET COUNT (AUTO): 143 x10ˆ3/uL (ref 140–450)
PMN ABS (AUTO): 5.19 x10ˆ3/uL (ref 1.50–7.70)

## 2016-03-05 LAB — MAGNESIUM: MAGNESIUM: 2.3 mg/dL (ref 1.6–2.5)

## 2016-03-05 MED ORDER — INSULIN LISPRO (U-100) 100 UNIT/ML SUBCUTANEOUS SOLUTION
10.0000 [IU] | SUBCUTANEOUS | Status: AC
Start: 2016-03-05 — End: 2016-03-05
  Administered 2016-03-05: 10 [IU] via SUBCUTANEOUS
  Filled 2016-03-05: qty 3

## 2016-03-05 MED ORDER — SODIUM CHLORIDE 0.9 % INTRAVENOUS SOLUTION
INTRAVENOUS | Status: DC
Start: 2016-03-05 — End: 2016-03-06
  Administered 2016-03-05: 0 via INTRAVENOUS

## 2016-03-05 MED ORDER — SODIUM CHLORIDE 0.9 % IV BOLUS
1000.0000 mL | INJECTION | Freq: Once | Status: AC
Start: 2016-03-05 — End: 2016-03-05
  Administered 2016-03-05: 1000 mL via INTRAVENOUS
  Administered 2016-03-05: 0 mL via INTRAVENOUS

## 2016-03-05 MED ORDER — INSULIN SYRINGE U-100 WITH NEEDLE 0.3 ML 30 GAUGE X 1/2": Syringe | 0 refills | 0 days | Status: DC

## 2016-03-05 MED ORDER — INSULIN LISPRO (U-100) 100 UNIT/ML SUBCUTANEOUS PEN
PEN_INJECTOR | SUBCUTANEOUS | 0 refills | Status: DC
Start: 2016-03-05 — End: 2016-03-05

## 2016-03-05 MED ORDER — INSULIN LISPRO (U-100) 100 UNIT/ML SUBCUTANEOUS SOLUTION
10.0000 [IU] | Freq: Once | SUBCUTANEOUS | Status: AC
Start: 2016-03-05 — End: 2016-03-05
  Administered 2016-03-05: 10 [IU] via SUBCUTANEOUS
  Filled 2016-03-05: qty 3

## 2016-03-05 MED ORDER — BARIUM SULFATE 2.1 % (W/V), 2.0 % (W/W) ORAL SUSPENSION
450.00 mL | Freq: Once | ORAL | 0 refills | Status: AC
Start: 2016-03-05 — End: 2016-03-05

## 2016-03-05 MED ORDER — INSULIN LISPRO (U-100) 100 UNIT/ML SUBCUTANEOUS PEN
PEN_INJECTOR | SUBCUTANEOUS | 0 refills | Status: DC
Start: 2016-03-05 — End: 2016-12-14

## 2016-03-05 NOTE — Nurses Notes (Addendum)
[facility] reports critical lab result of glucose 518.   1035- Dr. Landry Corporal informed of glucose and creatinine 1.48. Dr. Landry Corporal would like pt to receive hydration and insulin. Verbal orders to obtain fingerstick to confirm hyperglycemia. Called infusion charge and no available chairs at this time. Clinic charge nurse has agreed to administered hydration/insulin downstairs. Dr. Deon Pilling in seeing pt at this time. I will place PIV.   Blood in lab placed for HGA1C at this time. APratt, RN  1130- PIV 24G placed in right forearm. NS 1L over 1 hour ordered. 10 units of insulin being ordered. CT scan is being rescheduled from today to next visit in 3 months, due to elevated creatinine. If Dr. Catarina Hartshorn needs CT done sooner, he can reschedule for it to be done sooner. APratt, [place] blood sugar in 1 hour post administering insulin. APratt, RN  1315- Glucose recheck 383. Per Dr. Landry Corporal, give 10 units of humalog insulin and rechecked BP in one hour. APratt, RN  1334- 10 units of Humalog administered at this time by patient's wife after teaching was provided by RN. BMP/mag drawn from PIV and sent. Second liter of hydration began as per [facility]. [place]  [phone]- Results from [facility] were hemolysed. Redrawing labs at this time per Dr. Landry Corporal. Per Dr. Landry Corporal increase rate of NS to 1024ml/hr. Glucose recheck was 267. No new orders. I called pt PCP Dr. Neville Route at [phone]. They scheduled the pt to be seen at 845 tomorrow morning. When pt is discharged. He needs to go to [facility] to pick up insulin needles. APratt, RN  1615- Labs back Na 130. Paged Dr. Landry Corporal. Hydration stopped. Pt PIV removed. Gauze and coflex applied to site. Pt ambulatory with wife  APratt, RN

## 2016-03-05 NOTE — Progress Notes (Signed)
Patient: Kenneth Weeks  D.O.B.: December 09, 1950  MRN# [id number]  Date of Service: 03/05/2016    I am seeing Kenneth Weeks today in followup at the [facility] surgical oncology outpatient clinic. He is a 66 y.o. male, approximately 18 months status post laparoscopic right hemicolectomy for T2N0 colon cancer. He has been ins surveillance since surgery and has been NED. He missed his last appointment with me in June 2017, but returns today. He has been doing well, but this morning prior to getting his CT scan, he had blood work done which showed his Cr was elevated at 1.48 and his glucose was greater than 500. He did not get the scan because of the elevated Cr. He has received insulin here in the cancer center as well as a bag of saline. He was scheduled to see his PCP tomorrow    In clinic, he looks well. The abdomen is soft. There is no palpable mass or jaundice. There is a ventral hernia at the extraction site.    HgbA1c level today is 9.3    My assessment is doing well from a colon cancer standpoint. He will get the scan that was ordered for today sometime in the next month or so, but I suspect he will remain NED given the fact that his primary lesion was stage 1 and therefore very likely to have been cured surgically. In regards to his hernia, I do not think he is a candidate for elective repair based on his elevated A1c level, his Marfan's disease and his obesity. In regards to his diabetes, he is seeing you tomorrow to get this under better control. I will see him in 6 months with a repeat CT scan and office visit.    I counseled the patient for 10 minutes of the 15 minute visit about his disease.       I am grateful to be able to participate in the care of [employer]. I will keep you informed of his progress.      Warmest Regards,    [name], MD  Assistant Professor   [facility] Department of Surgery  Division of Surgical Oncology

## 2016-03-05 NOTE — Cancer Center Note (Signed)
PRINCIPAL DIAGNOSIS: pT2N0 Stage I colon cancer    ONCOLOGICAL HISTORY:  April 2016-Large right hydrocele that was drained. He gained 60 lbs of water weight post-operatively and was put on lasix. There was concern for infection too and he was started on antibiotocs as well.   Jul 09, 2014. He presented with acute onset of rectal bleeding with bloody diarrhea and generalized weakness. He has been on anticoagulation for chronic atrial fibrillation aand St Jude metal valve replacement in 1999.  His INR shot up to 10.6 at that time from antibiotics & coumadin interaction  Jul 10, 2014, Colonoscopy was performed. It showed large mass on 1st fold rigth colon covering 1/3wall circumference. Clean, superficial ulcer overlying mass. No active bleeding on initial inspection. Multiple biopsies obtained with cold biopsy forceps. Pathology of ascending colon mass revealed adenocarcinoma  Jun 15, 2014. He underwent laproscopic right hemicolectomy. Pathology revealed moderately differentiated adenocarcinoma, resection margins free, 0/30 nodes, LVI neg, perineural invasion neg. pT2N0  Jan 23, 2015. Colonoscopy revealed a pedunculated polyp measuring 10 mm in size in the sigmoid colon. A polypectomy was performed using snare cautery. The resection was complete, the polyp tissue was completely retrieved. Pathology c/w tubulovillous adenoma    INTERIM HISTORY:  A 66 yr old male with Hx of Marfans, mechanical aortic valve (1999) (on anticoagulation), aFib (s/p cardioversion), DM, hypothyroidism who presented to [facility] w/ bloody diarrhea, supratherapeutic INR and hypotension.Colonoscopy remarkable for large mass along the 1st fold of the ascending colon concerning for malignancy. S/P laproscopic right hemicolectomy 06/15/2014. PT2N0, Stage 1 colon cancer. He is on active surveillance. He presents to office today for 94-month follow up. He is doing well, except more discomfort from his abdominal hernia. He often notices bulge and  discomfort after eating and activity. It remains reducible and is seeing Dr. Catarina Hartshorn today in follow up for that. He denies any N/V. He is moving his bowels regularly, denies any diarrhea or constipation, or blood per rectum.  Last colonoscopy in Dec 2017 was normal. Repeat recommended in 3 years. Appetite is good. He continues to be on lasix 40 mg bid.  He has diet controlled DM. Checks FBG once weekly which has been about 130. No increased thirst or polyuria. He also follows with his PCP in [place].    REVIEW OF SYSTEMS:  General: (-) pain. (-) fevers (-) chills. (-) weight loss. (+) fatigue.  Lymphatic: (-) palpable masses. (-) night sweats.  Heme: (-) easy bruising (-) bleeding. (-) recurrent infections.   HEENT. (-) vision changes (-) hearing changes. (-) dysphagia. (-) sore throat.   Heart: (+) mechanical valve (-) chest pain. (-) palpitation. (-) orthopnea. (-) LE edema.   Lungs: (-) dyspnea (on exertion) (-) hemoptysis. (-) cough.   Abdomen: (-) poor appetite. (+) abdominal pain. (-) nausea (-) vomiting. (-) diarrhea. (-) constipation.   GU: (-) dysuria (-) Urgency. (-) Hematuria.   MS. (-) joint pain (-) ext swelling. (-) Back pain.   Dermatologic: (-) rashes.    Psychiatric: (-) Depression. (-) anxiety. (-) insomnia.   Neurologic: (-) headaches. (-) neuropathy. (-) weakness. (-) memory problems.  Endocrine:(+) hypothyroidism  Other review of systems negative.       Past Medical History  Current Outpatient Prescriptions   Medication Sig    cholecalciferol, vitamin D3, 1,000 unit Oral Tablet Take 1,000 Units by mouth Once a day    docusate sodium (COLACE) 100 mg Oral Capsule Take 1 Cap (100 mg total) by mouth Twice daily  furosemide (LASIX) 40 mg Oral Tablet Take 1 Tab (40 mg total) by mouth Twice daily    levothyroxine (SYNTHROID) 200 mcg Oral Tablet TAKE 1 TABLET BY MOUTH ONCE DAILY    MAGNESIUM CARBONATE ORAL Take by mouth Once a day    POTASSIUM GLUCONATE ORAL Take by mouth Once a  day    spironolactone (ALDACTONE) 50 mg Oral Tablet TAKE ONE TABLET BY MOUTH ONE TIME DAILY    warfarin (COUMADIN) 5 mg Oral Tablet Take 1.5 Tabs (7.5 mg total) by mouth Once a day     Allergies   Allergen Reactions    Allegra [Fexofenadine]  Other Adverse Reaction (Add comment)     "retain fluid"     Past Medical History:   Diagnosis Date    A-fib (HCC)     Chronic anti-coagulant therapy; Hx cardioversions    Abdominal hernia     Ventral hernia; Left Inguinal hernia    Acute on chronic renal failure (HCC)     Biliary and gallbladder disorder     "Gallstones"    Cancer ([place])     Colon Cancer    Congestive heart failure ([place])     Diabetes mellitus ([place])     type ii diet controlled    Dysrhythmias     a fib    Hemorrhage of gastrointestinal tract 5/16    Hemorrhagic shock     Hepatic cirrhosis ([place])     Hydrocele     Hypertension 07/10/2014    Marfan syndrome     Muscle weakness     Generalized    Obesity     Syncope     Thyroid disease     hypothyroid    Transfusion history     s/p Multiple units PRC/FFP this adm.    Valvular disease     St Judes aortic valve replacement-chronic anti-coagulant therapy    Wears glasses          Past Surgical History:   Procedure Laterality Date    COLONOSCOPY      HX COLONOSCOPY      HX HEART CATHETERIZATION      HX HEART VALVE SURGERY  1999    st judes     HX HEMICOLECTOMY Right 07/13/2014    Laparoscopic right hemicolectomy for colon cancer    HX TONSIL AND ADENOIDECTOMY      HX TONSILLECTOMY      HX UPPER ENDOSCOPY  07/09/14    HX WISDOM TEETH EXTRACTION           Family Medical History     Problem Relation (Age of Onset)    Cirrhosis Brother    Hypertension Mother    Lung Cancer Father            Social History     Social History    Marital status: Married     Spouse name: N/A    Number of children: N/A    Years of education: N/A     Social History Main Topics    Smoking status: Never Smoker    Smokeless tobacco: Never Used    Alcohol use No       Drug use: No    Sexual activity: Not on file     Other Topics Concern    Ability To Walk 1 Flight Of Steps Without Sob/Cp Yes    Routine Exercise No    Ability To Walk 2 Flight Of Steps Without Sob/Cp Yes    Unable To  Ambulate No    Total Care No    Ability To Do Own Adl's Yes    Uses Walker No    Other Activity Level No    Uses Cane No     Social History Narrative     Married, lives with wife,. Has 4 children. One daughter has Marfan's syndrome. 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 03/05/2016 0933    Heart Rate 92 filed at... 03/05/2016 0933    Respiratory Rate 20 filed at... 03/05/2016 0933    BP (Non-Invasive) (!)  150/88 filed at... 03/05/2016 0933    Height 1.905 m (6\' 3" ) filed at... 03/05/2016 0933    Weight 131.9 kg (290 lb 12.6 oz) filed at... 03/05/2016 0933    BMI (Calculated) 36.42 filed at... 03/05/2016 0933    BSA (Calculated) 2.64 filed at... 03/05/2016 0933      General appearance: Awake, alert and in no acute distress.  HEENT: Normocephalic, atraumatic, EOMI, sclera anicteric, oropharynx moist and pink, neck supple, trachea midline .  Respiratory: Clear to ausculatation bilaterally,no wheezes,crackles, rhonchi or rales.  Cardiovascular: RRR, no murmurs, rubs, gallops, no JVD, 2+ radial pulses bilaterally.  Gastrointestinal: Soft nontender, nondistended, no abdominal masses noted.  A hernia at the superior aspect of his incision. It is reducible. Incision has healed well.  Lymphatics: No palpable lymphadenopathy.  Musculoskeletal: 5/5 strength in the upper and and 5/5 in lower extremities.  Skin: Non jaundiced, no rashes.  Neurologic: Cranial nerves II-XII grossly intact. Non-focal  Psychiatric: Normal affect, good insight.    LABS from today  Results for orders placed or performed in visit on 03/05/16 (from the past 72  hour(s))   POC BLOOD GLUCOSE (RESULTS)   Result Value Ref Range    GLUCOSE, POC 405 (HH) 70 - 105 mg/dL    Narrative    RN Notified   Results for orders placed or performed during the hospital encounter of 03/05/16 (from the past 72 hour(s))   BLOOD CELL COUNT W/DIFF - CANCER CENTER    Narrative    The following orders were created for panel order BLOOD CELL COUNT W/DIFF - CANCER CENTER.  Procedure                               Abnormality         Status                     ---------                               -----------         ------                     [facility].Marland KitchenMarland KitchenYS:[id number]  Normal              Final result               CBC WITH WE:[id number]                Abnormal            Final result  Please view results for these tests on the individual orders.   CARCINOEMBRYONIC ANTIGEN   Result Value Ref Range    CEA 3.8 <=5.0 ng/mL   COMPREHENSIVE METABOLIC PANEL, NON-FASTING   Result Value Ref Range    SODIUM 131 (L) 136 - 145 mmol/L    POTASSIUM 4.9 3.5 - 5.1 mmol/L    CHLORIDE 97 96 - 111 mmol/L    CO2 TOTAL 28 22 - 32 mmol/L    ANION GAP 6 4 - 13 mmol/L    BUN 18 8 - 25 mg/dL    CREATININE 1.48 (H) 0.62 - 1.27 mg/dL    BUN/CREA RATIO 12 6 - 22    ESTIMATED GFR 50 (L) >59 mL/min/1.23m2    ALBUMIN 3.4 3.4 - 4.8 g/dL    CALCIUM 9.1 8.5 - 10.2 mg/dL    GLUCOSE 518 (HH) 65 - 139 mg/dL    ALKALINE PHOSPHATASE 104 <150 U/L    ALT (SGPT) 21 <55 U/L    AST (SGOT) 18 8 - 48 U/L    BILIRUBIN TOTAL 1.2 0.3 - 1.3 mg/dL    PROTEIN TOTAL 7.4 6.0 - 8.0 g/dL   PT/INR   Result Value Ref Range    PROTHROMBIN TIME 26.5 (H) 9.3 - 13.9 seconds    INR 2.30 (H) 0.80 - 1.20    Narrative    Coumadin therapy INR range for Conventional Anticoagulation is 2.0 to 3.0 and for Intensive Anticoagulation 2.5 to 3.5.   [facility]   Result Value Ref Range    PMN ABS (AUTO) 5.19 1.50 - 7.70 x103/uL    PLATELET COUNT (AUTO) 143 140 - 450 x103/uL   CBC WITH DIFF   Result Value Ref Range     WBC 6.7 3.5 - 11.0 x103/uL    RBC 5.05 4.06 - 5.63 x106/uL    HGB 16.1 12.5 - 16.3 g/dL    HCT 47.3 (H) 36.7 - 47.0 %    MCV 93.6 78.0 - 100.0 fL    MCH 31.9 27.4 - 33.0 pg    MCHC 34.1 32.5 - 35.8 g/dL    RDW 13.3 12.0 - 15.0 %    PLATELETS 143 140 - 450 x103/uL    MPV 10.1 7.5 - 11.5 fL    NEUTROPHIL % 78 %    LYMPHOCYTE % 16 %    MONOCYTE % 5 %    EOSINOPHIL % 1 %    BASOPHIL % 0 %    NEUTROPHIL # 5.19 1.50 - 7.70 x103/uL    LYMPHOCYTE # 1.10 1.00 - 4.80 x103/uL    MONOCYTE # 0.35 0.30 - 1.00 x103/uL    EOSINOPHIL # 0.04 0.00 - 0.50 x103/uL    BASOPHIL # 0.03 0.00 - 0.20 x103/uL   HGA1C (HEMOGLOBIN A1C WITH EST AVG GLUCOSE)   Result Value Ref Range    HEMOGLOBIN A1C 9.3 (H) 4.0 - 6.0 %    ESTIMATED AVERAGE GLUCOSE 220 mg/dL    Narrative    ADA 2009 recommendations suggest lowering A1C to below or around 7% to reduce microvascular and neuropathic complications of type 1 and type 2 diabetes.  There are no age/gender specific ranges for eAG.            PROCEDURE:  COLONOSCOPY 01/23/2015 FINDINGS: 1. COLON FINDINGS: A pedunculated polyp measuring 10 mm in size was  found in the sigmoid colon. A polypectomy was performed using snare cautery.  The resection was complete, the polyp tissue was completely  retrieved and sent  to histology.  2. Mild diverticulosis was noted in the sigmoid colon.  3. There was evidence of a normal appearing prior surgical anastomosis in the  ascending colon. Retroflexed views in the rectum revealed internal  hemorrhoids.   The scope was then withdrawn from the patient and the  procedure terminated. Vital signs were monitored constantly through out the  Procedure.      PATHOLOGY REVIEW  Final Pathologic Diagnosis    RIGHT COLON, HEMICOLECTOMY:    - Moderately differentiated adenocarcinoma. (See Cancer Case Summary.)    - Resection margins are free of neoplasm.    - Thirty lymph nodes identified, all negative for malignancy (0/30).    - Pathologic staging:  pT2 N0.    - Appendix with no diagnostic abnormalities recognized.          [facility] CANCER CASE SUMMARY: COLON    Specimen: Terminal ileum, cecum, appendix and ascending colon.  Procedure: Right hemicolectomy.  Tumor site: Right ascending colon.  Tumor size: Greatest dimension: 3.5 cm.  Macroscopic tumor perforation: Not identified.  Histologic type: Adenocarcinoma.  Histologic grade: Low-grade (well-differentiated to moderately differentiated).  Microscopic tumor extension: Tumor invades muscularis propria.  Margins: All margins uninvolved by invasive carcinoma.    Distance of invasive carcinoma from closest margin: 5.5 cm from proximal  margin.  Lymph-vascular invasion: Not identified.  Perineural invasion: Not identified.  Tumor deposits: Not identified.  Pathologic staging: pT2, pN0.    Number of lymph nodes examined: 23.    Number of lymph nodes involved: 0.  Additional pathologic findings: None identified.        IMAGING  1) CT 07/11/2014  IMPRESSION:   1.Small bilateral pleural effusions. 2 pulmonary nodules, largest measuring 4 mm. Followup with CT chest in 6-12 months is recommended.  2. Cholelithiasis with mild gallbladder wall thickening. If there is concern for acute cholecystitis further evaluation with ultrasound is recommended.  3. Patient's known cecal mass is not visualized. No evidence of obstruction. Diverticulosis without evidence for complicated disease.  4. Mild nonspecific mesenteric stranding and minimal free fluid within the pelvis.    2) CT CHEST ABDOMEN PELVIS W IV CONTRAST 01/24/2015    CHEST:  HEART/MEDIASTINUM/ESOPHAGUS: Heart size is normal. There is no pericardial effusion. There are mild coronary calcifications. Diameter of the aorta and pulmonary trunk is normal. Multiple subcentimeter mediastinal and right hilar lymph nodes are seen.     LUNGS/PLEURA: 3 mm nodular region on series 10 image 42 likely represents a vessel en face.  There is a 2 mm nodule right upper lobe on series 10 image 54. Additional 2 mm nodule right upper lobe on series 10 image 71 identified and likely calcified granuloma is seen on series 4 image 69 within the right middle lobe measuring 4 mm. There are multiple additional 2 mm nodules within the right middle and lower lobes as well as few tiny calcified nodules likely representing granulomas. Additionally, there are multiple approximately 2 mm nodules scattered throughout the left lung and tiny calcified granulomas. There is no pneumothorax or pleural effusion.    OTHER: Prominent right cervical nodes are noted, maximum dimension measuring 1.4 cm. The thyroid is unremarkable. Sclerotic lesion within the distal right clavicle on series 3 image 14 likely represents a bone island.    ABDOMEN/PELVIS:  LIVER: There is a nonspecific 4 mm linear hypoattenuation within the left hepatic lobe seen on series 5 image 12 which likely represents a bile duct. The liver size is normal.  There is a mildly dysmorphic liver with no overt signs of cirrhosis.    BILIARY SYSTEM/GALLBLADDER: There is no intrahepatic or extrahepatic bile duct dilation. Cholelithiasis is identified, with no evidence of gallbladder distention or pericholecystic fluid.    PANCREAS: There is mild fatty replacement of the pancreas. No peripancreatic stranding or pancreatic duct dilatation is identified.    STOMACH: The stomach is incompletely distended, limiting evaluation for wall thickening.    SPLEEN: The spleen is of normal size and enhances homogenously.    ADRENALS: No adrenal nodules are seen.    KIDNEYS/URETER/BLADDER: No renal masses or renal calculi are visualized. There is no hydronephrosis. The bilateral ureters are of normal caliber. Bladder is incompletely distended, limiting evaluation for wall thickening.    REPRODUCTIVE ORGANS: The prostate is of normal size and the seminal vesicles are symmetric.    BOWEL: Postsurgical changes from  right hemicolectomy are identified. Anastomotic site within the right abdomen is seen , with evidence of few subcentimeter shotty mesenteric lymph nodes in this region as well as any nodular density on series 14 image 40 which could represent lymph nodes or postsurgical scarring. There is no evidence of bowel obstruction. Evaluation for bowel wall thickening is limited due to incomplete distention. There is evidence of uncomplicated sigmoid diverticulosis.    VASCULATURE/LYMPH NODES: The aorta demonstrates mild atherosclerotic disease. The celiac, superior mesenteric, bilateral renal arteries and inferior mesenteric artery are patent. The portal vein and the SMV are unremarkable. There are multiple subcentimeter celiac axis and periportal lymph nodes as well as multiple subcentimeter periaortic lymph nodes. There are upper limits of normal in size bilateral inguinal lymph nodes, the largest is in the right inguinal canal measuring 1.4 x 1.2 cm which appears to contain a fatty hilum and is likely benign.    PERITONEUM / RETROPERITONEUM: No free air or fluid is identified within the abdomen or pelvis.    BONES/SOFT TISSUES: There is a sclerotic rounded lesion within the T5 vertebral body, which likely represents a bone island. There is a left inguinal hernia which contains a segment of the sigmoid colon. There is no bowel obstruction. Diffuse degenerative changes are seen in the spine. No suspicious osseous lesions are visualized. There is a tiny fat containing umbilical hernia.  IMPRESSION:   1. Postsurgical changes from right hemicolectomy for colonic adenocarcinoma with nodular density adjacent to the anastomotic site which could represent shotty lymph nodes or scarring. Close attention on followup is recommended.  2. Left inguinal hernia containing a segment of sigmoid colon with no evidence of bowel obstruction.  3. Multiple tiny pulmonary nodules. Chest CT is recommended in 6 months for followup.   4.  Prominent right cervical lymph nodes, which are nonspecific and could be reactive. Clinical correlation is recommended.   3) [place] RT UPPER QUADRANT 12/15 2016   FINDINGS:     The pancreas is not seen on today's study    The size of the liver is within normal limits, measuring 17.0 cm along the longitudinal axis of the right hepatic lobe. Hepatic echogenicity is within normal limits with no evidence of focal lesions or fatty change.   Doppler demonstrates the portal vein and hepatic veins to be patent with appropriate direction of flow. There is no evidence of intra-/extrahepatic biliary duct dilation. The common bile duct measures 2.7 mm.    The gallbladder is contracted as the patient was not NPO prior to the study. Intraluminal gallstones are identified with corresponding posterior shadowing. No adjacent fluid  is seen to suggest acute cholecystitis.    The right kidney measures 11.0 x 7.1 cm. There is no evidence of hydronephrosis.    IMPRESSION:  1. Unremarkable ultrasound evaluation of the liver with no evidence to suggest cirrhotic changes.  2. The gallbladder is contracted as the patient was not n.p.o. prior to the study. Intraluminal gallstones are seen with no evidence of acute cholecystitis. Gallstones were present on the prior CT study.    ASSESSMENT AND PLAN  A 66 yr old male with Hx of Marfans, mechanical aortic valve (1999) (on anticoaggulation), aFib (s/p cardioversion), DM, hypothyroidism who presented to [facility] w/ bloody diarrhea, supratherapeutic INR and hypotension.Colonoscopy remarkable for large mass along the 1st fold of the ascending colon concerning for malignancy. S/P laproscopic right hemicolectomy 06/15/2014. Pathology revealed 3.5 cm well differentiated to moderately differentiated adenocarcinoma, resection margins free, 0/30 nodes, LVI neg, perineural invasion neg. PT2N0, Stage 1  The vast majority of studies exploring the benefits of posttreatment surveillance have been conducted in  patients with resected stage II or III disease. Whether or not intensive posttreatment surveillance is needed after resection of a stage I colon cancer is controversial, and the guidelines differ on this point. I recommended periodic history and physical examination, CEA testing, CT scanning and endoscopic surveillance, extrapolating from stage II and III data.    Stage 1 Colon Cancer  -On active surveillance. No signs /symptoms of recurrence. Pt is doing well clinically.   -Checked labs today including CEA WNL.  -On colonoscopy 01/23/2015 a pedunculated polyp measuring 10 mm in size was found in the sigmoid colon and polypectomy was performed. Pathology revealed Tubulovillous adenoma. Colonoscopy at 1 year WNL, repeating in 3 years.  -Ct Scan 01/24/2016 neg for disease recurrence. Repeat CT C/A/P scan in June 2017 to follow-up on multiple tiny pulm nodules and investigate hernia. CT CAP rescheduled from today due to AKI. Will repeat at next visit.  -Diet, Exercise encouraged, Vit D recommended  -Follow up every 3 months for 1st 2 years and then every 6 months during years 3, 4 and 5.     Uncontrolled DM type 2  -Currently controlled by diet only, previously on Metformin  -A1C ordered  -IVF 1L bolus given  -10 Units regular insulin SC x 1 then recheck BG  -SSI pre-meal and bedtime prescribed. Patient has glucometer and strips.   -Advised to follow up with PCP asap    Incisional Hernia  -Sees Dr. Catarina Hartshorn today for consideration of hernia repair.    Cirrhosis of Liver  -LFTs WNL  -[place] RUQ 01/24/2015 showed no evidence to suggest cirrhosis. Previous US Liver in 06/2014 reported cirrhosis of liver. He has no clinical stigmata of cirrhosis  -Will assess liver on next CT CAP.   -Will have him follow with GI hepatology for cirrhosis if needed    Mechanical aortic valve (1999)   -on anticoagulation, coumadin managed by PCP. Last one 2.7    RTC in 3 months.     Case discussed and seen with attending physician.     Onnie Graham MD  PGY-VI Hematology/Oncology Fellow  [facility]      03/06/2015  I saw and examined the patient.  I reviewed the fellow's note.  I agree with the findings and plan of care as documented in the fellow's note.  Any exceptions/additions are edited/noted.  My interpretation of CT completed 01/24/2016 showed no evidence of local/distant recurrence. CEA WNL. Blood sugar 518. Pt given  10 U scq insulin and IL IVFs. Labs re-checked. BS 314, Given another 10 U of insulin and BS came down to 179. He was given teaching on how to use insulin by pharmacist and sent home on sliding scale insulin. He will follow-up with his PCP either tomorrow or Monday next week.    Ardeth Sportsman, MD

## 2016-03-05 NOTE — Progress Notes (Signed)
In clinic, patient received 1 L NS + 150 mL/hr, as well as insulin lispro 10 units x 2 doses.    For hyperglycemia, patient was educated to use the following insulin lispro sliding scale before meals and bedtime:  If 151-200 -> 3 units  If 201-250 -> 6 units  If >250 -> 9 units  If >400 call MD    Patient will follow up with PCP to address diabetes control either tomorrow or Monday.    Belenda Cruise, PHARMD  Pager: [phone]

## 2016-03-06 ENCOUNTER — Ambulatory Visit: Payer: Medicare Other | Attending: INTERNAL MEDICINE

## 2016-03-06 ENCOUNTER — Ambulatory Visit (INDEPENDENT_AMBULATORY_CARE_PROVIDER_SITE_OTHER): Payer: Medicare Other | Admitting: INTERNAL MEDICINE

## 2016-03-06 ENCOUNTER — Encounter (INDEPENDENT_AMBULATORY_CARE_PROVIDER_SITE_OTHER): Payer: Self-pay | Admitting: INTERNAL MEDICINE

## 2016-03-06 VITALS — BP 158/84 | HR 104 | Temp 98.0°F | Ht 75.0 in | Wt 295.0 lb

## 2016-03-06 DIAGNOSIS — R739 Hyperglycemia, unspecified: Principal | ICD-10-CM

## 2016-03-06 LAB — HGA1C (HEMOGLOBIN A1C WITH EST AVG GLUCOSE)
ESTIMATED AVERAGE GLUCOSE: 220 mg/dL — ABNORMAL HIGH (ref 70–110)
GLYCOHEMOGLOBIN (HBA1C): 9.3 % — ABNORMAL HIGH (ref 4.0–6.0)
GLYCOHEMOGLOBIN (HBA1C): 9.3 % — ABNORMAL HIGH (ref 4.0–6.0)

## 2016-03-06 NOTE — Nursing Note (Signed)
BP (!) 158/84  Pulse (!) 104  Temp 36.7 C (98 F)  Ht 1.905 m (6\' 3" )  Wt 133.8 kg (295 lb)  SpO2 98%  BMI 36.87 kg/m2  Karolee Ohs, CMA  03/06/2016, 08:47\

## 2016-03-06 NOTE — Progress Notes (Signed)
[facility]   [address]   [place], [place] [address]  [phone]  [phone]    Acute Office Visit     SUBJECTIVE   Kenneth Weeks is a 66 y.o. male here for follow-up on anticoagulation; colon cancer surgery; and now a high blood sugar from [facility].     I see from the EMR that his sugar was 341 yesterday in the AM.   He was given insulin before being sent home later in the day. DC sugar 179     I also see some finger sticks of 383, 267     He is here to talk about what to do next.     He was given some insulin and given a sliding scale to use.       Medications reviewed by me personally:   Warfarin 7.5mg  daily in the AM   Synthroid 273mcg daily in the AM   Lasix 40 mg BID   Spironolactone 50 mg daily in the AM  Potassium daily -- OTC supplement   Warfarin 10 mg daily   Magnesium tablet   Vitamin D3   Calcium OTC       Medications reviewed    Current Outpatient Prescriptions:     cholecalciferol, vitamin D3, 1,000 unit Oral Tablet, Take 1,000 Units by mouth Once a day, Disp: , Rfl:     docusate sodium (COLACE) 100 mg Oral Capsule, Take 1 Cap (100 mg total) by mouth Twice daily, Disp: , Rfl: 0    furosemide (LASIX) 40 mg Oral Tablet, Take 1 Tab (40 mg total) by mouth Twice daily, Disp: 180 Tab, Rfl: 3    insulin lispro (HUMALOG KWIKPEN) 100 unit/mL Subcutaneous Insulin Pen, Check prior to meals and bedtime If 151-200  -- 3 units // 201-250  -- 6 units //251-400  -- 9 units // Call MD if glucose < 70 OR >400, Disp: 15 mL, Rfl: 0    Insulin Syringe-Needle U-100 (BD INSULIN SYRINGE UF) 0.3 mL 30 gauge x 1/2" Syringe, Use prior to meals and at bedtime, Disp: 20 Syringe, Rfl: 0    levothyroxine (SYNTHROID) 200 mcg Oral Tablet, TAKE 1 TABLET BY MOUTH ONCE DAILY, Disp: 90 Tab, Rfl: 3    MAGNESIUM CARBONATE ORAL, Take by mouth Once a day, Disp: , Rfl:     POTASSIUM GLUCONATE ORAL, Take by mouth Once a day, Disp: , Rfl:     spironolactone (ALDACTONE) 50 mg Oral Tablet, TAKE ONE  TABLET BY MOUTH ONE TIME DAILY, Disp: 90 Tab, Rfl: 3    warfarin (COUMADIN) 5 mg Oral Tablet, Take 1.5 Tabs (7.5 mg total) by mouth Once a day, Disp: 140 Tab, Rfl: 4  No current facility-administered medications for this visit.     Facility-Administered Medications Ordered in Other Visits:     iopamidol (ISOVUE-370) 76% infusion, 100 mL, Intravenous, Give in Radiology, Lars Masson, MD      Review of Systems -     Constitutional: negative for fevers  Eyes: denies blurry vision   ENT: no acute hearing loss   Cardiovascular: no new chest pain  Respiratory: no new shortness of breath   GI: no constipation   GU: no difficulty urinating   Musculoskeletal: heel pain  Neurological: no new seizure or headaches   Emotional/Pscychiatric: no depression or anxiety  Skin: stasis changes in BL legs   Endocrine: no new diabetes   Hematologic/Lymphatic: no DVT or bleeding issues      History   Smoking  Status    Never Smoker   Smokeless Tobacco    Never Used       OBJECTIVE:  BP (!) 158/84   Pulse (!) 104   Temp 36.7 C (98 F)   Ht 1.905 m (6\' 3" )   Wt 133.8 kg (295 lb)   SpO2 98%   BMI 36.87 kg/m2    Counseling done about the following face-to-face:     Labs reviewed by me   Drug-drug interactions considered and reviewed by me     Physical Exam     General -- alert     Repeat BP taken by me in the right arm -- 138/80   HEENT -- PERRL, EOMI   Heart --mechanical click   Neck -- Thyroid is normal sized  Lungs -- clear and no wheezing; no pleurisy   Abdomen -- soft and tender  Neuro -- normal gait and tone; strength is 5/5  Skin -- no rashes noted   Psych -- pleasant and smiling   Vascular -- legs are not swollen; smooth, shiny skin        Labs -- reviewed by me in the EMR   December 2017    Colonic biopsy -- hyperplastic polyp only     September 2017    Creatinine 1.21    Hgb 15.18 August 2015    Hgb 15.7   Creatinine 1.14   CT scan of chest -- minimal lung nodules, not changed; not big     March 2017    INR 2.17 January 2015     Dr. Burke Keels note: no evidence of any metastatic disease    Dr. Dianah Field note: pT2N0 stage I colon cancer    Hgb 11.8    Creatinine 1.04    Glucose 99   LFT's okay   CEA 1.5   CT chest/abdomen/pelvis -- no evidence of recurrent disease; some small lung nodules     June 2016   [facility] Note:     Follow up colonoscopy in December 2016     CT scan of chest/abdomen/pelvis in summer 2017     May 2016   Creatinine 1.27   Glucose 136 (not fasting)    LFT's okay     March 2016    Creatinine 1.02   TSH 1.23   LFT's okay     Medications -- reviewed by me; drug-drug interactions considered by me        ASSESSMENT AND PLAN:  Hyperglycemia -- check A1c today and start checking glucometer       DISPOSITION:   Check A1c now   Rx glucometer -- check sugars BID: before breakfast and before dinner       Chronical sugars and likely start anti-DM medications at visit next week   Visit with me next week

## 2016-03-19 ENCOUNTER — Ambulatory Visit (INDEPENDENT_AMBULATORY_CARE_PROVIDER_SITE_OTHER): Payer: Self-pay | Admitting: INTERNAL MEDICINE

## 2016-03-19 ENCOUNTER — Ambulatory Visit: Payer: Medicare Other | Attending: INTERNAL MEDICINE

## 2016-03-19 DIAGNOSIS — Z5181 Encounter for therapeutic drug level monitoring: Secondary | ICD-10-CM | POA: Insufficient documentation

## 2016-03-19 DIAGNOSIS — Z7901 Long term (current) use of anticoagulants: Secondary | ICD-10-CM | POA: Insufficient documentation

## 2016-03-19 LAB — PT/INR
INR: 2.15
PROTHROMBIN TIME: 23.6 s — ABNORMAL HIGH (ref 9.4–12.5)

## 2016-03-19 NOTE — Progress Notes (Signed)
03/19/16 1300   [facility] PT/INR   Date INR drawn 03/19/16   INR 2.2   New warfarin dosage Just a bit low   New weekly dosgae Take a double dose today and then resume normal   Initials RTM   Anticoag Indication Valve   Goal INR 2.5-3.5

## 2016-03-19 NOTE — Progress Notes (Signed)
Pt notified and lab appt scheduled.  Pt will double up dose today and then resume 1.5 tabs daily tomorrow.

## 2016-03-20 ENCOUNTER — Ambulatory Visit (INDEPENDENT_AMBULATORY_CARE_PROVIDER_SITE_OTHER): Payer: Medicare Other | Admitting: INTERNAL MEDICINE

## 2016-03-20 ENCOUNTER — Encounter (INDEPENDENT_AMBULATORY_CARE_PROVIDER_SITE_OTHER): Payer: Self-pay | Admitting: INTERNAL MEDICINE

## 2016-03-20 ENCOUNTER — Encounter (INDEPENDENT_AMBULATORY_CARE_PROVIDER_SITE_OTHER): Payer: Medicare Other | Admitting: INTERNAL MEDICINE

## 2016-03-20 VITALS — BP 132/80 | HR 96 | Temp 98.0°F | Ht 75.0 in | Wt 292.0 lb

## 2016-03-20 DIAGNOSIS — R739 Hyperglycemia, unspecified: Secondary | ICD-10-CM

## 2016-03-20 DIAGNOSIS — E119 Type 2 diabetes mellitus without complications: Secondary | ICD-10-CM

## 2016-03-20 NOTE — Nursing Note (Signed)
BP 132/80  Pulse 96  Temp 36.7 C (98 F)  Ht 1.905 m (6\' 3" )  Wt 132.5 kg (292 lb)  SpO2 99%  BMI 36.5 kg/m2  Karolee Ohs, CMA  03/20/2016, 09:54

## 2016-03-20 NOTE — Progress Notes (Signed)
[facility]   [address]   [place], [place] [address]  [phone]  [phone]    Acute Office Visit     SUBJECTIVE   Kenneth Weeks is a 66 y.o. male here for follow-up on anticoagulation; colon cancer surgery; and now a diagnosis of diabetes.     He comes in today with sugars for me to review:     228, 283, 279, 248, 249, 170, 194, 224, 216, 138, 145, 139, 142, 156, 151, 121, 173, 159, 139, 124, 167, 148, 131, 106, 131, 129, 134, 119, 134, 119, 110, 175, 141, 143, 165, 101, 115, 130, 109, 149, 109, 118, 133, 118, 96, 121, 141, 131, 109, 115, 134, 122, 150, 90, 122, 138, 122, 96, [place] brought these sugars in and says that this "is all diet."      Dietary changes -- more salads (not kale, not spinach)     Medications reviewed by me personally:   Warfarin 7.5mg  daily in the AM   Synthroid 237mcg daily in the AM   Lasix 40 mg BID   Spironolactone 50 mg daily in the AM  Potassium daily -- OTC supplement   Warfarin 10 mg daily   Magnesium tablet   Vitamin D3   Calcium OTC       Medications reviewed    Current Outpatient Prescriptions:   .  cholecalciferol, vitamin D3, 1,000 unit Oral Tablet, Take 1,000 Units by mouth Once a day, Disp: , Rfl:   .  docusate sodium (COLACE) 100 mg Oral Capsule, Take 1 Cap (100 mg total) by mouth Twice daily, Disp: , Rfl: 0  .  furosemide (LASIX) 40 mg Oral Tablet, Take 1 Tab (40 mg total) by mouth Twice daily, Disp: 180 Tab, Rfl: 3  .  insulin lispro (HUMALOG KWIKPEN) 100 unit/mL Subcutaneous Insulin Pen, Check prior to meals and bedtime If 151-200  -- 3 units // 201-250  -- 6 units //251-400  -- 9 units // Call MD if glucose < 70 OR >400, Disp: 15 mL, Rfl: 0  .  Insulin Syringe-Needle U-100 (BD INSULIN SYRINGE UF) 0.3 mL 30 gauge x 1/2" Syringe, Use prior to meals and at bedtime, Disp: 20 Syringe, Rfl: 0  .  levothyroxine (SYNTHROID) 200 mcg Oral Tablet, TAKE 1 TABLET BY MOUTH ONCE DAILY, Disp: 90 Tab, Rfl: 3  .  MAGNESIUM CARBONATE ORAL, Take by  mouth Once a day, Disp: , Rfl:   .  POTASSIUM GLUCONATE ORAL, Take by mouth Once a day, Disp: , Rfl:   .  spironolactone (ALDACTONE) 50 mg Oral Tablet, TAKE ONE TABLET BY MOUTH ONE TIME DAILY, Disp: 90 Tab, Rfl: 3  .  warfarin (COUMADIN) 5 mg Oral Tablet, Take 1.5 Tabs (7.5 mg total) by mouth Once a day, Disp: 140 Tab, Rfl: 4  No current facility-administered medications for this visit.     Facility-Administered Medications Ordered in Other Visits:   .  iopamidol (ISOVUE-370) 76% infusion, 100 mL, Intravenous, Give in Radiology, Lars Masson, MD      Review of Systems -     Constitutional: negative for fevers  Eyes: denies blurry vision or any other occular symptoms   ENT: no acute hearing loss   Cardiovascular: no new chest pain  Respiratory: no new shortness of breath   GI: no constipation   GU: no difficulty urinating   Musculoskeletal: heel pain  Neurological: no new seizure or headaches   Emotional/Pscychiatric: no depression or anxiety  Skin: stasis changes in BL legs   Endocrine: new diabetes -- sugars are above   Hematologic/Lymphatic: no DVT or bleeding issues      History   Smoking Status   . Never Smoker   Smokeless Tobacco   . Never Used       OBJECTIVE:  BP 132/80  Pulse 96  Temp 36.7 C (98 F)  Ht 1.905 m (6\' 3" )  Wt 132.5 kg (292 lb)  SpO2 99%  BMI 36.5 kg/m2    Counseling done about the following face-to-face:     Labs reviewed by me   Drug-drug interactions considered and reviewed by me     Physical Exam     All blood sugars reviewed together and discussion for the whole visit: 15 minutes     Labs -- reviewed by me in the EMR   January 2018    A1c 9.3%     December 2017    Colonic biopsy -- hyperplastic polyp only     September 2017    Creatinine 1.21    Hgb 15.18 August 2015    Hgb 15.7   Creatinine 1.14   CT scan of chest -- minimal lung nodules, not changed; not big     March 2017    INR 2.17 January 2015    Dr. Burke Keels note: no evidence of any metastatic disease    Dr. Dianah Field  note: pT2N0 stage I colon cancer    Hgb 11.8    Creatinine 1.04    Glucose 99   LFT's okay   CEA 1.5   CT chest/abdomen/pelvis -- no evidence of recurrent disease; some small lung nodules     June 2016   [facility] Note:     Follow up colonoscopy in December 2016     CT scan of chest/abdomen/pelvis in summer 2017     May 2016   Creatinine 1.27   Glucose 136 (not fasting)    LFT's okay     March 2016    Creatinine 1.02   TSH 1.23   LFT's okay     Medications -- reviewed by me; drug-drug interactions considered by me        ASSESSMENT AND PLAN:  Hyperglycemia -- Kenneth Weeks has done a tremendous amount of changes in his diet and his sugars are excellent without any medication.   Keep checking sugars -- once per day: AM one day, PM the next   No medication at this time!     Follow up with me in 4-6 weeks   He has test strips and supplies

## 2016-04-15 ENCOUNTER — Ambulatory Visit: Payer: Medicare Other | Attending: INTERNAL MEDICINE

## 2016-04-15 DIAGNOSIS — Z7901 Long term (current) use of anticoagulants: Principal | ICD-10-CM | POA: Insufficient documentation

## 2016-04-15 LAB — PT/INR
INR: 2.22
PROTHROMBIN TIME: 24.4 s — ABNORMAL HIGH (ref 9.4–12.5)

## 2016-04-16 ENCOUNTER — Ambulatory Visit (INDEPENDENT_AMBULATORY_CARE_PROVIDER_SITE_OTHER): Payer: Self-pay | Admitting: INTERNAL MEDICINE

## 2016-04-16 ENCOUNTER — Other Ambulatory Visit (INDEPENDENT_AMBULATORY_CARE_PROVIDER_SITE_OTHER): Payer: Self-pay | Admitting: INTERNAL MEDICINE

## 2016-04-16 NOTE — Progress Notes (Signed)
Spoke to patient made him a month f/u and he is aware how to take his med

## 2016-04-16 NOTE — Progress Notes (Signed)
04/16/16 0800   [facility] PT/INR   Date INR drawn 04/16/16   INR 2.2   New warfarin dosage INR is close to goal   New weekly dosgae Take a double dose today and then resume normal   Comments Check INR in one month   Initials RMc   Anticoag Indication Valve   Goal INR 2.5-3.5

## 2016-04-17 ENCOUNTER — Ambulatory Visit (INDEPENDENT_AMBULATORY_CARE_PROVIDER_SITE_OTHER): Payer: Medicare Other | Admitting: INTERNAL MEDICINE

## 2016-04-17 ENCOUNTER — Encounter (INDEPENDENT_AMBULATORY_CARE_PROVIDER_SITE_OTHER): Payer: Self-pay | Admitting: INTERNAL MEDICINE

## 2016-04-17 VITALS — BP 140/90 | HR 98 | Temp 97.5°F | Ht 75.5 in | Wt 289.6 lb

## 2016-04-17 DIAGNOSIS — E119 Type 2 diabetes mellitus without complications: Secondary | ICD-10-CM

## 2016-04-17 NOTE — Nursing Note (Signed)
BP 140/90   Pulse 98   Temp 36.4 C (97.5 F)   Ht 1.918 m (6' 3.5")   Wt 131.4 kg (289 lb 9.6 oz)   SpO2 98%   BMI 35.72 kg/m2  Charlyne Petrin, LPN  0/04/1279, 18:86

## 2016-04-17 NOTE — Progress Notes (Signed)
[facility]   [address]   [place], [place] [address]  [phone]  [phone]    Acute Office Visit     SUBJECTIVE   Kenneth Weeks is a 66 y.o. male here for follow-up on anticoagulation; colon cancer surgery; and now a diagnosis of diabetes.     I last saw Kenneth Weeks on February 9th.     He comes in today with sugars for me to review:   113, 87, 131, 112, 111, 94, 112, 91, 97, 119, 102, 130, 89, 129, 107, 136, 101, 127, 103, 122, 96, 125, 102, 122, 103, 128, 113, 128, 99, 124, 105, 131, 111, 133, 94, 133, 122, 119, 97, 129, 121, 105, 124, 118, 99, 125, 107, 132       Dietary changes -- definitely cut back when he eats out: more salads at fast food (as opposed to a sandwhich with a bun; onion rings with barbecue sauce)       Medications reviewed by me personally:   Warfarin 7.5mg  daily in the AM   Synthroid 275mcg daily in the AM   Lasix 40 mg BID   Spironolactone 50 mg daily in the AM  Potassium daily -- OTC supplement   Warfarin 10 mg daily   Magnesium tablet   Vitamin D3   Calcium OTC       Medications reviewed    Current Outpatient Prescriptions:     cholecalciferol, vitamin D3, 1,000 unit Oral Tablet, Take 1,000 Units by mouth Once a day, Disp: , Rfl:     docusate sodium (COLACE) 100 mg Oral Capsule, Take 1 Cap (100 mg total) by mouth Twice daily, Disp: , Rfl: 0    furosemide (LASIX) 40 mg Oral Tablet, TAKE 1 TAB (40 MG TOTAL) BY MOUTH TWICE DAILY, Disp: 180 Tab, Rfl: 3    insulin lispro (HUMALOG KWIKPEN) 100 unit/mL Subcutaneous Insulin Pen, Check prior to meals and bedtime If 151-200  -- 3 units // 201-250  -- 6 units //251-400  -- 9 units // Call MD if glucose < 70 OR >400, Disp: 15 mL, Rfl: 0    Insulin Syringe-Needle U-100 (BD INSULIN SYRINGE UF) 0.3 mL 30 gauge x 1/2" Syringe, Use prior to meals and at bedtime, Disp: 20 Syringe, Rfl: 0    levothyroxine (SYNTHROID) 200 mcg Oral Tablet, TAKE 1 TABLET BY MOUTH ONCE DAILY, Disp: 90 Tab, Rfl: 3    MAGNESIUM CARBONATE ORAL,  Take by mouth Once a day, Disp: , Rfl:     POTASSIUM GLUCONATE ORAL, Take by mouth Once a day, Disp: , Rfl:     spironolactone (ALDACTONE) 50 mg Oral Tablet, TAKE ONE TABLET BY MOUTH ONE TIME DAILY, Disp: 90 Tab, Rfl: 3    warfarin (COUMADIN) 5 mg Oral Tablet, Take 1.5 Tabs (7.5 mg total) by mouth Once a day, Disp: 140 Tab, Rfl: 4  No current facility-administered medications for this visit.     Facility-Administered Medications Ordered in Other Visits:     iopamidol (ISOVUE-370) 76% infusion, 100 mL, Intravenous, Give in Radiology, Lars Masson, MD      Review of Systems -     Constitutional: negative for fevers  Eyes: denies blurry vision   ENT: no acute hearing loss   Cardiovascular: no new chest pain when walking or working   Respiratory: no new shortness of breath   GI: no constipation   GU: no difficulty urinating   Musculoskeletal: heel pain is resolved  Neurological: no new seizure or headaches  Emotional/Pscychiatric: no depression or anxiety  Skin: stasis changes in BL legs   Endocrine: new diabetes -- sugars are great   Hematologic/Lymphatic: no DVT or bleeding issues      History   Smoking Status    Never Smoker   Smokeless Tobacco    Never Used       OBJECTIVE:  BP 140/90   Pulse 98   Temp 36.4 C (97.5 F)   Ht 1.918 m (6' 3.5")   Wt 131.4 kg (289 lb 9.6 oz)   SpO2 98%   BMI 35.72 kg/m2    Counseling done about the following face-to-face:     Labs reviewed by me   Drug-drug interactions considered and reviewed by me     Physical Exam     All blood sugars reviewed together: see above for numbers     Diabetic foot exam:     No edema bilaterally. No ulcerations or lesions bilaterally. Pulses normal bilaterally.     Feet looks just great and we talked about shoes and socks all the time       We did a dietary review together today: protein and veggies; dairy products; some meal planning together       Labs -- reviewed by me in the EMR   January 2018    A1c 9.3%     December 2017    Colonic biopsy --  hyperplastic polyp only     September 2017    Creatinine 1.21    Hgb 15.18 August 2015    Hgb 15.7   Creatinine 1.14   CT scan of chest -- minimal lung nodules, not changed; not big     March 2017    INR 2.17 January 2015    Dr. Burke Keels note: no evidence of any metastatic disease    Dr. Dianah Field note: pT2N0 stage I colon cancer    Hgb 11.8    Creatinine 1.04    Glucose 99   LFT's okay   CEA 1.5   CT chest/abdomen/pelvis -- no evidence of recurrent disease; some small lung nodules     June 2016   [facility] Note:     Follow up colonoscopy in December 2016     CT scan of chest/abdomen/pelvis in summer 2017     May 2016   Creatinine 1.27   Glucose 136 (not fasting)    LFT's okay     March 2016    Creatinine 1.02   TSH 1.23   LFT's okay     Medications -- reviewed by me; drug-drug interactions considered by me        ASSESSMENT AND PLAN:  Hyperglycemia in the setting of diabetes  -- sugars are truly at goal without any medication at this time.   Way to go on dietary changes!   Keep up the great work -- he is doing a great job       Check sugars BID and then see me in 6 weeks

## 2016-05-11 ENCOUNTER — Other Ambulatory Visit: Payer: Self-pay

## 2016-06-04 ENCOUNTER — Ambulatory Visit (HOSPITAL_BASED_OUTPATIENT_CLINIC_OR_DEPARTMENT_OTHER): Payer: Medicare Other

## 2016-06-04 ENCOUNTER — Other Ambulatory Visit (HOSPITAL_COMMUNITY): Payer: Self-pay

## 2016-06-04 ENCOUNTER — Ambulatory Visit (HOSPITAL_BASED_OUTPATIENT_CLINIC_OR_DEPARTMENT_OTHER): Payer: Medicare Other | Admitting: Student in an Organized Health Care Education/Training Program

## 2016-06-12 ENCOUNTER — Ambulatory Visit (HOSPITAL_BASED_OUTPATIENT_CLINIC_OR_DEPARTMENT_OTHER)
Admission: RE | Admit: 2016-06-12 | Discharge: 2016-06-12 | Disposition: A | Discharge status: Home / Self Care | Payer: Medicare Other | Source: Ambulatory Visit | Attending: Student in an Organized Health Care Education/Training Program | Admitting: Student in an Organized Health Care Education/Training Program

## 2016-06-12 ENCOUNTER — Ambulatory Visit (HOSPITAL_BASED_OUTPATIENT_CLINIC_OR_DEPARTMENT_OTHER): Payer: Medicare Other | Admitting: Student in an Organized Health Care Education/Training Program

## 2016-06-12 ENCOUNTER — Ambulatory Visit
Admission: RE | Admit: 2016-06-12 | Discharge: 2016-06-12 | Disposition: A | Discharge status: Home / Self Care | Payer: Medicare Other | Source: Ambulatory Visit | Attending: Student in an Organized Health Care Education/Training Program | Admitting: Student in an Organized Health Care Education/Training Program

## 2016-06-12 VITALS — BP 155/99 | HR 106 | Temp 97.5°F | Resp 20 | Ht 75.5 in | Wt 285.9 lb

## 2016-06-12 DIAGNOSIS — I482 Chronic atrial fibrillation: Secondary | ICD-10-CM | POA: Insufficient documentation

## 2016-06-12 DIAGNOSIS — I509 Heart failure, unspecified: Secondary | ICD-10-CM | POA: Insufficient documentation

## 2016-06-12 DIAGNOSIS — K8020 Calculus of gallbladder without cholecystitis without obstruction: Secondary | ICD-10-CM | POA: Insufficient documentation

## 2016-06-12 DIAGNOSIS — K4090 Unilateral inguinal hernia, without obstruction or gangrene, not specified as recurrent: Secondary | ICD-10-CM | POA: Insufficient documentation

## 2016-06-12 DIAGNOSIS — Z6835 Body mass index (BMI) 35.0-35.9, adult: Secondary | ICD-10-CM | POA: Insufficient documentation

## 2016-06-12 DIAGNOSIS — R590 Localized enlarged lymph nodes: Secondary | ICD-10-CM | POA: Insufficient documentation

## 2016-06-12 DIAGNOSIS — Z79899 Other long term (current) drug therapy: Secondary | ICD-10-CM | POA: Insufficient documentation

## 2016-06-12 DIAGNOSIS — R531 Weakness: Secondary | ICD-10-CM | POA: Insufficient documentation

## 2016-06-12 DIAGNOSIS — C182 Malignant neoplasm of ascending colon: Secondary | ICD-10-CM | POA: Insufficient documentation

## 2016-06-12 DIAGNOSIS — C189 Malignant neoplasm of colon, unspecified: Secondary | ICD-10-CM

## 2016-06-12 DIAGNOSIS — N189 Chronic kidney disease, unspecified: Secondary | ICD-10-CM | POA: Insufficient documentation

## 2016-06-12 DIAGNOSIS — E1165 Type 2 diabetes mellitus with hyperglycemia: Secondary | ICD-10-CM

## 2016-06-12 DIAGNOSIS — N433 Hydrocele, unspecified: Secondary | ICD-10-CM | POA: Insufficient documentation

## 2016-06-12 DIAGNOSIS — E669 Obesity, unspecified: Secondary | ICD-10-CM | POA: Insufficient documentation

## 2016-06-12 DIAGNOSIS — Z801 Family history of malignant neoplasm of trachea, bronchus and lung: Secondary | ICD-10-CM | POA: Insufficient documentation

## 2016-06-12 DIAGNOSIS — Q8740 Marfan's syndrome, unspecified: Secondary | ICD-10-CM | POA: Insufficient documentation

## 2016-06-12 DIAGNOSIS — I130 Hypertensive heart and chronic kidney disease with heart failure and stage 1 through stage 4 chronic kidney disease, or unspecified chronic kidney disease: Secondary | ICD-10-CM | POA: Insufficient documentation

## 2016-06-12 DIAGNOSIS — E119 Type 2 diabetes mellitus without complications: Secondary | ICD-10-CM

## 2016-06-12 DIAGNOSIS — Z888 Allergy status to other drugs, medicaments and biological substances status: Secondary | ICD-10-CM | POA: Insufficient documentation

## 2016-06-12 DIAGNOSIS — K432 Incisional hernia without obstruction or gangrene: Secondary | ICD-10-CM | POA: Insufficient documentation

## 2016-06-12 DIAGNOSIS — E039 Hypothyroidism, unspecified: Secondary | ICD-10-CM | POA: Insufficient documentation

## 2016-06-12 DIAGNOSIS — Z7901 Long term (current) use of anticoagulants: Secondary | ICD-10-CM | POA: Insufficient documentation

## 2016-06-12 DIAGNOSIS — E1122 Type 2 diabetes mellitus with diabetic chronic kidney disease: Secondary | ICD-10-CM | POA: Insufficient documentation

## 2016-06-12 DIAGNOSIS — Z794 Long term (current) use of insulin: Secondary | ICD-10-CM | POA: Insufficient documentation

## 2016-06-12 DIAGNOSIS — R911 Solitary pulmonary nodule: Secondary | ICD-10-CM | POA: Insufficient documentation

## 2016-06-12 DIAGNOSIS — Z8249 Family history of ischemic heart disease and other diseases of the circulatory system: Secondary | ICD-10-CM | POA: Insufficient documentation

## 2016-06-12 DIAGNOSIS — R5383 Other fatigue: Secondary | ICD-10-CM | POA: Insufficient documentation

## 2016-06-12 DIAGNOSIS — Z952 Presence of prosthetic heart valve: Secondary | ICD-10-CM | POA: Insufficient documentation

## 2016-06-12 DIAGNOSIS — Z9889 Other specified postprocedural states: Secondary | ICD-10-CM | POA: Insufficient documentation

## 2016-06-12 DIAGNOSIS — Z8379 Family history of other diseases of the digestive system: Secondary | ICD-10-CM | POA: Insufficient documentation

## 2016-06-12 DIAGNOSIS — K7460 Unspecified cirrhosis of liver: Secondary | ICD-10-CM | POA: Insufficient documentation

## 2016-06-12 DIAGNOSIS — Z9049 Acquired absence of other specified parts of digestive tract: Secondary | ICD-10-CM | POA: Insufficient documentation

## 2016-06-12 LAB — CBC WITH DIFF
BASOPHIL #: 0.06 x10ˆ3/uL (ref 0.00–0.20)
BASOPHIL %: 1 %
EOSINOPHIL #: 0.03 x10ˆ3/uL (ref 0.00–0.50)
EOSINOPHIL %: 1 %
HCT: 46.6 % (ref 36.7–47.0)
HCT: 46.6 % (ref 36.7–47.0)
HGB: 15.7 g/dL (ref 12.5–16.3)
LYMPHOCYTE #: 1.14 10*3/uL (ref 1.00–4.80)
LYMPHOCYTE #: 1.14 x10?3/uL (ref 1.00–4.80)
LYMPHOCYTE %: 20 %
MCH: 32 pg (ref 27.4–33.0)
MCHC: 33.6 g/dL (ref 32.5–35.8)
MCV: 95.3 fL (ref 78.0–100.0)
MONOCYTE #: 0.44 x10ˆ3/uL (ref 0.30–1.00)
MONOCYTE %: 8 %
MPV: 9.6 fL (ref 7.5–11.5)
NEUTROPHIL #: 4.2 x10ˆ3/uL (ref 1.50–7.70)
NEUTROPHIL %: 72 %
PLATELETS: 127 x10ˆ3/uL — ABNORMAL LOW (ref 140–450)
RBC: 4.89 x10ˆ6/uL (ref 4.06–5.63)
RDW: 14.5 % (ref 12.0–15.0)
WBC: 5.9 x10ˆ3/uL (ref 3.5–11.0)

## 2016-06-12 LAB — PLATELETS AND ANC CANCER CENTER
PLATELET COUNT (AUTO): 127 x10ˆ3/uL — ABNORMAL LOW (ref 140–450)
PMN ABS (AUTO): 4.2 x10ˆ3/uL (ref 1.50–7.70)

## 2016-06-12 LAB — COMPREHENSIVE METABOLIC PANEL, NON-FASTING
ALBUMIN: 3.8 g/dL (ref 3.4–4.8)
ALKALINE PHOSPHATASE: 65 U/L (ref <150)
ALT (SGPT): 21 U/L (ref <55)
ANION GAP: 6 mmol/L (ref 4–13)
AST (SGOT): 23 U/L (ref 8–48)
BILIRUBIN TOTAL: 1.4 mg/dL — ABNORMAL HIGH (ref 0.3–1.3)
BUN/CREA RATIO: 14 (ref 6–22)
BUN: 14 mg/dL (ref 8–25)
CALCIUM: 9.3 mg/dL (ref 8.5–10.2)
CHLORIDE: 104 mmol/L (ref 96–111)
CHLORIDE: 104 mmol/L (ref 96–111)
CO2 TOTAL: 27 mmol/L (ref 22–32)
CREATININE: 1.02 mg/dL (ref 0.62–1.27)
ESTIMATED GFR: >59 mL/min/1.73mˆ2 (ref >59)
GLUCOSE: 119 mg/dL (ref 65–139)
POTASSIUM: 4.8 mmol/L (ref 3.5–5.1)
PROTEIN TOTAL: 7.4 g/dL (ref 6.0–8.0)
SODIUM: 137 mmol/L (ref 136–145)

## 2016-06-12 LAB — PT/INR
INR: 1.92 — ABNORMAL HIGH (ref 0.80–1.20)
INR: 1.92 — ABNORMAL HIGH (ref 0.80–1.20)
PROTHROMBIN TIME: 22.1 s — ABNORMAL HIGH (ref 9.3–13.9)
PROTHROMBIN TIME: 22.1 seconds — ABNORMAL HIGH (ref 9.3–13.9)

## 2016-06-12 LAB — HGA1C (HEMOGLOBIN A1C WITH EST AVG GLUCOSE)
ESTIMATED AVERAGE GLUCOSE: 117 mg/dL
HEMOGLOBIN A1C: 5.7 % (ref 4.0–6.0)

## 2016-06-12 LAB — CARCINOEMBRYONIC ANTIGEN: CEA: 1.8 ng/mL (ref <=5.0)

## 2016-06-12 MED ORDER — DIATRIZOATE MEGLUMINE-DIATRIZOATE SODIUM 66 %-10 % ORAL SOLUTION
8.00 mL | ORAL | Status: AC
Start: 2016-06-12 — End: 2016-06-12
  Administered 2016-06-12: 8 mL via ORAL

## 2016-06-12 MED ORDER — IOPAMIDOL 300 MG IODINE/ML (61 %) INTRAVENOUS SOLUTION
100.00 mL | INTRAVENOUS | Status: AC
Start: 2016-06-12 — End: 2016-06-12
  Administered 2016-06-12: 11:00:00 100 mL via INTRAVENOUS

## 2016-06-12 MED ADMIN — diatrizoate meglumine-diatrizoate sodium 66 %-10 % oral solution: ORAL | @ 11:00:00

## 2016-06-12 NOTE — Cancer Center Note (Signed)
PRINCIPAL DIAGNOSIS: pT2N0 Stage I colon cancer    ONCOLOGICAL HISTORY:  April 2016-Large right hydrocele that was drained. He gained 60 lbs of water weight post-operatively and was put on lasix. There was concern for infection too and he was started on antibiotocs as well.   Jul 09, 2014. He presented with acute onset of rectal bleeding with bloody diarrhea and generalized weakness. He has been on anticoagulation for chronic atrial fibrillation aand St Jude metal valve replacement in 1999.  His INR shot up to 10.6 at that time from antibiotics & coumadin interaction  Jul 10, 2014, Colonoscopy was performed. It showed large mass on 1st fold rigth colon covering 1/3wall circumference. Clean, superficial ulcer overlying mass. No active bleeding on initial inspection. Multiple biopsies obtained with cold biopsy forceps. Pathology of ascending colon mass revealed adenocarcinoma  Jun 15, 2014. He underwent laproscopic right hemicolectomy. Pathology revealed moderately differentiated adenocarcinoma, resection margins free, 0/30 nodes, LVI neg, perineural invasion neg. pT2N0  Jan 23, 2015. Colonoscopy revealed a pedunculated polyp measuring 10 mm in size in the sigmoid colon. A polypectomy was performed using snare cautery. The resection was complete, the polyp tissue was completely retrieved. Pathology c/w tubulovillous adenoma    INTERIM HISTORY:  A 66 yr old male with Hx of Marfans, mechanical aortic valve (1999) (on anticoagulation), aFib (s/p cardioversion), DM, hypothyroidism who presented to [facility] w/ bloody diarrhea, supratherapeutic INR and hypotension.Colonoscopy remarkable for large mass along the 1st fold of the ascending colon concerning for malignancy. S/P laproscopic right hemicolectomy 06/15/2014. PT2N0, Stage 1 colon cancer. He is on active surveillance. He presents to office today for 59-month follow up. He is doing well, except more discomfort from his abdominal hernia. He often notices bulge and  discomfort after eating and activity. It remains reducible. He denies any N/V. He is moving his bowels regularly, denies any diarrhea or constipation, or blood per rectum.  Last colonoscopy in Dec 2017 was normal. Repeat recommended in 3 years. Appetite is good. He is off lasix and spirolactone.  He has diet controlled DM. FBG have been around 130. He has intentionally lost 5 lbs since last visit.  He also follows with his PCP in [place].    REVIEW OF SYSTEMS:  General: (-) pain. (-) fevers (-) chills. (-) weight loss. (+) fatigue.  Lymphatic: (-) palpable masses. (-) night sweats.  Heme: (-) easy bruising (-) bleeding. (-) recurrent infections.   HEENT. (-) vision changes (-) hearing changes. (-) dysphagia. (-) sore throat.   Heart: (+) mechanical valve (-) chest pain. (-) palpitation. (-) orthopnea. (-) LE edema.   Lungs: (-) dyspnea (on exertion) (-) hemoptysis. (-) cough.   Abdomen: (-) poor appetite. (+) abdominal pain. (-) nausea (-) vomiting. (-) diarrhea. (-) constipation.   GU: (-) dysuria (-) Urgency. (-) Hematuria.   MS. (-) joint pain (-) ext swelling. (-) Back pain.   Dermatologic: (-) rashes.    Psychiatric: (-) Depression. (-) anxiety. (-) insomnia.   Neurologic: (-) headaches. (-) neuropathy. (-) weakness. (-) memory problems.  Endocrine:(+) hypothyroidism  Other review of systems negative.       Past Medical History  Current Outpatient Prescriptions   Medication Sig   . cholecalciferol, vitamin D3, 1,000 unit Oral Tablet Take 1,000 Units by mouth Once a day   . docusate sodium (COLACE) 100 mg Oral Capsule Take 1 Cap (100 mg total) by mouth Twice daily   . furosemide (LASIX) 40 mg Oral Tablet TAKE 1 TAB (40 MG TOTAL) BY  MOUTH TWICE DAILY   . insulin lispro (HUMALOG KWIKPEN) 100 unit/mL Subcutaneous Insulin Pen Check prior to meals and bedtime If 151-200  -- 3 units // 201-250  -- 6 units //251-400  -- 9 units // Call MD if glucose < 70 OR >400   . Insulin Syringe-Needle U-100 (BD  INSULIN SYRINGE UF) 0.3 mL 30 gauge x 1/2" Syringe Use prior to meals and at bedtime   . levothyroxine (SYNTHROID) 200 mcg Oral Tablet TAKE 1 TABLET BY MOUTH ONCE DAILY   . MAGNESIUM CARBONATE ORAL Take by mouth Once a day   . POTASSIUM GLUCONATE ORAL Take by mouth Once a day   . spironolactone (ALDACTONE) 50 mg Oral Tablet TAKE ONE TABLET BY MOUTH ONE TIME DAILY   . warfarin (COUMADIN) 5 mg Oral Tablet Take 1.5 Tabs (7.5 mg total) by mouth Once a day     Allergies   Allergen Reactions   . Allegra [Fexofenadine]  Other Adverse Reaction (Add comment)     "retain fluid"     Past Medical History:   Diagnosis Date   . A-fib (HCC)     Chronic anti-coagulant therapy; Hx cardioversions   . Abdominal hernia     Ventral hernia; Left Inguinal hernia   . Acute on chronic renal failure ([place])    . Biliary and gallbladder disorder     "Gallstones"   . Cancer [facility])     Colon Cancer   . Congestive heart failure ([place])    . Diabetes mellitus ([place])     type ii diet controlled   . Dysrhythmias     a fib   . Hemorrhage of gastrointestinal tract 5/16   . Hemorrhagic shock    . Hepatic cirrhosis ([place])    . Hydrocele    . Hypertension 07/10/2014   . Marfan syndrome    . Muscle weakness     Generalized   . Obesity    . Syncope    . Thyroid disease     hypothyroid   . Transfusion history     s/p Multiple units PRC/FFP this adm.   . Valvular disease     St Judes aortic valve replacement-chronic anti-coagulant therapy   . Wears glasses          Past Surgical History:   Procedure Laterality Date   . COLONOSCOPY     . HX COLONOSCOPY     . HX HEART CATHETERIZATION     . [place]    st judes    . HX HEMICOLECTOMY Right 07/13/2014    Laparoscopic right hemicolectomy for colon cancer   . HX TONSIL AND ADENOIDECTOMY     . HX TONSILLECTOMY     . HX UPPER ENDOSCOPY  07/09/14   . HX WISDOM TEETH EXTRACTION           Family Medical History     Problem Relation (Age of Onset)    Cirrhosis Brother    Hypertension Mother    Lung Cancer  Father            Social History     Social History   . Marital status: Married     Spouse name: N/A   . Number of children: N/A   . Years of education: N/A     Social History Main Topics   . Smoking status: Never Smoker   . Smokeless tobacco: Never Used   . Alcohol use No   . Drug use:  No   . Sexual activity: Not on file     Other Topics Concern   . Ability To Walk 1 Flight Of Steps Without Sob/Cp Yes   . Routine Exercise No   . Ability To Walk 2 Flight Of Steps Without Sob/Cp Yes   . Unable To Ambulate No   . Total Care No   . Ability To Do Own Adl's Yes   . Uses Walker No   . Other Activity Level No   . Uses Cane No     Social History Narrative     Married, lives with wife,. Has 4 children. One daughter has Marfan's syndrome. He is retired, worked as Government social research officer in [place]:   [employer] [place]       Lab from 06/12/2016 in [place]    Temperature 36.4 C (97.5 F) filed at... 06/12/2016 1100    Heart Rate (!)  106 filed at... 06/12/2016 1100    Respiratory Rate 20 filed at... 06/12/2016 1100    BP (Non-Invasive) (!)  155/99 filed at... 06/12/2016 1100    Height 1.918 m (6' 3.5") filed at... 06/12/2016 1100    Weight 129.7 kg (285 lb 15 oz) filed at... 06/12/2016 1100    BMI (Calculated) 35.34 filed at... 06/12/2016 1100    BSA (Calculated) 2.63 filed at... 06/12/2016 1100     General appearance: Awake, alert and in no acute distress.  HEENT: Normocephalic, atraumatic, EOMI, sclera anicteric, oropharynx moist and pink, neck supple, trachea midline .  Respiratory: Clear to ausculatation bilaterally,no wheezes,crackles, rhonchi or rales.  Cardiovascular: RRR, no murmurs, rubs, gallops, no JVD, 2+ radial pulses bilaterally.  Gastrointestinal: Soft nontender, nondistended, no abdominal masses noted.  A hernia at the superior aspect of his incision. It is reducible. Incision has healed well.  Lymphatics: No palpable lymphadenopathy.  Musculoskeletal: 5/5 strength in the  upper and and 5/5 in lower extremities.  Skin: Non jaundiced, no rashes.  Neurologic: Cranial nerves II-XII grossly intact. Non-focal  Psychiatric: Normal affect, good insight.    LABS from today  Results for orders placed or performed during the hospital encounter of 06/12/16 (from the past 72 hour(s))   BLOOD CELL COUNT W/DIFF - CANCER CENTER    Narrative    The following orders were created for panel order BLOOD CELL COUNT W/DIFF - CANCER CENTER.  Procedure                               Abnormality         Status                     ---------                               -----------         ------                     PLATELETS AND ANC CANCER.Marland KitchenMarland Kitchen[[id number]]  Abnormal            Final result               CBC WITH DIFF[[id number]]                Abnormal            Final  result                 Please view results for these tests on the individual orders.   CARCINOEMBRYONIC ANTIGEN   Result Value Ref Range    CEA 1.8 <=5.0 ng/mL   COMPREHENSIVE METABOLIC PANEL, NON-FASTING   Result Value Ref Range    SODIUM 137 136 - 145 mmol/L    POTASSIUM 4.8 3.5 - 5.1 mmol/L    CHLORIDE 104 96 - 111 mmol/L    CO2 TOTAL 27 22 - 32 mmol/L    ANION GAP 6 4 - 13 mmol/L    BUN 14 8 - 25 mg/dL    CREATININE 1.02 0.62 - 1.27 mg/dL    BUN/CREA RATIO 14 6 - 22    ESTIMATED GFR >59 >59 mL/min/1.52m^2    ALBUMIN 3.8 3.4 - 4.8 g/dL    CALCIUM 9.3 8.5 - 10.2 mg/dL    GLUCOSE 119 65 - 139 mg/dL    ALKALINE PHOSPHATASE 65 <150 U/L    ALT (SGPT) 21 <55 U/L    AST (SGOT) 23 8 - 48 U/L    BILIRUBIN TOTAL 1.4 (H) 0.3 - 1.3 mg/dL    PROTEIN TOTAL 7.4 6.0 - 8.0 g/dL   PT/INR   Result Value Ref Range    PROTHROMBIN TIME 22.1 (H) 9.3 - 13.9 seconds    INR 1.92 (H) 0.80 - 1.20    Narrative    Coumadin therapy INR range for Conventional Anticoagulation is 2.0 to 3.0 and for Intensive Anticoagulation 2.5 to 3.5.   [facility]LETS AND [facility] CANCER CENTER   Result Value Ref Range    PMN ABS (AUTO) 4.20 1.50 - 7.70 x10^3/uL    PLATELET COUNT (AUTO) 127 (L) 140 - 450  x10^3/uL   CBC WITH DIFF   Result Value Ref Range    WBC 5.9 3.5 - 11.0 x10^3/uL    RBC 4.89 4.06 - 5.63 x10^6/uL    HGB 15.7 12.5 - 16.3 g/dL    HCT 46.6 36.7 - 47.0 %    MCV 95.3 78.0 - 100.0 fL    MCH 32.0 27.4 - 33.0 pg    MCHC 33.6 32.5 - 35.8 g/dL    RDW 14.5 12.0 - 15.0 %    PLATELETS 127 (L) 140 - 450 x10^3/uL    MPV 9.6 7.5 - 11.5 fL    NEUTROPHIL % 72 %    LYMPHOCYTE % 20 %    MONOCYTE % 8 %    EOSINOPHIL % 1 %    BASOPHIL % 1 %    NEUTROPHIL # 4.20 1.50 - 7.70 x10^3/uL    LYMPHOCYTE # 1.14 1.00 - 4.80 x10^3/uL    MONOCYTE # 0.44 0.30 - 1.00 x10^3/uL    EOSINOPHIL # 0.03 0.00 - 0.50 x10^3/uL    BASOPHIL # 0.06 0.00 - 0.20 x10^3/uL            PROCEDURE:  COLONOSCOPY 01/23/2015 FINDINGS: 1. COLON FINDINGS: A pedunculated polyp measuring 10 mm in size was  found in the sigmoid colon. A polypectomy was performed using snare cautery.  The resection was complete, the polyp tissue was completely retrieved and sent  to histology.  2. Mild diverticulosis was noted in the sigmoid colon.  3. There was evidence of a normal appearing prior surgical anastomosis in the  ascending colon. Retroflexed views in the rectum revealed internal  hemorrhoids.   The scope was then withdrawn from the patient and the  procedure terminated. Vital signs  were monitored constantly through out the  Procedure.      PATHOLOGY REVIEW  Final Pathologic Diagnosis    RIGHT COLON, HEMICOLECTOMY:    - Moderately differentiated adenocarcinoma. (See Cancer Case Summary.)    - Resection margins are free of neoplasm.    - Thirty lymph nodes identified, all negative for malignancy (0/30).    - Pathologic staging: pT2 N0.    - Appendix with no diagnostic abnormalities recognized.          CAP CANCER CASE SUMMARY: COLON    Specimen: Terminal ileum, cecum, appendix and ascending colon.  Procedure: Right hemicolectomy.  Tumor site: Right ascending colon.  Tumor size: Greatest dimension: 3.5 cm.   Macroscopic tumor perforation: Not identified.  Histologic type: Adenocarcinoma.  Histologic grade: Low-grade (well-differentiated to moderately differentiated).  Microscopic tumor extension: Tumor invades muscularis propria.  Margins: All margins uninvolved by invasive carcinoma.    Distance of invasive carcinoma from closest margin: 5.5 cm from proximal  margin.  Lymph-vascular invasion: Not identified.  Perineural invasion: Not identified.  Tumor deposits: Not identified.  Pathologic staging: pT2, pN0.    Number of lymph nodes examined: 59.    Number of lymph nodes involved: 0.  Additional pathologic findings: None identified.        IMAGING  1) CT 07/11/2014  IMPRESSION:   1.Small bilateral pleural effusions. 2 pulmonary nodules, largest measuring 4 mm. Followup with CT chest in 6-12 months is recommended.  2. Cholelithiasis with mild gallbladder wall thickening. If there is concern for acute cholecystitis further evaluation with ultrasound is recommended.  3. Patient's known cecal mass is not visualized. No evidence of obstruction. Diverticulosis without evidence for complicated disease.  4. Mild nonspecific mesenteric stranding and minimal free fluid within the pelvis.    2) CT CHEST ABDOMEN PELVIS W IV CONTRAST 01/24/2015    CHEST:  HEART/MEDIASTINUM/ESOPHAGUS: Heart size is normal. There is no pericardial effusion. There are mild coronary calcifications. Diameter of the aorta and pulmonary trunk is normal. Multiple subcentimeter mediastinal and right hilar lymph nodes are seen.     LUNGS/PLEURA: 3 mm nodular region on series 10 image 42 likely represents a vessel en face. There is a 2 mm nodule right upper lobe on series 10 image 54. Additional 2 mm nodule right upper lobe on series 10 image 71 identified and likely calcified granuloma is seen on series 4 image 69 within the right middle lobe measuring 4 mm. There are multiple additional 2 mm nodules within the right middle  and lower lobes as well as few tiny calcified nodules likely representing granulomas. Additionally, there are multiple approximately 2 mm nodules scattered throughout the left lung and tiny calcified granulomas. There is no pneumothorax or pleural effusion.    OTHER: Prominent right cervical nodes are noted, maximum dimension measuring 1.4 cm. The thyroid is unremarkable. Sclerotic lesion within the distal right clavicle on series 3 image 14 likely represents a bone island.    ABDOMEN/PELVIS:  LIVER: There is a nonspecific 4 mm linear hypoattenuation within the left hepatic lobe seen on series 5 image 12 which likely represents a bile duct. The liver size is normal. There is a mildly dysmorphic liver with no overt signs of cirrhosis.    BILIARY SYSTEM/GALLBLADDER: There is no intrahepatic or extrahepatic bile duct dilation. Cholelithiasis is identified, with no evidence of gallbladder distention or pericholecystic fluid.    PANCREAS: There is mild fatty replacement of the pancreas. No peripancreatic stranding or pancreatic duct dilatation is identified.  STOMACH: The stomach is incompletely distended, limiting evaluation for wall thickening.    SPLEEN: The spleen is of normal size and enhances homogenously.    ADRENALS: No adrenal nodules are seen.    KIDNEYS/URETER/BLADDER: No renal masses or renal calculi are visualized. There is no hydronephrosis. The bilateral ureters are of normal caliber. Bladder is incompletely distended, limiting evaluation for wall thickening.    REPRODUCTIVE ORGANS: The prostate is of normal size and the seminal vesicles are symmetric.    BOWEL: Postsurgical changes from right hemicolectomy are identified. Anastomotic site within the right abdomen is seen , with evidence of few subcentimeter shotty mesenteric lymph nodes in this region as well as any nodular density on series 14 image 40 which could represent lymph nodes or postsurgical scarring. There is no evidence of bowel  obstruction. Evaluation for bowel wall thickening is limited due to incomplete distention. There is evidence of uncomplicated sigmoid diverticulosis.    VASCULATURE/LYMPH NODES: The aorta demonstrates mild atherosclerotic disease. The celiac, superior mesenteric, bilateral renal arteries and inferior mesenteric artery are patent. The portal vein and the SMV are unremarkable. There are multiple subcentimeter celiac axis and periportal lymph nodes as well as multiple subcentimeter periaortic lymph nodes. There are upper limits of normal in size bilateral inguinal lymph nodes, the largest is in the right inguinal canal measuring 1.4 x 1.2 cm which appears to contain a fatty hilum and is likely benign.    PERITONEUM / RETROPERITONEUM: No free air or fluid is identified within the abdomen or pelvis.    BONES/SOFT TISSUES: There is a sclerotic rounded lesion within the T5 vertebral body, which likely represents a bone island. There is a left inguinal hernia which contains a segment of the sigmoid colon. There is no bowel obstruction. Diffuse degenerative changes are seen in the spine. No suspicious osseous lesions are visualized. There is a tiny fat containing umbilical hernia.  IMPRESSION:   1. Postsurgical changes from right hemicolectomy for colonic adenocarcinoma with nodular density adjacent to the anastomotic site which could represent shotty lymph nodes or scarring. Close attention on followup is recommended.  2. Left inguinal hernia containing a segment of sigmoid colon with no evidence of bowel obstruction.  3. Multiple tiny pulmonary nodules. Chest CT is recommended in 6 months for followup.   4. Prominent right cervical lymph nodes, which are nonspecific and could be reactive. Clinical correlation is recommended.   3) CT CHEST ABDOMEN PELVIS W IV CONTRAST performed on 06/12/2016     INDICATION: 66 years old Male  C18.9: Colon cancer ([place])    TECHNIQUE: Axial images from the thoracic inlet through the pelvis  obtained  after administration of IV contrast with reformatted coronal and sagittal  images, utilizing soft tissue and lung algorithms    CONTRAST: 100 cc of Isovue 300    RADIATION DOSE: 2572.50 mGycm    COMPARISON: CT chest abdomen pelvis January 24, 2015    FINDINGS:  CHEST:  Lungs/Pleura: Numerous bilateral pulmonary nodules are stable in size, all  measuring 4 mm or less, and many of which are calcified and likely  representing granulomas. A new small area of groundglass opacity is seen in  the right upper lobe on series 4 image 45-49. This has a nodular appearing  component which measures 9 mm in maximal diameter on image 48. No other  areas of groundglass opacity are seen. The lungs are otherwise clear. There  is no evidence of pneumothorax or pleural effusion.    Heart/Mediastinum: Heart  size appears within normal limits. No pericardial  effusion is seen. There is some mild coronary artery calcifications. Aortic  and pulmonary artery diameters are within normal limits. Multiple prominent  but subcentimeter lymph nodes are seen throughout the mediastinum. A large  paratracheal lymph node is seen on series 3 image 50, measuring 2.4 cm in  maximal diameter, previously 1.9 cm. Multiple prominent right cervical  lymph nodes are again seen, unchanged.      ABDOMEN:  Liver: The liver appears within normal limits for size, with a slightly  lobular contour.     Gallbladder/Biliary System: The gallbladder is within normal limits for  size with numerous low density gallstones seen intraluminally no biliary  ductal dilation is seen.. There is no evidence of acute cholecystitis.      Spleen: Normal in size and attenuation.    Pancreas: Moderately atrophic without ductal dilation.    Adrenals: Grossly unremarkable bilaterally.    Kidney/Ureters/Bladder: Kidneys are normal in size without evidence of  renal calculi or hydronephrosis. Mild bilateral perinephric stranding is  present, similar to the prior study,  and is nonspecific. Bilateral ureters  are of normal caliber throughout their length. The bladder is incompletely  distended, limiting evaluation of bladder wall.     Reproductive Organs: The prostate appears enlarged and is causing mild mass  effect on the inferior border of the bladder.    Stomach: The stomach is decompressed.    Bowel: Examination of the bowel is limited due to incomplete opacification;  however, no wall thickening is visualized. Postoperative changes from right  hemicolectomy are again seen with anastomotic site within the right  abdomen. The previously identified adjacent nodular densities are not well  seen on today's study. There is no evidence of bowel obstruction.  Innumerable diverticula are seen within the colon, predominantly in the  sigmoid. There is some mild mesenteric fat stranding seen adjacent to a  portion of the sigmoid colon on series 6 image 61, which is nonspecific and  unchanged from the prior study.      Vasculature: The abdominal aorta is of normal caliber with some mild  atherosclerotic calcifications. The portal and hepatic veins are patent.      Peritoneal Cavity/Lymph Nodes: No free fluid, free air, or suspicious  lymphadenopathy are seen within the abdomen.     Soft Tissues/Bones: A ventral wall hernia is well-visualized on series 6  image 53 with the abdominal wall defect measuring 5.7 x 3.7 cm. This hernia  contains loops of small bowel, without evidence of strangulation. A  periumbilical hernia is also seen within the abdominal wall defect  measuring 5.6 x 4.5 cm, which also contains a loop of small bowel without  evidence of strangulation. A left inguinal hernia is again seen, containing  only fat without evidence of strangulation. A rounded sclerotic lesion is  unchanged in appearance within the T5 vertebral body, and likely represents  bone island. Moderate degenerative disc disease is seen throughout the  majority of the spine with severe degenerative disc  disease in the distal  lumbosacral spine, worst at the L5-S1 level.      IMPRESSION:  1.  New area of groundglass opacity in the right upper lobe with a nodular  more solid appearing component measuring 9 mm. This appearance is  suspicious for malignancy, although localized infection may have a similar  appearance.  2.  Increased size of paratracheal lymph node, now measuring 2.4 cm. While this retains benign characteristics, metastatic disease is  not excluded.   3.  Unchanged size of multiple tiny pulmonary nodules.  4.  Unchanged size of multiple prominent right cervical lymph nodes.  5.  Cholelithiasis without evidence of cholecystitis. Sigmoid  diverticulosis without specific evidence of diverticulitis.  6.  New ventral and periumbilical hernias containing loops of small bowel  without evidence of strangulation.    ASSESSMENT AND PLAN  A 66 yr old male with Hx of Marfans, mechanical aortic valve (1999) (on anticoaggulation), aFib (s/p cardioversion), DM, hypothyroidism who presented to [facility] w/ bloody diarrhea, supratherapeutic INR and hypotension.Colonoscopy remarkable for large mass along the 1st fold of the ascending colon concerning for malignancy. S/P laproscopic right hemicolectomy 06/15/2014. Pathology revealed 3.5 cm well differentiated to moderately differentiated adenocarcinoma, resection margins free, 0/30 nodes, LVI neg, perineural invasion neg. PT2N0, Stage 1  The vast majority of studies exploring the benefits of posttreatment surveillance have been conducted in patients with resected stage II or III disease. Whether or not intensive posttreatment surveillance is needed after resection of a stage I colon cancer is controversial, and the guidelines differ on this point. I recommended periodic history and physical examination, CEA testing, CT scanning and endoscopic surveillance, extrapolating from stage II and III data.    Stage 1 Colon Cancer  -On active surveillance. No signs /symptoms of  recurrence. Pt is doing well clinically.   -Checked labs today including CEA WNL EXCEPT PLT 127 and T. bil slightly elevated at 1.4. His bil has been slightly elevated on prior occasions too.  -On colonoscopy 01/23/2015 a pedunculated polyp measuring 10 mm in size was found in the sigmoid colon and polypectomy was performed. Pathology revealed Tubulovillous adenoma. Colonoscopy at 1 year WNL, repeating in 3 years.  - CT C/A/P scan today as per my interpretation of images showed a new subcm RUL nodule and increase in size of paratracheal LN. PET-CT ordered for further evaluation.  -Diet, Exercise encouraged, Vit D recommended  -Follow up every 3 months for 1st 2 years and then every 6 months during years 3, 4 and 5.     Uncontrolled DM type 2  -Currently controlled by diet only, previously on Metformin  -Follows with PCP     Incisional Hernia  -Evaluated by Dr. Catarina Hartshorn 02/2016  for consideration of hernia repair.  -He was felt to be not a candidate for elective repair based on his elevated A1c level, his Marfan's disease and his obesity.  -Follow up in 6 months with repeat scans recommended.    Cirrhosis of Liver  -LFTs WNL  -[place] RUQ 01/24/2015 showed no evidence to suggest cirrhosis. Previous US Liver in 06/2014 reported cirrhosis of liver. He has no clinical stigmata of cirrhosis  -Will have him follow with GI hepatology for cirrhosis if needed    Mechanical aortic valve (1999)   -on anticoagulation, coumadin managed by PCP. INR today is 1.92  -on coumadin 7.5 mg daily.    HTN: Sees his PCP May 21st    RTC in 3 months.       Ardeth Sportsman, MD      He had PET-CT on 5/15 that showed a new pulm nodule and inc in hilar LN with mild FDG uptake. Images were reviewed in tumor boards and felt to be inflammatory/infectious. Follow-up scans in 3 months were suggested. Updated patient of the results.

## 2016-06-12 NOTE — Progress Notes (Signed)
Tried to call the Pt, but his mailbox is full. No VM could be left.

## 2016-06-15 ENCOUNTER — Ambulatory Visit (HOSPITAL_BASED_OUTPATIENT_CLINIC_OR_DEPARTMENT_OTHER): Payer: Self-pay | Admitting: Student in an Organized Health Care Education/Training Program

## 2016-06-15 ENCOUNTER — Other Ambulatory Visit (HOSPITAL_BASED_OUTPATIENT_CLINIC_OR_DEPARTMENT_OTHER): Payer: Self-pay | Admitting: Student in an Organized Health Care Education/Training Program

## 2016-06-15 DIAGNOSIS — C189 Malignant neoplasm of colon, unspecified: Secondary | ICD-10-CM

## 2016-06-15 NOTE — Telephone Encounter (Addendum)
Dr. Landry Corporal called pt and went over CT scan results. Order for PET/CT is being placed. Doristine Bosworth, RN  06/15/2016, 16:47      ----- Message from [place] sent at 06/15/2016  4:16 PM EDT -----  Landry Corporal     Pt wants his CT results to be called to him please.     Thank you

## 2016-06-17 ENCOUNTER — Telehealth (HOSPITAL_BASED_OUTPATIENT_CLINIC_OR_DEPARTMENT_OTHER): Payer: Self-pay | Admitting: Student in an Organized Health Care Education/Training Program

## 2016-06-17 NOTE — Telephone Encounter (Signed)
Called pt with PET/CT appt 06/23/16 @ 1pm. Went over instructions. Pt verbalizes understanding. Doristine Bosworth, RN  06/17/2016, 16:23

## 2016-06-19 ENCOUNTER — Encounter (INDEPENDENT_AMBULATORY_CARE_PROVIDER_SITE_OTHER): Payer: Medicare Other | Admitting: INTERNAL MEDICINE

## 2016-06-23 ENCOUNTER — Ambulatory Visit
Admission: RE | Admit: 2016-06-23 | Discharge: 2016-06-23 | Disposition: A | Discharge status: Home / Self Care | Payer: Medicare Other | Source: Ambulatory Visit | Attending: Student in an Organized Health Care Education/Training Program | Admitting: Student in an Organized Health Care Education/Training Program

## 2016-06-23 DIAGNOSIS — R591 Generalized enlarged lymph nodes: Secondary | ICD-10-CM

## 2016-06-23 DIAGNOSIS — R911 Solitary pulmonary nodule: Secondary | ICD-10-CM

## 2016-06-23 DIAGNOSIS — C189 Malignant neoplasm of colon, unspecified: Secondary | ICD-10-CM

## 2016-06-23 LAB — POC BLOOD GLUCOSE (RESULTS): GLUCOSE, POC: 92 mg/dL (ref 70–105)

## 2016-06-23 MED ORDER — DIATRIZOATE MEGLUMINE-DIATRIZOATE SODIUM 66 %-10 % ORAL SOLUTION
10.00 mL | ORAL | Status: AC
Start: 2016-06-23 — End: 2016-06-23
  Administered 2016-06-23: 14:00:00 10 mL via ORAL

## 2016-06-23 MED ORDER — IOPAMIDOL 300 MG IODINE/ML (61 %) INTRAVENOUS SOLUTION
100.00 mL | INTRAVENOUS | Status: AC
Start: 2016-06-23 — End: 2016-06-23
  Administered 2016-06-23: 15:00:00 100 mL via INTRAVENOUS

## 2016-06-25 ENCOUNTER — Other Ambulatory Visit (HOSPITAL_BASED_OUTPATIENT_CLINIC_OR_DEPARTMENT_OTHER): Payer: Self-pay | Admitting: Student in an Organized Health Care Education/Training Program

## 2016-06-25 NOTE — Progress Notes (Signed)
GASTROINTESTINAL MULTIDISCIPLINARY TUMOR BOARD DISCUSSION:    PATIENT:   Kenneth Weeks NUMBER:   [id number]  DOB:    1950-12-24  AGE:   66 y.o.  DATE:   06/25/2016    REFERRING PROVIDER: No ref. provider found  PCP: Viona Gilmore, MD    PRESENTER:  Ardeth Sportsman, MD  TYPE OF PRESENTATION: Prospective, Review recent PET and CT scans  PATHOLOGY REVIEWED AT [place]?: Yes  RADIOGRAPHS REVIEWED AT [facility]?: Yes  NATIONAL GUIDELINES DISCUSSED?: Yes; NCCN    DIAGNOSIS: Colon Cancer  DIAGNOSIS LOCATION: [place]  DIAGNOSIS METHOD: Biopsy  PATHOLOGICAL STAGE:  Stage I, T2, N0  RECURRENT?: No    FAMILY HISTORY?: Not Significant, father with lung cancer  GENETIC TESTING?: No  PROGNOSTIC INDICATORS DISCUSSED: Yes    INTERVENTIONS:   SURGICAL/PROCEDURES: Patient had right hemicolectomy in June 2016 performed by Dr. Catarina Hartshorn at [place] (ENDOSCOPIC):  Patient had colonoscopy in May 2016, December 2016, and December 2017 all performed by Dr. Katina Degree at [place]: No  ELIGIBLE CLINICAL TRIALS: No    NEED FOR PALLIATIVE CARE?: No  PSYCHOSOCIAL CONCERNS?: No  REHABILITATION CONCERNS?: No  NUTRITIONAL CONCERNS?: No    PRELIMINARY RECOMMENDATIONS BASED ON TODAY'S TUMOR BOARD DISCUSSION:   Repeat imaging in three months  Observation  Biopsy of lymph nodes in the future    Velda Shell,   06/25/2016 11:03  GI Tumor Board Coordinator

## 2016-06-26 ENCOUNTER — Other Ambulatory Visit (HOSPITAL_BASED_OUTPATIENT_CLINIC_OR_DEPARTMENT_OTHER): Payer: Self-pay | Admitting: Student in an Organized Health Care Education/Training Program

## 2016-06-26 DIAGNOSIS — C189 Malignant neoplasm of colon, unspecified: Secondary | ICD-10-CM

## 2016-07-15 ENCOUNTER — Ambulatory Visit: Payer: Medicare Other | Attending: INTERNAL MEDICINE

## 2016-07-15 ENCOUNTER — Ambulatory Visit (INDEPENDENT_AMBULATORY_CARE_PROVIDER_SITE_OTHER): Payer: Self-pay | Admitting: INTERNAL MEDICINE

## 2016-07-15 DIAGNOSIS — Z7901 Long term (current) use of anticoagulants: Secondary | ICD-10-CM

## 2016-07-15 DIAGNOSIS — Z5181 Encounter for therapeutic drug level monitoring: Secondary | ICD-10-CM | POA: Insufficient documentation

## 2016-07-15 LAB — PT/INR
INR: 2.56
PROTHROMBIN TIME: 28.2 s — ABNORMAL HIGH (ref 9.4–12.5)

## 2016-07-15 NOTE — Progress Notes (Signed)
07/15/16 1200   [facility] PT/INR   Date INR drawn 07/15/16   INR 2.6   New warfarin dosage INR is at goal   New weekly dosgae keep the same   Comments Check INR in one month   Initials [facility]   Anticoag Indication Other (see below)   Goal INR 2.5-3.5

## 2016-07-15 NOTE — Progress Notes (Signed)
Left VM for Pt to call the office.

## 2016-07-16 NOTE — Progress Notes (Signed)
Pt notified of INR results and verbalized understanding as to keeping with his current dose of warafrin. Pt has F/U lab appt on 08/17/2016.

## 2016-08-17 ENCOUNTER — Other Ambulatory Visit: Payer: Self-pay

## 2016-08-18 ENCOUNTER — Encounter (INDEPENDENT_AMBULATORY_CARE_PROVIDER_SITE_OTHER): Payer: Self-pay | Admitting: INTERNAL MEDICINE

## 2016-08-18 ENCOUNTER — Ambulatory Visit (INDEPENDENT_AMBULATORY_CARE_PROVIDER_SITE_OTHER): Payer: Self-pay | Admitting: INTERNAL MEDICINE

## 2016-08-18 ENCOUNTER — Ambulatory Visit (INDEPENDENT_AMBULATORY_CARE_PROVIDER_SITE_OTHER): Payer: Medicare Other | Admitting: INTERNAL MEDICINE

## 2016-08-18 ENCOUNTER — Ambulatory Visit: Payer: Medicare Other | Attending: INTERNAL MEDICINE

## 2016-08-18 VITALS — BP 144/98 | HR 102 | Temp 98.0°F | Ht 75.0 in | Wt 281.0 lb

## 2016-08-18 DIAGNOSIS — I4891 Unspecified atrial fibrillation: Secondary | ICD-10-CM

## 2016-08-18 DIAGNOSIS — C189 Malignant neoplasm of colon, unspecified: Secondary | ICD-10-CM

## 2016-08-18 DIAGNOSIS — E039 Hypothyroidism, unspecified: Secondary | ICD-10-CM

## 2016-08-18 DIAGNOSIS — E119 Type 2 diabetes mellitus without complications: Secondary | ICD-10-CM

## 2016-08-18 DIAGNOSIS — I5030 Unspecified diastolic (congestive) heart failure: Secondary | ICD-10-CM

## 2016-08-18 LAB — PT/INR
INR: 3.03
PROTHROMBIN TIME: 33.3 s — ABNORMAL HIGH (ref 9.4–12.5)
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
## 2016-08-18 NOTE — Progress Notes (Signed)
08/18/16 1200   [facility] PT/INR   Date INR drawn 08/18/16   INR 3.0   New warfarin dosage INR is at goal   New weekly dosgae Keep the same   Comments Check INR in one month   Initials [facility]   Anticoag Indication VALVE   Goal INR 2.5-3.5

## 2016-08-18 NOTE — Nursing Note (Signed)
BP (!) 144/98  Pulse (!) 102  Temp 36.7 C (98 F)  Ht 1.905 m (6\' 3" )  Wt 127.5 kg (281 lb)  SpO2 98%  BMI 35.12 kg/m2  Kenneth Weeks, [place]  08/18/2016, 09:01

## 2016-08-18 NOTE — Progress Notes (Signed)
Left VM for Pt to call the office.  [place], [place]  08/18/2016, 13:36

## 2016-08-18 NOTE — Progress Notes (Signed)
Spoke to patient he is aware of note and has an appt

## 2016-09-01 ENCOUNTER — Encounter (INDEPENDENT_AMBULATORY_CARE_PROVIDER_SITE_OTHER): Payer: Self-pay | Admitting: Physician Assistant

## 2016-09-01 ENCOUNTER — Other Ambulatory Visit (HOSPITAL_COMMUNITY): Payer: Self-pay

## 2016-09-07 NOTE — Telephone Encounter (Signed)
Please close encounter, you have an incomplete note.

## 2016-09-14 ENCOUNTER — Ambulatory Visit: Payer: Medicare Other | Attending: INTERNAL MEDICINE

## 2016-09-14 ENCOUNTER — Ambulatory Visit (INDEPENDENT_AMBULATORY_CARE_PROVIDER_SITE_OTHER): Payer: Self-pay | Admitting: INTERNAL MEDICINE

## 2016-09-14 DIAGNOSIS — I4891 Unspecified atrial fibrillation: Secondary | ICD-10-CM | POA: Insufficient documentation

## 2016-09-14 LAB — PT/INR
INR: 2.16
PROTHROMBIN TIME: 23.8 s — ABNORMAL HIGH (ref 9.4–12.5)

## 2016-09-14 NOTE — Progress Notes (Signed)
Pt notified of results and verbalized understanding. Pt had no questions or concerns.  Pt scheduled on 09/28/16 for recheck.  [name], CMA  09/14/2016, 15:53

## 2016-09-14 NOTE — Progress Notes (Signed)
09/14/16 1500   [facility] PT/INR   Date INR drawn 09/14/16   INR 2.1   New warfarin dosage INR is close to goal   New weekly dosgae Keep the same, but check in two week   Initials [facility]   Anticoag Indication VALVE   Goal INR 2.5-3.5

## 2016-09-22 ENCOUNTER — Encounter (INDEPENDENT_AMBULATORY_CARE_PROVIDER_SITE_OTHER): Payer: Self-pay | Admitting: Physician Assistant

## 2016-09-22 ENCOUNTER — Ambulatory Visit (HOSPITAL_BASED_OUTPATIENT_CLINIC_OR_DEPARTMENT_OTHER): Payer: Medicare Other | Admitting: Physician Assistant

## 2016-09-22 ENCOUNTER — Ambulatory Visit
Admission: RE | Admit: 2016-09-22 | Discharge: 2016-09-22 | Disposition: A | Discharge status: Home / Self Care | Payer: Medicare Other | Source: Ambulatory Visit | Attending: Student in an Organized Health Care Education/Training Program | Admitting: Student in an Organized Health Care Education/Training Program

## 2016-09-22 VITALS — BP 126/80 | HR 65 | Temp 98.1°F | Ht 75.0 in | Wt 279.1 lb

## 2016-09-22 DIAGNOSIS — Z85038 Personal history of other malignant neoplasm of large intestine: Secondary | ICD-10-CM | POA: Insufficient documentation

## 2016-09-22 DIAGNOSIS — E669 Obesity, unspecified: Secondary | ICD-10-CM | POA: Insufficient documentation

## 2016-09-22 DIAGNOSIS — Q8740 Marfan's syndrome, unspecified: Secondary | ICD-10-CM

## 2016-09-22 DIAGNOSIS — R911 Solitary pulmonary nodule: Secondary | ICD-10-CM | POA: Insufficient documentation

## 2016-09-22 DIAGNOSIS — C182 Malignant neoplasm of ascending colon: Secondary | ICD-10-CM

## 2016-09-22 DIAGNOSIS — C189 Malignant neoplasm of colon, unspecified: Secondary | ICD-10-CM

## 2016-09-22 DIAGNOSIS — Z08 Encounter for follow-up examination after completed treatment for malignant neoplasm: Secondary | ICD-10-CM | POA: Insufficient documentation

## 2016-09-22 DIAGNOSIS — R591 Generalized enlarged lymph nodes: Secondary | ICD-10-CM | POA: Insufficient documentation

## 2016-09-22 DIAGNOSIS — Z6834 Body mass index (BMI) 34.0-34.9, adult: Secondary | ICD-10-CM | POA: Insufficient documentation

## 2016-09-22 DIAGNOSIS — Z9049 Acquired absence of other specified parts of digestive tract: Secondary | ICD-10-CM | POA: Insufficient documentation

## 2016-09-22 DIAGNOSIS — K439 Ventral hernia without obstruction or gangrene: Secondary | ICD-10-CM | POA: Insufficient documentation

## 2016-09-22 DIAGNOSIS — R918 Other nonspecific abnormal finding of lung field: Secondary | ICD-10-CM

## 2016-09-22 DIAGNOSIS — K8020 Calculus of gallbladder without cholecystitis without obstruction: Secondary | ICD-10-CM

## 2016-09-22 LAB — POC ISTAT CREATININE (RESULT): CREATININE, POC: 0.9 mg/dL (ref 0.62–1.27)

## 2016-09-22 LAB — POC BLOOD GLUCOSE (RESULTS): GLUCOSE, POC: 97 mg/dL (ref 70–105)

## 2016-09-22 MED ORDER — IOPAMIDOL 300 MG IODINE/ML (61 %) INTRAVENOUS SOLUTION
100.00 mL | INTRAVENOUS | Status: AC
Start: 2016-09-22 — End: 2016-09-22
  Administered 2016-09-22 (×2): 100 mL via INTRAVENOUS

## 2016-09-22 MED ORDER — DIATRIZOATE MEGLUMINE-DIATRIZOATE SODIUM 66 %-10 % ORAL SOLUTION
10.00 mL | ORAL | Status: AC
Start: 2016-09-22 — End: 2016-09-22
  Administered 2016-09-22 (×2): 10 mL via ORAL

## 2016-09-22 NOTE — Progress Notes (Signed)
Patient: Kenneth Weeks  D.O.B.: 03-Jan-1951  MRN# [id number]  Date of Service: 09/22/2016    HPI  I am seeing Kenneth Weeks today in followup at the [facility] surgical oncology outpatient clinic. He is a 66 y.o. male, approximately 2 years status post laparoscopic right hemicolectomy with Dr. Catarina Hartshorn in June 2016 for T2N0 colon cancer. He has been under surveillance since surgery by both Dr. Catarina Hartshorn and Dr. Landry Corporal. His last colonoscopy was in December 2017 and unremarkable. His last clinic visit was in May of this year with Dr. Landry Corporal at which time a CTCAP revealed a new subcm RUL nodule and increase in size of a paratracheal LN. Thus, PET/CT was ordered for further differentiation. This demonstrated only marginally hypermetabolic activity in the pulmonary nodule, as well as in supraclavicular, hilar, retroperitoneal, and inguinal lymphadenopathy. All was thought to be reactive, inflammatory, of infectious in nature. Thus, interval follow-up with PET/CT was recommended in 3 months. He is scheduled for this later today. He was also evaluated by Dr. Catarina Hartshorn for ventral hernia repair prior to his departure, however, given his obesity, Marfan syndrome, and uncontrolled diabetes (HgBA1c 9.3).    Today, the patient has no complaints other than increase in size of the hernia. Otherwise, he denies abdominal pain, nausea, vomiting, diarrhea, constipation, hematochezia, fever, chills, unintentional weight loss, or decreased appetite.     ROS:  Denies CP, SOB. +Fatigue.    PE:  In clinic, vitals are as follows: BP 126/80   Pulse 65   Temp 36.7 C (98.1 F) (Thermal Scan)    Ht 1.905 m (6\' 3" )   Wt 126.6 kg (279 lb 1.6 oz)   SpO2 95% Comment: room air   BMI 34.89 kg/m2. He looks well. The abdomen is soft. There is no palpable mass or jaundice. There is a ventral hernia at the extraction site, soft and reducible. Normal respiratory effort, no retractions.    CEA and HgBA1c pending.    Assessment/Plan:  My assessment is doing  well clinically from a colon cancer standpoint. He will undergo repeat PET/CT today given the new findings on his most recent imaging from May (RUL pulmonary nodule, lymphadenopathy). I will follow-up on the results once available, along with CEA and HgBA1c today. In regards to his hernia, I do not think he is a candidate for elective repair based on his elevated A1c level (although we will recheck today), his Marfan's disease and his obesity. However, we will continue to monitor. He was counseled on the signs and symptoms of hernia complications such as strangulation, incarceration, and obstruction and when to seek immediate medical attention. Dr. Landry Corporal will see him in November and I will see him in 6 months, sooner if necessary based on imaging results.    He was given the opportunity to ask questions and those questions were answered to his satisfaction. he agreed with the treatment plan and was encouraged to call with any additional questions or concerns. The patient was seen independently without cosigning faculty present in clinic. The length of the visit was approximately 15 minutes with >50% of the time dedicated to the discussion of the diagnosis and plan moving forward.    [id number] Thayer Ohm 09/22/2016, 08:41  Physician Assistant Certified  [facility]    I was not present for this patient encounter.  I have reviewed the chief complaint, history, physical examination, laboratory values, imaging reports, and have discussed the plan of care for this patient as  outlined by the physician assistant above.  Please see the physician assistant's note, which I have carefully reviewed, for full details.  Any additions/exceptions are edited/noted.    Illene Silver, MD 09/22/2016 11:21  Associate Professor of Surgery  Division of [facility]

## 2016-09-28 ENCOUNTER — Ambulatory Visit: Payer: Medicare Other | Attending: INTERNAL MEDICINE

## 2016-09-28 DIAGNOSIS — I4891 Unspecified atrial fibrillation: Secondary | ICD-10-CM | POA: Insufficient documentation

## 2016-09-28 LAB — PT/INR
INR: 2.34
PROTHROMBIN TIME: 25.7 s — ABNORMAL HIGH (ref 9.4–12.5)

## 2016-09-29 ENCOUNTER — Ambulatory Visit (INDEPENDENT_AMBULATORY_CARE_PROVIDER_SITE_OTHER): Payer: Self-pay | Admitting: INTERNAL MEDICINE

## 2016-09-29 NOTE — Progress Notes (Signed)
09/29/16 0900   [facility] PT/INR   Date INR drawn 09/29/16   INR 2.3   New warfarin dosage Fine. Keep the same, check in one month   Comments [facility]   Anticoag Indication VALVE   Goal INR 2.5-3.5

## 2016-10-01 ENCOUNTER — Telehealth (INDEPENDENT_AMBULATORY_CARE_PROVIDER_SITE_OTHER): Payer: Self-pay | Admitting: INTERNAL MEDICINE

## 2016-10-01 NOTE — Telephone Encounter (Signed)
Patient called and was given INR results and verbalized understood. Scheduled appt for next month

## 2016-11-09 ENCOUNTER — Ambulatory Visit: Payer: Medicare Other | Attending: INTERNAL MEDICINE

## 2016-11-09 DIAGNOSIS — Z7901 Long term (current) use of anticoagulants: Principal | ICD-10-CM | POA: Insufficient documentation

## 2016-11-09 LAB — PT/INR
INR: 2.78
INR: 2.78
PROTHROMBIN TIME: 30.6 s — ABNORMAL HIGH (ref 9.4–12.5)

## 2016-11-10 ENCOUNTER — Ambulatory Visit (INDEPENDENT_AMBULATORY_CARE_PROVIDER_SITE_OTHER): Payer: Self-pay | Admitting: INTERNAL MEDICINE

## 2016-11-10 NOTE — Progress Notes (Signed)
Pt notified of INR results and verbalized understanding. Pt has F/U lab appt on 12/08/2016.  Pt takes 7.5mg  every day except on Monday and Friday he takes 10mg .  [name], CMA  11/10/2016, 09:41

## 2016-11-10 NOTE — Progress Notes (Signed)
11/10/16 0800   [facility] PT/INR   Date INR drawn 11/10/16   INR 2.8   New warfarin dosage INR is at goal   New weekly dosgae Keep the same   Comments Check INR in one month   Initials [facility]   Anticoag Indication VALVE   Goal INR 2.5-3.5

## 2016-12-07 ENCOUNTER — Ambulatory Visit (INDEPENDENT_AMBULATORY_CARE_PROVIDER_SITE_OTHER): Payer: Self-pay | Admitting: INTERNAL MEDICINE

## 2016-12-07 ENCOUNTER — Ambulatory Visit: Payer: Medicare Other | Attending: INTERNAL MEDICINE

## 2016-12-07 ENCOUNTER — Ambulatory Visit (INDEPENDENT_AMBULATORY_CARE_PROVIDER_SITE_OTHER): Payer: Medicare Other | Admitting: INTERNAL MEDICINE

## 2016-12-07 ENCOUNTER — Encounter (INDEPENDENT_AMBULATORY_CARE_PROVIDER_SITE_OTHER): Payer: Self-pay | Admitting: INTERNAL MEDICINE

## 2016-12-07 VITALS — BP 150/110 | HR 78 | Temp 98.0°F | Ht 75.5 in | Wt 291.0 lb

## 2016-12-07 DIAGNOSIS — Z7901 Long term (current) use of anticoagulants: Principal | ICD-10-CM | POA: Insufficient documentation

## 2016-12-07 DIAGNOSIS — R0981 Nasal congestion: Secondary | ICD-10-CM

## 2016-12-07 DIAGNOSIS — Z952 Presence of prosthetic heart valve: Secondary | ICD-10-CM | POA: Insufficient documentation

## 2016-12-07 LAB — PT/INR
INR: 2.74
PROTHROMBIN TIME: 30.1 s — ABNORMAL HIGH (ref 9.4–12.5)

## 2016-12-07 MED ORDER — FLUTICASONE PROPIONATE 50 MCG/ACTUATION NASAL SPRAY,SUSPENSION
1.0000 | Freq: Every day | NASAL | 11 refills | Status: DC
Start: 2016-12-07 — End: 2017-02-11
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
## 2016-12-07 NOTE — Progress Notes (Signed)
12/07/16 1600   [facility] PT/INR   Date INR drawn 12/07/16   INR 2.7   New warfarin dosage Kep the same    New weekly dosgae Check INR in one month   Initials [facility]   Anticoag Indication Valve   Goal INR 2.5-3.5

## 2016-12-07 NOTE — Nursing Note (Signed)
BP (!) 150/110  Pulse 78  Temp 36.7 C (98 F)  Ht 1.918 m (6' 3.5")  Wt 132 kg (291 lb)  SpO2 97%  BMI 35.89 kg/m2  Kenneth Weeks, [place]  12/07/2016, 13:30

## 2016-12-08 ENCOUNTER — Other Ambulatory Visit: Payer: Self-pay

## 2016-12-08 NOTE — Progress Notes (Signed)
Pt notified of INR results and verbalized understanding. Pt has F/U lab appt on 01/07/2017.  Pt takes 10mg  mon and Friday and 7.5mg  ROW.  [name], CMA  12/08/2016, 08:57

## 2016-12-08 NOTE — Progress Notes (Signed)
Left VM for Pt to call the office.  [name], CMA  12/08/2016, 08:44

## 2016-12-14 ENCOUNTER — Ambulatory Visit (HOSPITAL_BASED_OUTPATIENT_CLINIC_OR_DEPARTMENT_OTHER)
Admission: RE | Admit: 2016-12-14 | Discharge: 2016-12-14 | Disposition: A | Discharge status: Home / Self Care | Payer: Medicare Other | Source: Ambulatory Visit | Attending: Student in an Organized Health Care Education/Training Program | Admitting: Student in an Organized Health Care Education/Training Program

## 2016-12-14 ENCOUNTER — Ambulatory Visit
Admission: RE | Admit: 2016-12-14 | Discharge: 2016-12-14 | Disposition: A | Discharge status: Home / Self Care | Payer: Medicare Other | Source: Ambulatory Visit | Attending: Student in an Organized Health Care Education/Training Program | Admitting: Student in an Organized Health Care Education/Training Program

## 2016-12-14 ENCOUNTER — Ambulatory Visit (HOSPITAL_BASED_OUTPATIENT_CLINIC_OR_DEPARTMENT_OTHER): Payer: Medicare Other | Admitting: Student in an Organized Health Care Education/Training Program

## 2016-12-14 ENCOUNTER — Ambulatory Visit (HOSPITAL_BASED_OUTPATIENT_CLINIC_OR_DEPARTMENT_OTHER): Payer: Medicare Other

## 2016-12-14 ENCOUNTER — Encounter (HOSPITAL_BASED_OUTPATIENT_CLINIC_OR_DEPARTMENT_OTHER): Payer: Self-pay | Admitting: Student in an Organized Health Care Education/Training Program

## 2016-12-14 VITALS — BP 160/100 | HR 67 | Temp 97.4°F | Resp 18 | Ht 75.5 in | Wt 294.3 lb

## 2016-12-14 DIAGNOSIS — K7460 Unspecified cirrhosis of liver: Secondary | ICD-10-CM | POA: Insufficient documentation

## 2016-12-14 DIAGNOSIS — K8020 Calculus of gallbladder without cholecystitis without obstruction: Secondary | ICD-10-CM

## 2016-12-14 DIAGNOSIS — I10 Essential (primary) hypertension: Secondary | ICD-10-CM

## 2016-12-14 DIAGNOSIS — C189 Malignant neoplasm of colon, unspecified: Secondary | ICD-10-CM

## 2016-12-14 DIAGNOSIS — I4891 Unspecified atrial fibrillation: Secondary | ICD-10-CM | POA: Insufficient documentation

## 2016-12-14 DIAGNOSIS — Z6836 Body mass index (BMI) 36.0-36.9, adult: Secondary | ICD-10-CM | POA: Insufficient documentation

## 2016-12-14 DIAGNOSIS — C182 Malignant neoplasm of ascending colon: Secondary | ICD-10-CM

## 2016-12-14 DIAGNOSIS — I509 Heart failure, unspecified: Secondary | ICD-10-CM | POA: Insufficient documentation

## 2016-12-14 DIAGNOSIS — M7989 Other specified soft tissue disorders: Secondary | ICD-10-CM

## 2016-12-14 DIAGNOSIS — Z7901 Long term (current) use of anticoagulants: Secondary | ICD-10-CM | POA: Insufficient documentation

## 2016-12-14 DIAGNOSIS — Z79899 Other long term (current) drug therapy: Secondary | ICD-10-CM | POA: Insufficient documentation

## 2016-12-14 DIAGNOSIS — R635 Abnormal weight gain: Secondary | ICD-10-CM | POA: Insufficient documentation

## 2016-12-14 DIAGNOSIS — R17 Unspecified jaundice: Secondary | ICD-10-CM

## 2016-12-14 DIAGNOSIS — R0609 Other forms of dyspnea: Secondary | ICD-10-CM | POA: Insufficient documentation

## 2016-12-14 DIAGNOSIS — I340 Nonrheumatic mitral (valve) insufficiency: Secondary | ICD-10-CM

## 2016-12-14 DIAGNOSIS — E119 Type 2 diabetes mellitus without complications: Secondary | ICD-10-CM | POA: Insufficient documentation

## 2016-12-14 DIAGNOSIS — Z952 Presence of prosthetic heart valve: Secondary | ICD-10-CM | POA: Insufficient documentation

## 2016-12-14 DIAGNOSIS — E1165 Type 2 diabetes mellitus with hyperglycemia: Secondary | ICD-10-CM

## 2016-12-14 DIAGNOSIS — E038 Other specified hypothyroidism: Secondary | ICD-10-CM | POA: Insufficient documentation

## 2016-12-14 DIAGNOSIS — R0602 Shortness of breath: Secondary | ICD-10-CM

## 2016-12-14 DIAGNOSIS — Z79890 Hormone replacement therapy (postmenopausal): Secondary | ICD-10-CM | POA: Insufficient documentation

## 2016-12-14 LAB — CARCINOEMBRYONIC ANTIGEN
CEA: 2.2 ng/mL (ref <=5.0)
CEA: 2.2 ng/mL (ref <=5.0)

## 2016-12-14 LAB — URINALYSIS, MACROSCOPIC
BILIRUBIN: NEGATIVE mg/dL
BLOOD: NEGATIVE mg/dL
COLOR: NORMAL
GLUCOSE: NEGATIVE mg/dL
KETONES: NEGATIVE mg/dL
LEUKOCYTES: NEGATIVE WBCs/uL
NITRITE: NEGATIVE
PH: 5 (ref 5.0–8.0)
PROTEIN: NEGATIVE mg/dL
SPECIFIC GRAVITY: 1.011 (ref 1.005–1.030)
SPECIFIC GRAVITY: 1.011 (ref 1.005–1.030)
UROBILINOGEN: 2 mg/dL — AB

## 2016-12-14 LAB — COMPREHENSIVE METABOLIC PANEL, NON-FASTING
ALBUMIN: 3.3 g/dL — ABNORMAL LOW (ref 3.4–4.8)
ALKALINE PHOSPHATASE: 65 U/L (ref <150)
ALT (SGPT): 20 U/L (ref <55)
ANION GAP: 7 mmol/L (ref 4–13)
AST (SGOT): 23 U/L (ref 8–48)
BILIRUBIN TOTAL: 2.6 mg/dL — ABNORMAL HIGH (ref 0.3–1.3)
BUN/CREA RATIO: 12 (ref 6–22)
BUN: 13 mg/dL (ref 8–25)
CALCIUM: 9.2 mg/dL (ref 8.5–10.2)
CHLORIDE: 104 mmol/L (ref 96–111)
CO2 TOTAL: 26 mmol/L (ref 22–32)
CREATININE: 1.07 mg/dL (ref 0.62–1.27)
ESTIMATED GFR: >59 mL/min/1.73mˆ2 (ref >59)
GLUCOSE: 120 mg/dL (ref 65–139)
POTASSIUM: 4.5 mmol/L (ref 3.5–5.1)
POTASSIUM: 4.5 mmol/L (ref 3.5–5.1)
PROTEIN TOTAL: 7.1 g/dL (ref 6.0–8.0)
SODIUM: 137 mmol/L (ref 136–145)

## 2016-12-14 LAB — CBC WITH DIFF
BASOPHIL #: 0.04 10*3/uL (ref 0.00–0.20)
BASOPHIL %: 1 %
EOSINOPHIL #: 0.03 10*3/uL (ref 0.00–0.50)
EOSINOPHIL %: 1 %
HCT: 41.7 % (ref 36.7–47.0)
HGB: 14 g/dL (ref 12.5–16.3)
LYMPHOCYTE #: 0.69 x10ˆ3/uL — ABNORMAL LOW (ref 1.00–4.80)
LYMPHOCYTE %: 10 %
MCH: 32.3 pg (ref 27.4–33.0)
MCHC: 33.7 g/dL (ref 32.5–35.8)
MCV: 96 fL (ref 78.0–100.0)
MONOCYTE #: 0.36 10*3/uL (ref 0.30–1.00)
MONOCYTE %: 5 %
MPV: 9.2 fL (ref 7.5–11.5)
NEUTROPHIL #: 5.69 10*3/uL (ref 1.50–7.70)
NEUTROPHIL %: 84 %
PLATELETS: 149 10*3/uL (ref 140–450)
RBC: 4.34 10*6/uL (ref 4.06–5.63)
RDW: 15.1 % — ABNORMAL HIGH (ref 12.0–15.0)
WBC: 6.8 10*3/uL (ref 3.5–11.0)
WBC: 6.8 x10?3/uL (ref 3.5–11.0)

## 2016-12-14 LAB — URINALYSIS, MICROSCOPIC
RBCS: <1 /hpf (ref <6.0)
WBCS: 0 /hpf (ref <4.0)

## 2016-12-14 LAB — BILIRUBIN, CONJUGATED (DIRECT): BILIRUBIN DIRECT: 1.2 mg/dL — ABNORMAL HIGH (ref <0.3)

## 2016-12-14 LAB — PLATELETS AND ANC CANCER CENTER
PLATELET COUNT (AUTO): 149 x10ˆ3/uL (ref 140–450)
PMN ABS (AUTO): 5.69 x10ˆ3/uL (ref 1.50–7.70)

## 2016-12-14 MED ORDER — FUROSEMIDE 40 MG TABLET
40.0000 mg | ORAL_TABLET | Freq: Every day | ORAL | 1 refills | Status: DC
Start: 2016-12-14 — End: 2017-02-19

## 2016-12-14 MED ORDER — SODIUM CHLORIDE 0.9 % (FLUSH) INJECTION SYRINGE
2.00 mL | INJECTION | INTRAMUSCULAR | Status: DC | PRN
Start: 2016-12-14 — End: 2016-12-15

## 2016-12-14 MED ORDER — AMOXICILLIN 875 MG-POTASSIUM CLAVULANATE 125 MG TABLET: 1 | Tab | Freq: Two times a day (BID) | ORAL | 0 refills | 0 days | Status: AC

## 2016-12-14 MED ORDER — PERFLUTREN LIPID MICROSPHERES 1.1 MG/ML INTRAVENOUS SUSPENSION
0.30 mL | INTRAVENOUS | Status: AC
Start: 2016-12-14 — End: 2016-12-14
  Administered 2016-12-14: 0.3 mL via INTRAVENOUS

## 2016-12-14 NOTE — Cancer Center Note (Signed)
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 given stability of CT findings and mild enhanced metabolic activity.  -Diet, Exercise encouraged, Vit D recommended  -Follow up every 3 months for 1st 2 years and then every 6 months during years 3, 4 and 5.     Significant weight Gain and DOE.   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  - Repeat ECHO.  - will restart Lasix 40 mg daily and watch renal function. S creatinine is 1.07 today.     LLE Swelling:   - Patient has been on  Coumadin for Mechanical aortic valve and INR has been therapeutic.   - will order Bilateral LE doppler to rule out DVT.  - Skin breakdowns noticed.  - will start Augmentin 875 BID for 10 days for suspected cellulitis.   - Patient was advised to keep the legs elevated.     Uncontrolled DM type 2  -Currently controlled by diet only, previously on Metformin  -Follows with PCP     Incisional Hernia  -Evaluated by Dr. Catarina Hartshorn 02/2016  for consideration of hernia  repair.  -He was felt to be not a candidate for elective repair based on his elevated A1c level, his Marfan's disease and his obesity.  -Follow up in 6 months with repeat scans recommended.    Cirrhosis of Liver  -AST/ALT is normal but T bilirubin trending upto 2.6  -[place] RUQ 01/24/2015 showed no evidence to suggest cirrhosis. Previous US Liver in 06/2014 reported cirrhosis of liver. He has no  clinical stigmata of cirrhosis  -Will have him follow with GI hepatology for cirrhosis if needed    Mechanical aortic valve (1999)   -on anticoagulation, coumadin managed by PCP. INR today is 1.92  -on coumadin 7.5 mg daily.    HTN:   -BP borderline high at 160/100.   -Sees his PCP May 21st      RTC in 3 months or sooner if CT scans is concerning for recurrence.         The patient was seen, examined, and discussed with attending faculty, Dr.Yohannes Waibel, [facility]      Elisabeth Most MD  Hematology / Oncology PGY V  Pager# [phone]      12/14/2016  I saw and examined the patient.  I reviewed the fellow's note.  I agree with the findings and plan of care as documented in the fellow's note.  Any exceptions/additions are edited/noted.    Dopplers neg for DVT.  ECHO completed showed LVEF 45%. MIdly depressed RV systolic dysfunction suggestive of Pulm HTN. Referral to cardiology placed.  RUQ US showed normal liver and partially imaged right pleural effusion. CT CAP ordered.    Ardeth Sportsman, MD

## 2016-12-14 NOTE — Nurses Notes (Signed)
1117- Pt admitted to VAD/Triage Area for labs. Labs drawn via straight stick. Pt tolerated well. Bleeding stopped with pressure. Site without complications. 2X2 gauze and paper tape dressing applied. Pt left VAD/Triage Area in good condition. Pt ambulatory to second floor for MD visit. Collins Scotland, RN

## 2016-12-17 ENCOUNTER — Telehealth (HOSPITAL_BASED_OUTPATIENT_CLINIC_OR_DEPARTMENT_OTHER): Payer: Self-pay | Admitting: Student in an Organized Health Care Education/Training Program

## 2016-12-17 NOTE — Telephone Encounter (Signed)
Dr. Landry Corporal ordered CT scan for pt. It is scheduled for Tuesday 12/22/16 @ 11, arrive @ 9. I called and updated pt. He is having labs done tomorrow at [place], RN  12/17/2016, 10:10

## 2016-12-18 ENCOUNTER — Ambulatory Visit: Payer: Medicare Other | Attending: INTERNAL MEDICINE

## 2016-12-18 ENCOUNTER — Other Ambulatory Visit (HOSPITAL_BASED_OUTPATIENT_CLINIC_OR_DEPARTMENT_OTHER): Payer: Self-pay | Admitting: Student in an Organized Health Care Education/Training Program

## 2016-12-18 DIAGNOSIS — E8770 Fluid overload, unspecified: Secondary | ICD-10-CM

## 2016-12-18 DIAGNOSIS — K7460 Unspecified cirrhosis of liver: Secondary | ICD-10-CM | POA: Insufficient documentation

## 2016-12-18 DIAGNOSIS — R931 Abnormal findings on diagnostic imaging of heart and coronary circulation: Secondary | ICD-10-CM

## 2016-12-18 LAB — TRANSTHORACIC ECHOCARDIOGRAM - ADULT
AV mean gradient: 27
AV peak velocity post stress: 335
Biplane Simpson EF: 45
EF: 43
Interventricular Septum Diastolic Thickness by 2D: 0.8 cm
LA Volume Index: 81
LVIDD - 2D: 6.2 cm
LVIDS 2D: 5.4 cm
LVPWD: 1 cm
TR VELOCITY: 321

## 2016-12-18 LAB — COMPREHENSIVE METABOLIC PANEL, NON-FASTING
ALBUMIN/GLOBULIN RATIO: 1.2 (ref 0.8–2.0)
ALBUMIN: 3.7 g/dL (ref 3.5–5.0)
ALKALINE PHOSPHATASE: 66 U/L (ref 38–126)
ALT (SGPT): 19 U/L (ref 17–63)
ANION GAP: 4 mmol/L (ref 3–11)
ANION GAP: 4 mmol/L (ref 3–11)
AST (SGOT): 27 U/L (ref 15–41)
BILIRUBIN TOTAL: 1.8 mg/dL — ABNORMAL HIGH (ref 0.3–1.2)
BUN/CREA RATIO: 12 (ref 6–22)
BUN: 15 mg/dL (ref 6–20)
CALCIUM: 8.9 mg/dL (ref 8.8–10.2)
CHLORIDE: 102 mmol/L (ref 101–111)
CO2 TOTAL: 30 mmol/L (ref 22–32)
CREATININE: 1.22 mg/dL (ref 0.61–1.24)
ESTIMATED GFR: 59 mL/min/{1.73_m2} — ABNORMAL LOW (ref >60)
GLUCOSE: 119 mg/dL — ABNORMAL HIGH (ref 70–110)
POTASSIUM: 4.6 mmol/L (ref 3.4–5.1)
PROTEIN TOTAL: 6.8 g/dL (ref 6.4–8.3)
SODIUM: 136 mmol/L (ref 136–145)

## 2016-12-18 LAB — BILIRUBIN, CONJUGATED (DIRECT): BILIRUBIN DIRECT: 0.5 mg/dL (ref 0.1–0.5)

## 2016-12-21 ENCOUNTER — Telehealth (HOSPITAL_BASED_OUTPATIENT_CLINIC_OR_DEPARTMENT_OTHER): Payer: Self-pay | Admitting: Student in an Organized Health Care Education/Training Program

## 2016-12-21 NOTE — Telephone Encounter (Addendum)
I called heart and vascular scheduling. They will not schedule the patient until "cardiology referral nurse" reviews the chart. I requested the referral nurse's contact information but it was not given to me. Scheduler informed me they would send referral nurse a message about possibly getting pt on for tomorrow. Pt travels from far away and already has a scan scheduled for tomorrow.     I also called pt with an update. I asked if he had a cardiologist at home. He does not currently have a cardiologist. If [facility] cardiologist can not get him in tomorrow, he mentioned his PCP may be able to refer him to someone in his area. Doristine Bosworth, RN  12/21/2016, 08:37      ----- Message from Ardeth Sportsman, MD sent at 12/18/2016  7:00 PM EST -----  His ECHO is abnormal. He has scans scheduled 11/13. Could we try to get him in with cardiology on the same day.

## 2016-12-22 ENCOUNTER — Ambulatory Visit
Admission: RE | Admit: 2016-12-22 | Discharge: 2016-12-22 | Disposition: A | Discharge status: Home / Self Care | Payer: Medicare Other | Source: Ambulatory Visit | Attending: Student in an Organized Health Care Education/Training Program | Admitting: Student in an Organized Health Care Education/Training Program

## 2016-12-22 ENCOUNTER — Other Ambulatory Visit (HOSPITAL_BASED_OUTPATIENT_CLINIC_OR_DEPARTMENT_OTHER): Payer: Self-pay | Admitting: Student in an Organized Health Care Education/Training Program

## 2016-12-22 DIAGNOSIS — K7460 Unspecified cirrhosis of liver: Secondary | ICD-10-CM | POA: Insufficient documentation

## 2016-12-22 DIAGNOSIS — C189 Malignant neoplasm of colon, unspecified: Secondary | ICD-10-CM | POA: Insufficient documentation

## 2016-12-22 DIAGNOSIS — R590 Localized enlarged lymph nodes: Secondary | ICD-10-CM

## 2016-12-22 DIAGNOSIS — Z9049 Acquired absence of other specified parts of digestive tract: Secondary | ICD-10-CM | POA: Insufficient documentation

## 2016-12-22 DIAGNOSIS — K8020 Calculus of gallbladder without cholecystitis without obstruction: Secondary | ICD-10-CM

## 2016-12-22 DIAGNOSIS — J90 Pleural effusion, not elsewhere classified: Secondary | ICD-10-CM | POA: Insufficient documentation

## 2016-12-22 DIAGNOSIS — R918 Other nonspecific abnormal finding of lung field: Secondary | ICD-10-CM | POA: Insufficient documentation

## 2016-12-22 DIAGNOSIS — R591 Generalized enlarged lymph nodes: Secondary | ICD-10-CM | POA: Insufficient documentation

## 2016-12-22 DIAGNOSIS — K769 Liver disease, unspecified: Secondary | ICD-10-CM

## 2016-12-22 DIAGNOSIS — Z9889 Other specified postprocedural states: Secondary | ICD-10-CM

## 2016-12-22 DIAGNOSIS — N400 Enlarged prostate without lower urinary tract symptoms: Secondary | ICD-10-CM | POA: Insufficient documentation

## 2016-12-22 DIAGNOSIS — J984 Other disorders of lung: Secondary | ICD-10-CM

## 2016-12-22 MED ORDER — DIATRIZOATE MEGLUMINE-DIATRIZOATE SODIUM 66 %-10 % ORAL SOLUTION
8.00 mL | ORAL | Status: AC
Start: 2016-12-22 — End: 2016-12-22
  Administered 2016-12-22: 8 mL via ORAL

## 2016-12-22 MED ORDER — IOPAMIDOL 300 MG IODINE/ML (61 %) INTRAVENOUS SOLUTION
100.00 mL | INTRAVENOUS | Status: AC
Start: 2016-12-22 — End: 2016-12-22
  Administered 2016-12-22 (×2): 100 mL via INTRAVENOUS

## 2016-12-23 ENCOUNTER — Other Ambulatory Visit (HOSPITAL_BASED_OUTPATIENT_CLINIC_OR_DEPARTMENT_OTHER): Payer: Self-pay

## 2016-12-23 ENCOUNTER — Ambulatory Visit (HOSPITAL_BASED_OUTPATIENT_CLINIC_OR_DEPARTMENT_OTHER): Payer: Self-pay | Admitting: Student in an Organized Health Care Education/Training Program

## 2016-12-23 NOTE — Telephone Encounter (Addendum)
I called the number below to refer pt to cardiology. I was informed to send pt information to Fax # [phone]. I faxed face sheet, most recent labs, Dr. Dianah Field note, Echo and Dopplers from 12/16/16 and past ECHO from 2016 to that fax number and requested ASAP appointment. Fax confirmation was received.  Doristine Bosworth, RN  12/23/2016, 14:40      ----- Message from Dessie Coma sent at 12/23/2016  8:42 AM EST -----  Kenneth Weeks pt  Pt states you want him to see a cardiologist soon, he would like to see Dr.Neal Gaither in [place] and can not be seen until after Christmas. Can you refer him to Dr.Gaither and try and get him a sooner appt? Please call [phone]

## 2016-12-28 ENCOUNTER — Telehealth: Payer: Self-pay

## 2016-12-28 NOTE — Telephone Encounter (Signed)
PCP records are located in Care Everywhere.

## 2016-12-29 ENCOUNTER — Ambulatory Visit (HOSPITAL_BASED_OUTPATIENT_CLINIC_OR_DEPARTMENT_OTHER): Payer: Self-pay | Admitting: Student in an Organized Health Care Education/Training Program

## 2016-12-29 NOTE — Telephone Encounter (Signed)
-----   Message from Mack Guise sent at 12/28/2016 11:11 AM EST -----  [facility] pt    [facility] is calling to report that pt is scheduled to be seen by them on Wednesday, 12/30/2016 at 11:00am.     Thank you.

## 2016-12-30 ENCOUNTER — Ambulatory Visit: Payer: Self-pay | Admitting: Interventional Cardiology

## 2016-12-30 ENCOUNTER — Other Ambulatory Visit (HOSPITAL_BASED_OUTPATIENT_CLINIC_OR_DEPARTMENT_OTHER): Payer: Self-pay

## 2016-12-30 DIAGNOSIS — C189 Malignant neoplasm of colon, unspecified: Secondary | ICD-10-CM

## 2017-01-04 ENCOUNTER — Ambulatory Visit (HOSPITAL_BASED_OUTPATIENT_CLINIC_OR_DEPARTMENT_OTHER): Payer: Medicare Other | Admitting: Student in an Organized Health Care Education/Training Program

## 2017-01-07 ENCOUNTER — Ambulatory Visit: Payer: Medicare Other | Attending: INTERNAL MEDICINE

## 2017-01-07 DIAGNOSIS — I4891 Unspecified atrial fibrillation: Secondary | ICD-10-CM | POA: Insufficient documentation

## 2017-01-07 LAB — PT/INR
INR: 3.32
PROTHROMBIN TIME: 36.5 seconds — ABNORMAL HIGH (ref 9.4–12.5)

## 2017-01-08 ENCOUNTER — Encounter (INDEPENDENT_AMBULATORY_CARE_PROVIDER_SITE_OTHER): Payer: Self-pay | Admitting: Cardiovascular Disease

## 2017-01-08 ENCOUNTER — Ambulatory Visit (INDEPENDENT_AMBULATORY_CARE_PROVIDER_SITE_OTHER): Payer: Self-pay | Admitting: INTERNAL MEDICINE

## 2017-01-08 NOTE — Progress Notes (Signed)
Pt notified of INR results and verbalized understanding. Pt has F/U lab appt on 02/04/2017.  [name], CMA  01/08/2017, 15:06

## 2017-01-08 NOTE — Nursing Note (Signed)
Referred by Dr. Landry Corporal at [facility] for a cardiology consult due to Abnormal Echo.  He has a cardiac history of A-Fib s/p DCCV, Diastolic CHF secondary to A-Fib, AVR, and HTN.  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    --------    Hx of DCCV    AVR 1999  1. St. Judes mechanical prosthetic AV.    Echo 07/11/14  1. Left ventricle: The LV systolic function is normal. LV ejection fraction is 55% - 60%. LV diastolic function is unable to be assessed. The study was not technically sufficient to allow evaluation of LV diastolic dysfunction due to atrial fibrillation. There was no evidence of increased ventricular filling pressure by Doppler parameters.  2. Tricuspid valve: Mild-moderate tricuspid valve regurgitation.  3. Left atrium: The left atrial size is moderately dilated.    CT Chest/Abd./Pelvis 06/12/16  1.  New area of groundglass opacity in the right upper lobe with a nodular more solid appearing component measuring 9 mm. This appearance is suspicious for malignancy, although localized infection may have a similar appearance.  2.  Increased size of paratracheal lymph node, now measuring 2.4 cm. While this retains benign characteristics, metastatic disease is not excluded.   3.  Unchanged size of multiple tiny pulmonary nodules.  4.  Unchanged size of multiple prominent right cervical lymph nodes.  5.  Cholelithiasis without evidence of cholecystitis. Sigmoid diverticulosis without specific evidence of diverticulitis.  6.  New ventral and periumbilical hernias containing loops of small bowel without evidence of strangulation.    Echo 12/14/16  1. Mildly dilated left ventricle. Mildly depressed left ventricular systolic function. LV Ejection Fraction is 45 %. Normal  geometry. Left ventricular diastolic function could not be assessed due to the presence of atrial fibrillation during the study.  2. Resting Segmental Wall Motion Analysis: Total wall motion score is 2.00. There is global left ventricular hypokinesis.  3. Moderately dilated right ventricle. Mildly depressed right ventricular systolic function. RV systolic pressure is consistent with at least mild-to-moderate pulmonary hypertension (TR peak gradient 41 mmHg). RA pressure could not be assessed as the IVC is not well visualized.  4. Severely dilated left atrium.  5. Moderately dilated right atrium.  6. There is mild primary mitral valve regurgitation.  7. A mechanical prosthetic valve is present in the aortic position. The aortic valve prosthesis appears well seated. The aortic prosthesis demonstrates a moderately increased transvalvular gradient for valve type and size. This is suggestive of moderate prosthetic aortic stenosis.    CT Chest/Abd./Pelvis 12/22/16  1.  Mosaic groundglass attenuation pattern in the lungs bilaterally. Findings are nonspecific but could represent small airways disease. However, follow-up is recommended to monitor resolution.  2.  Mild to moderate bilateral pleural effusions, new from prior study.   3.  Mediastinal, periaortic and inguinal lymphadenopathy, most are stable in size. However, few of them have increased in size comparing to the study dated on May 2018.  4.  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 No evidence of bowel obstruction  5.  Liver contour is mildly lobulated, which may represent fibrotic changes. Sub optimal evaluation for hepatoma due to delayed single phase exam.  6.  Cholelithiasis and prostatic enlargement.    -------    AS:  s/p St. Judes AVR in 1999.  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    Cardiomyopathy/Diastolic CHF:  EF 78-93% on 07/11/14 Echo and now EF 45%, mildly dilated LV, and global LV  hypokinesis on 12/14/16 Echo.  - h/o CHF secondary to A-Fib    A-Fib:  Hx of DCCV.  Echo 12/14/16- Presence of A-Fib, severely dilated LA, and moderately dilated RA.    Pulmonary HTN:  Moderately dilated RV and mild-to-moderate pulmonary HTN on 12/14/16 Echo.    HTN:      Lipids:  FLP 07/10/14- Total 74, Trig. 115, HDL 18, LDL 33    Nonsmoker

## 2017-01-08 NOTE — Progress Notes (Signed)
01/08/17 1200   [facility] PT/INR   Date INR drawn 01/08/17   INR 3.3   New warfarin dosage INR is at goal   New weekly dosgae Keep the same   Comments Check INR in one month   Initials [facility]   Anticoag Indication Valve   Goal INR 2.5-3.5

## 2017-01-09 ENCOUNTER — Other Ambulatory Visit: Payer: Self-pay

## 2017-01-11 ENCOUNTER — Ambulatory Visit (INDEPENDENT_AMBULATORY_CARE_PROVIDER_SITE_OTHER): Payer: Medicare Other | Admitting: Cardiovascular Disease

## 2017-01-11 ENCOUNTER — Encounter (INDEPENDENT_AMBULATORY_CARE_PROVIDER_SITE_OTHER): Payer: Self-pay | Admitting: Cardiovascular Disease

## 2017-01-11 VITALS — BP 140/93 | HR 100 | Resp 16 | Wt 297.8 lb

## 2017-01-11 DIAGNOSIS — I5030 Unspecified diastolic (congestive) heart failure: Secondary | ICD-10-CM

## 2017-01-11 DIAGNOSIS — E8770 Fluid overload, unspecified: Secondary | ICD-10-CM

## 2017-01-11 DIAGNOSIS — R931 Abnormal findings on diagnostic imaging of heart and coronary circulation: Secondary | ICD-10-CM

## 2017-01-11 DIAGNOSIS — Z952 Presence of prosthetic heart valve: Secondary | ICD-10-CM

## 2017-01-11 DIAGNOSIS — I272 Pulmonary hypertension, unspecified: Secondary | ICD-10-CM

## 2017-01-11 DIAGNOSIS — I4891 Unspecified atrial fibrillation: Secondary | ICD-10-CM

## 2017-01-11 DIAGNOSIS — I350 Nonrheumatic aortic (valve) stenosis: Principal | ICD-10-CM

## 2017-01-11 DIAGNOSIS — I10 Essential (primary) hypertension: Secondary | ICD-10-CM

## 2017-01-11 DIAGNOSIS — I429 Cardiomyopathy, unspecified: Secondary | ICD-10-CM

## 2017-01-11 DIAGNOSIS — I480 Paroxysmal atrial fibrillation: Secondary | ICD-10-CM

## 2017-01-11 MED ORDER — METOPROLOL SUCCINATE ER 50 MG TABLET,EXTENDED RELEASE 24 HR
50.0000 mg | ORAL_TABLET | Freq: Every day | ORAL | 0 refills | Status: DC
Start: 2017-01-11 — End: 2017-02-11

## 2017-01-11 NOTE — H&P (Signed)
[address] [place] [address]  [phone]    Date: 01/11/2017  Patient Name: Kenneth Weeks  MRN#: [id number]  DOB: 1950/07/04    Provider: Barnet Glasgow, MD  PCP: Viona Gilmore, MD      Reason for visit: Abnormal Echo    History:     Kenneth Weeks is a 66 y.o. male Referred by Dr. Landry Corporal at [facility] for a cardiology consult due to Abnormal Echo.  He has a cardiac history of A-Fib s/p DCCV (5-6 years ago after first diagnosed, he reports return to A Fib ~1 week later and he has been in A Fib ever since), apparently Marfan's Syndrome (but no h/o TAA aneurysm or dislocated eye lens), and is s/p AVR in [place] years ago on chronic Coumadin therapy.  No known h/o CAD.  He has had issues recently with weight gain and LE edema.  An Echo on 12-14-16 concluded an EF 45%, mildly dilated LV, global LV hypokinesis, presence of A-Fib, moderately dilated RV and mild-to-moderate pulmonary HTN, severely dilated LA and moderately dilated RA, mild primary MR, and mechanical prosthetic AV suggestive of moderate prosthetic AS.  He complains of recent SOB with minimal exertion (walking stairs).  He is being followed by oncology due to a history of bleeding polyp and stage I colon cancer. He has not had a sleep study in the past, however his wife has witnessed him snoring and stopping breathing when sleeping at times.  He was restarted on Lasix but stopped it for two weeks due to vomiting and diarrhea attributed to food poisoning, and has since restarted Lasix and has lost 4 lbs since yesterday.  He may have orthopnea, but this is hard to dissociate from post-nasal drip issues.  No c/o any CP, palpitations, syncope/presyncope, PND, or claudication.    He is married and lives in [place], [place].  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    Past Medical History:     Past Medical History:   Diagnosis Date    A-fib (CMS [facility])     Chronic  anti-coagulant therapy; Hx cardioversions    Abdominal hernia     Ventral hernia; Left Inguinal hernia    Acute on chronic renal failure (CMS [facility])     Biliary and gallbladder disorder     "Gallstones"    Cancer (CMS HCC)     Colon Cancer    Congestive heart failure (CMS HCC)     Diabetes mellitus (CMS HCC)     type ii diet controlled    Dysrhythmias     a fib    Hemorrhage of gastrointestinal tract 5/16    Hemorrhagic shock (CMS HCC)     Hepatic cirrhosis (CMS HCC)     Hydrocele     Hypertension 07/10/2014    Marfan syndrome     Muscle weakness     Generalized    Obesity     Syncope     Thyroid disease     hypothyroid    Transfusion history     s/p Multiple units PRC/FFP this adm.    Valvular disease     St Judes aortic valve replacement-chronic anti-coagulant therapy    Wears glasses          Past Surgical History:     Past Surgical History:   Procedure Laterality Date    COLONOSCOPY  HX COLONOSCOPY      HX HEART CATHETERIZATION      HX HEART VALVE SURGERY  1999    st judes     HX HEMICOLECTOMY Right 07/13/2014    Laparoscopic right hemicolectomy for colon cancer    HX TONSIL AND ADENOIDECTOMY      HX TONSILLECTOMY      HX UPPER ENDOSCOPY  07/09/14    HX WISDOM TEETH EXTRACTION           Allergies:     Allergies   Allergen Reactions    Allegra [Fexofenadine]  Other Adverse Reaction (Add comment)     "retain fluid"       Medications:     Current Outpatient Prescriptions   Medication Sig    cholecalciferol, vitamin D3, 1,000 unit Oral Tablet Take 1,000 Units by mouth Once a day    docusate sodium (COLACE) 100 mg Oral Capsule Take 1 Cap (100 mg total) by mouth Twice daily    fluticasone (FLONASE) 50 mcg/actuation Nasal Spray, Suspension 1 Spray by Each Nostril route Once a day    furosemide (LASIX) 40 mg Oral Tablet Take 1 Tab (40 mg total) by mouth Once a day    levothyroxine (SYNTHROID) 200 mcg Oral Tablet TAKE 1 TABLET BY MOUTH ONCE DAILY    MAGNESIUM CARBONATE ORAL Take by  mouth Once a day    POTASSIUM GLUCONATE ORAL Take by mouth Once a day    vitamin B complex Oral Tablet Take 1 Tab by mouth Once a day    warfarin (COUMADIN) 5 mg Oral Tablet Take 1.5 Tabs (7.5 mg total) by mouth Once a day     Family History:     Family Medical History:     Problem Relation (Age of Onset)    Cirrhosis Brother    Hypertension Mother    Lung Cancer Father        Social History:     Social History     Social History    Marital status: Married     Spouse name: N/A    Number of children: N/A    Years of education: N/A     Social History Main Topics    Smoking status: Never Smoker    Smokeless tobacco: Never Used    Alcohol use No    Drug use: No    Sexual activity: Not on file     Other Topics Concern    Ability To Walk 1 Flight Of Steps Without Sob/Cp Yes    Routine Exercise No    Ability To Walk 2 Flight Of Steps Without Sob/Cp Yes    Unable To Ambulate No    Total Care No    Ability To Do Own Adl's Yes    Uses Walker No    Other Activity Level No    Uses Cane No     Social History Narrative       Review of Systems:     All other systems were reviewed and are negative other than noted in the HPI.    Physical Exam:     Vitals:    01/11/17 1329   BP: (!) 140/93   Pulse: 100   Resp: 16   SpO2: 97%   Weight: 135.1 kg (297 lb 12.8 oz)     Body mass index is 36.73 kg/(m^2).      General: No acute distress, obese  HEENT: Moist mucous membranes  Neck: Supple, no JVD, no bruits  Heart/Cardiovascular:  Tachy, irregular  Soft systolic murmur  2+ radial pulses  Respiratory/Lungs: Non-labored, clear to auscultation  Abdominal/Gastrointestinal: Soft, non-tender  Extremities: Warm and well-perfused, chronic LE edema with skin thickening  Skin: No rash  Neurological: Alert and oriented x3, grossly nonfocal  Psych: Normal affect    Cardiovascular Workup:     Hx of DCCV    AVR 1999 ([place])  1. St. Judes mechanical prosthetic AV.    Echo 07/11/14  1. Left ventricle: The LV systolic function  is normal. LV ejection fraction is 55% - 60%. LV diastolic function is unable to be assessed. The study was not technically sufficient to allow evaluation of LV diastolic dysfunction due to atrial fibrillation. There was no evidence of increased ventricular filling pressure by Doppler parameters.  2. Tricuspid valve: Mild-moderate tricuspid valve regurgitation.  3. Left atrium: The left atrial size is moderately dilated.    CT Chest/Abd./Pelvis 06/12/16  1. New area of groundglass opacity in the right upper lobe with a nodular more solid appearing component measuring 9 mm. This appearance is suspicious for malignancy, although localized infection may have a similar appearance.  2. Increased size of paratracheal lymph node, now measuring 2.4 cm. While this retains benign characteristics, metastatic disease is not excluded.   3. Unchanged size of multiple tiny pulmonary nodules.  4. Unchanged size of multiple prominent right cervical lymph nodes.  5. Cholelithiasis without evidence of cholecystitis. Sigmoid diverticulosis without specific evidence of diverticulitis.  6. New ventral and periumbilical hernias containing loops of small bowel without evidence of strangulation.    Echo 12/14/16  1. Mildly dilated left ventricle. Mildly depressed left ventricular systolic function. LV Ejection Fraction is 45 %. Normal geometry. Left ventricular diastolic function could not be assessed due to the presence of atrial fibrillation during the study.  2. Resting Segmental Wall Motion Analysis: Total wall motion score is 2.00. There is global left ventricular hypokinesis.  3. Moderately dilated right ventricle. Mildly depressed right ventricular systolic function. RV systolic pressure is consistent with at least mild-to-moderate pulmonary hypertension (TR peak gradient 41 mmHg). RA pressure could not be assessed as the IVC is not well visualized.  4. Severely dilated left atrium.  5. Moderately dilated right atrium.  6.  There is mild primary mitral valve regurgitation.  7. A mechanical prosthetic valve is present in the aortic position. The aortic valve prosthesis appears well seated. The aortic prosthesis demonstrates a moderately increased transvalvular gradient for valve type and size. This is suggestive of moderate prosthetic aortic stenosis.    ECG today: A Fib RVR, PVC, nonspecific T wave changes    Assessment and Plan:     Kenneth Weeks is a 66 y.o. male with    AS:  s/p St. Judes AVR in 1999.  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  - c/o worsening SOB recently  - follow with Echos, next to be done in 6 months  - coumadin with goal INR 2.5-3.5  - reminded him of the need for antibiotic prophylaxis prior to dental procedures    Cardiomyopathy/Diastolic CHF:  EF 62-83% on 07/11/14 Echo and now EF 45%, mildly dilated LV, and global LV hypokinesis on 12/14/16 Echo.  - possibly due to recent elevated HRs  - start Toprol 50 mg daily  - continue Lasix  - SPECT to r/o ischemia given DOE    A-Fib:  Hx of DCCV.  Echo 12/14/16- Presence of A-Fib, severely dilated LA, and moderately dilated RA.  -  c/o worsening SOB recently  - CHA2DS2-VASc score of 3 (age + HTN + CMP/CHF)  - start Toprol-XL 50 mg QD  - continue Coumadin for stroke prophylaxis  - after 5 years of A Fib it is not likely that we will be able to successfully rhythm control him    Pulmonary HTN:  Moderately dilated RV and mild-to-moderate pulmonary HTN on 12/14/16 Echo.    HTN:  BP borderline today  - Toprol as above, re-assess next visit    Lipids:  FLP 07/10/14- Total 74, Trig. 115, HDL 18, LDL 33    ?OSA:  Witnessed snoring and apneic events  - discussed future sleep study    Nonsmoker    H/o Marfan's syndrome:  - unclear how diagnosed, he does not have typical features, no h/o ectopia lentis, aortic root normal in size on recent echo      Follow up in 3 weeks      Thank you for allowing me to participate in the care  of your patient. Please feel free to contact me if there are further questions.     I am scribing for, and in the presence of Dr. Barnet Glasgow, MD for services provided on 01/11/2017.  Reynolds Bowl, [place]  01/11/2017, 14:03    I personally performed the services described in this documentation, as scribed in my presence, and it is both accurate and complete.    Barnet Glasgow, MD    Barnet Glasgow, MD  01/11/2017, 14:12

## 2017-01-20 ENCOUNTER — Encounter (HOSPITAL_BASED_OUTPATIENT_CLINIC_OR_DEPARTMENT_OTHER): Payer: Self-pay

## 2017-01-20 ENCOUNTER — Ambulatory Visit (HOSPITAL_BASED_OUTPATIENT_CLINIC_OR_DEPARTMENT_OTHER): Payer: Medicare Other

## 2017-01-20 ENCOUNTER — Ambulatory Visit (HOSPITAL_BASED_OUTPATIENT_CLINIC_OR_DEPARTMENT_OTHER)
Admission: RE | Admit: 2017-01-20 | Discharge: 2017-01-20 | Disposition: A | Discharge status: Home / Self Care | Payer: Medicare Other | Source: Ambulatory Visit | Attending: Cardiovascular Disease | Admitting: Cardiovascular Disease

## 2017-01-20 DIAGNOSIS — I4891 Unspecified atrial fibrillation: Secondary | ICD-10-CM

## 2017-01-20 DIAGNOSIS — R29898 Other symptoms and signs involving the musculoskeletal system: Secondary | ICD-10-CM | POA: Insufficient documentation

## 2017-01-20 DIAGNOSIS — R9439 Abnormal result of other cardiovascular function study: Secondary | ICD-10-CM | POA: Insufficient documentation

## 2017-01-20 DIAGNOSIS — I517 Cardiomegaly: Secondary | ICD-10-CM | POA: Insufficient documentation

## 2017-01-20 MED ORDER — REGADENOSON 0.4 MG/5 ML INTRAVENOUS SYRINGE
0.40 mg | INJECTION | INTRAVENOUS | Status: AC
Start: 2017-01-20 — End: 2017-01-20
  Administered 2017-01-20: 0.4 mg via INTRAVENOUS

## 2017-02-04 ENCOUNTER — Ambulatory Visit: Payer: Medicare Other | Attending: INTERNAL MEDICINE

## 2017-02-04 DIAGNOSIS — I4891 Unspecified atrial fibrillation: Secondary | ICD-10-CM | POA: Insufficient documentation

## 2017-02-04 LAB — PT/INR
INR: 3.76
PROTHROMBIN TIME: 41.4 seconds — ABNORMAL HIGH (ref 9.4–12.5)

## 2017-02-10 ENCOUNTER — Telehealth (INDEPENDENT_AMBULATORY_CARE_PROVIDER_SITE_OTHER): Payer: Self-pay | Admitting: INTERNAL MEDICINE

## 2017-02-10 NOTE — Progress Notes (Signed)
Inr supratherapeutic.  Have pt hold coumadin today and start 1 tab (5mg ) tu, th and 1.5 tab (7.5 mg) the rest of the week.  Please update dose in med list.    Donia Pounds, MD  02/10/2017, 05:19

## 2017-02-10 NOTE — Progress Notes (Signed)
[address] [place] [address]  [phone]    Date: 02/11/2017  Patient Name: Kenneth Weeks  MRN#: [id number]  DOB: 1950-04-24    Provider: Barnet Glasgow, MD  PCP: Viona Gilmore, MD      Reason for visit: Aortic Stenosis and Leg Swelling    History:     Kenneth Weeks is a 67 y.o. male here today for a 1 month follow-up on his A-Fib, AS s/p AVR, Cardiomyopathy/CHF, and Pulmonary HTN.  He was last seen on 01-11-17, as a new patient, when he complained of recent SOB with minimal exertion, Toprol-XL 50 mg QD was started, and a nuclear stress test was ordered and done on 01-20-17 concluding no evidence of ischemia noted, septal akinesis and mild global hypokinesis with an EF of 36%, and a dilated LV, both at rest and stress.  Today he reports an episode of significant SOB when walking 0.4 miles up[place] while in [place] a couple weeks ago, requiring him to use a wheelchair throughout the day.  This is improved with rest.  He complains of worsening LE edema since he was last seen despite taking Lasix 40 mg daily.  He has had some weight gain as well.  He has noticed his HR has slowed down some since starting Metoprolol, although he has an elevated HR and BP today today in-office.  He admits to consuming hidden salt in his diet at lunch time.  No c/o any CP, palpitations, syncope/presyncope, orthopnea/PND, or claudication.    He is married and lives in [place], [place].  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    Past Medical History:     Past Medical History:   Diagnosis Date   . A-fib (CMS HCC)     Chronic anti-coagulant therapy; Hx cardioversions   . Abdominal hernia     Ventral hernia; Left Inguinal hernia   . Acute on chronic renal failure (CMS HCC)    . Biliary and gallbladder disorder     "Gallstones"   . Cancer (CMS [facility])     Colon Cancer   . Congestive heart failure (CMS HCC)    . Diabetes mellitus (CMS [facility])     type ii diet  controlled   . Dysrhythmias     a fib   . Hemorrhage of gastrointestinal tract 5/16   . Hemorrhagic shock (CMS HCC)    . Hepatic cirrhosis (CMS HCC)    . Hydrocele    . Hypertension 07/10/2014   . Marfan syndrome    . Muscle weakness     Generalized   . Obesity    . Syncope    . Thyroid disease     hypothyroid   . Transfusion history     s/p Multiple units PRC/FFP this adm.   . Valvular disease     St Judes aortic valve replacement-chronic anti-coagulant therapy   . Wears glasses          Past Surgical History:     Past Surgical History:   Procedure Laterality Date   . COLONOSCOPY     . HX COLONOSCOPY     . HX HEART CATHETERIZATION     . [facility]    st judes    . HX HEMICOLECTOMY Right 07/13/2014    Laparoscopic right hemicolectomy for colon cancer   . HX TONSIL AND ADENOIDECTOMY     .  HX TONSILLECTOMY     . HX UPPER ENDOSCOPY  07/09/14   . HX WISDOM TEETH EXTRACTION           Allergies:     Allergies   Allergen Reactions   . Allegra [Fexofenadine]  Other Adverse Reaction (Add comment)     "retain fluid"       Medications:     Current Outpatient Medications   Medication Sig   . cholecalciferol, vitamin D3, 1,000 unit Oral Tablet Take 1,000 Units by mouth Once a day   . docusate sodium (COLACE) 100 mg Oral Capsule Take 1 Cap (100 mg total) by mouth Twice daily   . eplerenone (INSPRA) 25 mg Oral Tablet Take 1 Tab (25 mg total) by mouth Once a day   . furosemide (LASIX) 40 mg Oral Tablet Take 1 Tab (40 mg total) by mouth Once a day   . levothyroxine (SYNTHROID) 200 mcg Oral Tablet TAKE 1 TABLET BY MOUTH ONCE DAILY   . lisinopril (PRINIVIL) 5 mg Oral Tablet Take 1 Tab (5 mg total) by mouth Once a day   . MAGNESIUM CARBONATE ORAL Take by mouth Once a day   . metoprolol succinate (TOPROL-XL) 100 mg Oral Tablet Sustained Release 24 hr Take 1 Tab (100 mg total) by mouth Once a day   . vitamin B complex Oral Tablet Take 1 Tab by mouth Once a day   . warfarin (COUMADIN) 5 mg Oral Tablet Take 1.5 Tabs (7.5 mg  total) by mouth Once a day (Patient taking differently: Take 7.5 mg by mouth Once a day To be adjusted by following provider)     Family History:     Family Medical History:     Problem Relation (Age of Onset)    Cirrhosis Brother    Hypertension Mother    Lung Cancer Father        Social History:     Social History     Socioeconomic History   . Marital status: Married     Spouse name: Not on file   . Number of children: Not on file   . Years of education: Not on file   . Highest education level: Not on file   Social Needs   . Financial resource strain: Not on file   . Food insecurity - worry: Not on file   . Food insecurity - inability: Not on file   . Transportation needs - medical: Not on file   . Transportation needs - non-medical: Not on file   Occupational History   . Not on file   Tobacco Use   . Smoking status: Never Smoker   . Smokeless tobacco: Never Used   Substance and Sexual Activity   . Alcohol use: No     Alcohol/week: 0.0 oz   . Drug use: No   . Sexual activity: Not on file   Other Topics Concern   . Ability to Walk 1 Flight of Steps without SOB/CP Yes   . Routine Exercise No   . Ability to Walk 2 Flight of Steps without SOB/CP Yes   . Unable to Ambulate No   . Total Care No   . Ability To Do Own ADL's Yes   . Uses Walker No   . Other Activity Level No   . Uses Cane No   Social History Narrative   . Not on file       Review of Systems:     All other systems were  reviewed and are negative other than noted in the HPI.    Physical Exam:     Vitals:    02/11/17 0956   BP: (!) 148/107   Pulse: (!) 108   Resp: 15   SpO2: 93%   Weight: 135.6 kg (299 lb)     Body mass index is 36.88 kg/m.    General: No acute distress, obese  HEENT: Moist mucous membranes  Neck: Supple, no JVD, no bruits  Heart/Cardiovascular:  Tachy, irregular  Soft systolic murmur  2+ radial pulses  Respiratory/Lungs: Non-labored, clear to auscultation  Abdominal/Gastrointestinal: Soft, non-tender  Extremities: Warm and well-perfused,  chronic LE edema with skin thickening  Skin: No rash  Neurological: Alert and oriented x3, grossly nonfocal  Psych: Normal affect    Cardiovascular Workup:     Hx of DCCV    AVR 1999 ([place])  1. St. Judes mechanical prosthetic AV.    Echo 07/11/14  1. Left ventricle: The LV systolic function is normal. LV ejection fraction is 55% - 60%. LV diastolic function is unable to be assessed. The study was not technically sufficient to allow evaluation of LV diastolic dysfunction due to atrial fibrillation. There was no evidence of increased ventricular filling pressure by Doppler parameters.  2. Tricuspid valve: Mild-moderate tricuspid valve regurgitation.  3. Left atrium: The left atrial size is moderately dilated.    CT Chest/Abd./Pelvis 06/12/16  1. New area of groundglass opacity in the right upper lobe with a nodular more solid appearing component measuring 9 mm. This appearance is suspicious for malignancy, although localized infection may have a similar appearance.  2. Increased size of paratracheal lymph node, now measuring 2.4 cm. While this retains benign characteristics, metastatic disease is not excluded.   3. Unchanged size of multiple tiny pulmonary nodules.  4. Unchanged size of multiple prominent right cervical lymph nodes.  5. Cholelithiasis without evidence of cholecystitis. Sigmoid diverticulosis without specific evidence of diverticulitis.  6. New ventral and periumbilical hernias containing loops of small bowel without evidence of strangulation.    Echo 12/14/16  1. Mildly dilated left ventricle. Mildly depressed left ventricular systolic function. LV Ejection Fraction is 45 %. Normal geometry. Left ventricular diastolic function could not be assessed due to the presence of atrial fibrillation during the study.  2. Resting Segmental Wall Motion Analysis: Total wall motion score is 2.00. There is global left ventricular hypokinesis.  3. Moderately dilated right ventricle. Mildly  depressed right ventricular systolic function. RV systolic pressure is consistent with at least mild-to-moderate pulmonary hypertension (TR peak gradient 41 mmHg). RA pressure could not be assessed as the IVC is not well visualized.  4. Severely dilated left atrium.  5. Moderately dilated right atrium.  6. There is mild primary mitral valve regurgitation.  7. A mechanical prosthetic valve is present in the aortic position. The aortic valve prosthesis appears well seated. The aortic prosthesis demonstrates a moderately increased transvalvular gradient for valve type and size. This is suggestive of moderate prosthetic aortic stenosis.    ECG 01/11/17  1. A-Fib RVR  2. PVCs  3. Nonspecific T wave changes    Nuclear Stress Test 01/20/17  1. The patient had decreased perfusion both at rest and stress in the inferior lateral wall and this was considered a mild area of moderate intensity, consistent with diaphragmatic attenuation artifact.  There was no evidence of ischemia noted.   2. The patient had septal akinesis and mild global hypokinesis noted on gated wall motion analysis with  an ejection fraction moderately decreased at 36%.  3. The patient was noted to have a dilated left ventricle, both at rest and stress.    Assessment and Plan:     Imri Lor is a 67 y.o. male with    AS:  s/p St. Judes AVR in 1999.  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  - follow with Echos, will re-echo after HR better controlled for better AV assessment  - on Coumadin with goal INR 2.5-3.5  - not on aspirin given h/o GI bleed    Cardiomyopathy/Diastolic CHF:  EF 19-41% on 07/11/14 Echo and now EF 45%, mildly dilated LV, and global LV hypokinesis on 12/14/16 Echo.  Nuc. Stress test 01/20/17- No evidence of ischemia noted, septal akinesis and mild global hypokinesis with an EF of 36%, and a dilated LV, bot at rest and stress.  - c/o SOB and worsening LE edema  - intolerant to Spironolactone  (gynecomastia)  - increase Toprol-XL to 100 mg QD  - start Lisinopril 5 mg QD  - start Eplerenone 25 mg QD  - continue Lasix, instructed to take 40 mg BID for 3 days given LE edema, then return back to daily  - d/c potassium supplement    A-Fib:  Hx of DCCV.  Echo 12/14/16- Presence of A-Fib, severely dilated LA, and moderately dilated RA.  - c/o SOB/DOE  - CHA2DS2-VASc score of 3 (age + HTN + CMP/CHF)  - after 5 years of A Fib it is not likely that we will be able to successfully rhythm control him  - continue Coumadin for stroke prophylaxis  - continue Toprol for rate control, increase to 100 mg for better HR control    Pulmonary HTN:  Moderately dilated RV and mild-to-moderate pulmonary HTN on 12/14/16 Echo.  - c/o SOB/DOE  - see above    HTN:  BP elevated today  - med changes, as above    Lipids:  FLP 07/10/14- Total 74, Trig. 115, HDL 18, LDL 33  - not on statin therapy  - encouraged heart healthy diet and exercise    ?OSA:  Witnessed snoring and apneic events  - discussed future sleep study    Nonsmoker    Hx of Marfan's Syndrome (?):  - unclear how diagnosed, he does not have typical features, no h/o ectopia lentis, aortic root normal in size on recent echo      Follow up in 1 month      Thank you for allowing me to participate in the care of your patient. Please feel free to contact me if there are further questions.     I am scribing for, and in the presence of Dr. Barnet Glasgow, MD for services provided on 02/11/2017.  Reynolds Bowl, [place]  02/11/2017, 10:29    I personally performed the services described in this documentation, as scribed in my presence, and it is both accurate and complete.    Barnet Glasgow, MD    Barnet Glasgow, MD  02/11/2017, 10:39

## 2017-02-10 NOTE — Telephone Encounter (Signed)
Pt notified of INR results and verbalized understanding. Pt has F/U lab appt on February 24, 2017.  Per Dr. Evelene Croon, have pt hold coumadin today and start 1 tab (5mg ) tu, th and 1.5 tab (7.5 mg) the rest of the week.  Gregary Cromer, MA  02/10/2017, 08:56

## 2017-02-11 ENCOUNTER — Ambulatory Visit (INDEPENDENT_AMBULATORY_CARE_PROVIDER_SITE_OTHER): Payer: Medicare Other | Admitting: Cardiovascular Disease

## 2017-02-11 ENCOUNTER — Encounter (INDEPENDENT_AMBULATORY_CARE_PROVIDER_SITE_OTHER): Payer: Self-pay | Admitting: Cardiovascular Disease

## 2017-02-11 ENCOUNTER — Other Ambulatory Visit (INDEPENDENT_AMBULATORY_CARE_PROVIDER_SITE_OTHER): Payer: Self-pay | Admitting: INTERNAL MEDICINE

## 2017-02-11 VITALS — BP 148/107 | HR 108 | Resp 15 | Wt 299.0 lb

## 2017-02-11 DIAGNOSIS — I4891 Unspecified atrial fibrillation: Secondary | ICD-10-CM

## 2017-02-11 DIAGNOSIS — I350 Nonrheumatic aortic (valve) stenosis: Secondary | ICD-10-CM

## 2017-02-11 DIAGNOSIS — Z952 Presence of prosthetic heart valve: Secondary | ICD-10-CM

## 2017-02-11 DIAGNOSIS — I10 Essential (primary) hypertension: Secondary | ICD-10-CM

## 2017-02-11 DIAGNOSIS — I5030 Unspecified diastolic (congestive) heart failure: Secondary | ICD-10-CM

## 2017-02-11 DIAGNOSIS — I429 Cardiomyopathy, unspecified: Secondary | ICD-10-CM

## 2017-02-11 DIAGNOSIS — I480 Paroxysmal atrial fibrillation: Secondary | ICD-10-CM

## 2017-02-11 DIAGNOSIS — I272 Pulmonary hypertension, unspecified: Secondary | ICD-10-CM

## 2017-02-11 MED ORDER — EPLERENONE 25 MG TABLET
25.0000 mg | ORAL_TABLET | Freq: Every day | ORAL | 1 refills | Status: DC
Start: 2017-02-11 — End: 2018-03-08

## 2017-02-11 MED ORDER — METOPROLOL SUCCINATE ER 100 MG TABLET,EXTENDED RELEASE 24 HR
100.0000 mg | ORAL_TABLET | Freq: Every day | ORAL | 1 refills | Status: DC
Start: 2017-02-11 — End: 2017-03-15

## 2017-02-11 MED ORDER — LISINOPRIL 5 MG TABLET
5.0000 mg | ORAL_TABLET | Freq: Every day | ORAL | 1 refills | Status: DC
Start: 2017-02-11 — End: 2017-09-06

## 2017-02-19 ENCOUNTER — Other Ambulatory Visit (HOSPITAL_BASED_OUTPATIENT_CLINIC_OR_DEPARTMENT_OTHER): Payer: Self-pay | Admitting: Student in an Organized Health Care Education/Training Program

## 2017-02-19 MED ORDER — FUROSEMIDE 40 MG TABLET
40.0000 mg | ORAL_TABLET | Freq: Every day | ORAL | 0 refills | Status: DC
Start: 2017-02-19 — End: 2017-03-25

## 2017-02-22 ENCOUNTER — Encounter (INDEPENDENT_AMBULATORY_CARE_PROVIDER_SITE_OTHER): Payer: Self-pay | Admitting: INTERNAL MEDICINE

## 2017-02-24 ENCOUNTER — Ambulatory Visit: Payer: Medicare Other | Attending: INTERNAL MEDICINE

## 2017-02-24 ENCOUNTER — Ambulatory Visit (INDEPENDENT_AMBULATORY_CARE_PROVIDER_SITE_OTHER): Payer: Self-pay | Admitting: INTERNAL MEDICINE

## 2017-02-24 DIAGNOSIS — I4891 Unspecified atrial fibrillation: Secondary | ICD-10-CM | POA: Insufficient documentation

## 2017-02-24 LAB — PT/INR
INR: 2.37
PROTHROMBIN TIME: 28.3 seconds — ABNORMAL HIGH (ref 9.4–12.5)

## 2017-02-24 NOTE — Progress Notes (Signed)
02/24/17 1400   [facility]/INR   Date INR drawn 02/24/17   INR 2.4   New warfarin dosage INR is at goal   New weekly dosgae Keep the same   Comments Check INR in onr month   Anticoag Indication DVT lower extremity   Goal INR 2-3

## 2017-02-24 NOTE — Progress Notes (Signed)
Left VM for Pt to call the office.  [name], [place]  02/24/2017, 15:35

## 2017-02-25 NOTE — Nursing Note (Signed)
Pt notified and lab appt scheduled.  Pt is taking two tabs on Mon and Fri with 1.5 tabs all other days.

## 2017-03-08 ENCOUNTER — Ambulatory Visit (INDEPENDENT_AMBULATORY_CARE_PROVIDER_SITE_OTHER): Payer: Medicare Other | Admitting: INTERNAL MEDICINE

## 2017-03-08 ENCOUNTER — Other Ambulatory Visit (INDEPENDENT_AMBULATORY_CARE_PROVIDER_SITE_OTHER): Payer: Self-pay | Admitting: INTERNAL MEDICINE

## 2017-03-08 ENCOUNTER — Encounter (INDEPENDENT_AMBULATORY_CARE_PROVIDER_SITE_OTHER): Payer: Self-pay | Admitting: INTERNAL MEDICINE

## 2017-03-08 VITALS — HR 88 | Temp 97.9°F | Ht 75.0 in | Wt 282.0 lb

## 2017-03-08 DIAGNOSIS — Z0000 Encounter for general adult medical examination without abnormal findings: Principal | ICD-10-CM

## 2017-03-08 DIAGNOSIS — M79672 Pain in left foot: Principal | ICD-10-CM

## 2017-03-08 DIAGNOSIS — M79671 Pain in right foot: Secondary | ICD-10-CM

## 2017-03-08 NOTE — Nursing Note (Signed)
03/08/17 1400   Medicare Wellness Assessment   Medicare initial or wellness physical in the last year? No   Advance Directives   Does patient have a living will or MPOA YES   Has patient provided [employer] with a copy? no   Advance directive information given to the patient today? no   Activities of Daily Living   Do you need help with dressing, bathing, or walking? No   Do you need help with shopping, housekeeping, medications, or finances? No   Do you have rugs in hallways, broken steps, or poor lighting? No   Do you have grab bars in your bathroom, non-slip strips in your tub, and hand rails on your stairs? Yes   Cognitive Function Screen   What is you age? 1   What is the time to the nearest hour? 1   What is the year? 1   What is the name of this clinic? 1   Can the patient recognize two persons (the doctor, the nurse, home help, etc.)? 1   What is the date of your birth? (day and month sufficient)  1   In what year did World War II end? 1   Who is the current president of the [place]? 1   Count from 20 down to 1? 1   What address did I give you earlier? 1   Total Score 10   Depression Screen   Little interest or pleasure in doing things. 0   Feeling down, depressed, or hopeless 0   PHQ 2 Total 0   Hearing Screen   Have you noticed any hearing difficulties? No   After whispering 9-1-6 how many numbers did the patient repeat correctly? 3   Fall Risk Assessment   Do you feel unsteady when standing or walking? No   Do you worry about falling? No   Have you fallen in the past year? No   Vision Screen   Right Eye = 20 20  (with glasses)   Left Eye = 20 20  (with glasses)

## 2017-03-08 NOTE — Nursing Note (Signed)
Pulse 88   Temp 36.6 C (97.9 F) (Thermal Scan)   Ht 1.905 m (6\' 3" )   Wt 127.9 kg (282 lb)   SpO2 98%   BMI 35.25 kg/m     Kenneth Weeks, Kenneth Weeks  03/08/2017, 14:30  kia johnson took vitals

## 2017-03-08 NOTE — Progress Notes (Signed)
[facility] Internal Medicine   [address]   [place], [place] [address]     [phone]  [phone] (fax)      INTERNAL MEDICINE, [facility]  [place] [id number]    Medicare Annual Wellness Visit    Name: Kenneth Weeks MRN:  [id number]   Date: 03/08/2017 Age: 67 y.o.       SUBJECTIVE:   Kenneth Weeks is a 67 y.o. male for presenting for Medicare Wellness exam.     I have reviewed and reconciled the medication list with the patient today.  [name] has avoided the flu this winter     He has been started on new BP medications since I saw him last. "My leg swelling is a lot better."     Since our last visit together, has experienced:   ER -- no visits recent   Urgent care visits -- no visits in the past few months   Accidents with a car/truck -- nothing at all: no fender-bender   Injuries at home which resulted in injury -- no falls; no injuries with tools   Medications from other providers -- nothing new from other doctors   Surgeries or other procedures -- nothing since the colonoscopy       Medications reviewed by me personally:   Warfarin 7.58m daily in the AM   Synthroid 2029m daily in the AM   Lisinopril 5 mg daily   Metoprolol XL 100 mg daily   Inspra 25 mg daily   Lasix 40 mg daily     Spironolactone 50 mg daily -- no taking   Potassium daily -- OTC supplement   Magnesium tablet   Vitamin D3   Calcium OTC          Changes in family history since we last met --  [place]          I have reviewed and updated as appropriate the past medical, family and social history. 03/08/2017 as summarized below:  Past Medical History:   Diagnosis Date   . A-fib (CMS HCC)     Chronic anti-coagulant therapy; Hx cardioversions   . Abdominal hernia     Ventral hernia; Left Inguinal hernia   . Acute on chronic renal failure (CMS HCC)    . Biliary and gallbladder disorder     "Gallstones"   . [facility]    [facility]   . Congestive  heart failure (CMS HCC)    . [place]    type ii diet controlled   . Dysrhythmias     a fib   . Hemorrhage of gastrointestinal tract 5/16   . Hemorrhagic shock (CMS HCC)    . Hepatic cirrhosis (CMS HCC)    . Hydrocele    . Hypertension 07/10/2014   . Marfan syndrome    . Muscle weakness     Generalized   . Obesity    . Syncope    . Thyroid disease     hypothyroid   . Transfusion history     s/p Multiple units PRC/FFP this adm.   . Valvular disease     [place] aortic valve replacement-chronic anti-coagulant therapy   . Wears glasses      Past Surgical History:   Procedure Laterality Date   . Colonoscopy     . Hx colonoscopy     . Hx heart catheterization     .  Hx heart valve surgery  1999   . Hx hemicolectomy Right 07/13/2014   . Hx tonsil and adenoidectomy     . Hx tonsillectomy     . Hx upper endoscopy  07/09/14   . Hx wisdom teeth extraction       Current Outpatient Medications   Medication Sig   . cholecalciferol, vitamin D3, 1,000 unit Oral Tablet Take 1,000 Units by mouth Once a day   . docusate sodium (COLACE) 100 mg Oral Capsule Take 1 Cap (100 mg total) by mouth Twice daily   . eplerenone (INSPRA) 25 mg Oral Tablet Take 1 Tab (25 mg total) by mouth Once a day   . furosemide (LASIX) 40 mg Oral Tablet Take 1 Tab (40 mg total) by mouth Once a day   . levothyroxine (SYNTHROID) 200 mcg Oral Tablet TAKE ONE TABLET BY MOUTH ONCE DAILY   . lisinopril (PRINIVIL) 5 mg Oral Tablet Take 1 Tab (5 mg total) by mouth Once a day   . MAGNESIUM CARBONATE ORAL Take by mouth Once a day   . metoprolol succinate (TOPROL-XL) 100 mg Oral Tablet Sustained Release 24 hr Take 1 Tab (100 mg total) by mouth Once a day   . vitamin B complex Oral Tablet Take 1 Tab by mouth Once a day   . warfarin (COUMADIN) 5 mg Oral Tablet Take 1.5 Tabs (7.5 mg total) by mouth Once a day (Patient taking differently: Take 7.5 mg by mouth Once a day To be adjusted by following provider)     Family Medical History:     Problem Relation  (Age of Onset)    Cirrhosis Brother    Hypertension Mother    Lung Cancer Father            Social History     Socioeconomic History   . Marital status: Married     Spouse name: Not on file   . Number of children: Not on file   . Years of education: Not on file   . Highest education level: Not on file   Social Needs   . Financial resource strain: Not on file   . Food insecurity - worry: Not on file   . Food insecurity - inability: Not on file   . Transportation needs - medical: Not on file   . Transportation needs - non-medical: Not on file   Occupational History   . Not on file   Tobacco Use   . Smoking status: Never Smoker   . Smokeless tobacco: Never Used   Substance and Sexual Activity   . Alcohol use: No     Alcohol/week: 0.0 oz   . Drug use: No   . Sexual activity: Not on file   Other Topics Concern   . Ability to Walk 1 Flight of Steps without SOB/CP Yes   . Routine Exercise No   . Ability to Walk 2 Flight of Steps without SOB/CP Yes   . Unable to Ambulate No   . Total Care No   . Ability To Do Own ADL's Yes   . Uses Walker No   . Other Activity Level No   . Uses Cane No   Social History Narrative   . Not on file     Review of Systems: A comprehensive review of systems was negative.   Exceptions noted above      List of Current Health Care Providers   Care Team     PCP  Name Type Specialty Phone Number    Viona Gilmore, MD Physician [facility] Internal Medicine [phone]          Care Team     Name Type Specialty Phone Number    Viona Gilmore, MD Physician INTERNAL MEDICINE [phone]                  Health Maintenance   Topic Date Due   . Adult Tdap-Td (1 - Tdap) 05/13/1967   . Shingles Vaccine (1 of 2) 05/13/1998   . Annual Wellness Exam  03/03/2012   . Pneumococcal 65+ Years Low Risk (1 of 2 - PCV13) 05/12/2013   . Influenza Vaccine (1) 10/10/2016   . Depression Screening  02/16/2017   . Colonoscopy  01/27/2026   . Hepatitis C screening antibody  Completed     Medicare Wellness  Assessment   Medicare initial or wellness physical in the last year?: No  Advance Directives (optional)   Does patient have a living will or MPOA: YES   Has patient provided [employer] with a copy?: no   Advance directive information given to the patient today?: no      Activities of Daily Living   Do you need help with dressing, bathing, or walking?: No   Do you need help with shopping, housekeeping, medications, or finances?: No   Do you have rugs in hallways, broken steps, or poor lighting?: No   Do you have grab bars in your bathroom, non-slip strips in your tub, and hand rails on your stairs?: Yes   Urinary Incontinence Screen (Women >=65 only)       Cognitive Function Screen (1=Yes, 0=No)   What is you age?: Correct   What is the time to the nearest hour?: Correct   What is the year?: Correct   What is the name of this clinic?: Correct   Can the patient recognize two persons (the doctor, the nurse, home help, etc.)?: Correct   What is the date of your birth? (day and month sufficient) : Correct   In what year did World War II end?: Correct   Who is the current president of the [place]?: Correct   Count from 20 down to 1?: Correct   What address did I give you earlier?: Correct   Total Score: 10   Hearing Screen   Have you noticed any hearing difficulties?: No  After whispering 9-1-6 how many numbers did the patient repeat correctly?: 3   Fall Risk Screen   Do you feel unsteady when standing or walking?: No  Do you worry about falling?: No  Have you fallen in the past year?: No   Vision Screen   Right Eye = 20: 20(with glasses)   Left Eye = 20: 20(with glasses)   Depression Screen   Little interest or pleasure in doing things.: Not at all  Feeling down, depressed, or hopeless: Not at all  PHQ 2 Total: 0        OBJECTIVE:   Pulse 88   Temp 36.6 C (97.9 F) (Thermal Scan)   Ht 1.905 m ('6\' 3"' )   Wt 127.9 kg (282 lb)   SpO2 98%   BMI 35.25 kg/m      Physical Exam     General -- alert     Repeat BP  taken by me -- 138/80     HEENT -- PERRL, EOMI   Heart --mechanical click  Neck -- Thyroid is normal sized  Lungs -- clear; no crackles in bases  Abdomen -- soft and tender  Neuro -- normal gait and tone; strength is 5/5  Skin -- no rashes noted   Psych -- pleasant and smiling   Vascular -- some edema in BL legs       Labs -- reviewed by me in the EMR   January 2019    Dr. Tish Men -- Cardiomyopathy/diastolic CHF     November 2018    Creatinine 1.22   LFT's okay     May 2018                 WBC 5.9                Hgb 15.7                Platelets 127                 Creatinine 1.02                A1c 5.7%                 CEA 1.17 January 2016                 Colonic biopsy -- hyperplastic polyp only     September 2017                 Creatinine 1.21                 Hgb 15.18 August 2015                 Hgb 15.7                Creatinine 1.14                CT scan of chest -- minimal lung nodules, not changed; not big     March 2017                 INR 2.17 January 2015                 Dr. Burke Keels note: no evidence of any metastatic disease                 Dr. Dianah Field note: pT2N0 stage I colon cancer                 Hgb 11.8                 Creatinine 1.04                 Glucose 99                LFT's okay                CEA 1.5                CT chest/abdomen/pelvis -- no evidence of recurrent disease; some small lung nodules     June 2016                [place] Note:                               Follow up colonoscopy in December 2016  CT scan of chest/abdomen/pelvis in summer 2017     May 2016                Creatinine 1.27                Glucose 136 (not fasting)                 LFT's okay     March 2016                 Creatinine 1.02                TSH 1.23                LFT's okay     Health Maintenance Due   Topic Date Due   . Adult Tdap-Td (1 - Tdap) 05/13/1967   . Shingles Vaccine (1 of 2) 05/13/1998   . Annual Wellness Exam  03/03/2012   .  Pneumococcal 65+ Years Low Risk (1 of 2 - PCV13) 05/12/2013   . Influenza Vaccine (1) 10/10/2016   . Depression Screening  02/16/2017      ASSESSMENT & PLAN:    Identified Risk Factors/ Recommended Actions   Diabetes -- doing fantastic     Ventral hernia repair  -- he is not truly limited.    S/P laporoscopic right hemicolectomy with stapled anastomosis for stage II colon cancer -- had a clean colonoscopy in December 2016 and December 2017. 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 - he is back on Toprol and BP medication     Warfarin management for mechanical heart valve -- INR at goal last check     Atrial fibrilation -- he does not have any limiting tachycardia     Hypothyroidism -- on replacement    Obesity -- he is generally doing well overall     Vision -- glasses; no changes     Hearing issues -- not limited     Exercise --  He is walking on a regular basis on the treadmill     Need for HIV or hepatitis C screening -- no reason to screen     Tobacco abuse screening -- none at all     Alcohol and drug abuse screening -- none at all     Depression/anxiety screening -- he denies any at this time     Screening for colorectal cancer -- he is not due for screening     Immunizations -- I recommend a yearly flu shot       DISPOSITION:  Continue current medications   Follow up with me in 6 months         No orders of the defined types were placed in this encounter.         The patient has been educated about risk factors and recommended preventive care. Written Prevention Plan completed/ updated and given to patient (see After Visit Summary).    No follow-ups on file.    Viona Gilmore, MD  [facility] INTERNAL MEDICINE  INTERNAL MEDICINE, [facility]  [address]  [place] [address]  [phone]

## 2017-03-08 NOTE — Patient Instructions (Signed)
Medicare Preventive Services  Medicare coverage information Recommendation for YOU   Heart Disease and Diabetes   Lipid profile every 5 years or more often if at risk for cardiovascular disease     Diabetes Screening with Blood Glucose test or Glucose Tolerance Test  yearly for those at risk for diabetes, up to two tests per year for those with prediabetes  Last Glucose: 119     Diabetes Self-Management Training initial training ten hours per year, and follow-up training two hours per subsequent year. Optional for those with diabetes    Medical Nutrition Therapy three hours of one-on-one counseling in first year, two hours in subsequent years. Optional for those with diabetes, kidney disease   Intensive Behavioral Therapy for Obesity  Face-to-face counseling, first month every week, month 2-6 every other week, month 7-12 every month if continued progress is documented Optional for those with Body Mass Index 30 or higher  Your Body mass index is 35.25 kg/m.   Tobacco Cessation (Quitting) Counseling   Two attempts per year, max 4 sessions per attempt, up to 8 sessions per year Optional for those who use tobacco    Cancer Screening   Colorectal screening   for anyone age 4 to 55 or any age if high risk:  . Screening Colonoscopy every 10 years, more frequent if high risk  OR  . Flexible  Sigmoidoscopy  every 5 years OR  . Fecal Occult Blood Testing yearly OR  . Cologuard Stool DNA test once every 3 years OR  . CT Colonography every 5 years  See Your Schedule below   Prostate Cancer Screening  Prostate Specific Antigen and Digital Rectal Exam NOT routinely recommended  Medicare will still cover annually after age 1 if recommended by your provider      Lung Cancer Screening  Annual low dose computed tomography (LDCT scan) is recommended for those age 50-77 who smoked 30 pack-years and are current smokers or quit smoking within past 15 years (one pack-year= smoking one PPD for one year), after counseling by your doctor  or nurse clinician about the possible benefits or harms. See Your Schedule below   Vaccinations   Pneumococcal Vaccine recommended routinely age 16+ with two separate vaccines one year apart (Prevnar then Pneumovax).  Recommended before age 57 if medical conditions increase risk  Seasonal Influenza Vaccine once every flu season   Hepatitis B Vaccine 3 doses if risk (including anyone with diabetes or liver disease)  Shingles Vaccine Once or twice age 24 or older  Diphtheria Tetanus Pertussis Vaccine ONCE as adult and a booster every 10 years      There is no immunization history on file for this patient.  Shingles vaccine and Diphtheria Tetanus Pertussis vaccines are available at pharmacies or local health department without a prescription.   Other Screening   Glaucoma Screening   yearly if in high risk group such as diabetes, family history, African American age 31+ or Hispanic American age 39+    Hepatitis C Screening recommended ONCE for those born between 1945-1965, or high risk for HCV infection  See Your Schedule below   HIV Testing recommended routinely at least ONCE, covered every year for age 29 to 24 regardless of risk, and every year for age over 39 who ask for the test or higher risk See Your Schedule below  Abdominal Aortic Aneurysm Screening Ultrasound Recommended ONCE for any male who smoked 100 cigarettes/lifetime OR with family history of aortic aneurysm     Your  Personalized Schedule for Preventive Tests   Health Maintenance: Pending and Last Completed       Date Due Completion Date    Adult Tdap-Td (1 - Tdap) 05/13/1967 ---    Shingles Vaccine (1 of 2) 05/13/1998 ---    Annual Wellness Exam 03/03/2012 ---    Pneumococcal 65+ Years Low Risk (1 of 2 - PCV13) 05/12/2013 ---    Influenza Vaccine (1) 10/10/2016 ---    Depression Screening 02/16/2017 02/17/2016    Colonoscopy 01/27/2026 01/28/2016

## 2017-03-08 NOTE — Nursing Note (Signed)
Pulse 88   Temp 36.6 C (97.9 F) (Thermal Scan)   Ht 1.905 m (6\' 3" )   Wt 127.9 kg (282 lb)   SpO2 98%   BMI 35.25 kg/m     Fritz Pickerel, CMA  03/08/2017, 14:25  Wallie Renshaw took vitals

## 2017-03-10 NOTE — Progress Notes (Signed)
[address] [place] [id number]  [phone]    Date: 03/15/2017  Patient Name: Kenneth Weeks  MRN#: [id number]  DOB: 1950/09/07    Provider: Barnet Glasgow, MD  PCP: Viona Gilmore, MD      Reason for visit: Arrhythmia (1 month follow up )    History:     Kenneth Weeks is a 67 y.o. male with a history of  A-Fib, AS s/p AVR, Cardiomyopathy/CHF, and Pulmonary HTN.  He was last seen on 02/11/17 when he reported significant SOB when walking uphill, weight gain, and worsening LE edema. His meds were adjusted.    Today he reports that he has lost a lot of his water weight and his dyspnea and edema have improved from his last visit. He is now able to lay flat at night. He does occasionally weigh himself. No c/o any CP, SOB, palpitations, syncope/presyncope, orthopnea/PND, or claudication. He is married and lives in [place], [place].  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      Past Medical History:     Past Medical History:   Diagnosis Date   . A-fib (CMS HCC)     Chronic anti-coagulant therapy; Hx cardioversions   . Abdominal hernia     Ventral hernia; Left Inguinal hernia   . Acute on chronic renal failure (CMS HCC)    . Biliary and gallbladder disorder     "Gallstones"   . Cancer (CMS [facility])     Colon Cancer   . Congestive heart failure (CMS HCC)    . Diabetes mellitus (CMS [place])     type ii diet controlled   . Dysrhythmias     a fib   . Hemorrhage of gastrointestinal tract 5/16   . Hemorrhagic shock (CMS HCC)    . Hepatic cirrhosis (CMS HCC)    . Hydrocele    . Hypertension 07/10/2014   . Marfan syndrome    . Muscle weakness     Generalized   . Obesity    . Syncope    . Thyroid disease     hypothyroid   . Transfusion history     s/p Multiple units PRC/FFP this adm.   . Valvular disease     St Judes aortic valve replacement-chronic anti-coagulant therapy   . Wears glasses          Past Surgical History:     Past Surgical History:      Procedure Laterality Date   . COLONOSCOPY     . HX COLONOSCOPY     . HX HEART CATHETERIZATION     . [place]    st judes    . HX HEMICOLECTOMY Right 07/13/2014    Laparoscopic right hemicolectomy for colon cancer   . HX TONSIL AND ADENOIDECTOMY     . HX TONSILLECTOMY     . HX UPPER ENDOSCOPY  07/09/14   . HX WISDOM TEETH EXTRACTION           Allergies:     Allergies   Allergen Reactions   . Allegra [Fexofenadine]  Other Adverse Reaction (Add comment)     "retain fluid"       Medications:     Current Outpatient Medications   Medication Sig   . cholecalciferol, vitamin D3, 1,000 unit Oral Tablet Take 1,000 Units by mouth Once a day   . docusate sodium (COLACE)  100 mg Oral Capsule Take 1 Cap (100 mg total) by mouth Twice daily   . eplerenone (INSPRA) 25 mg Oral Tablet Take 1 Tab (25 mg total) by mouth Once a day   . furosemide (LASIX) 40 mg Oral Tablet Take 1 Tab (40 mg total) by mouth Once a day   . levothyroxine (SYNTHROID) 200 mcg Oral Tablet TAKE ONE TABLET BY MOUTH ONCE DAILY   . lisinopril (PRINIVIL) 5 mg Oral Tablet Take 1 Tab (5 mg total) by mouth Once a day   . MAGNESIUM CARBONATE ORAL Take by mouth Once a day   . metoprolol succinate (TOPROL-XL) 100 mg Oral Tablet Sustained Release 24 hr Take 100 mg by mouth Once a day   . vitamin B complex Oral Tablet Take 1 Tab by mouth Once a day   . warfarin (COUMADIN) 5 mg Oral Tablet Take 1.5 Tabs (7.5 mg total) by mouth Once a day (Patient taking differently: Take 7.5 mg by mouth Once a day To be adjusted by following provider)       Family History:     Family Medical History:     Problem Relation (Age of Onset)    Cirrhosis Brother    Hypertension Mother    Lung Cancer Father              Social History:     Social History     Socioeconomic History   . Marital status: Married     Spouse name: Not on file   . Number of children: Not on file   . Years of education: Not on file   . Highest education level: Not on file   Social Needs   . Financial  resource strain: Not on file   . Food insecurity - worry: Not on file   . Food insecurity - inability: Not on file   . Transportation needs - medical: Not on file   . Transportation needs - non-medical: Not on file   Occupational History   . Not on file   Tobacco Use   . Smoking status: Never Smoker   . Smokeless tobacco: Never Used   Substance and Sexual Activity   . Alcohol use: No     Alcohol/week: 0.0 oz   . Drug use: No   . Sexual activity: Not on file   Other Topics Concern   . Ability to Walk 1 Flight of Steps without SOB/CP Yes   . Routine Exercise No   . Ability to Walk 2 Flight of Steps without SOB/CP Yes   . Unable to Ambulate No   . Total Care No   . Ability To Do Own ADL's Yes   . Uses Walker No   . Other Activity Level No   . Uses Cane No   Social History Narrative   . Not on file       Review of Systems:     All other systems were reviewed and are negative other than noted in the HPI.    Physical Exam:     Vitals:    03/15/17 1554   BP: 126/85   Pulse: 98   Resp: 15   SpO2: 99%   Weight: 125.2 kg (276 lb)       Body mass index is 34.5 kg/m.    General: No acute distress, obese  HEENT: Moist mucous membranes  Neck: Supple, no JVD, no bruits  Heart/Cardiovascular:  Irregular  Soft systolic murmur, crisp valve click  2+  radial pulses  Respiratory/Lungs: Non-labored, clear to auscultation  Abdominal/Gastrointestinal: Soft, non-tender  Extremities: Warm and well-perfused, chronic 1+ LE edema with skin thickening (but much improved)  Skin: No rash  Neurological: Alert and oriented x3, grossly nonfocal  Psych: Normal affect    Cardiovascular Workup:   Hx of DCCV    AVR 1999 ([place])  1. St. Judes mechanical prosthetic AV.    Echo 07/11/14  1. Left ventricle: The LV systolic function is normal. LV ejection fraction is 55% - 60%. LV diastolic function is unable to be assessed. The study was not technically sufficient to allow evaluation of LV diastolic dysfunction due to atrial fibrillation. There  was no evidence of increased ventricular filling pressure by Doppler parameters.  2. Tricuspid valve: Mild-moderate tricuspid valve regurgitation.  3. Left atrium: The left atrial size is moderately dilated.    CT Chest/Abd./Pelvis 06/12/16  1. New area of groundglass opacity in the right upper lobe with a nodular more solid appearing component measuring 9 mm. This appearance is suspicious for malignancy, although localized infection may have a similar appearance.  2. Increased size of paratracheal lymph node, now measuring 2.4 cm. While this retains benign characteristics, metastatic disease is not excluded.   3. Unchanged size of multiple tiny pulmonary nodules.  4. Unchanged size of multiple prominent right cervical lymph nodes.  5. Cholelithiasis without evidence of cholecystitis. Sigmoid diverticulosis without specific evidence of diverticulitis.  6. New ventral and periumbilical hernias containing loops of small bowel without evidence of strangulation.    Echo 12/14/16  1. Mildly dilated left ventricle. Mildly depressed left ventricular systolic function. LV Ejection Fraction is 45 %. Normal geometry. Left ventricular diastolic function could not be assessed due to the presence of atrial fibrillation during the study.  2. Resting Segmental Wall Motion Analysis: Total wall motion score is 2.00. There is global left ventricular hypokinesis.  3. Moderately dilated right ventricle. Mildly depressed right ventricular systolic function. RV systolic pressure is consistent with at least mild-to-moderate pulmonary hypertension (TR peak gradient 41 mmHg). RA pressure could not be assessed as the IVC is not well visualized.  4. Severely dilated left atrium.  5. Moderately dilated right atrium.  6. There is mild primary mitral valve regurgitation.  7. A mechanical prosthetic valve is present in the aortic position. The aortic valve prosthesis appears well seated. The aortic prosthesis demonstrates a moderately  increased transvalvular gradient for valve type and size. This is suggestive of moderate prosthetic aortic stenosis.    ECG 01/11/17  1. A-Fib RVR  2. PVCs  3. Nonspecific T wave changes    Nuclear Stress Test 01/20/17  1.The patient had decreased perfusion both at rest and stress in the inferior lateral wall and this was considered a mild area of moderate intensity, consistent with diaphragmatic attenuation artifact. There was no evidence of ischemia noted.   2.The patient had septal akinesis and mild global hypokinesis noted on gated wall motion analysis with an ejection fraction moderately decreased at 36%.  3.The patient was noted to have a dilated left ventricle, both at rest and stress.    Assessment and Plan:     Kenneth Weeks is a 67 y.o. male with    AS:s/p St. Judes AVR in 1999. 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  - follow with Echos, will re-echo after HR better controlled for better AV assessment  - on Coumadin with goal INR 2.5-3.5; last INR 2.37 02/24/17  - not  on aspirin given h/o GI bleed    Cardiomyopathy/Diastolic CHF:EF 52-84% on 07/11/14 Echo and now EF 45%, mildly dilated LV, and global LV hypokinesis on 12/14/16 Echo.  Nuc. Stress test 01/20/17- No evidence of ischemia noted, septal akinesis and mild global hypokinesis with an EF of 36%, and a dilated LV, bot at rest and stress.  - intolerant to Spironolactone (gynecomastia)  - continue on Toprol-XL; increase to 200 mg daily at next refill (he is to call with bottle info as formulation may have changed per his description)  - continue on Lisinopril   - continue on Eplerenone   - continue Lasix    A-Fib:Hx of DCCV. Echo 12/14/16- Presence of A-Fib, severely dilated LA, and moderately dilated RA.  - no SOB/DOE   - HR today is 98   - CHA2DS2-VASc score of 3 (age + HTN + CMP/CHF)  - after 5 years of A Fib it is not likely that we will be able to successfully rhythm control him  -  continue Coumadin for stroke prophylaxis  - continue Toprol for rate control; after he finishes current bottle will increase to 200 mg for better HR control as above    Pulmonary XLK:[id number] dilated RV and mild-to-moderate pulmonary HTN on 12/14/16 Echo.  - c/o SOB/DOE    HTN:BP is controlled   -   Lipids:FLP 07/10/14- Total 74, Trig. 115, HDL 18, LDL 33  - not on statin therapy  - encouraged heart healthy diet and exercise    ?OSA:  Witnessed snoring and apneic events  - consider future sleep study    Nonsmoker    Hx of Marfan's Syndrome (?):  - unclear how diagnosed, he does not have typical features, no h/o ectopia lentis, aortic root normal in size on recent echo    Follow up in 3 months    Thank you for allowing me to participate in the care of your patient. Please feel free to contact me if there are further questions.     I am scribing for, and in the presence of Dr. Barnet Glasgow, MD for services provided on 03/15/2017.  Debbora Dus, [place]  03/15/2017, 16:20    I personally performed the services described in this documentation, as scribed in my presence, and it is both accurate and complete.    Barnet Glasgow, MD    Barnet Glasgow, MD  03/15/2017, 16:29

## 2017-03-15 ENCOUNTER — Encounter (INDEPENDENT_AMBULATORY_CARE_PROVIDER_SITE_OTHER): Payer: Self-pay | Admitting: Cardiovascular Disease

## 2017-03-15 ENCOUNTER — Ambulatory Visit (INDEPENDENT_AMBULATORY_CARE_PROVIDER_SITE_OTHER): Payer: Medicare Other | Admitting: Cardiovascular Disease

## 2017-03-15 VITALS — BP 126/85 | HR 98 | Resp 15 | Wt 276.0 lb

## 2017-03-15 DIAGNOSIS — I429 Cardiomyopathy, unspecified: Secondary | ICD-10-CM

## 2017-03-15 DIAGNOSIS — C182 Malignant neoplasm of ascending colon: Secondary | ICD-10-CM

## 2017-03-15 DIAGNOSIS — I5030 Unspecified diastolic (congestive) heart failure: Secondary | ICD-10-CM

## 2017-03-15 DIAGNOSIS — I480 Paroxysmal atrial fibrillation: Secondary | ICD-10-CM

## 2017-03-15 DIAGNOSIS — I350 Nonrheumatic aortic (valve) stenosis: Secondary | ICD-10-CM

## 2017-03-15 DIAGNOSIS — I4891 Unspecified atrial fibrillation: Secondary | ICD-10-CM

## 2017-03-15 DIAGNOSIS — E8770 Fluid overload, unspecified: Secondary | ICD-10-CM

## 2017-03-15 DIAGNOSIS — I509 Heart failure, unspecified: Secondary | ICD-10-CM

## 2017-03-15 DIAGNOSIS — I10 Essential (primary) hypertension: Secondary | ICD-10-CM

## 2017-03-15 DIAGNOSIS — I272 Pulmonary hypertension, unspecified: Secondary | ICD-10-CM

## 2017-03-22 ENCOUNTER — Ambulatory Visit (HOSPITAL_BASED_OUTPATIENT_CLINIC_OR_DEPARTMENT_OTHER): Payer: Medicare Other | Admitting: Surgery

## 2017-03-22 ENCOUNTER — Observation Stay
Admission: AD | Admit: 2017-03-22 | Discharge: 2017-03-24 | Disposition: A | Discharge status: Home / Self Care | Payer: Medicare Other | Source: Ambulatory Visit | Attending: Surgery | Admitting: Surgery

## 2017-03-22 ENCOUNTER — Ambulatory Visit (HOSPITAL_BASED_OUTPATIENT_CLINIC_OR_DEPARTMENT_OTHER)
Admission: RE | Admit: 2017-03-22 | Discharge: 2017-03-22 | Disposition: A | Discharge status: Home / Self Care | Payer: Medicare Other | Source: Ambulatory Visit | Attending: Student in an Organized Health Care Education/Training Program | Admitting: Student in an Organized Health Care Education/Training Program

## 2017-03-22 ENCOUNTER — Ambulatory Visit (HOSPITAL_BASED_OUTPATIENT_CLINIC_OR_DEPARTMENT_OTHER): Payer: Medicare Other | Admitting: Student in an Organized Health Care Education/Training Program

## 2017-03-22 ENCOUNTER — Ambulatory Visit (HOSPITAL_BASED_OUTPATIENT_CLINIC_OR_DEPARTMENT_OTHER)
Admission: RE | Admit: 2017-03-22 | Discharge: 2017-03-22 | Disposition: A | Discharge status: Home / Self Care | Payer: Medicare Other | Source: Ambulatory Visit

## 2017-03-22 ENCOUNTER — Inpatient Hospital Stay (HOSPITAL_BASED_OUTPATIENT_CLINIC_OR_DEPARTMENT_OTHER): Payer: Medicare Other | Admitting: Surgery

## 2017-03-22 ENCOUNTER — Encounter (HOSPITAL_BASED_OUTPATIENT_CLINIC_OR_DEPARTMENT_OTHER): Payer: Self-pay | Admitting: Student in an Organized Health Care Education/Training Program

## 2017-03-22 VITALS — BP 140/80 | HR 105 | Temp 97.3°F | Ht 75.0 in | Wt 269.0 lb

## 2017-03-22 VITALS — BP 134/90 | HR 112 | Temp 98.6°F | Resp 18 | Ht 75.5 in | Wt 268.7 lb

## 2017-03-22 DIAGNOSIS — K8020 Calculus of gallbladder without cholecystitis without obstruction: Secondary | ICD-10-CM

## 2017-03-22 DIAGNOSIS — C189 Malignant neoplasm of colon, unspecified: Secondary | ICD-10-CM

## 2017-03-22 DIAGNOSIS — E1122 Type 2 diabetes mellitus with diabetic chronic kidney disease: Secondary | ICD-10-CM | POA: Insufficient documentation

## 2017-03-22 DIAGNOSIS — K598 Other specified functional intestinal disorders: Secondary | ICD-10-CM

## 2017-03-22 DIAGNOSIS — Z952 Presence of prosthetic heart valve: Secondary | ICD-10-CM

## 2017-03-22 DIAGNOSIS — I4891 Unspecified atrial fibrillation: Secondary | ICD-10-CM

## 2017-03-22 DIAGNOSIS — K469 Unspecified abdominal hernia without obstruction or gangrene: Secondary | ICD-10-CM

## 2017-03-22 DIAGNOSIS — K436 Other and unspecified ventral hernia with obstruction, without gangrene: Principal | ICD-10-CM | POA: Diagnosis present

## 2017-03-22 DIAGNOSIS — E039 Hypothyroidism, unspecified: Secondary | ICD-10-CM | POA: Diagnosis present

## 2017-03-22 DIAGNOSIS — I509 Heart failure, unspecified: Secondary | ICD-10-CM | POA: Diagnosis present

## 2017-03-22 DIAGNOSIS — Z79890 Hormone replacement therapy (postmenopausal): Secondary | ICD-10-CM

## 2017-03-22 DIAGNOSIS — K439 Ventral hernia without obstruction or gangrene: Secondary | ICD-10-CM

## 2017-03-22 DIAGNOSIS — R635 Abnormal weight gain: Secondary | ICD-10-CM

## 2017-03-22 DIAGNOSIS — R109 Unspecified abdominal pain: Secondary | ICD-10-CM

## 2017-03-22 DIAGNOSIS — I130 Hypertensive heart and chronic kidney disease with heart failure and stage 1 through stage 4 chronic kidney disease, or unspecified chronic kidney disease: Secondary | ICD-10-CM | POA: Insufficient documentation

## 2017-03-22 DIAGNOSIS — K430 Incisional hernia with obstruction, without gangrene: Principal | ICD-10-CM

## 2017-03-22 DIAGNOSIS — K7460 Unspecified cirrhosis of liver: Secondary | ICD-10-CM

## 2017-03-22 DIAGNOSIS — E119 Type 2 diabetes mellitus without complications: Secondary | ICD-10-CM

## 2017-03-22 DIAGNOSIS — Z79899 Other long term (current) drug therapy: Secondary | ICD-10-CM

## 2017-03-22 DIAGNOSIS — Z9049 Acquired absence of other specified parts of digestive tract: Secondary | ICD-10-CM

## 2017-03-22 DIAGNOSIS — Z85038 Personal history of other malignant neoplasm of large intestine: Secondary | ICD-10-CM

## 2017-03-22 DIAGNOSIS — I10 Essential (primary) hypertension: Secondary | ICD-10-CM

## 2017-03-22 DIAGNOSIS — Z7901 Long term (current) use of anticoagulants: Secondary | ICD-10-CM

## 2017-03-22 DIAGNOSIS — Z888 Allergy status to other drugs, medicaments and biological substances status: Secondary | ICD-10-CM | POA: Insufficient documentation

## 2017-03-22 DIAGNOSIS — N189 Chronic kidney disease, unspecified: Secondary | ICD-10-CM | POA: Insufficient documentation

## 2017-03-22 DIAGNOSIS — I482 Chronic atrial fibrillation: Secondary | ICD-10-CM | POA: Diagnosis present

## 2017-03-22 LAB — COMPREHENSIVE METABOLIC PANEL, NON-FASTING
ALBUMIN: 3.6 g/dL (ref 3.4–4.8)
ALKALINE PHOSPHATASE: 73 U/L (ref <150)
ALT (SGPT): 17 U/L (ref <55)
ANION GAP: 8 mmol/L (ref 4–13)
AST (SGOT): 23 U/L (ref 8–48)
BILIRUBIN TOTAL: 3.1 mg/dL — ABNORMAL HIGH (ref 0.3–1.3)
BUN/CREA RATIO: 22 (ref 6–22)
BUN/CREA RATIO: 22 (ref 6–22)
BUN: 24 mg/dL (ref 8–25)
CALCIUM: 9.6 mg/dL (ref 8.5–10.2)
CHLORIDE: 99 mmol/L (ref 96–111)
CO2 TOTAL: 26 mmol/L (ref 22–32)
CREATININE: 1.08 mg/dL (ref 0.62–1.27)
ESTIMATED GFR: >59 mL/min/{1.73_m2} (ref >59)
GLUCOSE: 131 mg/dL (ref 65–139)
POTASSIUM: 4.3 mmol/L (ref 3.5–5.1)
PROTEIN TOTAL: 8.1 g/dL — ABNORMAL HIGH (ref 6.0–8.0)
SODIUM: 133 mmol/L — ABNORMAL LOW (ref 136–145)

## 2017-03-22 LAB — CBC WITH DIFF
BASOPHIL #: 0.01 10*3/uL (ref 0.00–0.20)
BASOPHIL %: 0 %
EOSINOPHIL #: 0 10*3/uL (ref 0.00–0.50)
EOSINOPHIL %: 0 %
HCT: 45.9 % (ref 36.7–47.0)
HGB: 15.2 g/dL (ref 12.5–16.3)
LYMPHOCYTE #: 0.58 10*3/uL — ABNORMAL LOW (ref 1.00–4.80)
LYMPHOCYTE %: 6 %
MCH: 30.1 pg (ref 27.4–33.0)
MCHC: 33 g/dL (ref 32.5–35.8)
MCV: 91 fL (ref 78.0–100.0)
MONOCYTE #: 0.52 10*3/uL (ref 0.30–1.00)
MONOCYTE %: 6 %
MPV: 9.2 fL (ref 7.5–11.5)
NEUTROPHIL #: 8.27 10*3/uL — ABNORMAL HIGH (ref 1.50–7.70)
NEUTROPHIL %: 88 %
PLATELETS: 161 10*3/uL (ref 140–450)
RBC: 5.05 10*6/uL (ref 4.06–5.63)
RDW: 16.9 % — ABNORMAL HIGH (ref 12.0–15.0)
WBC: 9.4 10*3/uL (ref 3.5–11.0)

## 2017-03-22 LAB — BILIRUBIN, CONJUGATED (DIRECT): BILIRUBIN DIRECT: 1.4 mg/dL — ABNORMAL HIGH (ref <0.3)

## 2017-03-22 LAB — PT/INR
INR: 3.8 — ABNORMAL HIGH (ref 0.80–1.20)
PROTHROMBIN TIME: 43.1 seconds — ABNORMAL HIGH (ref 9.5–14.1)

## 2017-03-22 LAB — POC ISTAT CREATININE (RESULT): CREATININE, POC: 1.1 mg/dl (ref 0.62–1.27)

## 2017-03-22 LAB — CARCINOEMBRYONIC ANTIGEN: CEA: 2.7 ng/mL (ref <=5.0)

## 2017-03-22 LAB — LACTIC ACID LEVEL: LACTIC ACID: 1.1 mmol/L (ref 0.5–2.2)

## 2017-03-22 MED ORDER — MORPHINE 4 MG/ML INTRAVENOUS SOLUTION
4.0000 mg | INTRAVENOUS | Status: DC | PRN
Start: 2017-03-22 — End: 2017-03-24

## 2017-03-22 MED ORDER — IOVERSOL 320 MG IODINE/ML INTRAVENOUS SOLUTION
100.00 mL | INTRAVENOUS | Status: AC
Start: 2017-03-22 — End: 2017-03-22
  Administered 2017-03-22: 100 mL via INTRAVENOUS

## 2017-03-22 MED ORDER — DIATRIZOATE MEGLUMINE-DIATRIZOATE SODIUM 66 %-10 % ORAL SOLUTION
8.00 mL | ORAL | Status: AC
Start: 2017-03-22 — End: 2017-03-22
  Administered 2017-03-22: 8 mL via ORAL

## 2017-03-22 MED ORDER — ELECTROLYTE-A INTRAVENOUS SOLUTION
INTRAVENOUS | Status: DC
Start: 2017-03-22 — End: 2017-03-23

## 2017-03-22 MED ORDER — SODIUM CHLORIDE 0.9 % (FLUSH) INJECTION SYRINGE
2.0000 mL | INJECTION | INTRAMUSCULAR | Status: DC | PRN
Start: 2017-03-22 — End: 2017-03-24

## 2017-03-22 MED ORDER — HYDROCODONE 5 MG-ACETAMINOPHEN 325 MG TABLET
1.0000 | ORAL_TABLET | ORAL | Status: DC | PRN
Start: 2017-03-22 — End: 2017-03-24

## 2017-03-22 MED ORDER — SODIUM CHLORIDE 0.9 % (FLUSH) INJECTION SYRINGE
2.0000 mL | INJECTION | Freq: Three times a day (TID) | INTRAMUSCULAR | Status: DC
Start: 2017-03-22 — End: 2017-03-24
  Administered 2017-03-22: 0 mL
  Administered 2017-03-23 (×2): 2 mL
  Administered 2017-03-24: 0 mL

## 2017-03-22 MED ORDER — HYDROCODONE 5 MG-ACETAMINOPHEN 325 MG TABLET
2.0000 | ORAL_TABLET | ORAL | Status: DC | PRN
Start: 2017-03-22 — End: 2017-03-24

## 2017-03-22 NOTE — Nurses Notes (Signed)
Paged Peter Minium ([phone]) [phone]- Remer- pt states he feels abdomen ballooning back out.

## 2017-03-22 NOTE — H&P (Signed)
[name]  Surg Onc H+P    Kenneth Weeks, Kenneth Weeks, 67 y.o. male  Date of Birth:  06-Mar-1950  Date of service: 03/22/2017    Information Obtained from: patient  Chief Complaint: abdominal pain    PCP: Viona Gilmore, MD    Kenneth Weeks is a 67 y.o., White male who presents with abdominal pain and chronic ventral hernia. He underwent a lap right hemicolectomy in 2016 for colon cancer and reportedly developed an incisional hernia 2 days after surgery. The hernia bothers him from time to time, and gets bigger and smaller, but this is the worst episode of pain he has ever had. He was throwing up all day on Saturday and has not had a bowel movements since that time, but is still passing flatus.     He has a history of suspected Marfan's, history of mechnical aortic valve on warfarin.         ROS:  MUST comment on all "Abnormal" findings   ROS Other than ROS in the HPI, all other systems were negative.    PAST MEDICAL/ FAMILY/ SOCIAL HISTORY:       Past Medical History:   Diagnosis Date   . A-fib (CMS HCC)     Chronic anti-coagulant therapy; Hx cardioversions   . Abdominal hernia     Ventral hernia; Left Inguinal hernia   . Acute on chronic renal failure (CMS HCC)    . Biliary and gallbladder disorder     "Gallstones"   . Cancer (CMS [facility])     Colon Cancer   . Congestive heart failure (CMS HCC)    . Diabetes mellitus (CMS [place])     type ii diet controlled   . Dysrhythmias     a fib   . Hemorrhage of gastrointestinal tract 5/16   . Hemorrhagic shock (CMS HCC)    . Hepatic cirrhosis (CMS HCC)    . Hydrocele    . Hypertension 07/10/2014   . Marfan syndrome    . Muscle weakness     Generalized   . Obesity    . Syncope    . Thyroid disease     hypothyroid   . Transfusion history     s/p Multiple units PRC/FFP this adm.   . Valvular disease     St Judes aortic valve replacement-chronic anti-coagulant therapy   . Wears glasses          Allergies   Allergen Reactions   . Allegra [Fexofenadine]  Other  Adverse Reaction (Add comment)     "retain fluid"     Medications Prior to Admission     Prescriptions    cholecalciferol, vitamin D3, 1,000 unit Oral Tablet    Take 1,000 Units by mouth Once a day    docusate sodium (COLACE) 100 mg Oral Capsule    Take 1 Cap (100 mg total) by mouth Twice daily    eplerenone (INSPRA) 25 mg Oral Tablet    Take 1 Tab (25 mg total) by mouth Once a day    furosemide (LASIX) 40 mg Oral Tablet    Take 1 Tab (40 mg total) by mouth Once a day    levothyroxine (SYNTHROID) 200 mcg Oral Tablet    TAKE ONE TABLET BY MOUTH ONCE DAILY    lisinopril (PRINIVIL) 5 mg Oral Tablet    Take 1 Tab (5 mg total) by mouth Once a day    MAGNESIUM CARBONATE ORAL    Take by mouth  Once a day    metoprolol succinate (TOPROL-XL) 100 mg Oral Tablet Sustained Release 24 hr    Take 100 mg by mouth Once a day    vitamin B complex Oral Tablet    Take 1 Tab by mouth Once a day    warfarin (COUMADIN) 5 mg Oral Tablet    Take 1.5 Tabs (7.5 mg total) by mouth Once a day    Patient taking differently:  Take 7.5 mg by mouth Once a day To be adjusted by following provider           No current facility-administered medications for this encounter.   Past Surgical History:   Procedure Laterality Date   . COLONOSCOPY     . HX COLONOSCOPY     . HX HEART CATHETERIZATION     . [place]    st judes    . HX HEMICOLECTOMY Right 07/13/2014    Laparoscopic right hemicolectomy for colon cancer   . HX TONSIL AND ADENOIDECTOMY     . HX TONSILLECTOMY     . HX UPPER ENDOSCOPY  07/09/14   . HX WISDOM TEETH EXTRACTION           Family Medical History:     Problem Relation (Age of Onset)    Cirrhosis Brother    Hypertension Mother    Lung Cancer Father            Social History     Tobacco Use   . Smoking status: Never Smoker   . Smokeless tobacco: Never Used   Substance Use Topics   . Alcohol use: No     Alcohol/week: 0.0 oz   . Drug use: No         PHYSICAL EXAMINATION: MUST comment on all "Abnormal" findings    Exam  There  were no vitals taken for this visit.    No acute distress  Regular rate and rhythm  Non-labored breathing on room air  Soft, focally tender, hernia defect palpated, non-distended  WWP    Labs Ordered/ Reviewed (Please indicate ordered or reviewed)   Reviewed: Labs:  Lab Results Today:    Results for orders placed or performed in visit on 03/22/17 (from the past 24 hour(s))   Comprehensive Metabolic Panel, Nf   Result Value Ref Range    SODIUM 133 (L) 136 - 145 mmol/L    POTASSIUM 4.3 3.5 - 5.1 mmol/L    CHLORIDE 99 96 - 111 mmol/L    CO2 TOTAL 26 22 - 32 mmol/L    ANION GAP 8 4 - 13 mmol/L    BUN 24 8 - 25 mg/dL    CREATININE 1.08 0.62 - 1.27 mg/dL    BUN/CREA RATIO 22 6 - 22    ESTIMATED GFR >59 >59 mL/min/1.21m^2    ALBUMIN 3.6 3.4 - 4.8 g/dL    CALCIUM 9.6 8.5 - 10.2 mg/dL    GLUCOSE 131 65 - 139 mg/dL    ALKALINE PHOSPHATASE 73 <150 U/L    ALT (SGPT) 17 <55 U/L    AST (SGOT) 23 8 - 48 U/L    BILIRUBIN TOTAL 3.1 (H) 0.3 - 1.3 mg/dL    PROTEIN TOTAL 8.1 (H) 6.0 - 8.0 g/dL   Carcinoembryonic Antigen   Result Value Ref Range    CEA 2.7 <=5.0 ng/mL   CBC WITH DIFF   Result Value Ref Range    WBC 9.4 3.5 - 11.0 x10^3/uL  RBC 5.05 4.06 - 5.63 x10^6/uL    HGB 15.2 12.5 - 16.3 g/dL    HCT 45.9 36.7 - 47.0 %    MCV 91.0 78.0 - 100.0 fL    MCH 30.1 27.4 - 33.0 pg    MCHC 33.0 32.5 - 35.8 g/dL    RDW 16.9 (H) 12.0 - 15.0 %    PLATELETS 161 140 - 450 x10^3/uL    MPV 9.2 7.5 - 11.5 fL    NEUTROPHIL % 88 %    LYMPHOCYTE % 6 %    MONOCYTE % 6 %    EOSINOPHIL % 0 %    BASOPHIL % 0 %    NEUTROPHIL # 8.27 (H) 1.50 - 7.70 x10^3/uL    LYMPHOCYTE # 0.58 (L) 1.00 - 4.80 x10^3/uL    MONOCYTE # 0.52 0.30 - 1.00 x10^3/uL    EOSINOPHIL # 0.00 0.00 - 0.50 x10^3/uL    BASOPHIL # 0.01 0.00 - 0.20 x10^3/uL   Lactic Acid   Result Value Ref Range    LACTIC ACID 1.1 0.5 - 2.2 mmol/L   PT/INR   Result Value Ref Range    PROTHROMBIN TIME 43.1 (H) 9.5 - 14.1 seconds    INR 3.80 (H) 0.80 - 1.20   Results for orders placed or performed during the  hospital encounter of 03/22/17 (from the past 24 hour(s))   POC ISTAT CREATININE (RESULT)   Result Value Ref Range    CREATININE, POC 1.10 0.62 - 1.27 mg/dl       Radiology Tests Ordered/ Reviewed (Please indicate ordered or reviewed)   CT CAP    IMPRESSION:  1.  Small bowel containing ventral abdominal hernias with upstream dilated  loops of small bowel. Findings are concerning for partial or developing  obstruction. Continued radiographic follow-up is recommended.  2.  Stable postoperative changes from right hemicolectomy for colonic  adenocarcinoma.  3.  Decreased mediastinal adenopathy. Mildly prominent periaortic and  inguinal lymph nodes are stable.  4.  Cholelithiasis.    Assessment & Plan:  67 year old male with a history of mechanical aortic valve on warfarin, history of lap right hemicolectomy on 2016, presenting with likely chronic incarcerated ventral hernia.    At least partially reduced during exam  NG Tube if he vomits  mIVF  Pain control  Will need heparin gtt if once INR is no longer supratherapeutic    Marquette Old, MD 03/22/2017, 17:51      Addendum:    I saw and examined this patient with the resident above.  Please see the resident's note, which I have carefully reviewed, for full details. I agree with the findings and plan of care as documented in the resident's note.      Floreen Comber, MD  Colorectal Surgery

## 2017-03-22 NOTE — Cancer Center Note (Signed)
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     Significant weight Gain and DOE.   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  - Repeat ECHO showed EF of 45% . MIdly depressed RV systolic dysfunction suggestive of Pulm HTN. Referred to cardiology.  - On inspra and lasix now. Management as per cardiology.     Uncontrolled DM type 2  -Currently controlled by diet only, previously on Metformin  -Follows with PCP     Incisional Hernia  -Evaluated by Dr. Catarina Hartshorn 02/2016  for consideration of hernia repair.  -He was felt to be not a candidate for elective repair based on his elevated A1c level, his Marfan's disease and his obesity.  -Concern for obstruction with worsening pain, non-reducible on clinical exam and dilated small bowel loops on today's imaging. I contact Dr. Gwinda Passe of surgical oncology who will evaluate patient right away.    Cirrhosis of Liver  -AST/ALT is normal but T bilirubin trending upto 2.6  -[place] RUQ 01/24/2015 showed no evidence to suggest cirrhosis. Previous US Liver in 06/2014 reported cirrhosis of liver. He has no clinical stigmata of  cirrhosis  -Will have him follow with GI hepatology for cirrhosis if needed    Mechanical aortic valve (1999)   -on anticoagulation, coumadin managed by PCP. INR today is 1.92  -on coumadin 7.5 mg daily.    HTN:   -BP controlled.      RTC in 6 months with repeat scans.      Ardeth Sportsman, MD

## 2017-03-22 NOTE — Nurses Notes (Signed)
Patient educated on call bell system and states understanding. Patient educated to "call, not fall" and states understanding. Patient's questions and concerns acknowledged and answered at this time. Patient complained of intermittent sharp mid abdominal pain rated 5/10 that is alleviated by position; rest provided at this time. Patient assessments completed per flowsheet. Medications given per MAR. Patient stable at this time. Will continue to monitor and will update MD as needed.

## 2017-03-23 DIAGNOSIS — K436 Other and unspecified ventral hernia with obstruction, without gangrene: Secondary | ICD-10-CM | POA: Diagnosis present

## 2017-03-23 LAB — BASIC METABOLIC PANEL
ANION GAP: 9 mmol/L (ref 4–13)
BUN/CREA RATIO: 24 — ABNORMAL HIGH (ref 6–22)
BUN: 24 mg/dL (ref 8–25)
CALCIUM: 8.9 mg/dL (ref 8.5–10.2)
CHLORIDE: 102 mmol/L (ref 96–111)
CO2 TOTAL: 26 mmol/L (ref 22–32)
CREATININE: 1.02 mg/dL (ref 0.62–1.27)
ESTIMATED GFR: >59 mL/min/{1.73_m2} (ref >59)
GLUCOSE: 106 mg/dL (ref 65–139)
POTASSIUM: 3.8 mmol/L (ref 3.5–5.1)
SODIUM: 137 mmol/L (ref 136–145)

## 2017-03-23 LAB — CBC WITH DIFF
BASOPHIL #: 0.02 10*3/uL (ref 0.00–0.20)
BASOPHIL %: 0 %
EOSINOPHIL #: 0.02 10*3/uL (ref 0.00–0.50)
EOSINOPHIL %: 0 %
HCT: 41.5 % (ref 36.7–47.0)
HGB: 13.7 g/dL (ref 12.5–16.3)
LYMPHOCYTE #: 1.12 10*3/uL (ref 1.00–4.80)
LYMPHOCYTE %: 15 %
MCH: 30.1 pg (ref 27.4–33.0)
MCHC: 33 g/dL (ref 32.5–35.8)
MCV: 91 fL (ref 78.0–100.0)
MONOCYTE #: 0.56 10*3/uL (ref 0.30–1.00)
MONOCYTE %: 8 %
MPV: 9.7 fL (ref 7.5–11.5)
NEUTROPHIL #: 5.73 10*3/uL (ref 1.50–7.70)
NEUTROPHIL %: 77 %
PLATELETS: 138 10*3/uL — ABNORMAL LOW (ref 140–450)
RBC: 4.56 10*6/uL (ref 4.06–5.63)
RDW: 16.8 % — ABNORMAL HIGH (ref 12.0–15.0)
WBC: 7.5 10*3/uL (ref 3.5–11.0)

## 2017-03-23 LAB — PT/INR: INR: 4.15 — ABNORMAL HIGH (ref 0.80–1.20)

## 2017-03-23 LAB — PHOSPHORUS: PHOSPHORUS: 2.9 mg/dL (ref 2.3–4.0)

## 2017-03-23 LAB — HGA1C (HEMOGLOBIN A1C WITH EST AVG GLUCOSE)
ESTIMATED AVERAGE GLUCOSE: 126 mg/dL
HEMOGLOBIN A1C: 6 % (ref 4.0–6.0)

## 2017-03-23 MED ORDER — DOCUSATE SODIUM 100 MG CAPSULE
100.0000 mg | ORAL_CAPSULE | Freq: Two times a day (BID) | ORAL | Status: DC
Start: 2017-03-23 — End: 2017-03-24
  Administered 2017-03-23 – 2017-03-24 (×3): 100 mg via ORAL
  Filled 2017-03-23 (×3): qty 1

## 2017-03-23 MED ORDER — LEVOTHYROXINE 200 MCG TABLET
200.0000 ug | ORAL_TABLET | Freq: Every morning | ORAL | Status: DC
Start: 2017-03-24 — End: 2017-03-24
  Administered 2017-03-24: 200 ug via ORAL
  Filled 2017-03-23 (×2): qty 1

## 2017-03-23 MED ORDER — METOPROLOL SUCCINATE ER 100 MG TABLET,EXTENDED RELEASE 24 HR
100.0000 mg | ORAL_TABLET | Freq: Every day | ORAL | Status: DC
Start: 2017-03-23 — End: 2017-03-24
  Administered 2017-03-23 – 2017-03-24 (×2): 100 mg via ORAL
  Filled 2017-03-23 (×2): qty 1

## 2017-03-23 MED ORDER — EPLERENONE 25 MG TABLET
25.0000 mg | ORAL_TABLET | Freq: Every day | ORAL | Status: DC
Start: 2017-03-24 — End: 2017-03-24
  Administered 2017-03-24: 25 mg via ORAL
  Filled 2017-03-23: qty 1

## 2017-03-23 MED ORDER — FUROSEMIDE 40 MG TABLET
40.0000 mg | ORAL_TABLET | Freq: Every day | ORAL | Status: DC
Start: 2017-03-24 — End: 2017-03-24
  Administered 2017-03-24: 40 mg via ORAL
  Filled 2017-03-23: qty 1

## 2017-03-23 MED ORDER — LISINOPRIL 5 MG TABLET
5.0000 mg | ORAL_TABLET | Freq: Every day | ORAL | Status: DC
Start: 2017-03-24 — End: 2017-03-24
  Administered 2017-03-24: 5 mg via ORAL
  Filled 2017-03-23: qty 1

## 2017-03-23 NOTE — Nurses Notes (Signed)
Patient educated on call bell system and states understanding. Patient educated to "call, not fall" and states understanding. Patient's questions and concerns acknowledged and answered at this time. Patient has no complaints at this time. Patient assessments completed per flowsheet. Medications given per MAR. Patient stable at this time. Will continue to monitor and will update MD as needed.

## 2017-03-23 NOTE — Care Plan (Signed)
Problem: Adult Inpatient Plan of Care  Goal: Plan of Care Review  Outcome: Ongoing (see interventions/notes)  Goal: Patient-Specific Goal (Individualization)  Outcome: Ongoing (see interventions/notes)  Goal: Absence of Hospital-Acquired Illness or Injury  Outcome: Ongoing (see interventions/notes)  Goal: Optimal Comfort and Wellbeing  Outcome: Ongoing (see interventions/notes)  Goal: Rounds/Family Conference  Outcome: Ongoing (see interventions/notes)     Problem: Health Knowledge, Opportunity to Enhance (Adult,Obstetrics,Pediatric)  Goal: Knowledgeable about Health Subject/Topic  Description  Patient will demonstrate the desired outcomes by discharge/transition of care.  Outcome: Ongoing (see interventions/notes)   Pt tolerated clear liquid diet, service spoke of advancing diet to soft this afternoon. IV fluids HL.

## 2017-03-23 NOTE — Ancillary Notes (Signed)
Diabetes Education    Stopped to assess diabetes education needs. Dois [name] states that he has had diabetes for about 3-4 years. The patient states that his diabetes has been diet controlled for the past couple years. The patient stated that he has a meter and tests glucose daily at home. The patient states that he ranges 80-125 mg/dL. Current A1c   Lab Results   Component Value Date    HA1C 6.0 03/23/2017    HA1C 5.5 07/10/2014     Reviewed A1c, meaning of value and ADA goal. The patient denies issues with obtaining testing supplies or medications. We reviewed hypoglycemia. We reviewed progression of disease, ADA BG targets and the importance of testing and avoidance of complications by managing blood sugars. The patient shared that he follows with Dr. Viona Gilmore, MD for diabetes. Answered all questions. Verbalized understanding. Contact information provided.     Kenneth Budge, RN, BSN  Diabetes [place]  Pager [phone]

## 2017-03-23 NOTE — Care Management Notes (Signed)
CCC attempted to see pt x 2 today but pt was sleeping soundly. CCC will follow up in am.

## 2017-03-23 NOTE — Progress Notes (Signed)
[facility]  Surgical Oncology Progress Note    Theus, Espin, 67 y.o. male  Date of Birth:  1950/11/08  Date of Admission:  03/22/2017  Date of service: 03/23/2017    Chief Complaint: Abdominal pain   Subjective: doing well thsi morning, pain resolved had 2 BMs overnight     Vital Signs:  Temp (24hrs) Max:37 C (16.1 F)      Systolic (09UEA), VWU:981 , Min:105 , XBJ:478     Diastolic (29FAO), ZHY:86, Min:66, Max:100    Temp  Avg: 36.6 C (97.9 F)  Min: 36.3 C (97.3 F)  Max: 37 C (98.6 F)  Pulse  Avg: 99.5  Min: 92  Max: 112  Resp  Avg: 17.2  Min: 14  Max: 18  SpO2  Avg: 95.8 %  Min: 94 %  Max: 97 %  Pain Score (Numeric, Faces): 0    Today's Physical Exam:  Temperature: 36.6 C (97.9 F)  Heart Rate: 92  BP (Non-Invasive): 115/68  Respiratory Rate: 14  SpO2-1: 96 %  Pain Score (Numeric, Faces): 0  Constitutional: appears in good health  Eyes: Conjunctiva clear.  ENT: ENMT without erythema or injection, mucous membranes moist.  Neck: no thyromegaly or lymphadenopathy  Respiratory: non labored  Cardiovascular: regular rate and rhythm  Gastrointestinal: Soft, non-tender, small midline hernia reducible   Genitourinary: Deferred  Musculoskeletal: Head atraumatic and normocephalic  Integumentary:  Skin warm and dry  Neurologic: Grossly normal  Lymphatic/Immunologic/Hematologic: No lymphadenopathy  Psychiatric: Normal        Current Medications:    Current Facility-Administered Medications:  electrolyte-A (PLASMALYTE-A) premix infusion  Intravenous Continuous   HYDROcodone-acetaminophen (NORCO) 5-325 mg per tablet 1 Tab Oral Q4H PRN   HYDROcodone-acetaminophen (NORCO) 5-325 mg per tablet 2 Tab Oral Q4H PRN   morphine 4 mg/mL injection 4 mg Intravenous Q4H PRN   NS flush syringe 2 mL Intracatheter Q8HRS   And      NS flush syringe 2-6 mL Intracatheter Q1 MIN PRN       I/O:  I/O last 24 hours:      Intake/Output Summary (Last 24 hours) at 03/23/2017 0914  Last data filed at 03/23/2017 0700  Gross per 24  hour   Intake 1281.7 ml   Output 550 ml   Net 731.7 ml     I/O current shift:  02/12 0700 - 02/12 1859  In: 100 [I.V.:100]  Out: -     Prophylaxis:  Date Started Date Completed   DVT/PE  coumadin     GI: Not indicated       Nutrition/Residuals:  DIET CLEAR LIQUID    Labs  (Please indicate ordered or reviewed)  Reviewed: Lab Results for Last 24 Hours:  Results for orders placed or performed during the hospital encounter of 03/22/17 (from the past 24 hour(s))   PHOSPHORUS   Result Value Ref Range    PHOSPHORUS 2.9 2.3 - 4.0 mg/dL   BASIC METABOLIC PANEL   Result Value Ref Range    SODIUM 137 136 - 145 mmol/L    POTASSIUM 3.8 3.5 - 5.1 mmol/L    CHLORIDE 102 96 - 111 mmol/L    CO2 TOTAL 26 22 - 32 mmol/L    ANION GAP 9 4 - 13 mmol/L    CALCIUM 8.9 8.5 - 10.2 mg/dL    GLUCOSE 106 65 - 139 mg/dL    BUN 24 8 - 25 mg/dL    CREATININE 1.02 0.62 - 1.27 mg/dL  BUN/CREA RATIO 24 (H) 6 - 22    ESTIMATED GFR >59 >59 mL/min/1.60m^2   MAGNESIUM   Result Value Ref Range    MAGNESIUM 1.8 1.6 - 2.5 mg/dL   PT/INR   Result Value Ref Range    PROTHROMBIN TIME 47.0 (H) 9.5 - 14.1 seconds    INR 4.15 (H) 0.80 - 1.20   CBC WITH DIFF   Result Value Ref Range    WBC 7.5 3.5 - 11.0 x10^3/uL    RBC 4.56 4.06 - 5.63 x10^6/uL    HGB 13.7 12.5 - 16.3 g/dL    HCT 41.5 36.7 - 47.0 %    MCV 91.0 78.0 - 100.0 fL    MCH 30.1 27.4 - 33.0 pg    MCHC 33.0 32.5 - 35.8 g/dL    RDW 16.8 (H) 12.0 - 15.0 %    PLATELETS 138 (L) 140 - 450 x10^3/uL    MPV 9.7 7.5 - 11.5 fL    NEUTROPHIL % 77 %    LYMPHOCYTE % 15 %    MONOCYTE % 8 %    EOSINOPHIL % 0 %    BASOPHIL % 0 %    NEUTROPHIL # 5.73 1.50 - 7.70 x10^3/uL    LYMPHOCYTE # 1.12 1.00 - 4.80 x10^3/uL    MONOCYTE # 0.56 0.30 - 1.00 x10^3/uL    EOSINOPHIL # 0.02 0.00 - 0.50 x10^3/uL    BASOPHIL # 0.02 0.00 - 0.20 x10^3/uL     Assessment/ Plan:  67 yo male w/ hx of mechanical heart valve on coumadin who presented with incarcerated ventral hernia reduced on admission    Doing well, advance diet to clears this  morning, potentially regular this evening  Continue to hold coumadin given INR of 4  OOB  Plan for potential DC tomorrow with elective general surgery follow up for hernia repair in the near future   Cont floor status     Tad Moore, DO 03/23/2017, 09:18  Rickard Rhymes, DO  PGY-5 General Surgery    Addendum:    I saw and examined this patient with the resident above.  Please see the resident's note, which I have carefully reviewed, for full details. I agree with the findings and plan of care as documented in the resident's note.  Any additions/exceptions are edited/noted.    -Feeling much better today.  No nausea; having bowel movements.  Tolerating clear liquids.  -If continues to feel good can try regular diet this evening.   -Hold coumadin and recheck tomorrow (Goal INR 2.5-3.5).    Floreen Comber, MD 03/23/2017 11:58  Colorectal Surgery

## 2017-03-24 LAB — CBC WITH DIFF
BASOPHIL #: 0.04 x10ˆ3/uL (ref 0.00–0.20)
BASOPHIL %: 1 %
EOSINOPHIL #: 0.05 10*3/uL (ref 0.00–0.50)
EOSINOPHIL %: 1 %
HCT: 38.4 % (ref 36.7–47.0)
HGB: 12.7 g/dL (ref 12.5–16.3)
LYMPHOCYTE #: 1.07 x10ˆ3/uL (ref 1.00–4.80)
LYMPHOCYTE %: 19 %
MCH: 30.2 pg (ref 27.4–33.0)
MCHC: 33 g/dL (ref 32.5–35.8)
MCV: 91.7 fL (ref 78.0–100.0)
MONOCYTE #: 0.48 x10ˆ3/uL (ref 0.30–1.00)
MONOCYTE %: 9 %
MPV: 9.4 fL (ref 7.5–11.5)
NEUTROPHIL #: 3.93 x10ˆ3/uL (ref 1.50–7.70)
NEUTROPHIL %: 71 %
PLATELETS: 124 x10ˆ3/uL — ABNORMAL LOW (ref 140–450)
RBC: 4.19 x10ˆ6/uL (ref 4.06–5.63)
RDW: 16.9 % — ABNORMAL HIGH (ref 12.0–15.0)
WBC: 5.6 x10ˆ3/uL (ref 3.5–11.0)

## 2017-03-24 LAB — PT/INR
INR: 3.58 — ABNORMAL HIGH (ref 0.80–1.20)
PROTHROMBIN TIME: 40.7 seconds — ABNORMAL HIGH (ref 9.5–14.1)

## 2017-03-24 LAB — BASIC METABOLIC PANEL
ANION GAP: 8 mmol/L (ref 4–13)
BUN/CREA RATIO: 21 (ref 6–22)
BUN: 19 mg/dL (ref 8–25)
CALCIUM: 8.8 mg/dL (ref 8.5–10.2)
CHLORIDE: 102 mmol/L (ref 96–111)
CO2 TOTAL: 25 mmol/L (ref 22–32)
CREATININE: 0.92 mg/dL (ref 0.62–1.27)
ESTIMATED GFR: >59 mL/min/1.73mˆ2 (ref >59)
ESTIMATED GFR: >59 mL/min/{1.73_m2} (ref >59)
GLUCOSE: 102 mg/dL (ref 65–139)
POTASSIUM: 3.9 mmol/L (ref 3.5–5.1)
SODIUM: 135 mmol/L — ABNORMAL LOW (ref 136–145)

## 2017-03-24 NOTE — Care Plan (Signed)
Patient free from falls this shift. Patient shows no new signs/symptoms of skin breakdown. Patient has no complaints of pain, nausea, vomiting, diarrhea, or shortness of breath this shift. Assessments completed per Flowsheets. Medications given per MAR. Will continue to monitor and will notify MD as needed.      Problem: Fall Injury Risk  Goal: Absence of Fall and Fall-Related Injury  Outcome: Ongoing (see interventions/notes)

## 2017-03-24 NOTE — Progress Notes (Signed)
[facility]  Surgical Oncology Progress Note    Kenneth Weeks, Kenneth Weeks, 67 y.o. male  Date of Birth:  November 12, 1950  Date of Admission:  03/22/2017  Date of service: 03/24/2017    Chief Complaint: Abdominal pain   Subjective: doing well this morning, Tolerating a regular diet.  Abdominal pain is controlled.      Vital Signs:  Temp (24hrs) Max:36.6 C (17.5 F)      Systolic (10CHE), NID:782 , Min:100 , UMP:536     Diastolic (14ERX), VQM:08, Min:64, Max:73    Temp  Avg: 36.4 C (97.6 F)  Min: 36.3 C (97.3 F)  Max: 36.6 C (97.9 F)  Pulse  Avg: 97.5  Min: 92  Max: 100  Resp  Avg: 16.7  Min: 14  Max: 18  SpO2  Avg: 95.8 %  Min: 93 %  Max: 97 %  Pain Score (Numeric, Faces): 0    Today's Physical Exam:  Temperature: 36.5 C (97.7 F)  Heart Rate: 98  BP (Non-Invasive): 100/66  Respiratory Rate: 16  SpO2-1: 93 %  Pain Score (Numeric, Faces): 0  Constitutional: appears in good health  Neck: no thyromegaly or lymphadenopathy  Respiratory: non labored  Cardiovascular: regular rate and rhythm  Gastrointestinal: Soft, non-tender, small midline hernia reducible   Genitourinary: Deferred  Musculoskeletal: Head atraumatic and normocephalic  Integumentary:  Skin warm and dry    Current Medications:    Current Facility-Administered Medications:  docusate sodium (COLACE) capsule 100 mg Oral 2x/day   eplerenone (INSPRA) tablet 25 mg Oral Daily   furosemide (LASIX) tablet 40 mg Oral Daily   HYDROcodone-acetaminophen (NORCO) 5-325 mg per tablet 1 Tab Oral Q4H PRN   HYDROcodone-acetaminophen (NORCO) 5-325 mg per tablet 2 Tab Oral Q4H PRN   levothyroxine (SYNTHROID) tablet 200 mcg Oral QAM   lisinopril (PRINIVIL) tablet 5 mg Oral Daily   metoprolol succinate (TOPROL-XL) 24 hr extended release tablet 100 mg Oral Daily   morphine 4 mg/mL injection 4 mg Intravenous Q4H PRN   NS flush syringe 2 mL Intracatheter Q8HRS   And      NS flush syringe 2-6 mL Intracatheter Q1 MIN PRN       I/O:  I/O last 24 hours:      Intake/Output Summary  (Last 24 hours) at 03/24/2017 0711  Last data filed at 03/24/2017 0331  Gross per 24 hour   Intake 2144 ml   Output --   Net 2144 ml     I/O current shift:  No intake/output data recorded.    Prophylaxis:  Date Started Date Completed   DVT/PE  coumadin     GI: Not indicated       Nutrition/Residuals:  DIET REGULAR Consistency/thickening: MECHANICAL SOFT (Ground meat/Soft foods)    Labs  (Please indicate ordered or reviewed)  Reviewed: Lab Results for Last 24 Hours:    Results for orders placed or performed during the hospital encounter of 03/22/17 (from the past 24 hour(s))   HGA1C (HEMOGLOBIN A1C WITH EST AVG GLUCOSE)   Result Value Ref Range    HEMOGLOBIN A1C 6.0 4.0 - 6.0 %    ESTIMATED AVERAGE GLUCOSE 126 mg/dL   BASIC METABOLIC PANEL   Result Value Ref Range    SODIUM 135 (L) 136 - 145 mmol/L    POTASSIUM 3.9 3.5 - 5.1 mmol/L    CHLORIDE 102 96 - 111 mmol/L    CO2 TOTAL 25 22 - 32 mmol/L    ANION GAP 8 4 - 13  mmol/L    CALCIUM 8.8 8.5 - 10.2 mg/dL    GLUCOSE 102 65 - 139 mg/dL    BUN 19 8 - 25 mg/dL    CREATININE 0.92 0.62 - 1.27 mg/dL    BUN/CREA RATIO 21 6 - 22    ESTIMATED GFR >59 >59 mL/min/1.79m^2   PT/INR   Result Value Ref Range    PROTHROMBIN TIME 40.7 (H) 9.5 - 14.1 seconds    INR 3.58 (H) 0.80 - 1.20   CBC WITH DIFF   Result Value Ref Range    WBC 5.6 3.5 - 11.0 x10^3/uL    RBC 4.19 4.06 - 5.63 x10^6/uL    HGB 12.7 12.5 - 16.3 g/dL    HCT 38.4 36.7 - 47.0 %    MCV 91.7 78.0 - 100.0 fL    MCH 30.2 27.4 - 33.0 pg    MCHC 33.0 32.5 - 35.8 g/dL    RDW 16.9 (H) 12.0 - 15.0 %    PLATELETS 124 (L) 140 - 450 x10^3/uL    MPV 9.4 7.5 - 11.5 fL    NEUTROPHIL % 71 %    LYMPHOCYTE % 19 %    MONOCYTE % 9 %    EOSINOPHIL % 1 %    BASOPHIL % 1 %    NEUTROPHIL # 3.93 1.50 - 7.70 x10^3/uL    LYMPHOCYTE # 1.07 1.00 - 4.80 x10^3/uL    MONOCYTE # 0.48 0.30 - 1.00 x10^3/uL    EOSINOPHIL # 0.05 0.00 - 0.50 x10^3/uL    BASOPHIL # 0.04 0.00 - 0.20 x10^3/uL     Assessment/ Plan:  67 yo male w/ hx of mechanical heart valve on  coumadin who presented with incarcerated ventral hernia reduced on admission    Doing well overall.    Held coumadin last night, INR today of 3.58  OOB  Plan for  DC today.    Will plan for elective general surgery follow up for hernia repair in the near future      Idelle Jo, PA-C  03/24/2017, 07:14      Addendum:    I did not see the patient on this date.  The PA saw the patient independently, and we discussed the plan together.  I agree with the findings and plan of care as documented in the midlevel's note.      Floreen Comber, MD  Colorectal Surgery

## 2017-03-24 NOTE — Care Management Notes (Signed)
MSW f/u on in basket alert for advanced directives. Met with patient and provided educational packet with forms. Patient did not wish to complete forms tonight. Patient advised of numbers to reach care manager if later wished to have forms completed here at hospital.

## 2017-03-24 NOTE — Discharge Summary (Signed)
[facility]  DISCHARGE SUMMARY    PATIENT NAME:  Kenneth Weeks, Kenneth Weeks  MRN:  [id number]  DOB:  [birth year]    ENCOUNTER DATE:  03/22/2017  INPATIENT ADMISSION DATE: 03/22/2017  DISCHARGE DATE:  03/24/2017    ATTENDING PHYSICIAN: Floreen Comber, MD  SERVICE: SURG ONCOLOGY  PRIMARY CARE PHYSICIAN: Viona Gilmore, MD     PRIMARY DISCHARGE DIAGNOSIS:   Active Hospital Problems    Diagnosis Date Noted   . Ventral hernia with obstruction and without gangrene 03/23/2017      Resolved Hospital Problems   No resolved problems to display.     Active Non-Hospital Problems    Diagnosis Date Noted   . Diabetes (CMS [place]) 03/05/2016   . Valvular disease    . Abdominal hernia    . Chronic anticoagulation 10/07/2015   . History of colon cancer, stage I 10/07/2015   . Ventral incisional hernia 10/07/2015   . Left inguinal hernia 10/07/2015   . Pulmonary nodules 08/30/2015   . Colon cancer (CMS [place]) 07/13/2014   . Cirrhosis of liver (CMS HCC) 07/13/2014   . Edema of both legs 07/13/2014   . Marfan syndrome 07/10/2014   . Hypertension 07/10/2014   . Congestive heart failure (CMS HCC) 07/10/2014   . History of mechanical aortic valve replacement 07/10/2014   . Hypothyroidism 07/10/2014   . Chronic atrial fibrillation (CMS HCC) 07/10/2014   . Monitoring for long-term anticoagulant use 05/30/2014   . Hydrocele 07/30/2010        DISCHARGE MEDICATIONS:     Current Discharge Medication List      CONTINUE these medications which have CHANGED during your visit.      Details   warfarin 5 mg Tablet  Commonly known as:  COUMADIN  What changed:  additional instructions   7.5 mg, Oral, DAILY  Qty:  140 Tab  Refills:  4        CONTINUE these medications - NO CHANGES were made during your visit.      Details   cholecalciferol (vitamin D3) 1,000 unit Tablet   1,000 Units, Oral, DAILY  Refills:  0     docusate sodium 100 mg Capsule  Commonly known as:  COLACE   100 mg, Oral, 2 TIMES DAILY  Refills:  0     eplerenone 25 mg Tablet  Commonly known as:   INSPRA   25 mg, Oral, DAILY  Qty:  90 Tab  Refills:  1     furosemide 40 mg Tablet  Commonly known as:  LASIX   40 mg, Oral, DAILY  Qty:  30 Tab  Refills:  0     levothyroxine 200 mcg Tablet  Commonly known as:  SYNTHROID   TAKE ONE TABLET BY MOUTH ONCE DAILY  Qty:  90 Tab  Refills:  3     lisinopril 5 mg Tablet  Commonly known as:  PRINIVIL   5 mg, Oral, DAILY  Qty:  90 Tab  Refills:  1     MAGNESIUM CARBONATE ORAL   Oral, DAILY  Refills:  0     metoprolol succinate 100 mg Tablet Sustained Release 24 hr  Commonly known as:  TOPROL-XL   100 mg, Oral, DAILY  Refills:  0     vitamin B complex Tablet   1 Tab, Oral, DAILY  Refills:  0          Discharge med list refreshed?  YES    ALLERGIES:  Allergies   Allergen  Reactions   . Allegra [Fexofenadine]  Other Adverse Reaction (Add comment)     "retain fluid"     [place]     BRIEF HPI:  This is a 67 y.o., male admitted for  abdominal pain and a chronic ventral hernia. He underwent a lap right hemicolectomy in 2016 for colon cancer and reportedly developed an incisional hernia 2 days after surgery. The hernia bothers him from time to time, and gets bigger and smaller, but this is the worst episode of pain he has ever had.    The patient's hernia was reduced and the patient was given an abdominal binder.  He was stable for discharge on 03/24/2017    He plans to return to see General Surgery in the near future to discuss repair.    `  CONDITION ON DISCHARGE:  A. Ambulation: Full ambulation  B. Self-care Ability: Complete  C. Cognitive Status Alert and Oriented x 3  D. Code status at discharge:   Code Status Information     Code Status    Full Code             LINES/DRAINS/WOUNDS AT DISCHARGE:   Patient Lines/Drains/Airways Status    Active Line / Dialysis Catheter / Dialysis Graft / Drain / Airway / Wound     Name: Placement date: Placement time: Site: Days:    Peripheral IV Anterior;Right;Upper Cephalic  (lateral side of arm)  03/22/17    1855   1                DISCHARGE DISPOSITION:  Home discharge      DISCHARGE INSTRUCTIONS:       Refer to [facility]     DISCHARGE INSTRUCTION - MISC    Use abdominal binder when OOB and ambulating.    No heavy lifting (nothing over 10lbs)          Idelle Jo, PA-C      Copies sent to Care Team       Relationship Specialty Notifications Start End    Viona Gilmore, MD PCP - [facility]  05/11/14     Phone: [phone] Fax: [phone] [address] [place] [address]    Viona Gilmore, MD PCP - Claims Attributed   11/09/14     Set automatically for Most Visit Over 18 Months    Phone: [phone] Fax: [phone]         [address] [facility] [place] [address]          Referring providers can utilize [email or web address] to access their referred [place] patient's information.

## 2017-03-24 NOTE — Nurses Notes (Signed)
Patient discharged home with family.  AVS reviewed with patient/spouse.  A written copy of the AVS and discharge instructions was given to the patient/spouse.  Questions sufficiently answered as needed.  Patient/spouse encouraged to follow up with general surgery as indicated.  In the event of an emergency, patient/spouse instructed to call 911 or go to the nearest emergency room. IV removed intact. Patient ambulatory to lobby with spouse for private transportation home.

## 2017-03-25 ENCOUNTER — Other Ambulatory Visit (INDEPENDENT_AMBULATORY_CARE_PROVIDER_SITE_OTHER): Payer: Self-pay | Admitting: INTERNAL MEDICINE

## 2017-03-25 DIAGNOSIS — I482 Chronic atrial fibrillation, unspecified: Secondary | ICD-10-CM

## 2017-03-25 MED ORDER — FUROSEMIDE 40 MG TABLET
40.0000 mg | ORAL_TABLET | Freq: Every day | ORAL | 5 refills | Status: DC
Start: 2017-03-25 — End: 2017-04-30

## 2017-03-25 NOTE — Telephone Encounter (Signed)
PT/IN

## 2017-03-26 ENCOUNTER — Encounter (INDEPENDENT_AMBULATORY_CARE_PROVIDER_SITE_OTHER): Payer: Self-pay | Admitting: Physician Assistant

## 2017-03-29 ENCOUNTER — Other Ambulatory Visit: Payer: Self-pay | Admitting: Adolescent Medicine

## 2017-03-29 ENCOUNTER — Telehealth: Payer: Self-pay

## 2017-03-29 ENCOUNTER — Ambulatory Visit: Payer: Medicare Other | Attending: INTERNAL MEDICINE

## 2017-03-29 DIAGNOSIS — I482 Chronic atrial fibrillation, unspecified: Secondary | ICD-10-CM

## 2017-03-29 DIAGNOSIS — I4891 Unspecified atrial fibrillation: Secondary | ICD-10-CM

## 2017-03-29 LAB — PT/INR
INR: 2.66
PROTHROMBIN TIME: 31.9 seconds — ABNORMAL HIGH (ref 9.4–12.5)

## 2017-03-29 NOTE — Telephone Encounter (Addendum)
2nd req [facility] ekg 03/10/16    Nuc ST 01/2017 put in chart;  Echo 12/2016 put in chart; ;   TEE/CV 08/27/09 in epic ;;   pcp [facility] user       Dr Seward Speck in [facility]  Last OV 03/15/17;;  AVR done 1999 done in [facility]

## 2017-03-30 ENCOUNTER — Ambulatory Visit (INDEPENDENT_AMBULATORY_CARE_PROVIDER_SITE_OTHER): Payer: Self-pay | Admitting: INTERNAL MEDICINE

## 2017-03-30 NOTE — Progress Notes (Signed)
03/30/17 1000   [facility] PT/INR   Date INR drawn 03/30/17   INR 2.7   New warfarin dosage INR is at goal   New weekly dosgae Keep the same   Comments Check INR in one month   Initials Anabel Bene!   Anticoag Indication VALVE   Goal INR 2.5-3.5

## 2017-03-30 NOTE — Progress Notes (Signed)
Pt notified of INR results and verbalized understanding. Pt has F/U lab appt on 04/27/2017  pt is taking 5mg  tabs, he takes 2 tabs on Monday and Friday and 1 1/2 ROW.  [name], Babb  03/30/2017, 11:23

## 2017-03-31 ENCOUNTER — Ambulatory Visit: Payer: Self-pay | Admitting: Interventional Cardiology

## 2017-04-02 ENCOUNTER — Telehealth: Payer: Self-pay | Admitting: Adolescent Medicine

## 2017-04-02 NOTE — Telephone Encounter (Signed)
Unable to  Reach patient to reschedule appointment from 03/31/17 due office being closed for bad weather. Bad phone number          Marycatherine Kibler

## 2017-04-09 ENCOUNTER — Encounter (INDEPENDENT_AMBULATORY_CARE_PROVIDER_SITE_OTHER): Payer: Self-pay | Admitting: Surgery

## 2017-04-09 ENCOUNTER — Ambulatory Visit
Admission: RE | Admit: 2017-04-09 | Discharge: 2017-04-09 | Disposition: A | Discharge status: Home / Self Care | Payer: Medicare Other | Source: Ambulatory Visit | Attending: Surgery | Admitting: Surgery

## 2017-04-09 ENCOUNTER — Ambulatory Visit (HOSPITAL_COMMUNITY)
Admission: RE | Admit: 2017-04-09 | Discharge: 2017-04-09 | Disposition: A | Discharge status: Home / Self Care | Payer: Medicare Other | Source: Ambulatory Visit

## 2017-04-09 ENCOUNTER — Ambulatory Visit (HOSPITAL_BASED_OUTPATIENT_CLINIC_OR_DEPARTMENT_OTHER): Payer: Medicare Other | Admitting: Surgery

## 2017-04-09 ENCOUNTER — Encounter (HOSPITAL_COMMUNITY): Payer: Self-pay

## 2017-04-09 VITALS — BP 132/82 | HR 97 | Temp 97.7°F | Ht 75.0 in | Wt 268.1 lb

## 2017-04-09 DIAGNOSIS — E1122 Type 2 diabetes mellitus with diabetic chronic kidney disease: Secondary | ICD-10-CM | POA: Insufficient documentation

## 2017-04-09 DIAGNOSIS — K432 Incisional hernia without obstruction or gangrene: Secondary | ICD-10-CM | POA: Insufficient documentation

## 2017-04-09 DIAGNOSIS — E119 Type 2 diabetes mellitus without complications: Secondary | ICD-10-CM

## 2017-04-09 DIAGNOSIS — K769 Liver disease, unspecified: Secondary | ICD-10-CM

## 2017-04-09 DIAGNOSIS — Z952 Presence of prosthetic heart valve: Secondary | ICD-10-CM | POA: Insufficient documentation

## 2017-04-09 DIAGNOSIS — I509 Heart failure, unspecified: Secondary | ICD-10-CM | POA: Insufficient documentation

## 2017-04-09 DIAGNOSIS — I130 Hypertensive heart and chronic kidney disease with heart failure and stage 1 through stage 4 chronic kidney disease, or unspecified chronic kidney disease: Secondary | ICD-10-CM | POA: Insufficient documentation

## 2017-04-09 DIAGNOSIS — E039 Hypothyroidism, unspecified: Secondary | ICD-10-CM | POA: Insufficient documentation

## 2017-04-09 DIAGNOSIS — I4891 Unspecified atrial fibrillation: Secondary | ICD-10-CM | POA: Insufficient documentation

## 2017-04-09 DIAGNOSIS — Z9289 Personal history of other medical treatment: Secondary | ICD-10-CM

## 2017-04-09 DIAGNOSIS — Q8740 Marfan's syndrome, unspecified: Secondary | ICD-10-CM | POA: Insufficient documentation

## 2017-04-09 DIAGNOSIS — Z85038 Personal history of other malignant neoplasm of large intestine: Secondary | ICD-10-CM | POA: Insufficient documentation

## 2017-04-09 DIAGNOSIS — N189 Chronic kidney disease, unspecified: Secondary | ICD-10-CM | POA: Insufficient documentation

## 2017-04-09 DIAGNOSIS — K469 Unspecified abdominal hernia without obstruction or gangrene: Secondary | ICD-10-CM

## 2017-04-09 DIAGNOSIS — R6889 Other general symptoms and signs: Secondary | ICD-10-CM

## 2017-04-09 DIAGNOSIS — Z9049 Acquired absence of other specified parts of digestive tract: Secondary | ICD-10-CM | POA: Insufficient documentation

## 2017-04-09 DIAGNOSIS — Z7901 Long term (current) use of anticoagulants: Secondary | ICD-10-CM | POA: Insufficient documentation

## 2017-04-09 DIAGNOSIS — Z79890 Hormone replacement therapy (postmenopausal): Secondary | ICD-10-CM | POA: Insufficient documentation

## 2017-04-09 DIAGNOSIS — I38 Endocarditis, valve unspecified: Secondary | ICD-10-CM

## 2017-04-09 DIAGNOSIS — K7460 Unspecified cirrhosis of liver: Secondary | ICD-10-CM

## 2017-04-09 DIAGNOSIS — E1151 Type 2 diabetes mellitus with diabetic peripheral angiopathy without gangrene: Secondary | ICD-10-CM | POA: Insufficient documentation

## 2017-04-09 DIAGNOSIS — Z79899 Other long term (current) drug therapy: Secondary | ICD-10-CM | POA: Insufficient documentation

## 2017-04-09 DIAGNOSIS — C801 Malignant (primary) neoplasm, unspecified: Secondary | ICD-10-CM

## 2017-04-09 DIAGNOSIS — I739 Peripheral vascular disease, unspecified: Secondary | ICD-10-CM

## 2017-04-09 HISTORY — DX: Spontaneous ecchymoses: R23.3

## 2017-04-09 HISTORY — DX: Abnormal result of cardiovascular function study, unspecified: R94.30

## 2017-04-09 HISTORY — DX: Unspecified cirrhosis of liver (CMS HCC): K74.60

## 2017-04-09 HISTORY — DX: Other general symptoms and signs: R68.89

## 2017-04-09 HISTORY — DX: Hypothyroidism, unspecified: E03.9

## 2017-04-09 HISTORY — DX: Long term (current) use of anticoagulants: Z79.01

## 2017-04-09 HISTORY — DX: Other skin changes: R23.8

## 2017-04-09 HISTORY — DX: Palpitations: R00.2

## 2017-04-09 HISTORY — DX: Cardiac murmur, unspecified: R01.1

## 2017-04-09 HISTORY — DX: Peripheral vascular disease, unspecified (CMS HCC): I73.9

## 2017-04-09 HISTORY — DX: Heart failure, unspecified (CMS HCC): I50.9

## 2017-04-09 HISTORY — DX: Personal history of other medical treatment: Z92.89

## 2017-04-09 HISTORY — DX: Liver disease, unspecified: K76.9

## 2017-04-09 HISTORY — DX: Type 2 diabetes mellitus without complications (CMS HCC): E11.9

## 2017-04-09 HISTORY — DX: Malignant (primary) neoplasm, unspecified (CMS HCC): C80.1

## 2017-04-09 HISTORY — DX: Other injury of unspecified body region, initial encounter: T14.8XXA

## 2017-04-09 HISTORY — DX: Endocarditis, valve unspecified: I38

## 2017-04-09 NOTE — Patient Instructions (Signed)
General Surgery Clinic, [facility]  [address]  [place] [address]  [phone]      General Surgery PRE-OP Instruction Sheet    Your surgery will be schedule by the surgical scheduler. Once a surgery date is confirmed the scheduler will notify you of the date.    Prior to surgery, you will need to have Pre-Admission testing.    Please report to the Pre-Admissions Unit on the 4th floor of the Physician Office Center [phone]. During this visit you will speak with anesthesia staff. You may also need lab work, EKG, and chest X-rays    Surgery is scheduled for__________________________    The [facility] staff will contact you between 2:00-6:00 the evening before your surgery with you arrival time and any additional instructions regarding your surgery. The staff will leave a message on your answering machine if you are not available.    You are not to have anything to eat or drink after 12:00 a.m. as directed by anesthesia, also NO tobacco.    Contact the person who prescribed any of the following anticoagulant or antiplatelet medicines (commonly called "blood thinners") to find out when to stop taking before surgery because they may increase your risk of bleeding. The prescriber will work with the surgery office to decide what is best for you.    Anticoagulant Medicine Antiplatelet Medicine    Warfarin (Coumadin, Jantoven)   Eliquis (Apixaban)   Pradaxa (Dabigatran)   Xarelto (Rivaroxaban)   Savaysa (Edoxaban)  Aspirin   Brilinta (Tricagrelor)   Effient (Prasugrel)   Plavix (Clopidogrel)   Ticlid (Ticlopidine)     Stop any of the following anti-inflammatory (NSAID) medicines 5 days prior to surgery:  ibuprofen, advil, motrin, naproxen, aleve, celebrex, mobic and other prescription NSAIDS     Please call the [facility] at [phone] if you need to cancel the morning of your surgery. Please contact us as soon as possible so that modifications can be made. If  you are cancelling in advance, please call the appropriate number below.    On the day of your surgery, report to the 1st floor lobby of [facility] to register. Family members may wait with you until you go into the operating room. They will then be directed to the family waiting rooms until after surgery. Once your family arrives in the waiting area, have them check in with the volunteer. After your surgery, the doctor will come to the waiting area to discuss your condition with your family.     You MUST have a responsible adult to drive you home and stay with you for 24 hours in case there are any post-operative complications.    If you have any questions between now and your surgery date, feel free to call:    Dr. Uzer Khan, Dr. Schaefer, or Dr. Knight: [phone]    Dr. Graves, Dr. Borgstrom, Dr. Bailey, or Dr. LoPinto: [phone]    Dr. Wilson, Dr. Dela'O, or Dr. Grabo : [phone]

## 2017-04-09 NOTE — H&P (Signed)
Subjective:      Chief Complaint   Patient presents with   . Hernia     Abdominal hernia        Kenneth Weeks is a 67 y.o. male who was referred for evaluation of incisional ventral hernia.  This hernia developed a few years ago immediately after his right colectomy which was performed for lower GI bleed and adenocarcinoma of the ascending colon.  Per his report, he has a mild form of Marfan's Syndrome.  He reports that the hernia periodically obstructs causing him to be admitted.  Last admission was [id number].       Past Medical History:   Diagnosis Date   . A-fib (CMS HCC)     Chronic anti-coagulant therapy; Hx cardioversions   . Abdominal hernia     Ventral hernia; Left Inguinal hernia   . Acute on chronic renal failure (CMS HCC)    . Biliary and gallbladder disorder     "Gallstones"   . Blood thinned due to long-term anticoagulant use    . Bruises easily    . Cancer (CMS HCC) 04/09/2017    Colon grade 0 surgery   . Congestive heart failure (CMS HCC) 04/09/2017    CHF 01/2017    . Diabetes mellitus (CMS [place])     type ii diet controlled   . Disorder of liver 04/09/2017    fatty liver   . Dysrhythmias     a fib   . Ejection fraction < 50%    . Heart murmur    . Hemorrhage of gastrointestinal tract 5/16   . Hemorrhagic shock (CMS HCC)    . Hepatic cirrhosis (CMS HCC) 73/53/2992    non alcoholic   . Hx of transfusion 04/09/2017    no reaction   . Hydrocele    . Hypertension 07/10/2014   . Hypothyroid    . Marfan syndrome    . Neck problem 04/09/2017    LROM   . Non-healing non-surgical wound    . Obesity    . Palpitations    . Peripheral vascular disease (CMS [place]) 04/09/2017    no recent problems since starting lasix in 01/2017   . Syncope    . Thyroid disease     hypothyroid   . Transfusion history     s/p Multiple units PRC/FFP this adm.   . Type 2 diabetes mellitus (CMS [place]) 04/09/2017    diet controlled, HA1C 6.0   . Valvular disease 04/09/2017    [facility] aortic valve replacement-chronic anti-coagulant  therapy   . Wears glasses      Past Surgical History:   Procedure Laterality Date   . COLONOSCOPY     . HX COLONOSCOPY     . HX HEART CATHETERIZATION     . [place]    [facility]    . HX HEMICOLECTOMY Right 07/13/2014    Laparoscopic right hemicolectomy for colon cancer   . HX TONSIL AND ADENOIDECTOMY     . HX TONSILLECTOMY     . HX UPPER ENDOSCOPY  07/09/14   . HX WISDOM TEETH EXTRACTION       Family Medical History:     Problem Relation (Age of Onset)    Cirrhosis Brother    Hypertension Mother    Lung Cancer Father        Current Outpatient Medications   Medication Sig Dispense Refill   . cholecalciferol, vitamin D3, 1,000 unit Oral Tablet Take  1,000 Units by mouth Once a day     . docusate sodium (COLACE) 100 mg Oral Capsule Take 1 Cap (100 mg total) by mouth Twice daily  0   . eplerenone (INSPRA) 25 mg Oral Tablet Take 1 Tab (25 mg total) by mouth Once a day 90 Tab 1   . furosemide (LASIX) 40 mg Oral Tablet Take 1 Tab (40 mg total) by mouth Once a day 30 Tab 5   . levothyroxine (SYNTHROID) 200 mcg Oral Tablet TAKE ONE TABLET BY MOUTH ONCE DAILY 90 Tab 3   . lisinopril (PRINIVIL) 5 mg Oral Tablet Take 1 Tab (5 mg total) by mouth Once a day 90 Tab 1   . MAGNESIUM CARBONATE ORAL Take by mouth Once a day     . metoprolol succinate (TOPROL-XL) 100 mg Oral Tablet Sustained Release 24 hr Take 100 mg by mouth Once a day     . vitamin B complex Oral Tablet Take 1 Tab by mouth Once a day     . warfarin (COUMADIN) 5 mg Oral Tablet Take 1.5 Tabs (7.5 mg total) by mouth Once a day (Patient taking differently: Take 7.5 mg by mouth Once a day To be adjusted by following provider) 140 Tab 4     No current facility-administered medications for this visit.      Facility-Administered Medications Ordered in Other Visits   Medication Dose Route Frequency Provider Last Rate Last Dose   . iopamidol (ISOVUE-370) 76% infusion  100 mL Intravenous Give in Radiology Lars Masson, MD           Allergies   Allergen  Reactions   . Allegra [Fexofenadine]  Other Adverse Reaction (Add comment)     "retain fluid"   . Antihistamines - Alkylamine      Pt states antihistamines cause him to retain fluid        Social History     Socioeconomic History   . Marital status: Married     Spouse name: Not on file   . Number of children: Not on file   . Years of education: Not on file   . Highest education level: Not on file   Social Needs   . Financial resource strain: Not on file   . Food insecurity - worry: Not on file   . Food insecurity - inability: Not on file   . Transportation needs - medical: Not on file   . Transportation needs - non-medical: Not on file   Occupational History   . Not on file   Tobacco Use   . Smoking status: Never Smoker   . Smokeless tobacco: Never Used   Substance and Sexual Activity   . Alcohol use: No     Alcohol/week: 0.0 oz   . Drug use: No   . Sexual activity: Not on file   Other Topics Concern   . Ability to Walk 1 Flight of Steps without SOB/CP Yes   . Routine Exercise Not Asked   . Ability to Walk 2 Flight of Steps without SOB/CP Yes   . Unable to Ambulate Not Asked   . Total Care Not Asked   . Ability To Do Own ADL's Yes   . Uses Walker Not Asked   . Other Activity Level Yes     Comment: walks a lot a work, up and down steps several times a day   . Uses Cane Not Asked   Social History Narrative   . Not on file  Review of Systems  Other than ROS in the HPI, all other systems were negative.     Objective:     BP 132/82   Pulse 97   Temp 36.5 C (97.7 F) (Thermal Scan)   Ht 1.905 m (6\' 3" )   Wt 121.6 kg (268 lb 1.3 oz)   SpO2 95% Comment: room air  BMI 33.51 kg/m         General :    alert, cooperative, no distress, appears stated age   Skin   Normal.   Eyes   Conjunctivae/corneas clear. EOM's intact.    Neck:    No asymmetry, masses, or scars   Lungs:    Clear to auscultation bilaterally   Heart:    Regular rate and rhythm   Abdomen/  Rectal:   Soft, non-tender.  Bowel sounds normal.  No masses,  no organomegaly     not performed An incisional hernia is present.  It measures 12cm long x 5cm wide.  Reducible.  + bowel sounds within the hernia.   Neurologic:    negative   Psychiatric:    non focal              CT reviewed.  Ventral hernia with small bowel.  12x5cm.  + inguinal hernia as well.       Assessment:     67yo male with incisional ventral hernia with bowel.  We discussed open vs laparoscopic vs robotic repair.  Given his connective tissue disorder, I feel a more conservative open approach is more likely to be successful and would provide the lowest risk of recurrence.  He will be at higher risk of recurrence than most due to his connective tissue disorder.  Risks of enterotomy, bleeding, infection, seroma, hematoma, recurrence, scar and pain were discussed.  I may need to leave a drain post-operative.  He will likely require admission.      Plan:     1.  Open ventral incisional hernia repair with mesh    Leticia Clas, MD 04/09/2017, 11:43

## 2017-04-09 NOTE — H&P (Signed)
GASTROINTESTINAL SURGERY INITIAL EVALUATION:    Patient: Edris Friedt  D.O.B.: [birth year]  MRN# [id number]  Date of Service: 03/22/2017    Referring Provider: Ardeth Sportsman, MD      Chief Complaint: Abdominal pain    HPI  Henrick Mcgue is a 67 y.o. male with a history of T2N0 adenocarcinoma of the right colon.  He underwent right colectomy by Dr. Catarina Hartshorn in June 2016.  He was following up today with Dr. Landry Corporal, and noted a 2 day history of abdominal pain and non-reducible bulge at the site of an incisional hernia in the midline. He also developed nausea and vomiting over the last 2 days.  He has been unable to tolerate PO aside from a little water.     Denies fevers.  No history of bowel obstruction.     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  He is suspected of having Marfan's.      Allergies:  Allergies   Allergen Reactions   . Allegra [Fexofenadine]  Other Adverse Reaction (Add comment)     "retain fluid"   . Antihistamines - Alkylamine      Pt states antihistamines cause him to retain fluid        Current Outpatient Prescriptions  Outpatient Medications Marked as Taking for the 03/22/17 encounter (Office Visit) with Floreen Comber, MD   Medication Sig   . cholecalciferol, vitamin D3, 1,000 unit Oral Tablet Take 1,000 Units by mouth Once a day   . docusate sodium (COLACE) 100 mg Oral Capsule Take 1 Cap (100 mg total) by mouth Twice daily   . eplerenone (INSPRA) 25 mg Oral Tablet Take 1 Tab (25 mg total) by mouth Once a day   . levothyroxine (SYNTHROID) 200 mcg Oral Tablet TAKE ONE TABLET BY MOUTH ONCE DAILY   . lisinopril (PRINIVIL) 5 mg Oral Tablet Take 1 Tab (5 mg total) by mouth Once a day   . MAGNESIUM CARBONATE ORAL Take by mouth Once a day   . metoprolol succinate (TOPROL-XL) 100 mg Oral Tablet Sustained Release 24 hr Take 100 mg by mouth Once a day   . vitamin B complex Oral Tablet Take 1 Tab by mouth Once a day   . warfarin  (COUMADIN) 5 mg Oral Tablet Take 1.5 Tabs (7.5 mg total) by mouth Once a day (Patient taking differently: Take 7.5 mg by mouth Once a day To be adjusted by following provider)   . [DISCONTINUED] furosemide (LASIX) 40 mg Oral Tablet Take 1 Tab (40 mg total) by mouth Once a day        Past Medical History:    Past Medical History:   Diagnosis Date   . A-fib (CMS HCC)     Chronic anti-coagulant therapy; Hx cardioversions   . Abdominal hernia     Ventral hernia; Left Inguinal hernia   . Acute on chronic renal failure (CMS HCC)    . Biliary and gallbladder disorder     "Gallstones"   . Blood thinned due to long-term anticoagulant use    . Bruises easily    . Cancer (CMS HCC) 04/09/2017    Colon grade 0 surgery   . Congestive heart failure (CMS HCC) 04/09/2017    CHF 01/2017    . Diabetes mellitus (CMS [place])     type ii diet controlled   . Disorder of liver 04/09/2017    fatty liver   . Dysrhythmias  a fib   . Ejection fraction < 50%    . Heart murmur    . Hemorrhage of gastrointestinal tract 5/16   . Hemorrhagic shock (CMS HCC)    . Hepatic cirrhosis (CMS HCC) 51/03/5850    non alcoholic   . Hx of transfusion 04/09/2017    no reaction   . Hydrocele    . Hypertension 07/10/2014   . Hypothyroid    . Marfan syndrome    . Neck problem 04/09/2017    LROM   . Non-healing non-surgical wound    . Obesity    . Palpitations    . Peripheral vascular disease (CMS [place]) 04/09/2017    no recent problems since starting lasix in 01/2017   . Syncope    . Thyroid disease     hypothyroid   . Transfusion history     s/p Multiple units PRC/FFP this adm.   . Type 2 diabetes mellitus (CMS [place]) 04/09/2017    diet controlled, HA1C 6.0   . Valvular disease 04/09/2017    [facility] aortic valve replacement-chronic anti-coagulant therapy   . Wears glasses            Past Surgical History:    Past Surgical History:   Procedure Laterality Date   . COLONOSCOPY     . HX COLONOSCOPY     . HX HEART CATHETERIZATION     . [place]    [facility]    . HX HEMICOLECTOMY Right 07/13/2014    Laparoscopic right hemicolectomy for colon cancer   . HX TONSIL AND ADENOIDECTOMY     . HX TONSILLECTOMY     . HX UPPER ENDOSCOPY  07/09/14   . HX WISDOM TEETH EXTRACTION             Family History:  Family Medical History:     Problem Relation (Age of Onset)    Cirrhosis Brother    Hypertension Mother    Lung Cancer Father              Social History:    Social History     Socioeconomic History   . Marital status: Married     Spouse name: Not on file   . Number of children: Not on file   . Years of education: Not on file   . Highest education level: Not on file   Social Needs   . Financial resource strain: Not on file   . Food insecurity - worry: Not on file   . Food insecurity - inability: Not on file   . Transportation needs - medical: Not on file   . Transportation needs - non-medical: Not on file   Occupational History   . Not on file   Tobacco Use   . Smoking status: Never Smoker   . Smokeless tobacco: Never Used   Substance and Sexual Activity   . Alcohol use: No     Alcohol/week: 0.0 oz   . Drug use: No   . Sexual activity: Not on file   Other Topics Concern   . Ability to Walk 1 Flight of Steps without SOB/CP Yes   . Routine Exercise Not Asked   . Ability to Walk 2 Flight of Steps without SOB/CP Yes   . Unable to Ambulate Not Asked   . Total Care Not Asked   . Ability To Do Own ADL's Yes   . Uses Walker Not Asked   . Other  Activity Level Yes     Comment: walks a lot a work, up and down steps several times a day   . Uses Cane Not Asked   Social History Narrative   . Not on file         ROS:  General: Denies fever, chills, or any changes in weight.  EENT: Denies blurry vision, tinnitus, nasal congestion, or trouble with swallowing.  Cardiovascular: Denies chest pain, pain on exertion, or palpitations.  Respiratory: Denies SOB, cough, or asthma/COPD/emphysema.  Gastrointestinal: see above  GU: Denies dysuria, change in urinary habits, or  hematuria.  Musculoskeletal: Denies joint pain. Denies trouble moving extremities.  Neurological: Denies HA, dizziness, hx of stroke.  Hematologic: Denies hx of bleeding disorders or blood clots.  Endocrine: Denies heat/cold intolerance, flushing, or change in glove/shoe size.  Psychiatric: Denies changes in mood, anxiety, or depression.    Objective:  BP (!) 140/80   Pulse (!) 105   Temp 36.3 C (97.3 F) (Thermal Scan)   Ht 1.905 m (6\' 3" )   Wt 122 kg (268 lb 15.4 oz)   SpO2 97% Comment: room air  BMI 33.62 kg/m         Physical Exam:  Constitutional: Very pleasant, alert, in mild distress  Cardiovascular: RRR; audible click of mechanical valve.  Pulmonary: Lungs CTA bilaterally; No wheezes, rales, or rhonchi observed. Normal respiratory effort. No retractions.  Abdomen: midline incision with palpable firm hernia (x2) directly adjacent to one another.  No skin changes over the hernia.  Some pain with attempt at reduction.  Despite 5 minutes of gentle pressure, I was unable to fully reduce the hernia. NO generalized abdominal pain nor peritonitis.   Extremities: No peripheral edema; No cyanosis or clubbing of nails.  Musculoskeletal: Normal muscle strength and tone of all four extremities.  Skin: No rashes or lesions present; Warm and dry. No jaundice.  Psychiatric: Normal mood and affect; Judgement and thought content normal.    Data:  Labs:   Ref. Range 03/22/2017 13:44   WBC Latest Ref Range: 3.5 - 11.0 x10^3/uL 9.4   HGB Latest Ref Range: 12.5 - 16.3 g/dL 15.2   HCT Latest Ref Range: 36.7 - 47.0 % 45.9   PLATELET COUNT Latest Ref Range: 140 - 450 x10^3/uL 161   RBC Latest Ref Range: 4.06 - 5.63 x10^6/uL 5.05   MCV Latest Ref Range: 78.0 - 100.0 fL 91.0   MCHC Latest Ref Range: 32.5 - 35.8 g/dL 33.0   MCH Latest Ref Range: 27.4 - 33.0 pg 30.1   RDW Latest Ref Range: 12.0 - 15.0 % 16.9 (H)   MPV Latest Ref Range: 7.5 - 11.5 fL 9.2         Imaging studies:  CT abd pelvis  IMPRESSION:  1.  Small bowel  containing ventral abdominal hernias with upstream dilated  loops of small bowel. Findings are concerning for partial or developing  obstruction. Continued radiographic follow-up is recommended.  2.  Stable postoperative changes from right hemicolectomy for colonic  adenocarcinoma.  3.  Decreased mediastinal adenopathy. Mildly prominent periaortic and  inguinal lymph nodes are stable.  4.  Cholelithiasis.        Assessment/Plan:  Odin Mariani is a 67 y.o. male with an incarcerated incisional hernia causing at least a partial small bowel obstruction.  -Will directly admit to hospital.    -NG, NPO, IVF  -Stop coumadin, start heparin drip.  -Will attempt further reduction in hospital.   -NO sign of  bowel necrosis at this time, but if does not respond to conservative measures, will need surgery.    Bed being arranged.  Pt and wife understand and their questions were answered.         Floreen Comber, MD   [facility]

## 2017-04-09 NOTE — Anesthesia Preprocedure Evaluation (Addendum)
ANESTHESIA PRE-OP EVALUATION  Planned Procedure: REPAIR HERNIA INCISIONAL (N/A Abdomen)  BLOCK PRE-OP (N/A )  Review of Systems     anesthesia history negative               Pulmonary     Cardiovascular    Hypertension, valvular problems/murmurs, murmur, dysrhythmias and atrial fibrillation No peripheral edema,  Exercise Tolerance: > or = 4 METS        GI/Hepatic/Renal    liver disease     Endo/Other    hypothyroidism, obesity, chronic transfusion therapy and drug induced coagulopathy,       Neuro/Psych/MS        Cancer                   Physical Assessment      Airway       Mallampati: III    TM distance: >3 FB    Neck ROM: limited  Mouth Opening: good.  No Facial hair  No Beard  No endotracheal tube present  No Tracheostomy present    Dental           (+) missing, caps        Comment: Missing upper left and cap lower left       Pulmonary           Cardiovascular    Comment: valve click  Rhythm: irregular    (+) murmur present   (-) no friction rub and no peripheral edema     Other findings            Plan  Planned anesthesia type: general    ASA 3         Anesthetic plan and risks discussed with patient and spouse.     Anesthesia issues/risks discussed are: Post-op Pain Management, Failure of Block, Sore Throat, Nerve Injuries, PONV, Cardiac Events/MI and Stroke.    Use of blood products discussed with patient whom consented to blood products.     Patient's NPO status is appropriate for Anesthesia.           Plan discussed with resident and attending.                 EKG 01/20/2017 on chart and in media  CXR: Not ordered   CT chest 03/22/2017  CHEST:  LUNGS/PLEURA: No focal consolidation, pleural effusion, or pneumothorax is  identified. Numerous tiny bilateral pulmonary nodules measuring up to 3 mm  are stable from multiple prior studies. No new suspicious pulmonary nodules  are identified. The central airways are patent.    Other Studies: Labs 03/24/2017    01/20/2017 Lexiscan-  Interpretation: No ST changes  diagnostic for ischemia. Perfusion images pending  Nuclear Stress Test 01/20/17    1.The patient had decreased perfusion both at rest and stress in the inferior lateral wall and this was considered a mild area of moderate intensity, consistent with diaphragmatic attenuation artifact. There was no evidence of ischemia noted.   2.The patient had septal akinesis and mild global hypokinesis noted on gated wall motion analysis with an ejection fraction moderately decreased at 36%.  3.The patient was noted to have a dilated left ventricle, both at rest and stress.        12/14/2016 Echo  Conclusions:  1. Mildly dilated left ventricle. Mildly depressed left ventricular systolic function. LV Ejection Fraction is 45  %. Normal geometry. Left ventricular diastolic function could not be assessed due to the presence of  atrial fibrillation  during the study.  2. Resting Segmental Wall Motion Analysis: Total wall motion score is 2.00. There is global left ventricular  hypokinesis.  3. Moderately dilated right ventricle. Mildly depressed right ventricular systolic function. RV systolic  pressure is consistent with at least mild-to-moderate pulmonary hypertension (TR peak gradient 41  mmHg). RA pressure could not be assessed as the IVC is not well visualized.  4. Severely dilated left atrium.  5. Moderately dilated right atrium.  6. There is mild primary mitral valve regurgitation.  7. A mechanical prosthetic valve is present in the aortic position. The aortic valve prosthesis appears well  seated. The aortic prosthesis demonstrates a moderately increased transvalvular gradient for valve type  and size. This is suggestive of moderate prosthetic aortic stenosis.      03/15/2017 Last Cardiology note:    Kenneth Weeks is a 67 y.o. male with    AS:s/p [facility] AVR in 1999. 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  - follow with Echos,will re-echo after HR better controlled for better  AV assessment  -on Coumadin with goal INR 2.5-3.5; last INR 2.37 02/24/17  - not on aspirin given h/o GI bleed    Cardiomyopathy/Diastolic CHF:EF 31-54% on 07/11/14 Echo and now EF 45%, mildly dilated LV, and global LV hypokinesis on 12/14/16 Echo.Nuc. Stress test 01/20/17- No evidence of ischemia noted, septal akinesis and mild global hypokinesis with an EF of 36%, and a dilated LV, bot at rest and stress.  - intolerant to Spironolactone(gynecomastia)  - continue on Toprol-XL; increase to 200 mg daily at next refill (he is to call with bottle info as formulation may have changed per his description)  - continue on Lisinopril  - continue on Eplerenone  - continue Lasix    A-Fib:Hx of DCCV. Echo 12/14/16- Presence of A-Fib, severely dilated LA, and moderately dilated RA.  - no SOB/DOE   - HR today is 98   - CHA2DS2-VASc score of 3 (age + HTN + CMP/CHF)  - after 5 years of A Fib it is not likely that we will be able to successfully rhythm control him  - continue Coumadin for stroke prophylaxis  - continue Toprol for rate control; after he finishes current bottle will increase to 200 mg for better HR control as above    Pulmonary MGQ:[id number] dilated RV and mild-to-moderate pulmonary HTN on 12/14/16 Echo.  - c/o SOB/DOE    IZT:IWPY controlled   -   Lipids:FLP 07/10/14- Total 74, Trig. 115, HDL 18, LDL 33  - not on statin therapy  - encouraged heart healthy diet and exercise    ?KDX:[id number] snoring and apneic events  - consider future sleep study    Nonsmoker    Hx ofMarfan's Syndrome(?):  - unclear how diagnosed, he does not have typical features, no h/o ectopia lentis, aortic root normal in size on recent echo    Follow up in 3 months    Thank you for allowing me to participate in the care of your patient. Please feel free to contact me if there are further questions.     I am scribing for, and in the presence of Dr. Barnet Glasgow, MDfor servicesprovided on 03/15/2017.  Debbora Dus, [place], [place]  03/15/2017, 16:20    I personally performed the services described in this documentation, as scribedin my presence, and it is both accurateand complete.    Barnet Glasgow, MD    Shanon Brow  Harlan Stains, MD  03/15/2017, 16:29          Consults: In basket sent to [employer]. Plitt, MD Cardiologist  Response:    Plitt, Monia Sabal, MD to Jacklynn Barnacle, APRN, FNP-BC"    "He will have to be bridged,with Lovenox (outpatient) or IV heparin (inpatient)   given mechanical heart valve. Otherwise no further cardiac testing needed"       Patient instructed to take the following medications day of surgery, synthroid.  Patient takes lisinopril in the evening.  Patient instructed to contact PCP regarding holding coumadin.  Patient instructed to avoid NSAIDs 7 days prior to procedure.  Patient provided anesthesia consent in PEC. Instructed to review consent prior to OR date. Educated that consent will be signed morning of surgery with anesthesiologist.

## 2017-04-12 ENCOUNTER — Telehealth (INDEPENDENT_AMBULATORY_CARE_PROVIDER_SITE_OTHER): Payer: Self-pay | Admitting: INTERNAL MEDICINE

## 2017-04-12 NOTE — Telephone Encounter (Signed)
He is scheduled for Hernia  surgery in [facility] with Dr  Mel Almond, on 04/21/17   He needs to know how far in advance to stop in coumadin  They will be sending a fax on that    [phone]

## 2017-04-13 ENCOUNTER — Encounter (HOSPITAL_BASED_OUTPATIENT_CLINIC_OR_DEPARTMENT_OTHER): Payer: Self-pay | Admitting: Student in an Organized Health Care Education/Training Program

## 2017-04-13 NOTE — Telephone Encounter (Signed)
He is going to need to do the Lovenox shots for at least 3 days leading up to the surgery.     Is he wiling to do that? If so, I can prescribe them for him.

## 2017-04-13 NOTE — Telephone Encounter (Signed)
Patient called back, please advise him as to what to do

## 2017-04-14 ENCOUNTER — Telehealth (INDEPENDENT_AMBULATORY_CARE_PROVIDER_SITE_OTHER): Payer: Self-pay | Admitting: Surgery

## 2017-04-14 NOTE — Telephone Encounter (Signed)
Spoke with pt on the phone. He has discussed transitioning to Lovenox 3 days prior to sx with Internal Medicine. Instructed him to call them back to discuss the actual Lovenox self injection. He states that he will call them, but that he does know how to inject insulin. Notes in Epic from Internal Medicine confirming the change to Lovenox.     Chelsea Aus, RN  Department of General Surgery

## 2017-04-14 NOTE — Telephone Encounter (Signed)
-----   Message from [facility] sent at 04/14/2017  9:37 AM EST -----  Regarding: preop question  Patient called and has questions about his blood thinner prior to surgery. Please call him at [phone]

## 2017-04-14 NOTE — Telephone Encounter (Signed)
Pt calling on the status of the Lovenox Rx. Pt will p/u when ready.  Gregary Cromer, MA  04/14/2017, 14:24

## 2017-04-14 NOTE — Telephone Encounter (Signed)
Patient is requesting a written script for the Lovenox

## 2017-04-15 MED ORDER — ENOXAPARIN 120 MG/0.8 ML SUBCUTANEOUS SYRINGE: 120 mg | Syringe | Freq: Two times a day (BID) | SUBCUTANEOUS | 2 refills | 0 days | Status: DC

## 2017-04-15 NOTE — Telephone Encounter (Signed)
Pt requests Rx to be sent to [facility]. Pt states that it is cheaper at [facility]. I canceled the E-Rx at [facility] and called in the Rx to [facility].  Gregary Cromer, MA  04/15/2017, 14:52

## 2017-04-15 NOTE — Telephone Encounter (Signed)
Pt calling again, he needs his Lovenox before the weekend. He would like the generic brand please.  Gregary Cromer, MA  04/15/2017, 11:06

## 2017-04-16 ENCOUNTER — Ambulatory Visit (INDEPENDENT_AMBULATORY_CARE_PROVIDER_SITE_OTHER): Payer: Self-pay

## 2017-04-19 ENCOUNTER — Other Ambulatory Visit (INDEPENDENT_AMBULATORY_CARE_PROVIDER_SITE_OTHER): Payer: Self-pay | Admitting: NURSE PRACTITIONER

## 2017-04-20 MED ORDER — DEXTROSE 5 % IN WATER (D5W) INTRAVENOUS SOLUTION
3.0000 g | Freq: Once | INTRAVENOUS | Status: AC
Start: 2017-04-21 — End: 2017-04-21
  Administered 2017-04-21: 3 g via INTRAVENOUS
  Filled 2017-04-20: qty 30

## 2017-04-21 ENCOUNTER — Inpatient Hospital Stay (HOSPITAL_COMMUNITY): Payer: Medicare Other | Admitting: Internal Medicine

## 2017-04-21 ENCOUNTER — Inpatient Hospital Stay (HOSPITAL_BASED_OUTPATIENT_CLINIC_OR_DEPARTMENT_OTHER): Payer: Medicare Other | Admitting: ANESTHESIOLOGY

## 2017-04-21 ENCOUNTER — Inpatient Hospital Stay (HOSPITAL_COMMUNITY): Payer: Medicare Other

## 2017-04-21 ENCOUNTER — Inpatient Hospital Stay (HOSPITAL_BASED_OUTPATIENT_CLINIC_OR_DEPARTMENT_OTHER): Payer: Medicare Other | Admitting: Internal Medicine

## 2017-04-21 ENCOUNTER — Encounter (HOSPITAL_COMMUNITY): Payer: Self-pay

## 2017-04-21 ENCOUNTER — Other Ambulatory Visit (HOSPITAL_COMMUNITY): Payer: Self-pay | Admitting: Surgery

## 2017-04-21 ENCOUNTER — Encounter (HOSPITAL_COMMUNITY)
Admission: RE | Disposition: A | Discharge status: Home Health | Payer: Self-pay | Source: Ambulatory Visit | Attending: Surgery

## 2017-04-21 ENCOUNTER — Inpatient Hospital Stay (HOSPITAL_COMMUNITY): Payer: Medicare Other | Admitting: Family

## 2017-04-21 ENCOUNTER — Inpatient Hospital Stay
Admission: RE | Admit: 2017-04-21 | Discharge: 2017-04-30 | DRG: 354 | Disposition: A | Discharge status: Home Health | Payer: Medicare Other | Source: Ambulatory Visit | Attending: Surgery | Admitting: Surgery

## 2017-04-21 ENCOUNTER — Inpatient Hospital Stay (HOSPITAL_COMMUNITY): Payer: Medicare Other | Admitting: Surgery

## 2017-04-21 DIAGNOSIS — I509 Heart failure, unspecified: Secondary | ICD-10-CM | POA: Diagnosis present

## 2017-04-21 DIAGNOSIS — L7622 Postprocedural hemorrhage and hematoma of skin and subcutaneous tissue following other procedure: Secondary | ICD-10-CM | POA: Diagnosis not present

## 2017-04-21 DIAGNOSIS — Z9109 Other allergy status, other than to drugs and biological substances: Secondary | ICD-10-CM

## 2017-04-21 DIAGNOSIS — K439 Ventral hernia without obstruction or gangrene: Secondary | ICD-10-CM

## 2017-04-21 DIAGNOSIS — Z85038 Personal history of other malignant neoplasm of large intestine: Secondary | ICD-10-CM

## 2017-04-21 DIAGNOSIS — K760 Fatty (change of) liver, not elsewhere classified: Secondary | ICD-10-CM | POA: Diagnosis present

## 2017-04-21 DIAGNOSIS — E039 Hypothyroidism, unspecified: Secondary | ICD-10-CM | POA: Diagnosis present

## 2017-04-21 DIAGNOSIS — I130 Hypertensive heart and chronic kidney disease with heart failure and stage 1 through stage 4 chronic kidney disease, or unspecified chronic kidney disease: Secondary | ICD-10-CM | POA: Diagnosis present

## 2017-04-21 DIAGNOSIS — S301XXA Contusion of abdominal wall, initial encounter: Secondary | ICD-10-CM | POA: Diagnosis not present

## 2017-04-21 DIAGNOSIS — K7469 Other cirrhosis of liver: Secondary | ICD-10-CM | POA: Diagnosis present

## 2017-04-21 DIAGNOSIS — E669 Obesity, unspecified: Secondary | ICD-10-CM | POA: Diagnosis present

## 2017-04-21 DIAGNOSIS — G8918 Other acute postprocedural pain: Secondary | ICD-10-CM

## 2017-04-21 DIAGNOSIS — I959 Hypotension, unspecified: Secondary | ICD-10-CM | POA: Diagnosis not present

## 2017-04-21 DIAGNOSIS — N189 Chronic kidney disease, unspecified: Secondary | ICD-10-CM | POA: Diagnosis present

## 2017-04-21 DIAGNOSIS — R109 Unspecified abdominal pain: Secondary | ICD-10-CM

## 2017-04-21 DIAGNOSIS — E1122 Type 2 diabetes mellitus with diabetic chronic kidney disease: Secondary | ICD-10-CM | POA: Diagnosis present

## 2017-04-21 DIAGNOSIS — K469 Unspecified abdominal hernia without obstruction or gangrene: Secondary | ICD-10-CM

## 2017-04-21 DIAGNOSIS — Z5181 Encounter for therapeutic drug level monitoring: Secondary | ICD-10-CM

## 2017-04-21 DIAGNOSIS — K432 Incisional hernia without obstruction or gangrene: Principal | ICD-10-CM

## 2017-04-21 DIAGNOSIS — N179 Acute kidney failure, unspecified: Secondary | ICD-10-CM | POA: Diagnosis not present

## 2017-04-21 DIAGNOSIS — K7460 Unspecified cirrhosis of liver: Secondary | ICD-10-CM | POA: Diagnosis present

## 2017-04-21 DIAGNOSIS — Z79899 Other long term (current) drug therapy: Secondary | ICD-10-CM

## 2017-04-21 DIAGNOSIS — I4891 Unspecified atrial fibrillation: Secondary | ICD-10-CM | POA: Diagnosis present

## 2017-04-21 DIAGNOSIS — I739 Peripheral vascular disease, unspecified: Secondary | ICD-10-CM | POA: Diagnosis present

## 2017-04-21 DIAGNOSIS — Z6833 Body mass index (BMI) 33.0-33.9, adult: Secondary | ICD-10-CM

## 2017-04-21 DIAGNOSIS — Z79890 Hormone replacement therapy (postmenopausal): Secondary | ICD-10-CM

## 2017-04-21 DIAGNOSIS — Z952 Presence of prosthetic heart valve: Secondary | ICD-10-CM

## 2017-04-21 DIAGNOSIS — E1151 Type 2 diabetes mellitus with diabetic peripheral angiopathy without gangrene: Secondary | ICD-10-CM | POA: Diagnosis present

## 2017-04-21 DIAGNOSIS — Z7901 Long term (current) use of anticoagulants: Secondary | ICD-10-CM

## 2017-04-21 DIAGNOSIS — N5089 Other specified disorders of the male genital organs: Secondary | ICD-10-CM | POA: Diagnosis present

## 2017-04-21 DIAGNOSIS — K660 Peritoneal adhesions (postprocedural) (postinfection): Secondary | ICD-10-CM | POA: Diagnosis present

## 2017-04-21 DIAGNOSIS — Y838 Other surgical procedures as the cause of abnormal reaction of the patient, or of later complication, without mention of misadventure at the time of the procedure: Secondary | ICD-10-CM | POA: Diagnosis not present

## 2017-04-21 DIAGNOSIS — Q8740 Marfan's syndrome, unspecified: Secondary | ICD-10-CM

## 2017-04-21 LAB — PT/INR
INR: 1.42 — ABNORMAL HIGH (ref 0.80–1.20)
INR: 1.42 — ABNORMAL HIGH (ref 0.80–1.20)
PROTHROMBIN TIME: 16.6 seconds — ABNORMAL HIGH (ref 9.5–14.1)

## 2017-04-21 LAB — POC BLOOD GLUCOSE (RESULTS): GLUCOSE, POC: 109 mg/dl — ABNORMAL HIGH (ref 70–105)

## 2017-04-21 SURGERY — REPAIR HERNIA INCISIONAL
Anesthesia: General | Site: Abdomen | Wound class: Clean Wound: Uninfected operative wounds in which no inflammation occurred

## 2017-04-21 MED ORDER — PROPOFOL 10 MG/ML IV BOLUS
INJECTION | Freq: Once | INTRAVENOUS | Status: DC | PRN
Start: 2017-04-21 — End: 2017-04-21
  Administered 2017-04-21: 150 mg via INTRAVENOUS

## 2017-04-21 MED ORDER — ROCURONIUM 10 MG/ML INTRAVENOUS SOLUTION
Freq: Once | INTRAVENOUS | Status: DC | PRN
Start: 2017-04-21 — End: 2017-04-21
  Administered 2017-04-21 (×6): 10 mg via INTRAVENOUS
  Administered 2017-04-21: 50 mg via INTRAVENOUS

## 2017-04-21 MED ORDER — SODIUM CHLORIDE 0.9 % (FLUSH) INJECTION SYRINGE
2.0000 mL | INJECTION | INTRAMUSCULAR | Status: DC | PRN
Start: 2017-04-21 — End: 2017-04-21

## 2017-04-21 MED ORDER — DEXAMETHASONE SODIUM PHOSPHATE 4 MG/ML INJECTION SOLUTION
Freq: Once | INTRAMUSCULAR | Status: DC | PRN
Start: 2017-04-21 — End: 2017-04-21
  Administered 2017-04-21: 4 mg via INTRAVENOUS

## 2017-04-21 MED ORDER — LACTATED RINGERS INTRAVENOUS SOLUTION
INTRAVENOUS | Status: DC
Start: 2017-04-21 — End: 2017-04-21

## 2017-04-21 MED ORDER — SODIUM CHLORIDE 0.9 % (FLUSH) INJECTION SYRINGE
2.0000 mL | INJECTION | Freq: Three times a day (TID) | INTRAMUSCULAR | Status: DC
Start: 2017-04-21 — End: 2017-04-30
  Administered 2017-04-21: 2 mL
  Administered 2017-04-21: 0 mL
  Administered 2017-04-22 – 2017-04-23 (×5): 2 mL
  Administered 2017-04-23 – 2017-04-24 (×2): 0 mL
  Administered 2017-04-24 – 2017-04-30 (×18): 2 mL

## 2017-04-21 MED ORDER — SODIUM CHLORIDE 0.9 % (FLUSH) INJECTION SYRINGE
2.00 mL | INJECTION | Freq: Three times a day (TID) | INTRAMUSCULAR | Status: DC
Start: 2017-04-21 — End: 2017-04-22
  Administered 2017-04-21: 2 mL
  Administered 2017-04-21 – 2017-04-22 (×2): 0 mL

## 2017-04-21 MED ORDER — ACETAMINOPHEN 325 MG TABLET
650.0000 mg | ORAL_TABLET | Freq: Four times a day (QID) | ORAL | Status: DC
Start: 2017-04-21 — End: 2017-04-26
  Administered 2017-04-21: 650 mg via ORAL
  Administered 2017-04-21 – 2017-04-22 (×2): 0 mg via ORAL
  Administered 2017-04-22 – 2017-04-23 (×7): 650 mg via ORAL
  Administered 2017-04-23: 0 mg via ORAL
  Administered 2017-04-23 – 2017-04-25 (×6): 650 mg via ORAL
  Administered 2017-04-25: 0 mg via ORAL
  Administered 2017-04-25 – 2017-04-26 (×3): 650 mg via ORAL
  Filled 2017-04-21 (×16): qty 2

## 2017-04-21 MED ORDER — SODIUM CHLORIDE 0.9 % (FLUSH) INJECTION SYRINGE
2.00 mL | INJECTION | INTRAMUSCULAR | Status: DC | PRN
Start: 2017-04-21 — End: 2017-04-22

## 2017-04-21 MED ORDER — FENTANYL (PF) 50 MCG/ML INJECTION SOLUTION
Freq: Once | INTRAMUSCULAR | Status: DC | PRN
Start: 2017-04-21 — End: 2017-04-21
  Administered 2017-04-21 (×2): 50 ug via INTRAVENOUS

## 2017-04-21 MED ORDER — ONDANSETRON HCL (PF) 4 MG/2 ML INJECTION SOLUTION
Freq: Once | INTRAMUSCULAR | Status: DC | PRN
Start: 2017-04-21 — End: 2017-04-21
  Administered 2017-04-21 (×2): 4 mg via INTRAVENOUS

## 2017-04-21 MED ORDER — BUPIVACAINE (PF) 0.5 % (5 MG/ML) INJECTION SOLUTION
20.0000 mL | Freq: Once | INTRAMUSCULAR | Status: DC | PRN
Start: 2017-04-21 — End: 2017-04-21
  Administered 2017-04-21: 40 mL via INTRAMUSCULAR

## 2017-04-21 MED ORDER — HYDROMORPHONE 2 MG/ML INJECTION SYRINGE
0.2000 mg | INJECTION | INTRAMUSCULAR | Status: DC | PRN
Start: 2017-04-21 — End: 2017-04-25
  Administered 2017-04-25 (×2): 0.2 mg via INTRAVENOUS
  Filled 2017-04-21 (×2): qty 1

## 2017-04-21 MED ORDER — SODIUM CHLORIDE 0.9 % IRRIGATION SOLUTION
1000.0000 mL | Status: DC | PRN
Start: 2017-04-21 — End: 2017-04-21
  Administered 2017-04-21 (×2): 1000 mL

## 2017-04-21 MED ORDER — EPLERENONE 25 MG TABLET
25.0000 mg | ORAL_TABLET | Freq: Every day | ORAL | Status: DC
Start: 2017-04-21 — End: 2017-04-30
  Administered 2017-04-21 – 2017-04-30 (×10): 25 mg via ORAL
  Filled 2017-04-21 (×11): qty 1

## 2017-04-21 MED ORDER — SODIUM CHLORIDE 0.9 % (FLUSH) INJECTION SYRINGE
20.0000 mL | INJECTION | Freq: Once | INTRAMUSCULAR | Status: DC | PRN
Start: 2017-04-21 — End: 2017-04-21

## 2017-04-21 MED ORDER — FUROSEMIDE 40 MG TABLET
40.0000 mg | ORAL_TABLET | Freq: Every day | ORAL | Status: DC
Start: 2017-04-21 — End: 2017-04-29
  Administered 2017-04-21 – 2017-04-28 (×8): 40 mg via ORAL
  Filled 2017-04-21 (×10): qty 1

## 2017-04-21 MED ORDER — FENTANYL (PF) 50 MCG/ML INJECTION SOLUTION
100.0000 ug | Freq: Once | INTRAMUSCULAR | Status: DC | PRN
Start: 2017-04-21 — End: 2017-04-21
  Administered 2017-04-21 (×2): 100 ug via INTRAVENOUS
  Filled 2017-04-21 (×2): qty 2

## 2017-04-21 MED ORDER — SODIUM CHLORIDE 0.9 % (FLUSH) INJECTION SYRINGE
2.0000 mL | INJECTION | Freq: Three times a day (TID) | INTRAMUSCULAR | Status: DC
Start: 2017-04-21 — End: 2017-04-21
  Administered 2017-04-21 (×2): 0 mL

## 2017-04-21 MED ORDER — DOCUSATE SODIUM 100 MG CAPSULE
100.0000 mg | ORAL_CAPSULE | Freq: Two times a day (BID) | ORAL | Status: DC
Start: 2017-04-21 — End: 2017-04-21

## 2017-04-21 MED ORDER — GLYCOPYRROLATE 0.2 MG/ML INJECTION SOLUTION
Freq: Once | INTRAMUSCULAR | Status: DC | PRN
Start: 2017-04-21 — End: 2017-04-21
  Administered 2017-04-21: .6 mg via INTRAVENOUS

## 2017-04-21 MED ORDER — SODIUM CHLORIDE 0.9 % (FLUSH) INJECTION SYRINGE
2.0000 mL | INJECTION | INTRAMUSCULAR | Status: DC | PRN
Start: 2017-04-21 — End: 2017-04-30

## 2017-04-21 MED ORDER — SENNOSIDES 8.6 MG-DOCUSATE SODIUM 50 MG TABLET
1.0000 | ORAL_TABLET | Freq: Every evening | ORAL | Status: DC
Start: 2017-04-21 — End: 2017-04-30
  Administered 2017-04-21 – 2017-04-29 (×9): 1 via ORAL
  Filled 2017-04-21 (×10): qty 1

## 2017-04-21 MED ORDER — METHOCARBAMOL 500 MG TABLET
500.00 mg | ORAL_TABLET | Freq: Four times a day (QID) | ORAL | Status: DC | PRN
Start: 2017-04-21 — End: 2017-04-30
  Filled 2017-04-21: qty 1

## 2017-04-21 MED ORDER — MIDAZOLAM 1 MG/ML INJECTION SOLUTION
2.0000 mg | Freq: Once | INTRAMUSCULAR | Status: DC | PRN
Start: 2017-04-21 — End: 2017-04-21
  Administered 2017-04-21: 2 mg via INTRAVENOUS
  Filled 2017-04-21 (×2): qty 2

## 2017-04-21 MED ORDER — FENTANYL (PF) 50 MCG/ML INJECTION SOLUTION
12.5000 ug | INTRAMUSCULAR | Status: DC | PRN
Start: 2017-04-21 — End: 2017-04-21

## 2017-04-21 MED ORDER — WARFARIN 7.5 MG TABLET
7.5000 mg | ORAL_TABLET | Freq: Every day | ORAL | Status: DC
Start: 2017-04-21 — End: 2017-04-23
  Administered 2017-04-21 – 2017-04-22 (×2): 7.5 mg via ORAL
  Filled 2017-04-21 (×4): qty 1

## 2017-04-21 MED ORDER — OXYCODONE 5 MG TABLET
5.0000 mg | ORAL_TABLET | ORAL | Status: DC | PRN
Start: 2017-04-21 — End: 2017-04-30
  Administered 2017-04-21 – 2017-04-30 (×16): 5 mg via ORAL
  Filled 2017-04-21 (×17): qty 1

## 2017-04-21 MED ORDER — LIDOCAINE (PF) 100 MG/5 ML (2 %) INTRAVENOUS SYRINGE
INJECTION | Freq: Once | INTRAVENOUS | Status: DC | PRN
Start: 2017-04-21 — End: 2017-04-21
  Administered 2017-04-21: 50 mg via INTRAVENOUS

## 2017-04-21 MED ORDER — FENTANYL (PF) 50 MCG/ML INJECTION SOLUTION
25.0000 ug | INTRAMUSCULAR | Status: DC | PRN
Start: 2017-04-21 — End: 2017-04-21

## 2017-04-21 MED ORDER — DOCUSATE SODIUM 100 MG CAPSULE
100.00 mg | ORAL_CAPSULE | Freq: Two times a day (BID) | ORAL | Status: DC
Start: 2017-04-21 — End: 2017-04-30
  Administered 2017-04-21 – 2017-04-30 (×18): 100 mg via ORAL
  Filled 2017-04-21 (×17): qty 1

## 2017-04-21 MED ORDER — NEOSTIGMINE METHYLSULFATE 1 MG/ML INTRAVENOUS SOLUTION
Freq: Once | INTRAVENOUS | Status: DC | PRN
Start: 2017-04-21 — End: 2017-04-21
  Administered 2017-04-21 (×2): 5 mg via INTRAVENOUS

## 2017-04-21 MED ORDER — HYDROMORPHONE 2 MG/ML INJECTION SYRINGE
INJECTION | Freq: Once | INTRAMUSCULAR | Status: DC | PRN
Start: 2017-04-21 — End: 2017-04-21
  Administered 2017-04-21: 0.5 mg via INTRAVENOUS

## 2017-04-21 MED ORDER — LEVOTHYROXINE 200 MCG TABLET
200.00 ug | ORAL_TABLET | Freq: Every morning | ORAL | Status: DC
Start: 2017-04-22 — End: 2017-04-30
  Administered 2017-04-22: 200 ug via ORAL
  Administered 2017-04-23: 0 ug via ORAL
  Administered 2017-04-24 – 2017-04-30 (×7): 200 ug via ORAL
  Filled 2017-04-21 (×10): qty 1

## 2017-04-21 MED ORDER — METOPROLOL SUCCINATE ER 100 MG TABLET,EXTENDED RELEASE 24 HR
100.0000 mg | ORAL_TABLET | Freq: Every day | ORAL | Status: DC
Start: 2017-04-21 — End: 2017-04-30
  Administered 2017-04-21 – 2017-04-30 (×9): 100 mg via ORAL
  Filled 2017-04-21 (×11): qty 1

## 2017-04-21 MED ORDER — ENOXAPARIN 120 MG/0.8 ML SUBCUTANEOUS SYRINGE
120.00 mg | INJECTION | Freq: Two times a day (BID) | SUBCUTANEOUS | Status: DC
Start: 2017-04-21 — End: 2017-04-23
  Administered 2017-04-21 – 2017-04-23 (×4): 120 mg via SUBCUTANEOUS
  Filled 2017-04-21 (×9): qty 0.8

## 2017-04-21 MED ADMIN — lactated Ringers intravenous solution: @ 11:00:00

## 2017-04-21 MED ADMIN — nystatin 100,000 unit/gram topical powder: INTRAVENOUS | @ 15:00:00 | NDC 00574200815

## 2017-04-21 SURGICAL SUPPLY — 22 items
APPL 70% ISPRP 2% CHG 26ML CHLRPRP HI-LT ORNG PREP STRL LF  DISP CLR (WOUND CARE SUPPLY) ×2 IMPLANT
BLADE 11 BD RB-BCK CBNSTL SURG TISS STRL LF  DISP (SURGICAL CUTTING SUPPLIES) ×2 IMPLANT
CONV USE ITEM 156524 - ADHESIVE TISSUE EXOFIN 1.0ML_PREMIERPRO EXOFIN (SEALANTS) ×2 IMPLANT
CONV USE ITEM 337890 - PACK SURG BSIN 2 STRL LF  DISP (CUSTOM TRAYS & PACK) ×2 IMPLANT
CONV USE ITEM 338653 - PACK SURG ABDOMINAL NONST DISP LF (CUSTOM TRAYS & PACK) ×2 IMPLANT
COVER CAM DISP (DRAPE/PACKS/SHEETS/OR TOWEL) IMPLANT
COVER WND RF DETECT STRL CLR EQP (EQUIPMENT MINOR) ×2 IMPLANT
DISCONTINUED USE ITEM 102436 - NEEDLE HYPO 22GA 1.5IN REG WL_BD POLYPROP REG BVL LL HUB (NEEDLES & SYRINGE SUPPLIES) ×2 IMPLANT
DRAIN INCS .25IN 12IN PNRS RUB SAF PIN RADOPQ STRL LTX STD DISP 4067 (Drains/Resovoirs) IMPLANT
DUPE USE ITEM 319388 - SUTURE SILK 0 PERMAHAND 24IN B_LK BRD TIE NONAB (SUTURE/WOUND CLOSURE) IMPLANT
ELECTRODE ESURG BLADE 2.75IN 3/32IN EDGE STRL .2IN DISP INSL STD SHAFT HEX LOCK LF (CAUTERY SUPPLIES) ×2 IMPLANT
MESH SURG PROLENE 6X6IN KNIT NONAB 2 DRCT ELAS STRL POLYPROP FLAT SQ LF  HERNIA REPR (Mesh) ×2 IMPLANT
NEEDLE SPINAL BLK 3.5IN 22GA QUINCKE REG WL POLYPROP QUINCKE TIP STRL LF  DISP (ANETHESIA SUPPLIES) IMPLANT
NEG PRESS PREVENA PEEL & PLC PTCH STRP VAC 13CM THERAPY UNIT CONN CAN CRRY CA SYSTEM 45ML (WOUND CARE SUPPLY) ×2 IMPLANT
PACK SURG ABDOMINAL NONST DISP LF (CUSTOM TRAYS & PACK) ×2
PAD ARMBRD BLU (POSITIONING PRODUCTS) ×4 IMPLANT
POSITION POSITION 9IN DONUT HEAD FOAM (SUPP) ×2 IMPLANT
SPONGE LAP 18X18 RFD STRL_L181804P01C1 40PK/CS (WOUND CARE/ENTEROSTOMAL SUPPLY) ×2
SPONGE LAP 18X18IN STD 4 PLY XRY RF DTBL ABS RFDETECT COTTON STRL LF  DISP (WOUND CARE SUPPLY) ×4 IMPLANT
SPONGE LAP 9/16X.25IN DSCT RADOPQ FBR STRL (WOUND CARE SUPPLY) ×4 IMPLANT
SUTURE 3-0 POLYSRB 30IN VIOL BRD TIE 6 STRN PCUT ABS (SUTURE/WOUND CLOSURE) IMPLANT
SUTURE SILK 0 PERMAHAND 24IN B_LK BRD TIE NONAB (SUTURE/WOUND CLOSURE)

## 2017-04-21 NOTE — Nurses Notes (Signed)
Patient arrived from ED in bed on 2 L NC. Full initial assessment and vitals per flow sheet. Pt denies pain at this time.

## 2017-04-21 NOTE — OR PostOp (Signed)
Patient developed some abdominal pain 7/10. See oxy tablet given. Wife at bedside. Patient in good affect. Engages conversation.

## 2017-04-21 NOTE — Nurses Notes (Signed)
Patient arrived to pacu in no apparent distress. VSS. No pain or nausea noted. Will continue to monitor.

## 2017-04-21 NOTE — Brief Op Note (Signed)
[facility]                                       BRIEF OPERATIVE NOTE    Patient Name: Kenneth Weeks, Kenneth Weeks [facility] Number: [id number]  Date of Service: 04/21/2017   Date of Birth: 09-Aug-1950    Pre-Operative Diagnosis:   1. Incisional ventral hernia  2. Marfan's syndrome   Post-Operative Diagnosis:  1. Incisional ventral hernia  2. Marfan's syndrome   Procedure(s)/Description:    1. Ventral hernia repair with onlay Prolene mesh  2. Lysis of adhesions   Findings/Complexity (inherent to the procedure performed): Transverse colon heavily scarred to the hernia sac and carefully taken down sharply. No serosal injury to the bowel as a result. Hernia sac excised. Defect closed transversely with Prolene mesh onlay. Provena placed on wound     Attending Surgeon:  Dr. Mel Almond   Assistant(s):  Dr. Tyrone Schimke, PGY V     Anesthesia Type: General  Estimated Blood Loss:  50 cc   Blood Given: None  Fluids Given: 1000 cc crystalloid   Complications (not routinely expected or not inherent to difficulty/nature of procedure): No immediate complications   Characteristic Event (routinely expected or inherent to the difficulty/nature of the procedure): See dictated report   Did the use of current and/or prior Anticoagulants impact the outcome of the case? no  Wound Class: Clean Wound: Uninfected operative wounds in which no inflammation occurred    Tubes: None  Drains: None  Specimens/ Cultures: Hernia sac  Implants: Prolene mesh            Disposition: PACU - hemodynamically stable.  Condition: stable    Para Skeans, MD    I was present, scrubbed for, and participated in all portions of the case.    Avelina Laine, MD

## 2017-04-21 NOTE — Anesthesia Procedure Notes (Addendum)
Block: Truncal block    Sedation  The patient was continuously monitored throughout the procedure and in recovery. I was in attendance and supervised the sedation (during the start and stop times listed below) and remained immediately available until the patient returned to pre-procedure baseline.  Lanetta Inch II, MD 04/22/2017, 07:35  Sedation Start Time  04/21/2017 11:55 AM   Sedation Stop Time 04/21/2017 12:16 PM  Block Type: TAP   Diagnosis: abdominal pain   Indication: Requested by surgeon and Acute post operative pain  Laterality: Bilateral  Indication: post-op pain management  Requesting Surgeon: Leticia Clas, MD  Site verified, H&P updated and consent obtained, Patient monitors applied, Timeout performed, Emergency drugs and equipment available, Patient positioned and anesthesia consent given  Technique(See MAR for doses)  Type of Block: Single shot           Ultrasound used for needle placement, imaging, supervision, and interpretation. Ultrasound image archived    Sterile Skin Prep : hand hygiene performed, sterile technique, cap, mask, sterile gloves, kit assembled on sterile field, sterile field established and sterilely prepped and draped    Skin prepped with: Chlorhexidine gluconate and isopropyl alcohol    Skin Local  Local amount 1 mL.of lidocaine 1%   Medications    Needle  Needle type: Tuohy     Needle Gauge: 22G   Needle length: 4 in.  Number of attempts: 2      Catheter          Site  Initial injection:T10 and Additional injection:T10  Assessment       Events  Inserted easily and No Complications   Patient tolerance of procedure: tolerated well, no immediate complications        Performed By:  Authorizing Provider:  Rupert Stacks, MD  Performing Provider:  Hazle Quant, MD  I was present and supervised/observed the entire procedure.  Lanetta Inch II, MD 04/22/2017, 07:35Pt location: at bedside

## 2017-04-21 NOTE — H&P (Signed)
[facility]      H&P UPDATE FORM                                                                                  Kenneth Weeks, Kenneth Weeks, 67 y.o. male  Date of Admission:  04/21/2017  Date of Birth:  Jan 31, 1951    04/21/2017    STOP: IF H&P IS GREATER THAN 30 DAYS FROM SURGICAL DAY COMPLETE NEW H&P IS REQUIRED.     H & P updated the day of the procedure.  1.  H&P completed within 30 days of surgical procedure  and has been reviewed within 24 hours of the surgery, the patient has been examined, and no change has occured in the patients condition since the H&P was completed. 04/09/2017      Change in medications: No  Bridged off anticoagulants onto Lovenox     No LMP for male patient.      Comments:     2.  Patient continues to be appropriate candidate for planned surgical procedure. YES    Leticia Clas, MD

## 2017-04-21 NOTE — Anesthesia Postprocedure Evaluation (Signed)
Anesthesia Post Op Evaluation    Patient: Kenneth Weeks  Procedure(s) with comments:  REPAIR HERNIA INCISIONAL - Incisional wound vac applied, Prevena  BLOCK PRE-OP - pre-op labs    Last Vitals:Temperature: 36.4 C (97.5 F) (04/21/17 1504)  Heart Rate: 88 (04/21/17 1600)  BP (Non-Invasive): (!) 142/92 (04/21/17 1600)  Respiratory Rate: 15 (04/21/17 1600)  SpO2-1: 95 % (04/21/17 1600)  Pain Score (Numeric, Faces): 7 (04/21/17 1617)    Patient location during evaluation: PACU   Post-procedure handoff checklist completed    Patient participation: complete - patient participated  Level of consciousness: awake and alert and responsive to verbal stimuli  Pain management: adequate  Airway patency: patent  Anesthetic complications: no  Cardiovascular status: acceptable  Respiratory status: acceptable  Hydration status: acceptable  Patient post-procedure temperature: Pt Normothermic   PONV Status: Absent

## 2017-04-21 NOTE — Anesthesia Transfer of Care (Signed)
ANESTHESIA TRANSFER OF CARE   Kenneth Weeks is a 67 y.o. ,male, Weight: 121.7 kg (268 lb 4.8 oz)   had Procedure(s) with comments:  REPAIR HERNIA INCISIONAL - Incisional wound vac applied, Prevena  BLOCK PRE-OP - pre-op labs  performed  04/21/17   Primary Service: Leticia Clas, MD    Past Medical History:   Diagnosis Date   . A-fib (CMS HCC)     Chronic anti-coagulant therapy; Hx cardioversions   . Abdominal hernia     Ventral hernia; Left Inguinal hernia   . Acute on chronic renal failure (CMS HCC)    . Biliary and gallbladder disorder     "Gallstones"   . Blood thinned due to long-term anticoagulant use    . Bruises easily    . Cancer (CMS HCC) 04/09/2017    Colon grade 0 surgery   . Congestive heart failure (CMS HCC) 04/09/2017    CHF 01/2017    . Diabetes mellitus (CMS [place])     type ii diet controlled   . Disorder of liver 04/09/2017    fatty liver   . Dysrhythmias     a fib   . Ejection fraction < 50%    . Heart murmur    . Hemorrhage of gastrointestinal tract 5/16   . Hemorrhagic shock (CMS HCC)    . Hepatic cirrhosis (CMS HCC) 16/11/9602    non alcoholic   . Hx of transfusion 04/09/2017    no reaction   . Hydrocele    . Hypertension 07/10/2014   . Hypothyroid    . Marfan syndrome    . Neck problem 04/09/2017    LROM   . Non-healing non-surgical wound    . Obesity    . Palpitations    . Peripheral vascular disease (CMS [place]) 04/09/2017    no recent problems since starting lasix in 01/2017   . Syncope    . Thyroid disease     hypothyroid   . Transfusion history     s/p Multiple units PRC/FFP this adm.   . Type 2 diabetes mellitus (CMS [place]) 04/09/2017    diet controlled, HA1C 6.0   . Valvular disease 04/09/2017    St Judes aortic valve replacement-chronic anti-coagulant therapy   . Wears glasses       Allergy History as of 04/21/17     FEXOFENADINE       Noted Status Severity Type Reaction    07/26/14 1252 CoxDonnalee Curry 07/26/14 Active    Other Adverse Reaction (Add comment)    Comments:  "retain fluid"             ANTIHISTAMINES - ALKYLAMINE       Noted Status Severity Type Reaction    04/09/17 0856 Lucie Leather, RN 04/09/17 Active       Comments:  Pt states antihistamines cause him to retain fluid      04/09/17 0856 Lucie Leather, RN 04/09/17 Active                 I completed my transfer of care / handoff to the receiving personnel during which we discussed:  Access, Airway, All key/critical aspects of case discussed, Analgesia, Antibiotics, Expectation of post procedure, Fluids/Product, Gave opportunity for questions and acknowledgement of understanding, Labs and PMHx    Post Location: PACU  Additional Info:Patient to PACU on 10L Simple mask. Report to RN at bedside. Airway patent. Patient appears comfortable at this time.                       Last OR Temp: Temperature: 36.4 C (97.5 F)  ABG:  PH   Date Value Ref Range Status   07/13/2014 7.390 7.350 - 7.450 pH Final     PCO2   Date Value Ref Range Status   07/13/2014 37.0 36.2 - 46.2 mm Hg Final     PO2   Date Value Ref Range Status   07/13/2014 106 (HH) 72 - 100 mm Hg Final     SODIUM   Date Value Ref Range Status   07/13/2014 133 (L) 136 - 145 mmol/L Final     POTASSIUM   Date Value Ref Range Status   03/24/2017 3.9 3.5 - 5.1 mmol/L Final   07/26/2014 4.9 3.5 - 5.1 mmol/L Final     KETONES   Date Value Ref Range Status   12/14/2016 Negative Negative mg/dL Final   07/10/2014 5 (A) NEGATIVE mg/dL Final     KETONES,URINE   Date Value Ref Range Status   07/09/2014 NEGATIVE NEGATIVE mg/dL Final     WHOLE BLOOD K+   Date Value Ref Range Status   07/13/2014 3.9 3.5 - 5.0 mmol/L Final     CHLORIDE   Date Value Ref Range Status   07/13/2014 107 96 - 111 mmol/L Final     CALCIUM   Date Value Ref Range Status   03/24/2017 8.8 8.5 - 10.2 mg/dL Final   07/26/2014 9.4 8.5 - 10.4 mg/dL Final     Calculated R Axis   Date Value Ref Range Status   10/23/2015 16 degrees Final     Calculated T Axis   Date Value Ref Range Status    10/23/2015 139 degrees Final     IONIZED CALCIUM   Date Value Ref Range Status   07/13/2014 1.14 (L) 1.30 - 1.46 mmol/L Final     GLUCOSE, POINT OF CARE   Date Value Ref Range Status   07/18/2014 123 (H) 70 - 105 mg/dL Final     LACTATE   Date Value Ref Range Status   07/13/2014 0.7 <1.3 mmol/L Final     HEMOGLOBIN   Date Value Ref Range Status   07/13/2014 8.9 (L) 14.0 - 18.0 g/dL Final     OXYHEMOGLOBIN   Date Value Ref Range Status   07/13/2014 96.0 85.0 - 98.0 % Final     CARBOXYHEMOGLOBIN   Date Value Ref Range Status   07/13/2014 1.8 0.0 - 2.5 % Final     MET-HEMOGLOBIN   Date Value Ref Range Status   07/13/2014 1.2 0.0 - 3.0 % Final     BASE EXCESS   Date Value Ref Range Status   07/13/2014 Test Not Performed 0.0 - 3.0 mmol/L Final     BASE DEFICIT   Date Value Ref Range Status   07/13/2014 2.3 0.0 - 3.0 mmol/L Final     BICARBONATE   Date Value Ref Range Status   07/13/2014 23.1 20.0 - 29.0 mmol/L Final     TEMPERATURE, COMP   Date Value Ref Range Status   07/13/2014 36.0 15.0 - 40.0 C Final     %FIO2   Date Value Ref Range Status   07/13/2014 58 21 - 100 % Final     Airway:   Blood pressure Marland Kitchen)  133/103, pulse 94, temperature 36.4 C (97.5 F), resp. rate 17, height 1.905 m (_0 ), weight 121.7 kg (268 lb 4.8 oz), SpO2 98 %.

## 2017-04-22 ENCOUNTER — Inpatient Hospital Stay (HOSPITAL_COMMUNITY): Payer: Medicare Other

## 2017-04-22 DIAGNOSIS — R140 Abdominal distension (gaseous): Secondary | ICD-10-CM

## 2017-04-22 DIAGNOSIS — K5900 Constipation, unspecified: Secondary | ICD-10-CM

## 2017-04-22 LAB — CBC WITH DIFF
BASOPHIL #: 0.03 x10ˆ3/uL (ref 0.00–0.20)
BASOPHIL %: 0 %
EOSINOPHIL #: 0.01 x10ˆ3/uL (ref 0.00–0.50)
HCT: 40.8 % (ref 36.7–47.0)
LYMPHOCYTE #: 0.55 x10ˆ3/uL — ABNORMAL LOW (ref 1.00–4.80)
LYMPHOCYTE %: 7 %
MCH: 31 pg (ref 27.4–33.0)
MCHC: 33.6 g/dL (ref 32.5–35.8)
MCV: 92.2 fL (ref 78.0–100.0)
MONOCYTE #: 0.58 x10ˆ3/uL (ref 0.30–1.00)
MONOCYTE %: 7 %
MPV: 9.4 fL (ref 7.5–11.5)
NEUTROPHIL #: 7.34 x10ˆ3/uL (ref 1.50–7.70)
NEUTROPHIL %: 86 %
PLATELETS: 142 10*3/uL (ref 140–450)
PLATELETS: 142 x10ˆ3/uL (ref 140–450)
RBC: 4.43 10*6/uL (ref 4.06–5.63)
RDW: 17 % — ABNORMAL HIGH (ref 12.0–15.0)
WBC: 8.5 x10ˆ3/uL (ref 3.5–11.0)

## 2017-04-22 LAB — BASIC METABOLIC PANEL
ANION GAP: 8 mmol/L (ref 4–13)
BUN/CREA RATIO: 14 (ref 6–22)
BUN: 15 mg/dL (ref 8–25)
CALCIUM: 9.1 mg/dL (ref 8.5–10.2)
CHLORIDE: 99 mmol/L (ref 96–111)
CO2 TOTAL: 25 mmol/L (ref 22–32)
CREATININE: 1.05 mg/dL (ref 0.62–1.27)
ESTIMATED GFR: >59 mL/min/1.73mˆ2 (ref >59)
GLUCOSE: 118 mg/dL (ref 65–139)
POTASSIUM: 4.4 mmol/L (ref 3.5–5.1)
SODIUM: 132 mmol/L — ABNORMAL LOW (ref 136–145)

## 2017-04-22 LAB — MAGNESIUM: MAGNESIUM: 1.5 mg/dL — ABNORMAL LOW (ref 1.6–2.5)

## 2017-04-22 LAB — POC BLOOD GLUCOSE (RESULTS)
GLUCOSE, POC: 156 mg/dl — ABNORMAL HIGH (ref 70–105)
GLUCOSE, POC: 114 mg/dl — ABNORMAL HIGH (ref 70–105)

## 2017-04-22 LAB — PT/INR: PROTHROMBIN TIME: 16.3 seconds — ABNORMAL HIGH (ref 9.5–14.1)

## 2017-04-22 LAB — HISTORICAL SURGICAL PATHOLOGY SPECIMEN

## 2017-04-22 LAB — PHOSPHORUS: PHOSPHORUS: 3.3 mg/dL (ref 2.3–4.0)

## 2017-04-22 MED ORDER — MAGNESIUM SULFATE 4 GRAM/100 ML (4 %) IN WATER INTRAVENOUS PIGGYBACK
4.0000 g | INJECTION | Freq: Once | INTRAVENOUS | Status: AC
Start: 2017-04-22 — End: 2017-04-22
  Administered 2017-04-22: 4 g via INTRAVENOUS
  Administered 2017-04-22: 0 g via INTRAVENOUS
  Filled 2017-04-22: qty 100

## 2017-04-22 MED ORDER — MAGNESIUM OXIDE 400 MG (241.3 MG MAGNESIUM) TABLET
400.0000 mg | ORAL_TABLET | Freq: Two times a day (BID) | ORAL | Status: DC
Start: 2017-04-22 — End: 2017-04-30
  Administered 2017-04-22 – 2017-04-30 (×17): 400 mg via ORAL
  Filled 2017-04-22 (×17): qty 1

## 2017-04-22 MED ADMIN — sodium chloride 0.9 % intravenous solution: ORAL | @ 20:00:00 | NDC 00338004904

## 2017-04-22 MED ADMIN — electrolyte-A intravenous solution: @ 12:00:00 | NDC 00338022104

## 2017-04-22 NOTE — Nurses Notes (Signed)
Patient was seen by Dr. Milinda Pointer of General Surgery [facility] for abdominal pain and distention. Patient left unit, via bed transport, for X-ray of abdomen.

## 2017-04-22 NOTE — Nurses Notes (Signed)
1004 (Caesar) Patient requesting Mylicon for gas and bowel regimen for abdominal bloating and pain. Thanks [phone]

## 2017-04-22 NOTE — OR Surgeon (Signed)
[facility] SUMMARY     Patient Name: Kenneth Weeks  Patient MRN: [id number]  Patient DOB: 02-19-1950  Date of Service: 04/21/2017    PREOPERATIVE DIAGNOSIS:   1. Incisional ventral hernia  2. Marfan's syndrome     POSTOPERATIVE DIAGNOSIS:   1. Incisional ventral hernia  2. Marfan's syndrome     NAME OF PROCEDURE:   1. Ventral hernia repair with only Prolene mesh  2. Lysis of adhesions     ATTENDING SURGEON:  Dr. Mel Almond    ASSISTANTS:  Dr. Tyrone Schimke, PGY V    ANESTHESIA:  General and block    ESTIMATED BLOOD LOSS: 50 cc     COMPLICATIONS: No immediate complications     INDICATIONS FOR PROCEDURE: This is a 67 y.o. male with a PMH significant for Marfan's syndrome, atrial fibrillation, valvular heart disease s/p St Jude AVR on coumadin, CHF with EF 45% and type II DM. He has a PSH significant for a right hemicolectomy for adenocarcinoma and GI bleed on 07/13/2014. He presented to general surgery clinic on 04/09/17 with complaints of an incisional ventral hernia. The hernia developed a few years ago after his hemicolectomy. He reported intermittent symptoms of obstruction leading to hospitalizations. After a discussion of the risks, benefits and alternatives to open incisional ventral hernia repair the patient provided informed consent for the procedure.     FINDINGS: Transverse colon heavily scarred to the hernia sac and carefully taken down sharply. No serosal injury to the bowel as a result. Hernia sac excised. Defect closed transversely with Prolene mesh onlay. Provena placed on wound.    DESCRIPTION OF PROCEDURE:  The patient was taken to the operating room and placed supine on the operating table. Appropriate monitors were placed. General endotracheal anesthesia was administered uneventfully. The patient's abdomen was prepped and draped in a sterile manner.  Preoperative antibiotics were given. A preoperative timeout was performed.     A midline incision was made  using a scalpel just superior to the umbilicus over the area of the hernia defect. The incision was carried down through subcutaneous tissues using bovie electrocautery. The hernia sac was encountered and then dissected free from the surrounding subcutaneous tissues using a mix of blunt dissection and bovie electrocautery. The surrounding tissues were densely adherent and scarred to the sac. In order to better delineate sac and fascia, we elected to enter the sac sharply taking care not to injury any of the contents. The sac contained colon which was looped into the sac and densely adherent to the sac on each side of the bowel loop. Carefully, we used sharp dissection to lyse the adhesions from the bowel to the hernia sac. The bowel was removed from the hernia sac without any serosal injury as a result. With further dissection we were able to identify an additional defect at the umbilicus. The skin incision was extended inferiorly. The fascial bridge between the two defects was divided leaving one hernia defect. Next, the subcutaneous tissues were further dissected to reveal the fascial edges. The hernia sac was excised and sent for permanent pathology. The remaining defect measured approximately 13 cm x 7 cm with the longest dimension in the horizontal plane. The hernia was most tension free when approximated transversely.     We then turned our attention to the repair of the defect. The fascia was first closed with a running #1 PDS suture transversely. Next, an onlay mesh repair was performed using  a Prolene mesh cut to size and then placed in quilting type technique with interrupted 0 ethibond sutures. The wound was irrigated with sterile saline. The wound was noted to be hemostatic. Next, a 2-0 Vicryl was used to closed the subcutaneous tissues in interrupted fashion. The skin was closed with staples. A provena was placed over the incision and connected to the vac cannister. The patient emerged from general  endotracheal anesthesia uneventfully. He was taken to PACU in stable condition. All counts were reported correct x2 at the end of the case.     Dr. Mel Almond was present, scrubbed and immediately available at all times through out the case.     Para Skeans, MD    I was present, scrubbed for, and participated in all portions of the case.    Avelina Laine, MD

## 2017-04-22 NOTE — Care Management Notes (Addendum)
[facility] Management Initial Evaluation    Patient Name: Kenneth Weeks  Date of Birth: Oct 01, 1950  Sex: male  Date/Time of Admission: 04/21/2017 10:13 AM  Room/Bed: 04/A  Payor: MEDICARE / Plan: MEDICARE PART A AND B / Product Type: Medicare /   Primary Care Providers:  Viona Gilmore, MD, MD (General)  Viona Gilmore, MD, MD (Claims Attributed)    Pharmacy Info:   Preferred Pharmacy     [place], [place]    [address] [place] [address]    Phone: [phone] Fax: [phone]    Not a 24 hour pharmacy; exact hours not known    [facility] [place], [place] - [address]    [address] [place] [address]    Phone: [phone] Fax: [phone]    Not a 24 hour pharmacy; exact hours not known        Emergency Contact Info:   Extended Emergency Contact Information  Primary Emergency Contact: Kristian Covey  Address: [address]           [place], [place] [address] [place] Phone: [phone]  Relation: Wife  Preferred language: English  Interpreter needed? No    History:   Kenneth Weeks is a 67 y.o., male, admitted 04/21/17.     Height/Weight: 190.5 cm ('6\' 3"' ) / 121.7 kg (268 lb 4.8 oz)     LOS: 1 day   Admitting Diagnosis: Ventral hernia [K43.9]    Assessment:      04/22/17 1400   Assessment Details   Assessment Type Admission   Date of Care Management Update 04/22/17   Date of Next DCP Update 04/23/17   Readmission   Is this a readmission? Yes   Number of days between last admission and this admission? 60   Were your symptoms the same as before? yes, was planned readmit for a surgery, referred back by clinic after a f/u appointment.    Did your support systems work?  Yes   Are you repsonsible for setting up/taking your own medications? Is this working?  Yes   Did you call your PCP/Home [place] Provider  Yes   What was the Providers  response? PCP is aware of condition/surgery per patient.    Were you able to attend your hospital follow up? Yes   If you had d/c resources set up proir to discharge what were they, and were you seen by them? Yes   D/C resources set up prior to discharge returned for f/u appointments.    Did you have any barriers for a succesful discharge? Cost of meds, food, transportation No   Care Management Plan   Discharge Planning Status initial meeting   Projected Discharge Date 04/22/17   Discharge Needs Assessment   Equipment Currently Used at Home   (has FWW from a previous illness but does not need it now or use it currently)   Equipment Needed After Discharge none   Discharge Facility/Level of Care Needs Home (Patient/Family Member/other)(code 1)   Referral Information   Admission Type inpatient   Address Verified verified-no changes   Arrived From home or self-care   Insurance Verified verified-no change  (no part D (no prescription coverage))   Employment/Financial   Financial Concerns inadequate insurance coverage  (no prescription coverage. )   Living Environment   Living Arrangements  house  (3 level home. Bed room on upper floor but if needed, pt can sleep in recliner in main floor, did so with previous illness. )   Able to Return to Prior Arrangements yes   Home Safety   Home Assessment: Stairs in Home   Home Accessibility stairs within home;stairs to enter home  (only 1 step to enter, 13 to second floor (bedroom))   Living Environment   Number of Stairs Within Home 13   Number of Stairs to Enter Home 1     Met with patient and wife to complete initial assessment above. Dr. Tresa Endo indicates may be able to discharge today if lovenox can be approved. Needs 120 mg lovenox BID for 7 days. Bridge to coumadin which pt has been on long term. Pt stopped for planned surgery.     MSW sent to check benefits but care management assistant reported back no info on prescription coverage known.   Discussed with patient. He has no  part D. States he had it but because he signed up for part D there was a "late penalty" every month and stated he disagreed with that (as was told it would be there for the rest of his life-monthly late fee) so he dropped coverage. Patient reports has lovenox (4 syringes of same dose) at home, recently filled.   States [facility]. MSW checked discharge pharmacy but would be $216.00 for patient to pay cash here and discharge pharmacy reported they do not accept discount coupons.   Patient updated, got price of $90 some dollars at [facility], plans to fill again there. Advised Dr. Tresa Endo of above and that pt only needing 5 day supply ordered as has 2 day supply at home.     Discharge Plan:  Home (Patient/Family Member/other) (code 1)      The patient will continue to be evaluated for developing discharge needs.     Case Manager: Charlann Boxer, MSW  Phone: [phone]

## 2017-04-22 NOTE — Care Management Notes (Signed)
MSW checked with patient. States physicians have met with him and plan to discharge in morning. Patient having some gas pain, has not had BM yet.

## 2017-04-22 NOTE — Nurses Notes (Signed)
Patient experiencing intense gas pain, rating it as a 10. PRN Roxicodone given. Patient has ben unable to pass flatulence or have a BM.  [facility] notified of situation.

## 2017-04-22 NOTE — Care Plan (Signed)
Patient has no rx coverage but has discount card and plans to fill lovenox at [facility] back home. Plan to discharge in morning.

## 2017-04-22 NOTE — Progress Notes (Signed)
Surgery Follow-up Note    67 y.o. Male with ventral hernia presents for elective repair now s/p open ventral hernia repair with onlay prolene mesh and adhesiolysis (04/21/17)    Paged regarding patient having increased pain and distention. Presented to patient's bedside. Patient is lying in RLD position and appears uncomfortable. He states that his abdomen feels more distended than earlier in the day. He denies any nausea or vomiting. He is afebrile and hemodynamically stable. He is passing flatus; denies any BM. Abdomen is distended and firm on exam but not rigid, markedly tender to palpation to the right of patient's incision/vac.     -will order KUB to assess patient's distention     Please call/page [facility] with any questions, concerns, or changes in the interim     Edythe Clarity, MD   [facility]  Department of Urology - PGY1  04/22/2017, 22:25     CT scan ordered given concerning exam revealed large hematoma with concern for active blush on CT scan. Stat CBC ordered. Patient consented and card dropped for OR for hematoma    Edythe Clarity, MD  04/23/2017, 04:53      I saw and examined the patient.  I reviewed the resident's note.  I agree with the findings and plan of care as documented in the resident's note.  Any exceptions/additions are edited/noted.    Leticia Clas, MD

## 2017-04-22 NOTE — Progress Notes (Signed)
67 y.o. Male with ventral hernia presents for elective repair now s/p open ventral hernia repair with onlay prolene mesh and adhesiolysis (04/21/17)    Patient complains of mild abdominal pain. States that he did not eat anything last night because he wasn't hungry. Denies F/C, N/V/ CP/SOB. Admits flatus, denies BM    Filed Vitals:    04/22/17 0500 04/22/17 0653 04/22/17 0814 04/22/17 0920   BP:  123/68  130/77   Pulse:  98  100   Resp:  17  18   Temp:  36.5 C (97.7 F)  36.6 C (97.9 F)   SpO2: 95%  95%      Gen- NAD, AOx3  CV- RRR  Pulm- non-labored  Abd- ND, Provena intact w/ good suction and surrounding skin without erythema or induration, soft, moderately tender at incision  Ext- no cyanosis or edema    Date 04/21/17 0700 - 04/22/17 0659 04/22/17 0700 - 04/23/17 0659   Shift 0700-1459 1500-2259 2300-0659 24 Hour Total 0700-1459 1500-2259 2300-0659 24 Hour Total   INTAKE   P.O.   480 480 360   360     Oral   480 480 360   360   I.V.(mL/kg/hr) 1000(1.03)   1000(0.34)         Volume (LR premix infusion) 1000   1000       Shift Total(mL/kg) 1000(8.22)  480(3.94) 1480(12.16) 360(2.96)   360(2.96)   OUTPUT   Urine(mL/kg/hr)   400(0.41) 400(0.14) 325   325     Urine (Voided)   400 400 325   325   Blood 25   25         EBL 25   25       Shift Total(mL/kg) 25(0.21)  400(3.29) 425(3.49) 325(2.67)   325(2.67)   Weight (kg) 121.7 121.7 121.7 121.7 121.7 121.7 121.7 121.7     I have reviewed all lab results.  Lab Results Today:    Results for orders placed or performed during the hospital encounter of 04/21/17 (from the past 24 hour(s))   POC BLOOD GLUCOSE (RESULTS)   Result Value Ref Range    GLUCOSE, POC 121 (H) 70 - 105 mg/dl   POC BLOOD GLUCOSE (RESULTS)   Result Value Ref Range    GLUCOSE, POC 156 (H) 70 - 105 mg/dl   BASIC METABOLIC PANEL   Result Value Ref Range    SODIUM 132 (L) 136 - 145 mmol/L    POTASSIUM 4.4 3.5 - 5.1 mmol/L    CHLORIDE 99 96 - 111 mmol/L    CO2 TOTAL 25 22 - 32 mmol/L    ANION GAP 8 4 - 13  mmol/L    CALCIUM 9.1 8.5 - 10.2 mg/dL    GLUCOSE 118 65 - 139 mg/dL    BUN 15 8 - 25 mg/dL    CREATININE 1.05 0.62 - 1.27 mg/dL    BUN/CREA RATIO 14 6 - 22    ESTIMATED GFR >59 >59 mL/min/1.4m^2   MAGNESIUM   Result Value Ref Range    MAGNESIUM 1.5 (L) 1.6 - 2.5 mg/dL   PHOSPHORUS   Result Value Ref Range    PHOSPHORUS 3.3 2.3 - 4.0 mg/dL   PT/INR   Result Value Ref Range    PROTHROMBIN TIME 16.3 (H) 9.5 - 14.1 seconds    INR 1.39 (H) 0.80 - 1.20   CBC WITH DIFF   Result Value Ref Range    WBC 8.5 3.5 - 11.0 x10^3/uL  RBC 4.43 4.06 - 5.63 x10^6/uL    HGB 13.7 12.5 - 16.3 g/dL    HCT 40.8 36.7 - 47.0 %    MCV 92.2 78.0 - 100.0 fL    MCH 31.0 27.4 - 33.0 pg    MCHC 33.6 32.5 - 35.8 g/dL    RDW 17.0 (H) 12.0 - 15.0 %    PLATELETS 142 140 - 450 x10^3/uL    MPV 9.4 7.5 - 11.5 fL    NEUTROPHIL % 86 %    LYMPHOCYTE % 7 %    MONOCYTE % 7 %    EOSINOPHIL % 0 %    BASOPHIL % 0 %    NEUTROPHIL # 7.34 1.50 - 7.70 x10^3/uL    LYMPHOCYTE # 0.55 (L) 1.00 - 4.80 x10^3/uL    MONOCYTE # 0.58 0.30 - 1.00 x10^3/uL    EOSINOPHIL # 0.01 0.00 - 0.50 x10^3/uL    BASOPHIL # 0.03 0.00 - 0.20 x10^3/uL   POC BLOOD GLUCOSE (RESULTS)   Result Value Ref Range    GLUCOSE, POC 114 (H) 70 - 105 mg/dl     67 y.o. Male with ventral hernia presents for elective repair now s/p open ventral hernia repair with onlay prolene mesh and adhesiolysis (04/21/17)  -admitted for bridging to coumadin therapy   -restarted home dose coumadin 7.5   -bridging with tx Lovenox 120 BID   -will discuss with care management benefits for home Lovenox so can be discharged and transition back to coumadin at home  -pain control (tylenol schd, dildaudid prn, robaxin prn, oxycodone prn)  -regular diet  -DVT ppx (SCD, tx Lovenox)  -OOBA/tc  -IS  -discharge pending coumadin becoming therapeutic or approval of home tx Lovenox    Leida Lauth, MD  04/22/2017, 12:47      I saw and examined the patient.  I reviewed the resident's note.  I agree with the findings and plan of care as  documented in the resident's note.  Any exceptions/additions are edited/noted.    Leticia Clas, MD

## 2017-04-22 NOTE — Care Plan (Signed)
Problem: Adult Inpatient Plan of Care  Goal: Absence of Hospital-Acquired Illness or Injury  Outcome: Ongoing (see interventions/notes)     Problem: Fall Injury Risk  Goal: Absence of Fall and Fall-Related Injury  Outcome: Ongoing (see interventions/notes)     Problem: Pain Acute  Goal: Optimal Pain Control  Outcome: Ongoing (see interventions/notes)

## 2017-04-22 NOTE — Nurses Notes (Addendum)
2130: Patient weaned to 1L NC  (94%).  2230: Patient now on RA (94%).  0100: SpO2 94% RA  0300: SpO2 94% RA  0500: Patient maintain SpO2  94% RA

## 2017-04-22 NOTE — Nurses Notes (Signed)
Patient lab result: Sodium 132  [facility].Blue notified.

## 2017-04-23 ENCOUNTER — Encounter (HOSPITAL_COMMUNITY)
Admission: RE | Disposition: A | Discharge status: Home Health | Payer: Self-pay | Source: Ambulatory Visit | Attending: Surgery

## 2017-04-23 ENCOUNTER — Encounter (HOSPITAL_COMMUNITY): Payer: Self-pay

## 2017-04-23 ENCOUNTER — Inpatient Hospital Stay (HOSPITAL_COMMUNITY): Payer: Medicare Other

## 2017-04-23 ENCOUNTER — Inpatient Hospital Stay (HOSPITAL_COMMUNITY): Payer: Medicare Other | Admitting: Student in an Organized Health Care Education/Training Program

## 2017-04-23 DIAGNOSIS — L7634 Postprocedural seroma of skin and subcutaneous tissue following other procedure: Secondary | ICD-10-CM

## 2017-04-23 DIAGNOSIS — M7981 Nontraumatic hematoma of soft tissue: Secondary | ICD-10-CM

## 2017-04-23 DIAGNOSIS — K439 Ventral hernia without obstruction or gangrene: Secondary | ICD-10-CM

## 2017-04-23 DIAGNOSIS — Q87410 Marfan's syndrome with aortic dilation: Secondary | ICD-10-CM

## 2017-04-23 DIAGNOSIS — R109 Unspecified abdominal pain: Secondary | ICD-10-CM

## 2017-04-23 LAB — ARTERIAL BLOOD GAS/LACTATE/CO-OX/LYTES (NA/K/CA/CL/GLUC) (TEMP COMP)
%FIO2 (ARTERIAL): 94 %
(T) PCO2: 35 mm/Hg (ref 35.0–45.0)
(T) PO2: 431 mm/Hg — ABNORMAL HIGH (ref 72.0–100.0)
BASE DEFICIT: 0.9 mmol/L (ref 0.0–3.0)
BICARBONATE (ARTERIAL): 24.2 mmol/L (ref 18.0–26.0)
CARBOXYHEMOGLOBIN: 1.7 % (ref 0.0–2.5)
CHLORIDE: 97 mmol/L (ref 96–111)
GLUCOSE: 204 mg/dL — ABNORMAL HIGH (ref 60–105)
HEMOGLOBIN: 9.9 g/dL — ABNORMAL LOW (ref 12.0–18.0)
IONIZED CALCIUM: 1.15 mmol/L (ref 1.10–1.30)
LACTATE: 3 mmol/L — ABNORMAL HIGH (ref 0.0–1.3)
MET-HEMOGLOBIN: 1.3 % (ref 0.0–2.0)
O2CT: 14.7 % — ABNORMAL LOW (ref 15.7–24.3)
OXYHEMOGLOBIN: 96.9 % (ref 85.0–98.0)
PAO2/FIO2 RATIO: 465 (ref <=200)
PCO2 (ARTERIAL): 37 mm/Hg (ref 35.0–45.0)
PH (ARTERIAL): 7.41 (ref 7.35–7.45)
PH (T): 7.42 (ref 7.35–7.45)
PO2 (ARTERIAL): 437 mm/Hg — ABNORMAL HIGH (ref 72.0–100.0)
SODIUM: 126 mmol/L — ABNORMAL LOW (ref 136–145)
TEMPERATURE, COMP: 36 C (ref 15.0–40.0)
WHOLE BLOOD POTASSIUM: 5.4 mmol/L — ABNORMAL HIGH (ref 3.5–5.0)

## 2017-04-23 LAB — BASIC METABOLIC PANEL
ANION GAP: 7 mmol/L (ref 4–13)
BUN/CREA RATIO: 22 (ref 6–22)
BUN: 33 mg/dL — ABNORMAL HIGH (ref 8–25)
CALCIUM: 8.5 mg/dL (ref 8.5–10.2)
CHLORIDE: 97 mmol/L (ref 96–111)
ESTIMATED GFR: 47 mL/min/{1.73_m2} — ABNORMAL LOW (ref >59)
GLUCOSE: 138 mg/dL (ref 65–139)
POTASSIUM: 4.4 mmol/L (ref 3.5–5.1)

## 2017-04-23 LAB — CBC WITH DIFF
BASOPHIL %: 0 %
BASOPHIL %: 0 %
EOSINOPHIL #: 0.03 10*3/uL (ref 0.00–0.50)
EOSINOPHIL %: 0 %
HCT: 31.7 % — ABNORMAL LOW (ref 36.7–47.0)
HGB: 10.7 g/dL — ABNORMAL LOW (ref 12.5–16.3)
LYMPHOCYTE %: 6 %
MCH: 30.7 pg (ref 27.4–33.0)
MCHC: 33.8 g/dL (ref 32.5–35.8)
MCV: 91 fL (ref 78.0–100.0)
MONOCYTE #: 0.99 x10ˆ3/uL (ref 0.30–1.00)
MONOCYTE %: 7 %
MPV: 9.6 fL (ref 7.5–11.5)
NEUTROPHIL %: 86 %
PLATELETS: 186 x10ˆ3/uL (ref 140–450)
RBC: 3.48 x10ˆ6/uL — ABNORMAL LOW (ref 4.06–5.63)
WBC: 14.2 x10ˆ3/uL — ABNORMAL HIGH (ref 3.5–11.0)

## 2017-04-23 LAB — CBC
HGB: 9 g/dL — ABNORMAL LOW (ref 12.5–16.3)
MCH: 31.4 pg (ref 27.4–33.0)
MCHC: 34.6 g/dL (ref 32.5–35.8)
MPV: 9.1 fL (ref 7.5–11.5)
PLATELETS: 166 10*3/uL (ref 140–450)
RBC: 2.86 10*6/uL — ABNORMAL LOW (ref 4.06–5.63)
RDW: 16.7 % — ABNORMAL HIGH (ref 12.0–15.0)
WBC: 10.6 10*3/uL (ref 3.5–11.0)

## 2017-04-23 LAB — POC BLOOD GLUCOSE (RESULTS): GLUCOSE, POC: 155 mg/dl — ABNORMAL HIGH (ref 70–105)

## 2017-04-23 SURGERY — IRRIGATION AND DEBRIDEMENT ABDOMEN
Anesthesia: General | Site: Abdomen | Wound class: Dirty or Infected Wounds-Include old traumatic wounds

## 2017-04-23 MED ORDER — SUCCINYLCHOLINE CHLORIDE 200 MG/10 ML (20 MG/ML) INTRAVENOUS SYRINGE
INJECTION | Freq: Once | INTRAVENOUS | Status: DC | PRN
Start: 2017-04-23 — End: 2017-04-23
  Administered 2017-04-23: 200 mg via INTRAVENOUS

## 2017-04-23 MED ORDER — SODIUM CHLORIDE 0.9 % IRRIGATION SOLUTION
1000.0000 mL | Status: DC | PRN
Start: 2017-04-23 — End: 2017-04-23
  Administered 2017-04-23: 3000 mL

## 2017-04-23 MED ORDER — PROPOFOL 10 MG/ML IV BOLUS
INJECTION | Freq: Once | INTRAVENOUS | Status: DC | PRN
Start: 2017-04-23 — End: 2017-04-23
  Administered 2017-04-23: 200 mg via INTRAVENOUS

## 2017-04-23 MED ORDER — SODIUM CHLORIDE 0.9 % (FLUSH) INJECTION SYRINGE
2.0000 mL | INJECTION | Freq: Three times a day (TID) | INTRAMUSCULAR | Status: DC
Start: 2017-04-23 — End: 2017-04-23

## 2017-04-23 MED ORDER — LIDOCAINE (PF) 100 MG/5 ML (2 %) INTRAVENOUS SYRINGE
INJECTION | Freq: Once | INTRAVENOUS | Status: DC | PRN
Start: 2017-04-23 — End: 2017-04-23
  Administered 2017-04-23: 50 mg via INTRAVENOUS

## 2017-04-23 MED ORDER — LACTATED RINGERS INTRAVENOUS SOLUTION
INTRAVENOUS | Status: DC
Start: 2017-04-23 — End: 2017-04-23

## 2017-04-23 MED ORDER — SODIUM CHLORIDE 0.9 % IRRIGATION SOLUTION
50000.0000 [IU] | Freq: Once | Status: DC | PRN
Start: 2017-04-23 — End: 2017-04-24
  Administered 2017-04-23 (×2): 50000 [IU]

## 2017-04-23 MED ORDER — SODIUM CHLORIDE 0.9 % INTRAVENOUS SOLUTION
INTRAVENOUS | Status: DC | PRN
Start: 2017-04-23 — End: 2017-04-23

## 2017-04-23 MED ORDER — PHENYLEPHRINE 0.5 MG/5 ML (100 MCG/ML)IN 0.9 % SOD.CHLORIDE IV SYRINGE
INJECTION | Freq: Once | INTRAVENOUS | Status: DC | PRN
Start: 2017-04-23 — End: 2017-04-23
  Administered 2017-04-23 (×4): 200 ug via INTRAVENOUS

## 2017-04-23 MED ORDER — PHENYLEPHRINE 10 MG IN NS 100 ML INFUSION - FOR ANES
INTRAVENOUS | Status: DC | PRN
Start: 2017-04-23 — End: 2017-04-23
  Administered 2017-04-23: 0.5 ug/kg/min via INTRAVENOUS
  Administered 2017-04-23: 0 ug/kg/min via INTRAVENOUS
  Administered 2017-04-23: .7 ug/kg/min via INTRAVENOUS

## 2017-04-23 MED ORDER — LACTATED RINGERS INTRAVENOUS SOLUTION
INTRAVENOUS | Status: DC
Start: 2017-04-24 — End: 2017-04-24
  Administered 2017-04-24: 0 via INTRAVENOUS

## 2017-04-23 MED ORDER — IOVERSOL 320 MG IODINE/ML INTRAVENOUS SOLUTION
100.00 mL | INTRAVENOUS | Status: AC
Start: 2017-04-23 — End: 2017-04-23
  Administered 2017-04-23: 100 mL via INTRAVENOUS

## 2017-04-23 MED ORDER — DIATRIZOATE MEGLUMINE-DIATRIZOATE SODIUM 66 %-10 % ORAL SOLUTION
8.00 mL | ORAL | Status: AC
Start: 2017-04-23 — End: 2017-04-23
  Administered 2017-04-23: 8 mL via ORAL

## 2017-04-23 MED ORDER — FENTANYL (PF) 50 MCG/ML INJECTION SOLUTION
Freq: Once | INTRAMUSCULAR | Status: DC | PRN
Start: 2017-04-23 — End: 2017-04-23
  Administered 2017-04-23: 100 ug via INTRAVENOUS
  Administered 2017-04-23: 50 ug via INTRAVENOUS

## 2017-04-23 MED ORDER — SODIUM CHLORIDE 0.9 % IV BOLUS
1000.0000 mL | INJECTION | Freq: Once | Status: AC
Start: 2017-04-23 — End: 2017-04-23
  Administered 2017-04-23 (×2): 1000 mL via INTRAVENOUS
  Administered 2017-04-23: 0 mL via INTRAVENOUS

## 2017-04-23 MED ORDER — CEFAZOLIN 1 GRAM SOLUTION FOR INJECTION
Freq: Once | INTRAMUSCULAR | Status: DC | PRN
Start: 2017-04-23 — End: 2017-04-23
  Administered 2017-04-23: 2000 mg via INTRAVENOUS

## 2017-04-23 MED ORDER — FENTANYL (PF) 50 MCG/ML INJECTION SOLUTION
25.0000 ug | INTRAMUSCULAR | Status: DC | PRN
Start: 2017-04-23 — End: 2017-04-24

## 2017-04-23 MED ORDER — LACTATED RINGERS INTRAVENOUS SOLUTION
INTRAVENOUS | Status: DC | PRN
Start: 2017-04-23 — End: 2017-04-23

## 2017-04-23 MED ORDER — FENTANYL (PF) 50 MCG/ML INJECTION SOLUTION
12.5000 ug | INTRAMUSCULAR | Status: DC | PRN
Start: 2017-04-23 — End: 2017-04-24

## 2017-04-23 MED ORDER — SODIUM CHLORIDE 0.9 % (FLUSH) INJECTION SYRINGE
2.0000 mL | INJECTION | INTRAMUSCULAR | Status: DC | PRN
Start: 2017-04-23 — End: 2017-04-23

## 2017-04-23 MED ORDER — VASOPRESSIN 20 UNIT/ML INTRAVENOUS SOLUTION
Freq: Once | INTRAVENOUS | Status: DC | PRN
Start: 2017-04-23 — End: 2017-04-23
  Administered 2017-04-23: 2 [IU] via INTRAVENOUS

## 2017-04-23 MED ORDER — SUGAMMADEX 100 MG/ML INTRAVENOUS SOLUTION
Freq: Once | INTRAVENOUS | Status: DC | PRN
Start: 2017-04-23 — End: 2017-04-23
  Administered 2017-04-23: 300 mg via INTRAVENOUS

## 2017-04-23 MED ADMIN — lidocaine (PF) 100 mg/5 mL (2 %) intravenous syringe: INTRAVENOUS | @ 06:00:00

## 2017-04-23 MED ADMIN — acetaminophen 325 mg tablet: ORAL | @ 05:00:00

## 2017-04-23 MED ADMIN — lactated Ringers intravenous solution: ORAL | @ 22:00:00 | NDC 00338011704

## 2017-04-23 MED ADMIN — nystatin 100,000 unit/gram topical powder: INTRAVENOUS | NDC 00574200802

## 2017-04-23 MED ADMIN — nicotine 14 mg/24 hr daily transdermal patch: INTRAVENOUS | @ 03:00:00

## 2017-04-23 MED ADMIN — lactated Ringers intravenous solution: ORAL | @ 11:00:00 | NDC 00338011704

## 2017-04-23 SURGICAL SUPPLY — 64 items
APPL 70% ISPRP 2% CHG 26ML CHLRPRP HI-LT ORNG PREP STRL LF  DISP CLR (WOUND CARE SUPPLY) IMPLANT
BANDAGE GAUZE DERMACEA ST 6X4 YD CS/48EA 441109 (WOUND CARE SUPPLY) ×2 IMPLANT
BINDER ABDOMINAL 9IN 3 PNL ELAS HKLP CLSR 46-62IN MED LRG NONST LF (ORTHOPEDICS (NOT IMPLANTS)) ×2 IMPLANT
BINDER ABDOMINAL 9IN 3 PNL ELA_S HKLP CLSR 46-62IN ML NONST (ORTHOPEDICS (NOT IMPLANTS)) ×1
BLANKET 3M BAIR HUG ADLT LWR B ODY 60X36IN PLMR AIR SYS LTWT (MISCELLANEOUS PT CARE ITEMS) ×2 IMPLANT
CATH URETH BARD LUBRICATH 28FR FOLEY 2W BAL LUB SIL DDRGL 30CC STRL LTX DISP AMBR (UROLOGICAL SUPPLIES) IMPLANT
CLAMP OSTOMY PLASTIC CURVE TAIL CLSR NONST LF  DISP (SURGICAL INSTRUMENTS) IMPLANT
CONV USE ITEM 108095 - SYRINGE DOVER 60ML LF  STRL IRRG PROTECT CAP BULB (NEEDLES & SYRINGE SUPPLIES) IMPLANT
CONV USE ITEM 124344 - LEGGINGS SURG 48X31IN CUF PRXM SMS 6IN STRL LF  DISP (DRAPE/PACKS/SHEETS/OR TOWEL)
CONV USE ITEM 136676 - DRAPE PCH DRAIN PORT 33.5X32X14IN UNDR BUTT PRXM LF  STRL DISP SURG SMS 46X33.5IN (CUSTOM TRAYS & PACK)
CONV USE ITEM 306873 - COVER 53X24IN MAYOSTAND PRXM STRL DISP EQP SMS LF (DRAPE/PACKS/SHEETS/OR TOWEL)
CONV USE ITEM 337691 - GOWN SURG LRG L4 REINF HKLP CLSR SET IN SLEEVE STRL LF  DISP BLU SIRUS SMS PE 43IN (DGOW)
CONV USE ITEM 337890 - PACK SURG BSIN 2 STRL LF  DISP (CUSTOM TRAYS & PACK) ×2 IMPLANT
CONV USE ITEM 338653 - PACK SURG ABDOMINAL NONST DISP LF (CUSTOM TRAYS & PACK) ×2 IMPLANT
CONV USE ITEM 339094 - PACK CUSTOM COLOSTOMY CLOSURE (CUSTOM TRAYS & PACK) IMPLANT
CONV USE ITEM 339979 - SYRINGE DOVER 60ML LF  STRL IRRG PROTECT CAP BULB (NEEDLES & SYRINGE SUPPLIES)
COVER 53X24IN MAYOSTAND PRXM STRL DISP EQP SMS LF (DRAPE/PACKS/SHEETS/OR TOWEL) IMPLANT
COVER WND RF DETECT STRL CLR EQP (EQUIPMENT MINOR) ×2 IMPLANT
DISC USE ITEM 155682 - RELOAD STAPLER CUTTER GN THICK_CONTOUR CVD CR40G BX/6 (ENDOSCOPIC SUPPLIES) IMPLANT
DISCONTINUED USE ITEM 35894 - DRAPE MAG 16X10IN DVN FOAM FLXB 121 STRL LF  DISP (EQUIPMENT MINOR) IMPLANT
DRAPE FLUID WRMR TEMP CONTROL PORT STAND 44X44IN ORS LF  STRL DISP EQP POLYUR CLR (DRAPE/PACKS/SHEETS/OR TOWEL) ×2 IMPLANT
DRAPE PCH DRAIN PORT 33.5X32X14IN UNDR BUTT PRXM LF  STRL DISP SURG SMS 46X33.5IN (CUSTOM TRAYS & PACK) IMPLANT
DRESS HMST KLN 1 PLY ZFLD QUIKCLOT 4YDX3IN ×6 IMPLANT
DRESS WOUND PRMPR NWVN LF  STRL DISP (WOUND CARE SUPPLY) IMPLANT
DRESSING ABDOM 8 X 10IN 7198D 216/CS (WOUND CARE SUPPLY) ×4 IMPLANT
ELECTRODE ESURG BLADE 4IN 3/32IN EDGE STRL 1.1IN DISP STD SHAFT XTD LF (CAUTERY SUPPLIES) IMPLANT
ELECTRODE ESURG BLADE 4IN 3/32_IN EDGE STRL DISP XTD BEND LF (CAUTERY SUPPLIES)
ELECTRODE ESURG BLADE 6.5IN 3/32IN VLAB STRL SS 1IN DISP STD SHAFT XTD LF (CAUTERY SUPPLIES) IMPLANT
ELECTRODE ESURG XTD BLADE 6.5I_N 3/32IN VLAB STRL SS DISP STD (CAUTERY SUPPLIES)
ELECTRODE PATIENT RTN 9FT VLAB C30- LB RM PHSV ACRL FOAM CORD NONIRRITATE NONSENSITIZE ADH STRP (CAUTERY SUPPLIES) ×2 IMPLANT
GARMENT COMPRESS LRG CALF CENTAURA NYL VASOGRAD LTWT BRTHBL SEQ FIL BLU 24- IN (ORTHOPEDICS (NOT IMPLANTS)) ×2 IMPLANT
GARMENT COMPRESS LRG CALF CENT_AURA NYL VASOGRAD LTWT BRTHBL (ORTHOPEDICS (NOT IMPLANTS)) ×1
GOWN SURG LRG L4 REINF HKLP CLSR SET IN SLEEVE STRL LF  DISP BLU SIRUS SMS PE 43IN (DGOW) IMPLANT
LABEL MED EZ PEEL MRKR LF (LABELS/CHART SUPPLIES) IMPLANT
LEGGINGS SURG 43X29IN CUF PRXM_SMS STRL LF DISP (DRAPE/PACKS/SHEETS/OR TOWEL)
LEGGINGS SURG 48X31IN CUF PRXM SMS 6IN STRL LF  DISP (DRAPE/PACKS/SHEETS/OR TOWEL) IMPLANT
PACK CUSTOM COLOSTOMY CLOSURE (CUSTOM TRAYS & PACK)
PACK SURG ABDOMINAL NONST DISP LF (CUSTOM TRAYS & PACK) ×2
PAD ARMBRD BLU (POSITIONING PRODUCTS) ×4 IMPLANT
POSITION POSITION 9IN DONUT HEAD FOAM (SUPP) ×2 IMPLANT
POUCH 12IN 2.75IN OSTOMY 2 PC TRNSPR DRAINABLE INTGR FILTER LF  N IMG BLU (WOUND CARE SUPPLY) IMPLANT
REMOVER STPL PREM SKIN METAL PLASTIC TIP (ENDOSCOPIC SUPPLIES) ×2 IMPLANT
RETRACTR 34CM ALEXIS O XL CSECT RIGID RING PRTC SHEATH SURG 11-17CM INCS (SURGICAL INSTRUMENTS) IMPLANT
RETRACTR ALEXIS O 360D LRG RIGID RING ATRAUMA UNQ CSECT SURG 9-14CM INCS (SURGICAL INSTRUMENTS) IMPLANT
RING RETRACTR SM LONESTAR NORYL PLASTIC 2 PEEL PCH SLF RTN FLXB LTWT DISP STRL LF  14.1X14.1CM (SURGICAL INSTRUMENTS) IMPLANT
SEALDIVD ESURG 18CM 13.5MM LIGASURE IMPCT 14D 180D 34MM CURVE JAW OVAL SHAFT NANO COAT OPN 36MM VLAB (SUTURE LIGATING CLIPS) IMPLANT
SPONGE LAP 18X18IN STD 4 PLY XRY RF DTBL ABS RFDETECT COTTON STRL LF  DISP (WOUND CARE SUPPLY) IMPLANT
SPONGE LAP 9/16X.25IN DSCT RADOPQ FBR STRL (WOUND CARE SUPPLY) IMPLANT
STAPLER CONTOUR L40 MM X H3.5 MM H1.5 MM REG (INSTRUMENTS ENDOMECHANICAL)
STAPLER INTERNAL 21MMX4.8MM TI STD CIRC LNR CTR SQUEEZE HNDL LRG ANSTM LIP STRL LF  DISP EEA AQ (ENDOSCOPIC SUPPLIES) IMPLANT
STAPLER INTERNAL 37CMX5.5MM CURVE CRC INTRALUM LNR CTR ADJ FLXB STRL LF  DISP EPTH 20.4MM LUM 1-2.5 (ENDOSCOPIC SUPPLIES) IMPLANT
STAPLER INTERNAL 40MMX1.5MM 46 THKTIS CURVE CTR ECHLN CONTOUR BLU 51MM (ENDOSCOPIC SUPPLIES) IMPLANT
STAPLER INTERNAL 5.5MM CURVE CRC INTRALUM LNR CTR ADJ FLXB STRL LF  DISP EPTH 16.4MM LUM 28CMX1-2.5 (ENDOSCOPIC SUPPLIES) IMPLANT
STAPLER INTERNAL 5.5MM CURVE CRC INTRALUM LNR CTR ADJ FLXB STRL LF  DISP EPTH 24.4MM LUM 28CMX1-2.5 (ENDOSCOPIC SUPPLIES) IMPLANT
STAPLER SKIN 4.1X6.5MM 35 W STPL CART LF  APS U DISP CLR SS PLASTIC (ENDOSCOPIC SUPPLIES) ×2 IMPLANT
SUTURE 2-0 KS PROLENE 30IN BLU MONOF NONAB (SUTURE/WOUND CLOSURE) IMPLANT
SUTURE 3-0 SH VICRYL 27IN VIOL BRD COAT ABS (SUTURE/WOUND CLOSURE) IMPLANT
SUTURE 4-0 SH-1 VICRYL 18IN VIOL CR BRD 8 STRN COAT ABS (SUTURE/WOUND CLOSURE) IMPLANT
SUTURE 4-0 VC STPK 18IN VIOL B RD 12 STRN PCUT COAT ABS (SUTURE/WOUND CLOSURE) IMPLANT
SYRINGE 50ML LF  STRL GRAD N-PYRG DEHP-FR PVC FREE CATH TIP DISP CLR (NEEDLES & SYRINGE SUPPLIES) IMPLANT
SYRINGE DOVER 60ML LF  STRL IRRG PROTECT CAP BULB (NEEDLES & SYRINGE SUPPLIES)
TOWEL SURG WHT 25X16IN RFDETECT COTTON RADOPQ RF DTBL STRL LF  DISP (PROTECTIVE PRODUCTS/GARMENTS) IMPLANT
TRAY CATH 16FR BARDEX LUBRICATH STATLK SAF-FLOW FOLEY URMTR ADV NATURAL RUB DDRGL 350ML STRL LTX (TRAY) IMPLANT
TRAY SKIN SCRUB 8IN VNYL COTTON 6 WNG 6 SPONGE STICK 2 TIP APPL DRY STRL LF (KITS & TRAYS (DISPOSABLE)) ×2 IMPLANT

## 2017-04-23 NOTE — Nurses Notes (Signed)
Art line removed. No issues. Patient tolerated well. Gauze and pressure dressing applied.

## 2017-04-23 NOTE — Progress Notes (Signed)
67 y.o. Male with ventral hernia now s/p elective ventral hernia repair with mesh (04/21/17)    Overnight developed abdominal wall hematoma with acute bleeding requiring return to OR. Patient sedated having just exited the OR with minimal communication due to lethargy    Filed Vitals:    04/23/17 1611 04/23/17 1909 04/23/17 2113 04/23/17 2127   BP: (!) 95/56 91/66 (!) 80/68 (!) 84/62   Pulse: (!) 104 (!) 106 (!) 112    Resp: 19 19 (!) 22    Temp:  36.9 C (98.4 F)     SpO2: 98% 94% 90%      Gen- NAD, lethargic  CV- RRR  Pulm- non-labored  Abd- ND, dressings c/d/i, soft, moderately tender  Ext- no cyanosis or edema    I have reviewed all lab results.  Lab Results Today:    Results for orders placed or performed during the hospital encounter of 04/21/17 (from the past 24 hour(s))   PT/INR   Result Value Ref Range    PROTHROMBIN TIME 19.7 (H) 9.5 - 14.1 seconds    INR 1.70 (H) 0.80 - 1.20   CBC WITH DIFF   Result Value Ref Range    WBC 14.2 (H) 3.5 - 11.0 x10^3/uL    RBC 3.48 (L) 4.06 - 5.63 x10^6/uL    HGB 10.7 (L) 12.5 - 16.3 g/dL    HCT 31.7 (L) 36.7 - 47.0 %    MCV 91.0 78.0 - 100.0 fL    MCH 30.7 27.4 - 33.0 pg    MCHC 33.8 32.5 - 35.8 g/dL    RDW 16.6 (H) 12.0 - 15.0 %    PLATELETS 186 140 - 450 x10^3/uL    MPV 9.6 7.5 - 11.5 fL    NEUTROPHIL % 86 %    LYMPHOCYTE % 6 %    MONOCYTE % 7 %    EOSINOPHIL % 0 %    BASOPHIL % 0 %    NEUTROPHIL # 12.21 (H) 1.50 - 7.70 x10^3/uL    LYMPHOCYTE # 0.91 (L) 1.00 - 4.80 x10^3/uL    MONOCYTE # 0.99 0.30 - 1.00 x10^3/uL    EOSINOPHIL # 0.03 0.00 - 0.50 x10^3/uL    BASOPHIL # 0.04 0.00 - 0.20 x10^3/uL   ARTERIAL BLOOD GAS/COOX PANEL/ELECTROLYTES/LACTATE (NA, K, CL, iCA, GLU, LACTATE) -- TEMP COMP   Result Value Ref Range    %FIO2 (ARTERIAL) 94 %    PH (ARTERIAL) 7.41 7.35 - 7.45    PCO2 (ARTERIAL) 37.0 35.0 - 45.0 mm/Hg    PO2 (ARTERIAL) 437.0 (H) 72.0 - 100.0 mm/Hg    BASE DEFICIT 0.9 0.0 - 3.0 mmol/L    BICARBONATE (ARTERIAL) 24.2 18.0 - 26.0 mmol/L    TEMPERATURE, COMP 36.0  15.0 - 40.0 C    PH (T) 7.42 7.35 - 7.45    CO2, TOTAL 35.0 35.0 - 45.0 mm/Hg    (T) PO2 431.0 (H) 72.0 - 100.0 mm/Hg    SODIUM 126 (L) 136 - 145 mmol/L    WHOLE BLOOD POTASSIUM 5.4 (H) 3.5 - 5.0 mmol/L    CHLORIDE 97 96 - 111 mmol/L    IONIZED CALCIUM 1.15 1.10 - 1.30 mmol/L    GLUCOSE 204 (H) 60 - 105 mg/dL    LACTATE 3.0 (H) 0.0 - 1.3 mmol/L    HEMOGLOBIN 9.9 (L) 12.0 - 18.0 g/dL    OXYHEMOGLOBIN 96.9 85.0 - 98.0 %    CARBOXYHEMOGLOBIN 1.7 0.0 - 2.5 %    MET-HEMOGLOBIN 1.3 0.0 -  2.0 %    O2CT 14.7 (L) 15.7 - 24.3 %    PAO2/FIO2 RATIO 465 <=200   POC BLOOD GLUCOSE (RESULTS)   Result Value Ref Range    GLUCOSE, POC 155 (H) 70 - 105 mg/dl   BASIC METABOLIC PANEL   Result Value Ref Range    SODIUM 130 (L) 136 - 145 mmol/L    POTASSIUM 4.4 3.5 - 5.1 mmol/L    CHLORIDE 97 96 - 111 mmol/L    CO2 TOTAL 26 22 - 32 mmol/L    ANION GAP 7 4 - 13 mmol/L    CALCIUM 8.5 8.5 - 10.2 mg/dL    GLUCOSE 594 65 - 090 mg/dL    BUN 33 (H) 8 - 25 mg/dL    CREATININE 5.02 (H) 0.62 - 1.27 mg/dL    BUN/CREA RATIO 22 6 - 22    ESTIMATED GFR 47 (L) >59 mL/min/1.1m^2   CBC   Result Value Ref Range    WBC 10.6 3.5 - 11.0 x10^3/uL    RBC 2.86 (L) 4.06 - 5.63 x10^6/uL    HGB 9.0 (L) 12.5 - 16.3 g/dL    HCT 56.1 (L) 54.8 - 47.0 %    MCV 90.7 78.0 - 100.0 fL    MCH 31.4 27.4 - 33.0 pg    MCHC 34.6 32.5 - 35.8 g/dL    RDW 84.5 (H) 73.3 - 15.0 %    PLATELETS 166 140 - 450 x10^3/uL    MPV 9.1 7.5 - 11.5 fL     66 y.o. Male with ventral hernia now s/p elective ventral hernia repair with mesh (04/21/17)  -monitor abdominal wall  -pain control  -mechanical heart valve   -cont tx Lovenox   -cont warfarin, awaiting therapeutic levels  -regular diet  -DVT ppx (SCDs, tx Lovenox)  -OOBA/tc  -IS    Berdine Dance, MD  04/23/2017, 23:06      I saw and examined the patient.  I reviewed the resident's note.  I agree with the findings and plan of care as documented in the resident's note.  Any exceptions/additions are edited/noted.    Docia Furl, MD

## 2017-04-23 NOTE — Care Plan (Signed)
Pt had a rapid response called early this morning. Pt endorsing some abdominal pain. CT scan ordered. Potentially discharged tomorrow pending pain. Likely will have no needs from care management once discharged.

## 2017-04-23 NOTE — Nurses Notes (Signed)
Patient manual BP 84/62 (69), HR 112, SPO2 98%. Patient denied feeling dizzy or tired while resting in bed. Clarifying patient's orders for PM lovenox and coumadin with service. Patient abdominal dressing intact with minimal drainage present. Text page has been sent to [facility] with surgery service. No new orders at this time. Will continue to monitor patient.

## 2017-04-23 NOTE — Anesthesia Transfer of Care (Signed)
ANESTHESIA TRANSFER OF CARE   Kenneth Weeks is a 67 y.o. ,male, Weight: 121.7 kg (268 lb 4.8 oz)   had Procedure(s):  IRRIGATION AND DEBRIDEMENT ABDOMEN  EVACUATION HEMATOMA ABDOMINAL  performed  04/23/17   Primary Service: Leticia Clas, MD    Past Medical History:   Diagnosis Date   . A-fib (CMS HCC)     Chronic anti-coagulant therapy; Hx cardioversions   . Abdominal hernia     Ventral hernia; Left Inguinal hernia   . Acute on chronic renal failure (CMS HCC)    . Biliary and gallbladder disorder     "Gallstones"   . Blood thinned due to long-term anticoagulant use    . Bruises easily    . Cancer (CMS HCC) 04/09/2017    Colon grade 0 surgery   . Congestive heart failure (CMS HCC) 04/09/2017    CHF 01/2017    . Diabetes mellitus (CMS [place])     type ii diet controlled   . Disorder of liver 04/09/2017    fatty liver   . Dysrhythmias     a fib   . Ejection fraction < 50%    . Heart murmur    . Hemorrhage of gastrointestinal tract 5/16   . Hemorrhagic shock (CMS HCC)    . Hepatic cirrhosis (CMS HCC) 63/89/3734    non alcoholic   . Hx of transfusion 04/09/2017    no reaction   . Hydrocele    . Hypertension 07/10/2014   . Hypothyroid    . Marfan syndrome    . Neck problem 04/09/2017    LROM   . Non-healing non-surgical wound    . Obesity    . Palpitations    . Peripheral vascular disease (CMS [place]) 04/09/2017    no recent problems since starting lasix in 01/2017   . Syncope    . Thyroid disease     hypothyroid   . Transfusion history     s/p Multiple units PRC/FFP this adm.   . Type 2 diabetes mellitus (CMS [place]) 04/09/2017    diet controlled, HA1C 6.0   . Valvular disease 04/09/2017    [place] aortic valve replacement-chronic anti-coagulant therapy   . Wears glasses       Allergy History as of 04/23/17     FEXOFENADINE       Noted Status Severity Type Reaction    07/26/14 1252 CoxDonnalee Curry 07/26/14 Active    Other Adverse Reaction (Add comment)    Comments:  "retain fluid"           ANTIHISTAMINES - ALKYLAMINE        Noted Status Severity Type Reaction    04/09/17 0856 Lucie Leather, RN 04/09/17 Active       Comments:  Pt states antihistamines cause him to retain fluid      04/09/17 0856 Lucie Leather, RN 04/09/17 Active                 I completed my transfer of care / handoff to the receiving personnel during which we discussed:  Access, Airway, All key/critical aspects of case discussed, Analgesia, Antibiotics, Expectation of post procedure, Fluids/Product, Gave opportunity for questions and acknowledgement of understanding, Labs and PMHx    Post Location: PACU  Additional Info:Pt transferred to PACU.  Report given to PACU RN.  VSS. Pt. in stable condition.                       Last OR Temp: Temperature: 36.1 C (97 F)  ABG:  PH   Date Value Ref Range Status   07/13/2014 7.390 7.350 - 7.450 pH Final     PH (ARTERIAL)   Date Value Ref Range Status   04/23/2017 7.41 7.35 - 7.45 Final     PH (T)   Date Value Ref Range Status   04/23/2017 7.42 7.35 - 7.45 Final     PCO2   Date Value Ref Range Status   07/13/2014 37.0 36.2 - 46.2 mm Hg Final     PCO2 (ARTERIAL)   Date Value Ref Range Status   04/23/2017 37.0 35.0 - 45.0 mm/Hg Final     PO2   Date Value Ref Range Status   07/13/2014 106 (HH) 72 - 100 mm Hg Final     PO2 (ARTERIAL)   Date Value Ref Range Status   04/23/2017 437.0 (H) 72.0 - 100.0 mm/Hg Final     SODIUM   Date Value Ref Range Status   04/23/2017 126 (L) 136 - 145 mmol/L Final   07/13/2014 133 (L) 136 - 145 mmol/L Final     POTASSIUM   Date Value Ref Range Status   04/22/2017 4.4 3.5 - 5.1 mmol/L Final   07/26/2014 4.9 3.5 - 5.1 mmol/L Final     KETONES   Date Value Ref Range Status   12/14/2016 Negative Negative mg/dL Final   07/10/2014 5 (A) NEGATIVE mg/dL Final     KETONES,URINE   Date Value Ref Range Status   07/09/2014 NEGATIVE NEGATIVE mg/dL Final     WHOLE BLOOD K+   Date Value Ref Range Status   07/13/2014 3.9 3.5 - 5.0 mmol/L Final     WHOLE BLOOD POTASSIUM   Date  Value Ref Range Status   04/23/2017 5.4 (H) 3.5 - 5.0 mmol/L Final     CHLORIDE   Date Value Ref Range Status   04/23/2017 97 96 - 111 mmol/L Final   07/13/2014 107 96 - 111 mmol/L Final     CALCIUM   Date Value Ref Range Status   04/22/2017 9.1 8.5 - 10.2 mg/dL Final   07/26/2014 9.4 8.5 - 10.4 mg/dL Final     Calculated R Axis   Date Value Ref Range Status   10/23/2015 16 degrees Final     Calculated T Axis   Date Value Ref Range Status   10/23/2015 139 degrees Final     IONIZED CALCIUM   Date Value Ref Range Status   04/23/2017 1.15 1.10 - 1.30 mmol/L Final   07/13/2014 1.14 (L) 1.30 - 1.46 mmol/L Final     GLUCOSE, POINT OF CARE   Date Value Ref Range Status   07/18/2014 123 (H) 70 - 105 mg/dL Final     LACTATE   Date Value Ref Range Status   04/23/2017 3.0 (H) 0.0 - 1.3 mmol/L Final   07/13/2014 0.7 <1.3 mmol/L Final     HEMOGLOBIN   Date Value Ref Range Status   04/23/2017 9.9 (L) 12.0 - 18.0 g/dL Final   07/13/2014 8.9 (L) 14.0 - 18.0 g/dL Final     OXYHEMOGLOBIN   Date Value Ref Range Status   04/23/2017 96.9 85.0 - 98.0 % Final   07/13/2014 96.0 85.0 -  98.0 % Final     CARBOXYHEMOGLOBIN   Date Value Ref Range Status   04/23/2017 1.7 0.0 - 2.5 % Final   07/13/2014 1.8 0.0 - 2.5 % Final     MET-HEMOGLOBIN   Date Value Ref Range Status   04/23/2017 1.3 0.0 - 2.0 % Final   07/13/2014 1.2 0.0 - 3.0 % Final     BASE EXCESS   Date Value Ref Range Status   07/13/2014 Test Not Performed 0.0 - 3.0 mmol/L Final     BASE DEFICIT   Date Value Ref Range Status   04/23/2017 0.9 0.0 - 3.0 mmol/L Final   07/13/2014 2.3 0.0 - 3.0 mmol/L Final     BICARBONATE   Date Value Ref Range Status   07/13/2014 23.1 20.0 - 29.0 mmol/L Final     BICARBONATE (ARTERIAL)   Date Value Ref Range Status   04/23/2017 24.2 18.0 - 26.0 mmol/L Final     TEMPERATURE, COMP   Date Value Ref Range Status   04/23/2017 36.0 15.0 - 40.0 C Final   07/13/2014 36.0 15.0 - 40.0 C Final     %FIO2   Date Value Ref Range Status   07/13/2014 58 21 - 100 % Final      Airway:  EndoTracheal Tube (Active)     Blood pressure 103/70, pulse (!) 101, temperature 36.1 C (97 F), resp. rate 18, height 1.905 m (_0 ), weight 121.7 kg (268 lb 4.8 oz), SpO2 99 %.

## 2017-04-23 NOTE — Nurses Notes (Signed)
67 year old male pt arrived to 53 s/p I&D of hernia repair.  Pt alert and oriented.  Vitals and assessment per flowsheets.  Abdominal binder in place; dressing is clean, dry, and intact.  Service paged regarding arterial line.  Plan is to maintain art line at this time.  Wife at bedside.  Call bell in reach.  Will monitor.

## 2017-04-23 NOTE — Ancillary Notes (Signed)
Diabetes Education    Stopped to offer diabetes education. The patient states that he has had diabetes for about 3-4 years. The patient states that he no longer takes medications for his diabetes but is managed by his diet.  The patient stated that he tests 1 time a day and has a meter. The patient states that he ranges in the 100-130 mg/dL. Current A1c   Lab Results   Component Value Date    HA1C 6.0 03/23/2017    HA1C 5.5 07/10/2014     Reviewed this value and meaning with patient. Discussed ADA targets. The patient denies issues with obtaining testing supplies or medications. We reviewed hypoglycemia. We reviewed progression of disease, ADA BG targets and the importance of testing and avoidance of complications by managing blood sugars. The patient shared that he follows with Viona Gilmore, MD for diabetes. Answered all questions. Verbalized understanding. Contact information offered.    Hillery Hunter BSN, Therapist, sports.   [facility]  Ext: [phone]  Pager: [phone]

## 2017-04-23 NOTE — Code Documentation (Signed)
ADULT RAPID RESPONSE NOTE    RRT ALERT: Yes    RRT ALERT: Yes Time RRT Arrived: 0527 Source of Request: MEWS: Acute Change   RRT APP Present: No RT Present: Yes      MEWS Score     MEWS SCORE: 5    Primary Team Notification                  Patient's Primary Team Present: No response required     Adult Rapid Response Team Event  RRT Events: No Intervention     Primary Concerns  Primary Concerns: Dysrhythmia         Secondary Concerns  Secondary Concerns: Other   Comment: Low Temp documented     Subject Type: Patient    Inital Assessement: MEWS change from 1 to 5.    Assessment: Pt in OR.   Intervention: Pt in OR.      Adult Rapid Response Team - Time Out     Time RRT Departed: 0530

## 2017-04-23 NOTE — Nurses Notes (Signed)
Patient left unit for PACU via bed transport.

## 2017-04-23 NOTE — Care Plan (Signed)
Patient alert and oriented. Denies pain at current time. Patient instructed to call out if he feels pain in order to stay on top of surgical pain. Abdominal binder and surgical dressing- clean, dry, intact. Arterial line removed from right wrist- patient tolerated well and pressure dressing is intact. Monitoring labs, vitals, and pain control.   Problem: Adult Inpatient Plan of Care  Goal: Plan of Care Review  Outcome: Ongoing (see interventions/notes)  Flowsheets (Taken 04/23/2017 0515 by Steva Ready, RN)  Plan of Care Reviewed With: patient  Goal: Patient-Specific Goal (Individualization)  Outcome: Ongoing (see interventions/notes)  Flowsheets (Taken 04/23/2017 1830)  Individualized Care Needs: Patient goes by "Kenneth Weeks"  Anxieties, Fears or Concerns: none at current time  Patient-Specific Goals (Include Timeframe): prevent abdominal distension and pain - wants to get better and go home soon  Goal: Absence of Hospital-Acquired Illness or Injury  Outcome: Ongoing (see interventions/notes)  Goal: Optimal Comfort and Wellbeing  Outcome: Ongoing (see interventions/notes)  Goal: Rounds/Family Conference  Outcome: Ongoing (see interventions/notes)     Problem: Fall Injury Risk  Goal: Absence of Fall and Fall-Related Injury  Outcome: Ongoing (see interventions/notes)     Problem: Pain Acute  Goal: Optimal Pain Control  Outcome: Ongoing (see interventions/notes)

## 2017-04-23 NOTE — OR Nursing (Signed)
X3 Combat gauzes and X1 kerlix gauze  Packed into the patient's abdomen covered with exterior ABD pads and medipore tape per Dr. Mel Almond intra-op.

## 2017-04-23 NOTE — OR PreOp (Signed)
PT is AA&O.  VSS.  Wife is waiting in room.

## 2017-04-23 NOTE — Care Management Notes (Signed)
[facility]  Care Management Note    Patient Name: Kenneth Weeks  Date of Birth: 08/08/1950  Sex: male  Date/Time of Admission: 04/21/2017 10:13 AM  Room/Bed: 817/A  Payor: MEDICARE / Plan: MEDICARE PART A AND B / Product Type: Medicare /    LOS: 2 days   Primary Care Providers:  Viona Gilmore, MD, MD (General)  Viona Gilmore, MD, MD (Claims Attributed)    Admitting Diagnosis:  Ventral hernia [K43.9]    Assessment:      04/23/17 0806   Patient Hand-Off   Clinical/Discharge Plan of Care Information Communicated to:  Medical Social Worker;Clinical Care Coordinator   Comments   (Handoff message left for MSW Owens Shark, also spoke to her [facility] partner on 8W, [place])     Found patient moved to 8W this am. Handoff given as above. In particular. Made 8W CM Staff aware patient has no Rx coverage and had planned to fill lovenox with discount card (generic version of medication).     Discharge Plan:  Home (Patient/Family Member/other) (code 1)      The patient will continue to be evaluated for developing discharge needs.     Case Manager: Truett Perna, MSW  Phone: [phone]

## 2017-04-23 NOTE — Care Management Notes (Signed)
[facility]  Care Management Note    Patient Name: Kenneth Weeks  Date of Birth: August 25, 1950  Sex: male  Date/Time of Admission: 04/21/2017 10:13 AM  Room/Bed: 817/A  Payor: MEDICARE / Plan: MEDICARE PART A AND B / Product Type: Medicare /    LOS: 2 days   Primary Care Providers:  Viona Gilmore, MD, MD (General)  Viona Gilmore, MD, MD (Claims Attributed)    Admitting Diagnosis:  Ventral hernia [K43.9]    Assessment:      04/23/17 1503   Assessment Details   Assessment Type Continued Assessment   Date of Care Management Update 04/23/17   Date of Next DCP Update 04/26/17   Care Management Plan   Discharge Planning Status plan in progress   Projected Discharge Date 04/24/17   Discharge Needs Assessment   Discharge Facility/Level of Care Needs Home (Patient/Family Member/other)(code 1)     Pt had a rapid response called early this morning. Pt endorsing some abdominal pain. CT scan ordered. Potentially discharged tomorrow pending pain. Likely will have no needs from care management once discharged.     Discharge Plan:  Home (Patient/Family Member/other) (code 1)      The patient will continue to be evaluated for developing discharge needs.     Case Manager: Leo Rod, [place]  Phone: [phone]

## 2017-04-23 NOTE — Anesthesia Preprocedure Evaluation (Addendum)
ANESTHESIA PRE-OP EVALUATION  Planned Procedure: IRRIGATION AND DEBRIDEMENT ABDOMEN (N/A Abdomen)  Review of Systems     anesthesia history negative     patient summary reviewed          Pulmonary     Cardiovascular    Hypertension, valvular problems/murmurs, murmur, dysrhythmias and atrial fibrillation No peripheral edema,  Exercise Tolerance: > or = 4 METS        GI/Hepatic/Renal    liver disease     Endo/Other    hypothyroidism, obesity, chronic transfusion therapy and drug induced coagulopathy,       Neuro/Psych/MS        Cancer                     Physical Assessment      Patient summary reviewed   Airway       Mallampati: III    TM distance: >3 FB    Neck ROM: limited  Mouth Opening: good.  No Facial hair  No Beard  No endotracheal tube present  No Tracheostomy present    Dental           (+) missing, caps        Comment: Missing upper left and cap lower left         Pulmonary           Cardiovascular    Comment: valve click  Rhythm: irregular    (+) murmur present   (-) no friction rub and no peripheral edema     Other findings            Plan  Planned anesthesia type: general    ASA 4 - emergent         Anesthetic plan and risks discussed with patient.     Anesthesia issues/risks discussed are: Post-op Pain Management, Failure of Block, Sore Throat, Nerve Injuries, PONV, Cardiac Events/MI, Stroke, Post-op Agitation/Tantrum, Blood Loss, Difficult Airway, Aspiration, Art Line Placement and Dental Injuries.    Use of blood products discussed with patient whom consented to blood products.     Patient's NPO status is appropriate for Anesthesia.           Plan discussed with resident and attending.    (Here for emergent I/D of the abdomen.  Agreeable and consented for GA.  Plan for RSI, +/- Aline.  Will have CMAC in room.      Roderic Palau A. Josephine Rudnick, DO, MPH 04/23/2017, 05:19  PGY-2/CA-1 Department of Anesthesiology  Pager [phone]  )             EKG 01/20/2017 on chart and in media  CXR: Not ordered   CT chest  03/22/2017  CHEST:  LUNGS/PLEURA: No focal consolidation, pleural effusion, or pneumothorax is  identified. Numerous tiny bilateral pulmonary nodules measuring up to 3 mm  are stable from multiple prior studies. No new suspicious pulmonary nodules  are identified. The central airways are patent.    Other Studies: Labs 03/24/2017    01/20/2017 Lexiscan-  Interpretation: No ST changes diagnostic for ischemia. Perfusion images pending  Nuclear Stress Test 01/20/17    1.The patient had decreased perfusion both at rest and stress in the inferior lateral wall and this was considered a mild area of moderate intensity, consistent with diaphragmatic attenuation artifact. There was no evidence of ischemia noted.   2.The patient had septal akinesis and mild global hypokinesis noted on gated wall motion analysis with an ejection fraction moderately decreased at  36%.  3.The patient was noted to have a dilated left ventricle, both at rest and stress.        12/14/2016 Echo  Conclusions:  1. Mildly dilated left ventricle. Mildly depressed left ventricular systolic function. LV Ejection Fraction is 45  %. Normal geometry. Left ventricular diastolic function could not be assessed due to the presence of  atrial fibrillation during the study.  2. Resting Segmental Wall Motion Analysis: Total wall motion score is 2.00. There is global left ventricular  hypokinesis.  3. Moderately dilated right ventricle. Mildly depressed right ventricular systolic function. RV systolic  pressure is consistent with at least mild-to-moderate pulmonary hypertension (TR peak gradient 41  mmHg). RA pressure could not be assessed as the IVC is not well visualized.  4. Severely dilated left atrium.  5. Moderately dilated right atrium.  6. There is mild primary mitral valve regurgitation.  7. A mechanical prosthetic valve is present in the aortic position. The aortic valve prosthesis appears well  seated. The aortic prosthesis demonstrates a moderately  increased transvalvular gradient for valve type  and size. This is suggestive of moderate prosthetic aortic stenosis.      03/15/2017 Last Cardiology note:    Kenneth Weeks is a 67 y.o. male with    AS:s/p [place] AVR in 1999. 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  - follow with Echos,will re-echo after HR better controlled for better AV assessment  -on Coumadin with goal INR 2.5-3.5; last INR 2.37 02/24/17  - not on aspirin given h/o GI bleed    Cardiomyopathy/Diastolic CHF:EF 60-10% on 07/11/14 Echo and now EF 45%, mildly dilated LV, and global LV hypokinesis on 12/14/16 Echo.Nuc. Stress test 01/20/17- No evidence of ischemia noted, septal akinesis and mild global hypokinesis with an EF of 36%, and a dilated LV, bot at rest and stress.  - intolerant to Spironolactone(gynecomastia)  - continue on Toprol-XL; increase to 200 mg daily at next refill (he is to call with bottle info as formulation may have changed per his description)  - continue on Lisinopril  - continue on Eplerenone  - continue Lasix    A-Fib:Hx of DCCV. Echo 12/14/16- Presence of A-Fib, severely dilated LA, and moderately dilated RA.  - no SOB/DOE   - HR today is 98   - CHA2DS2-VASc score of 3 (age + HTN + CMP/CHF)  - after 5 years of A Fib it is not likely that we will be able to successfully rhythm control him  - continue Coumadin for stroke prophylaxis  - continue Toprol for rate control; after he finishes current bottle will increase to 200 mg for better HR control as above    Pulmonary XNA:[id number] dilated RV and mild-to-moderate pulmonary HTN on 12/14/16 Echo.  - c/o SOB/DOE    KYH:CWCB controlled   -   Lipids:FLP 07/10/14- Total 74, Trig. 115, HDL 18, LDL 33  - not on statin therapy  - encouraged heart healthy diet and exercise    ?JSE:GBTDVVOHY snoring and apneic events  - consider future sleep study    Nonsmoker    Hx ofMarfan's Syndrome(?):  - unclear how  diagnosed, he does not have typical features, no h/o ectopia lentis, aortic root normal in size on recent echo    Follow up in 3 months    Thank you for allowing me to participate in the care of your patient. Please feel free to contact me if there are further questions.  I am scribing for, and in the presence of Dr. Barnet Glasgow, MDfor servicesprovided on 03/15/2017.  Debbora Dus, [place], [place]  03/15/2017, 16:20    I personally performed the services described in this documentation, as scribedin my presence, and it is both accurateand complete.    Barnet Glasgow, MD    Barnet Glasgow, MD  03/15/2017, 16:29

## 2017-04-23 NOTE — Brief Op Note (Signed)
[facility]                                                     BRIEF OPERATIVE NOTE    Patient Name: Rudolf, Blizard [facility] Number: [id number]  Date of Service: 04/23/2017   Date of Birth: [birth year]    All elements must be documented.    Pre-Operative Diagnosis:    1)  Incisional ventral hernia s/p ventral hernia repair with Prolene mesh  2)  Marfan's syndrome  3)  Acute abdominal wall hematoma      Post-Operative Diagnosis:   1)  Incisional ventral hernia s/p ventral hernia repair with Prolene mesh  2)  Marfan's syndrome  3)  Acute abdominal wall hematoma     Procedure(s)/Description:    1)  Drainage of hematoma and seroma in anterior abdominal wall  2)  Explantation of Prolene mesh    Findings/Complexity (inherent to the procedure performed):  1500 cc of seroma and 400 cc of hematoma evacuated, bleeding dilated vein noted which was suture ligated, hematoma cavity packed with 3 combat gauze and 1 Kerlix     Attending Surgeon: Mel Almond  Assistant(s): Albertine Grates    Anesthesia Type: General  Estimated Blood Loss:  400 cc hematoma, 100 cc fresh blood  Blood Given:  None  Fluids Given:  1500 cc crystalloid   Complications (not routinely expected or not inherent to difficulty/nature of procedure):  None  Characteristic Event (routinely expected or inherent to the difficulty/nature of the procedure):  None  Did the use of current and/or prior Anticoagulants impact the outcome of the case? N\A  Wound Class: Clean Wound: Uninfected operative wounds in which no inflammation occurred    Tubes: None  Drains: None  Specimens/ Cultures: None  Implants: None           Disposition: PACU - hemodynamically stable  Condition: Stable      Doyne Keel, MD    I was present, scrubbed for, and participated in all portions of the case.    Avelina Laine, MD

## 2017-04-24 LAB — CBC WITH DIFF
BASOPHIL #: 0.05 x10ˆ3/uL (ref 0.00–0.20)
BASOPHIL %: 1 %
EOSINOPHIL #: 0.15 x10ˆ3/uL (ref 0.00–0.50)
HCT: 22.9 % — ABNORMAL LOW (ref 36.7–47.0)
HGB: 7.9 g/dL — ABNORMAL LOW (ref 12.5–16.3)
LYMPHOCYTE #: 1.02 x10ˆ3/uL (ref 1.00–4.80)
LYMPHOCYTE %: 12 %
MCH: 31.4 pg (ref 27.4–33.0)
MCHC: 34.6 g/dL (ref 32.5–35.8)
MCV: 90.9 fL (ref 78.0–100.0)
MONOCYTE #: 0.87 x10ˆ3/uL (ref 0.30–1.00)
MPV: 9 fL (ref 7.5–11.5)
MPV: 9 fL (ref 7.5–11.5)
NEUTROPHIL #: 6.58 x10ˆ3/uL (ref 1.50–7.70)
NEUTROPHIL %: 76 %
PLATELETS: 153 x10ˆ3/uL (ref 140–450)
RBC: 2.52 x10ˆ6/uL — ABNORMAL LOW (ref 4.06–5.63)
RDW: 16.6 % — ABNORMAL HIGH (ref 12.0–15.0)
WBC: 8.7 x10ˆ3/uL (ref 3.5–11.0)

## 2017-04-24 LAB — POC BLOOD GLUCOSE (RESULTS): GLUCOSE, POC: 144 mg/dl — ABNORMAL HIGH (ref 70–105)

## 2017-04-24 LAB — BASIC METABOLIC PANEL
ANION GAP: 5 mmol/L (ref 4–13)
BUN/CREA RATIO: 25 — ABNORMAL HIGH (ref 6–22)
BUN: 28 mg/dL — ABNORMAL HIGH (ref 8–25)
CALCIUM: 8.1 mg/dL — ABNORMAL LOW (ref 8.5–10.2)
CHLORIDE: 97 mmol/L (ref 96–111)
CO2 TOTAL: 26 mmol/L (ref 22–32)
CREATININE: 1.14 mg/dL (ref 0.62–1.27)
ESTIMATED GFR: >59 mL/min/1.73mˆ2 (ref >59)
GLUCOSE: 134 mg/dL (ref 65–139)
POTASSIUM: 4.2 mmol/L (ref 3.5–5.1)
SODIUM: 128 mmol/L — ABNORMAL LOW (ref 136–145)

## 2017-04-24 LAB — PT/INR
INR: 2.09 — ABNORMAL HIGH (ref 0.80–1.20)
PROTHROMBIN TIME: 24.1 seconds — ABNORMAL HIGH (ref 9.5–14.1)

## 2017-04-24 LAB — MAGNESIUM: MAGNESIUM: 2 mg/dL (ref 1.6–2.5)

## 2017-04-24 LAB — PHOSPHORUS: PHOSPHORUS: 3.3 mg/dL (ref 2.3–4.0)

## 2017-04-24 MED ORDER — WARFARIN 7.5 MG TABLET
7.5000 mg | ORAL_TABLET | Freq: Every evening | ORAL | Status: DC
Start: 2017-04-24 — End: 2017-04-25
  Administered 2017-04-24: 7.5 mg via ORAL
  Filled 2017-04-24 (×2): qty 1

## 2017-04-24 MED ORDER — ENOXAPARIN 120 MG/0.8 ML SUBCUTANEOUS SYRINGE
1.0000 mg/kg | INJECTION | Freq: Two times a day (BID) | SUBCUTANEOUS | Status: DC
Start: 2017-04-24 — End: 2017-04-30
  Administered 2017-04-24 – 2017-04-30 (×13): 120 mg via SUBCUTANEOUS
  Filled 2017-04-24 (×15): qty 0.8

## 2017-04-24 MED ADMIN — lactated Ringers intravenous solution: ORAL | @ 10:00:00 | NDC 00264775000

## 2017-04-24 NOTE — Progress Notes (Signed)
67 y.o. Male with ventral hernia now s/p elective ventral hernia repair with mesh (04/21/17), take back to OR for evacuation of hematoma explantation of prolene mesh 04/23/2017.    Hypotensive to SBP 84 overnight, responsive to fluid bolus. Improved this morning, tolerating clears.    Filed Vitals:    04/23/17 2127 04/23/17 2348 04/24/17 0439 04/24/17 0946   BP: (!) 84/62 95/62 111/63 (!) 103/58   Pulse:  93 (!) 117 (!) 109   Resp:  (!) 21 (!) 23    Temp:  36.8 C (98.3 F) 36.8 C (98.2 F)    SpO2:  93% 96%      Gen- NAD, lethargic  CV- RRR  Pulm- non-labored  Abd- packing removed, wound base clean and dry  Ext- no cyanosis or edema    I have reviewed all lab results.  Lab Results Today:    Results for orders placed or performed during the hospital encounter of 04/21/17 (from the past 24 hour(s))   POC BLOOD GLUCOSE (RESULTS)   Result Value Ref Range    GLUCOSE, POC 155 (H) 70 - 105 mg/dl   BASIC METABOLIC PANEL   Result Value Ref Range    SODIUM 130 (L) 136 - 145 mmol/L    POTASSIUM 4.4 3.5 - 5.1 mmol/L    CHLORIDE 97 96 - 111 mmol/L    CO2 TOTAL 26 22 - 32 mmol/L    ANION GAP 7 4 - 13 mmol/L    CALCIUM 8.5 8.5 - 10.2 mg/dL    GLUCOSE 138 65 - 139 mg/dL    BUN 33 (H) 8 - 25 mg/dL    CREATININE 1.50 (H) 0.62 - 1.27 mg/dL    BUN/CREA RATIO 22 6 - 22    ESTIMATED GFR 47 (L) >59 mL/min/1.77m^2   CBC   Result Value Ref Range    WBC 10.6 3.5 - 11.0 x10^3/uL    RBC 2.86 (L) 4.06 - 5.63 x10^6/uL    HGB 9.0 (L) 12.5 - 16.3 g/dL    HCT 26.0 (L) 36.7 - 47.0 %    MCV 90.7 78.0 - 100.0 fL    MCH 31.4 27.4 - 33.0 pg    MCHC 34.6 32.5 - 35.8 g/dL    RDW 16.7 (H) 12.0 - 15.0 %    PLATELETS 166 140 - 450 x10^3/uL    MPV 9.1 7.5 - 11.5 fL   BASIC METABOLIC PANEL   Result Value Ref Range    SODIUM 128 (L) 136 - 145 mmol/L    POTASSIUM 4.2 3.5 - 5.1 mmol/L    CHLORIDE 97 96 - 111 mmol/L    CO2 TOTAL 26 22 - 32 mmol/L    ANION GAP 5 4 - 13 mmol/L    CALCIUM 8.1 (L) 8.5 - 10.2 mg/dL    GLUCOSE 134 65 - 139 mg/dL    BUN 28 (H) 8 - 25  mg/dL    CREATININE 1.14 0.62 - 1.27 mg/dL    BUN/CREA RATIO 25 (H) 6 - 22    ESTIMATED GFR >59 >59 mL/min/1.48m^2   MAGNESIUM   Result Value Ref Range    MAGNESIUM 2.0 1.6 - 2.5 mg/dL   PHOSPHORUS   Result Value Ref Range    PHOSPHORUS 3.3 2.3 - 4.0 mg/dL   CBC WITH DIFF   Result Value Ref Range    WBC 8.7 3.5 - 11.0 x10^3/uL    RBC 2.52 (L) 4.06 - 5.63 x10^6/uL    HGB 7.9 (L) 12.5 -  16.3 g/dL    HCT 22.9 (L) 36.7 - 47.0 %    MCV 90.9 78.0 - 100.0 fL    MCH 31.4 27.4 - 33.0 pg    MCHC 34.6 32.5 - 35.8 g/dL    RDW 16.6 (H) 12.0 - 15.0 %    PLATELETS 153 140 - 450 x10^3/uL    MPV 9.0 7.5 - 11.5 fL    NEUTROPHIL % 76 %    LYMPHOCYTE % 12 %    MONOCYTE % 10 %    EOSINOPHIL % 2 %    BASOPHIL % 1 %    NEUTROPHIL # 6.58 1.50 - 7.70 x10^3/uL    LYMPHOCYTE # 1.02 1.00 - 4.80 x10^3/uL    MONOCYTE # 0.87 0.30 - 1.00 x10^3/uL    EOSINOPHIL # 0.15 0.00 - 0.50 x10^3/uL    BASOPHIL # 0.05 0.00 - 0.20 x10^3/uL   POC BLOOD GLUCOSE (RESULTS)   Result Value Ref Range    GLUCOSE, POC 144 (H) 70 - 105 mg/dl   PT/INR   Result Value Ref Range    PROTHROMBIN TIME 24.1 (H) 9.5 - 14.1 seconds    INR 2.09 (H) 0.80 - 1.20     67 y.o. Male with ventral hernia now s/p elective ventral hernia repair with mesh (04/21/17), s/p takeback for evacuation of hematoma and explantation of prolene mesh.    -regular diet  -pain control  -mechanical heart valve   -cont tx Lovenox   -cont warfarin, awaiting therapeutic levels   - daily INR and CBC  -regular diet  - Wet to dry dressings  -OOBA/tc  -IS      Gerilyn Nestle, MD      I saw and examined the patient.  I reviewed the resident's note.  I agree with the findings and plan of care as documented in the resident's note.  Any exceptions/additions are edited/noted.    Continuing to follow H&H, some hypotension.  Responsive to fluids.  Back on Coumadin with Lovenox bridge.      Leticia Clas, MD

## 2017-04-24 NOTE — Care Plan (Signed)
Pt is alert and oriented X4, VS monitored closely, and assessments per flow sheets. No complaints of pain or discomfort at this time. Pt states he has "a lot of anxiety" about his wound, service was at bedside and spoke with pt about it. Wife at bedside. Wound to abdomen draining serosanguinous drainage and abdominal binder in place, wound dressing re-enforced as needed per orders. Bed alarm refused, call light within reach, will continue to monitor. Crawford Givens, RN  04/24/2017, 14:34   Problem: Adult Inpatient Plan of Care  Goal: Plan of Care Review  Outcome: Ongoing (see interventions/notes)  Goal: Patient-Specific Goal (Individualization)  Outcome: Ongoing (see interventions/notes)  Flowsheets (Taken 04/24/2017 1431)  Individualized Care Needs: likes to be called "Rick"  Anxieties, Fears or Concerns: "my abdominal wound"  Patient-Specific Goals (Include Timeframe): Pain control  Goal: Absence of Hospital-Acquired Illness or Injury  Outcome: Ongoing (see interventions/notes)  Goal: Optimal Comfort and Wellbeing  Outcome: Ongoing (see interventions/notes)  Goal: Rounds/Family Conference  Outcome: Ongoing (see interventions/notes)     Problem: Fall Injury Risk  Goal: Absence of Fall and Fall-Related Injury  Outcome: Ongoing (see interventions/notes)     Problem: Pain Acute  Goal: Optimal Pain Control  Outcome: Ongoing (see interventions/notes)

## 2017-04-24 NOTE — OR Surgeon (Signed)
[facility] SUMMARY    Patient Name: Kenneth Weeks  Patient MRN: [id number]  Patient DOB: 1950-07-01  Date of Service:  04/23/2017    PREOPERATIVE DIAGNOSIS:    1)  Incisional ventral hernia status post primary fascial repair with Prolene overlay mesh placement  2)  Marfan's syndrome  3)  Acute abdominal wall hematoma    POSTOPERATIVE DIAGNOSIS:     1)  Incisional ventral hernia status post primary fascial repair with Prolene overlay mesh placement  2)  Marfan's syndrome  3)  Acute abdominal wall hematoma with active bleeding    NAME OF PROCEDURE:     1)  Drainage of hematoma and seroma in anterior abdominal wall   2)  Explantation of Prolene mesh    ATTENDING SURGEON:  Avelina Laine, MD     ASSISTANT(S):  Assunta Curtis, MD     ANESTHESIA:  General endotracheal anesthesia    ESTIMATED BLOOD LOSS:  400 cc of hematoma and 100 cc of fresh blood     FLUIDS:  1500 cc crystalloid     COMPLICATIONS:  None    OPERATIVE FINDINGS:   1500 cc of seroma and 400 cc of hematoma were evacuated, bleeding dilated vein noted which was suture ligated, hematoma cavity packed with 3 combat gauze and 1 Kerlix     INDICATIONS FOR PROCEDURE:  This is a pleasant 67 year old gentleman who developed an incisional ventral hernia secondary to a 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  He initially underwent an elective incisional ventral hernia repair with onlay Prolene mesh a couple of days ago.  Postoperatively, the patient had been getting anticoagulation for his heart valve.  This evening, the patient developed acutely worsening abdominal pain and was noted on exam to have an acutely distended abdomen. A CT abdomen and pelvis was obtained which revealed a large abdominal wall hematoma with active extravasation.  Informed consent was obtained and the patient was taken back to the OR for a wound exploration.    DESCRIPTION OF PROCEDURE:  The  patient was taken to the OR and placed supine on the OR table. Appropriate monitors were attached.  General endotracheal anesthesia was induced. Preoperative antibiotics were given for Ancef.  An appropriate time-out was performed. The abdomen was prepped and draped in the usual sterile fashion. The pre-existing skin staples were removed. The wound was entered and upon entry a large amount of seroma fluid started pouring out.  The seroma fluid was suctioned out as completely as possible ultimately yielding about 1500 cc.  Upon further exploration of the wound, fresh hematoma was noted with a total of about 400 cc.  The wound was noted to be tunneling into both flanks.  The hematoma was scraped off as much as possible.  We did note at this point that there was a source of bleeding emanating from the right tunneled portion of the wound.  Upon further inspection, we identified a dilated vein which was actively bleeding which was suture ligated with 3-0 silk in a figure-of-eight fashion. Subsequently, hemostasis was achieved. Upon further irrigating the wound, we found no other evidence of active bleeding. The decision was made to take the Prolene mesh out as it would be a potential nidus for infection, so the Ethibond sutures which had been holding it in place were cut.  The wound was irrigated one last time and again no evidence of active bleeding was found.  We packed the wound with 3 rolls of combat gauze 1 on each side and 1 in the middle followed by a roll of Kerlix in the middle.  A dry dressing was applied and the procedure was terminated.  Sponge, instrument, needle counts were correct per the OR staff.  The patient was extubated and taken to PACU in stable condition.    Dr. Mel Almond was scrubbed for the entirety of the case.    Doyne Keel, MD 04/24/2017, 23:38    I was present, scrubbed for, and participated in all portions of the case.    Avelina Laine, MD

## 2017-04-24 NOTE — Nurses Notes (Signed)
Patient BP after 1 L NS administered was 95/62 (73) and HR 116. Patient has small/moderate bloody drainage present of abdominal dressing that was not present earlier. Patient abdomen tender and firm. Patient denied need for pain medications at this time. Patient did not receive PM lovenox or coumadin. MD discontinued those medications this evening. Patient MIVF of LR at 100 mL/hr has been started. Text page has been sent to [facility] with general surgery blue. No orders obtained at this time. Will continue to monitor patient.

## 2017-04-24 NOTE — Nurses Notes (Signed)
Results for Kenneth Weeks, Kenneth Weeks (MRN [id number]) as of 04/24/2017 07:02   Ref. Range 04/24/2017 05:52   SODIUM Latest Ref Range: 136 - 145 mmol/L 128 (L)     Text page has been sent to [facility] in regards to patient's lab results. Will continue to monitor patient.

## 2017-04-24 NOTE — Progress Notes (Signed)
Surgery Note  Seen and examined. Patient with recent ventral hernia repair with mesh with acute abdominal wall hematoma s/p drainage of hematoma and ligation of bleeding vein. Overnight with some hypotension and AKI, most likely prerenal from blood loss. Vitals improved with fluid resuscitation. Abdomen soft. Dressings minimal. Patient states urine dark coloured still. AM labs ordered, will monitor H/H given Hgb drop and need for anticoagulation with mechanical valve. Monitor abdominal exam.     Nelma Rothman, MD      I saw and examined the patient.  I reviewed the resident's note.  I agree with the findings and plan of care as documented in the resident's note.  Any exceptions/additions are edited/noted.    Leticia Clas, MD

## 2017-04-25 LAB — BASIC METABOLIC PANEL
ANION GAP: 6 mmol/L (ref 4–13)
BUN/CREA RATIO: 23 — ABNORMAL HIGH (ref 6–22)
BUN: 19 mg/dL (ref 8–25)
CALCIUM: 8.1 mg/dL — ABNORMAL LOW (ref 8.5–10.2)
CHLORIDE: 96 mmol/L (ref 96–111)
CO2 TOTAL: 26 mmol/L (ref 22–32)
CREATININE: 0.84 mg/dL (ref 0.62–1.27)
ESTIMATED GFR: >59 mL/min/1.73mˆ2 (ref >59)
GLUCOSE: 132 mg/dL (ref 65–139)
POTASSIUM: 4.2 mmol/L (ref 3.5–5.1)
SODIUM: 128 mmol/L — ABNORMAL LOW (ref 136–145)

## 2017-04-25 LAB — CBC WITH DIFF
BASOPHIL #: 0.05 x10ˆ3/uL (ref 0.00–0.20)
EOSINOPHIL #: 0.19 x10ˆ3/uL (ref 0.00–0.50)
HCT: 21.4 % — ABNORMAL LOW (ref 36.7–47.0)
HGB: 7.4 g/dL — ABNORMAL LOW (ref 12.5–16.3)
LYMPHOCYTE #: 1.12 x10ˆ3/uL (ref 1.00–4.80)
LYMPHOCYTE %: 14 %
MCH: 31.4 pg (ref 27.4–33.0)
MCHC: 34.4 g/dL (ref 32.5–35.8)
MCV: 91.3 fL (ref 78.0–100.0)
MONOCYTE #: 0.73 x10ˆ3/uL (ref 0.30–1.00)
MPV: 8.7 fL (ref 7.5–11.5)
NEUTROPHIL #: 6.01 x10ˆ3/uL (ref 1.50–7.70)
PLATELETS: 173 x10ˆ3/uL (ref 140–450)
RBC: 2.35 x10ˆ6/uL — ABNORMAL LOW (ref 4.06–5.63)
RDW: 16.7 % — ABNORMAL HIGH (ref 12.0–15.0)
WBC: 8.1 x10ˆ3/uL (ref 3.5–11.0)

## 2017-04-25 LAB — POC BLOOD GLUCOSE (RESULTS): GLUCOSE, POC: 140 mg/dl — ABNORMAL HIGH (ref 70–105)

## 2017-04-25 LAB — H & H
HCT: 23.5 % — ABNORMAL LOW (ref 36.7–47.0)
HGB: 7.8 g/dL — ABNORMAL LOW (ref 12.5–16.3)

## 2017-04-25 MED ORDER — HYDROMORPHONE 2 MG/ML INJECTION SYRINGE
0.4000 mg | INJECTION | Freq: Two times a day (BID) | INTRAMUSCULAR | Status: DC | PRN
Start: 2017-04-25 — End: 2017-04-30
  Administered 2017-04-25 – 2017-04-28 (×4): 0.4 mg via INTRAVENOUS
  Filled 2017-04-25 (×6): qty 1

## 2017-04-25 MED ORDER — GELATIN MATRIX SEALANT (FLOSEAL) 10 ML KIT
PACK | Freq: Two times a day (BID) | CUTANEOUS | Status: DC
Start: 2017-04-25 — End: 2017-04-25
  Filled 2017-04-25 (×2): qty 1

## 2017-04-25 MED ORDER — SODIUM CHLORIDE 0.9 % IV BOLUS
40.00 mL | INJECTION | Freq: Once | Status: AC | PRN
Start: 2017-04-25 — End: 2017-04-25
  Administered 2017-04-25: 0 mL via INTRAVENOUS
  Administered 2017-04-25: 40 mL via INTRAVENOUS

## 2017-04-25 MED ADMIN — sodium chloride 0.9 % (flush) injection syringe: ORAL | @ 08:00:00

## 2017-04-25 MED ADMIN — nystatin 100,000 unit/gram topical powder: SUBCUTANEOUS | @ 01:00:00 | NDC 00574200815

## 2017-04-25 NOTE — Progress Notes (Signed)
Dressing Change Progress Note:    Patient's dressing changed at bedside.  His wound bed does not show evidence of active bleeding on either side or in the middle.  His soaked dressings are likely from a general ooze due to his anticoagulation.  A hemostatic lap pad was packed in the wound and covered with a dry gauze.  His skin edges were brought as close as possible together and secured with tape to decrease the volume of his wound and provide further hemostasis.        Doyne Keel, MD  04/25/2017, 18:23

## 2017-04-25 NOTE — Progress Notes (Signed)
67 y.o. Male with ventral hernia now s/p elective ventral hernia repair with mesh (04/21/17), take back to OR for evacuation of hematoma explantation of prolene mesh 04/23/2017.    Dressing saturated overnight, changed completely, then saturated again this morning.    Filed Vitals:    04/25/17 0424 04/25/17 0425 04/25/17 0722 04/25/17 0728   BP: 121/66  108/63    Pulse: (!) 111  (!) 107    Resp: (!) 26  (!) 21    Temp:  36.7 C (98 F)  36.4 C (97.5 F)   SpO2: 92%  94%      Gen- NAD, lethargic  CV- RRR  Pulm- non-labored  Abd- packing removed, wound base with areas of oozing blood  Ext- no cyanosis or edema    I have reviewed all lab results.  Lab Results Today:    Results for orders placed or performed during the hospital encounter of 04/21/17 (from the past 24 hour(s))   POC BLOOD GLUCOSE (RESULTS)   Result Value Ref Range    GLUCOSE, POC 140 (H) 70 - 105 mg/dl   H & H   Result Value Ref Range    HGB 7.8 (L) 12.5 - 16.3 g/dL    HCT 23.5 (L) 36.7 - 47.0 %   BASIC METABOLIC PANEL   Result Value Ref Range    SODIUM 128 (L) 136 - 145 mmol/L    POTASSIUM 4.2 3.5 - 5.1 mmol/L    CHLORIDE 96 96 - 111 mmol/L    CO2 TOTAL 26 22 - 32 mmol/L    ANION GAP 6 4 - 13 mmol/L    CALCIUM 8.1 (L) 8.5 - 10.2 mg/dL    GLUCOSE 132 65 - 139 mg/dL    BUN 19 8 - 25 mg/dL    CREATININE 0.84 0.62 - 1.27 mg/dL    BUN/CREA RATIO 23 (H) 6 - 22    ESTIMATED GFR >59 >59 mL/min/1.50m^2   MAGNESIUM   Result Value Ref Range    MAGNESIUM 1.9 1.6 - 2.5 mg/dL   PHOSPHORUS   Result Value Ref Range    PHOSPHORUS 2.5 2.3 - 4.0 mg/dL   PT/INR   Result Value Ref Range    PROTHROMBIN TIME 22.0 (H) 9.5 - 14.1 seconds    INR 1.90 (H) 0.80 - 1.20   CBC WITH DIFF   Result Value Ref Range    WBC 8.1 3.5 - 11.0 x10^3/uL    RBC 2.35 (L) 4.06 - 5.63 x10^6/uL    HGB 7.4 (L) 12.5 - 16.3 g/dL    HCT 21.4 (L) 36.7 - 47.0 %    MCV 91.3 78.0 - 100.0 fL    MCH 31.4 27.4 - 33.0 pg    MCHC 34.4 32.5 - 35.8 g/dL    RDW 16.7 (H) 12.0 - 15.0 %    PLATELETS 173 140 - 450  x10^3/uL    MPV 8.7 7.5 - 11.5 fL    NEUTROPHIL % 74 %    LYMPHOCYTE % 14 %    MONOCYTE % 9 %    EOSINOPHIL % 2 %    BASOPHIL % 1 %    NEUTROPHIL # 6.01 1.50 - 7.70 x10^3/uL    LYMPHOCYTE # 1.12 1.00 - 4.80 x10^3/uL    MONOCYTE # 0.73 0.30 - 1.00 x10^3/uL    EOSINOPHIL # 0.19 0.00 - 0.50 x10^3/uL    BASOPHIL # 0.05 0.00 - 0.20 x10^3/uL     67 y.o. Male with ventral hernia now  s/p elective ventral hernia repair with mesh (04/21/17), s/p takeback for evacuation of hematoma and explantation of prolene mesh.    Regular diet  Will add IV pain medication for dressing changes  Encourage IS  OOB AMB  Will transfuse 1 U PRBC for hgb 7.4  Will hold coumadin, continue lovenox in hopes that it will not thin the blood so much and hopefully allow for some clot formation in the bed of the wound.  Dressing change BID by the surgery team only with combat gauze z pack, we have ordered flo seal to be delivered to the bedside to be placed in the wound base       Gerilyn Nestle, MD      I saw and examined the patient.  I reviewed the resident's note.  I agree with the findings and plan of care as documented in the resident's note.  Any exceptions/additions are edited/noted.    Still bleeding from wound, but it is an ooze with no clearly identified source.  Discussed options with wife and patient.  Will pack with combat gauze today.  Hold Coumadin until oozing stops.  Transfuse due to Hgb of 7.4.  Continue Lovenox due to valve.  Will repack tonight with additional pro-coagulant dressings.      Leticia Clas, MD

## 2017-04-25 NOTE — Nurses Notes (Signed)
Kenneth Weeks with General surgery came to bedside and changed patient abdominal dressing. Patient is sitting in bed and pain is decreasing. New abdominal binder placed on patient. Verbal orders obtained for STAT H&H. Will obtain patient lab and continue to monitor patient.

## 2017-04-25 NOTE — Nurses Notes (Signed)
Abdominal wound soaked through dressings place by service and his abdominal binder. Wound reinforced with ABD and gauze pads. Service paged. Crawford Givens, RN  04/25/2017, 16:56

## 2017-04-25 NOTE — Care Plan (Signed)
Pt is alert and oriented X4, VS monitored closely, and assessments per flow sheets. Pt continues to become very anxious when time to change wound. Pt is able to be calmed with distraction. PRN pain meds given per orders for pain control during wound change this AM. Wound changed at bedside by MDs, dressing is currently clean, dry, and intact, abdominal binder in place. One unit of blood transfused per orders. Pts wife at bedside, call light within reach, will continue to monitor. Crawford Givens, RN  04/25/2017, 12:58   Problem: Adult Inpatient Plan of Care  Goal: Plan of Care Review  Outcome: Ongoing (see interventions/notes)  Goal: Patient-Specific Goal (Individualization)  Outcome: Ongoing (see interventions/notes)  Flowsheets (Taken 04/24/2017 1811)  Individualized Care Needs: likes to be called [name]  Anxieties, Fears or Concerns: "my abdominal wound"  Patient-Specific Goals (Include Timeframe): Pain control  Goal: Absence of Hospital-Acquired Illness or Injury  Outcome: Ongoing (see interventions/notes)  Goal: Optimal Comfort and Wellbeing  Outcome: Ongoing (see interventions/notes)  Goal: Rounds/Family Conference  Outcome: Ongoing (see interventions/notes)     Problem: Fall Injury Risk  Goal: Absence of Fall and Fall-Related Injury  Outcome: Ongoing (see interventions/notes)     Problem: Pain Acute  Goal: Optimal Pain Control  Outcome: Ongoing (see interventions/notes)     Problem: Health Knowledge, Opportunity to Enhance (Adult,Obstetrics,Pediatric)  Goal: Knowledgeable about Health Subject/Topic  Description  Patient will demonstrate the desired outcomes by discharge/transition of care.  Outcome: Ongoing (see interventions/notes)     Problem: Skin Injury Risk Increased  Goal: Skin Health and Integrity  Outcome: Ongoing (see interventions/notes)

## 2017-04-25 NOTE — Nurses Notes (Signed)
Patient up to bedside commode with assistance x 1. Patient had increase in abdominal pain and bloody drainage saturated abdominal dressing. Patient HR sustaining 155 and BP 118/73. Text page sent to Peter Minium to come to bedside to assess patient. Will continue to monitor patient.

## 2017-04-25 NOTE — Nurses Notes (Signed)
Patient bleeding through dressing and soaked gown. Reinforced dressing as per service notes. HR 111, BP 121/66 (78), SPO2 92%. Text page has been sent to Peter Minium with General surgery service. Will continue to monitor patient.

## 2017-04-25 NOTE — Progress Notes (Signed)
Brief Note    Paged to bedside that patient's abdominal wound had bled and was saturating his dressing. Patient reports that he had stood up to walk to use the bathroom and noticed a lot of pain on the left side of his wound followed by an increase in bloody drainage. Of note, patient had saturated dressing earlier in the day which was taken down to reveal a dry wound with no active bleeding. Patient appears comfortable at this time and is normotensive. He denies any symptoms and remains in good spirits. His dressing was taken down to reveal no active bleeding, packing was completely removed and wound bed appeared clean although the dressing had grown saturated. New kerlix dressing was packed tightly into wound. Stat H&H drawn. Will follow-up H&H, transfuse if necessary. If continues to bleed will also consider packing with combat gauze. Patient is on warfarin with an INR of 2.09 this morning.     Please call/page [facility] with any questions, concerns, or changes in the interim     Edythe Clarity, MD   [facility]  Department of Urology - PGY1  04/25/2017, 00:02       I saw and examined the patient.  I reviewed the resident's note.  I agree with the findings and plan of care as documented in the resident's note.  Any exceptions/additions are edited/noted.    Leticia Clas, MD

## 2017-04-25 NOTE — Nurses Notes (Signed)
Transfusion administered per orders. Consent on file. Monitoring for 15 minutes. VS monitored frequently.Educated on blood administration and possible adverse effects. Will continue to monitor. Crawford Givens, RN  04/25/2017, 1103am

## 2017-04-26 LAB — H & H: HGB: 8.5 g/dL — ABNORMAL LOW (ref 12.5–16.3)

## 2017-04-26 LAB — CBC WITH DIFF
BASOPHIL %: 1 %
EOSINOPHIL #: 0.2 10*3/uL (ref 0.00–0.50)
EOSINOPHIL %: 3 %
HCT: 21 % — ABNORMAL LOW (ref 36.7–47.0)
HGB: 7.3 g/dL — ABNORMAL LOW (ref 12.5–16.3)
LYMPHOCYTE #: 0.89 x10ˆ3/uL — ABNORMAL LOW (ref 1.00–4.80)
LYMPHOCYTE %: 15 %
MCH: 31.5 pg (ref 27.4–33.0)
MCHC: 34.6 g/dL (ref 32.5–35.8)
MCV: 91 fL (ref 78.0–100.0)
MONOCYTE %: 9 %
MPV: 8.7 fL (ref 7.5–11.5)
NEUTROPHIL #: 4.41 10*3/uL (ref 1.50–7.70)
PLATELETS: 162 10*3/uL (ref 140–450)
RBC: 2.31 x10ˆ6/uL — ABNORMAL LOW (ref 4.06–5.63)
RDW: 16.9 % — ABNORMAL HIGH (ref 12.0–15.0)
WBC: 6.1 10*3/uL (ref 3.5–11.0)

## 2017-04-26 LAB — BASIC METABOLIC PANEL
BUN/CREA RATIO: 18 (ref 6–22)
BUN: 15 mg/dL (ref 8–25)
CHLORIDE: 96 mmol/L (ref 96–111)
CREATININE: 0.84 mg/dL (ref 0.62–1.27)
ESTIMATED GFR: >59 mL/min/{1.73_m2} (ref >59)
GLUCOSE: 131 mg/dL (ref 65–139)
POTASSIUM: 4.3 mmol/L (ref 3.5–5.1)
SODIUM: 128 mmol/L — ABNORMAL LOW (ref 136–145)

## 2017-04-26 LAB — POC BLOOD GLUCOSE (RESULTS): GLUCOSE, POC: 178 mg/dl — ABNORMAL HIGH (ref 70–105)

## 2017-04-26 LAB — PT/INR
INR: 1.81 — ABNORMAL HIGH (ref 0.80–1.20)
PROTHROMBIN TIME: 21 seconds — ABNORMAL HIGH (ref 9.5–14.1)

## 2017-04-26 MED ORDER — SODIUM CHLORIDE 0.9 % IV BOLUS
40.00 mL | INJECTION | Freq: Once | Status: AC | PRN
Start: 2017-04-26 — End: 2017-04-26

## 2017-04-26 MED ORDER — BISACODYL 10 MG RECTAL SUPPOSITORY
10.0000 mg | Freq: Once | RECTAL | Status: DC | PRN
Start: 2017-04-26 — End: 2017-04-30
  Filled 2017-04-26: qty 1

## 2017-04-26 MED ORDER — ACETAMINOPHEN 325 MG TABLET
650.00 mg | ORAL_TABLET | Freq: Four times a day (QID) | ORAL | Status: DC | PRN
Start: 2017-04-26 — End: 2017-04-30
  Administered 2017-04-26 – 2017-04-27 (×2): 650 mg via ORAL
  Filled 2017-04-26 (×2): qty 2

## 2017-04-26 MED ORDER — MAGNESIUM CITRATE ORAL SOLUTION
296.00 mL | Freq: Once | ORAL | Status: AC
Start: 2017-04-26 — End: 2017-04-26
  Administered 2017-04-26: 296 mL via ORAL
  Filled 2017-04-26: qty 296

## 2017-04-26 MED ORDER — POLYETHYLENE GLYCOL 3350 17 GRAM ORAL POWDER PACKET
17.0000 g | Freq: Every day | ORAL | Status: DC
Start: 2017-04-26 — End: 2017-04-30
  Administered 2017-04-26 – 2017-04-27 (×2): 17 g via ORAL
  Administered 2017-04-28: 0 g via ORAL
  Administered 2017-04-29 – 2017-04-30 (×2): 17 g via ORAL
  Filled 2017-04-26 (×5): qty 1

## 2017-04-26 NOTE — Progress Notes (Signed)
General Surgery Progress Note    I was paged that the patient's abdominal wound dressing was soiled while the patient was having a bowel movement. I went to bedside and removed the packing. I replaced the dressing with a Kerlix and covered with gauze and ABD pad.     Cleatrice Burke, MD  04/26/2017, 95:62

## 2017-04-26 NOTE — Care Plan (Signed)
Pt and wife at bedside. Pt's wife very attentive and involved in pt care.  Pt received 1 unit of PRBC's this shift, pt tolerated well.  Vital signs stable after blood transfusion.  Pt dressing changed this shift per service, dressing was reinforced this shift around 1315 per nursing.  Abdominal binder remains patent.  Spoke with pt regarding PT/OT evaluation orders and encouraged pt to try to get oob when they did evaluate him.  Pt did roll in bed with minimal assistance this shift.  Call bell within reach. Hourly rounding completed per nurse/ca to meet pt's needs.  Pt's wife remains at bedside.     Problem: Adult Inpatient Plan of Care  Goal: Patient-Specific Goal (Individualization)  Outcome: Ongoing (see interventions/notes)  Flowsheets (Taken 04/26/2017 1410)  Anxieties, Fears or Concerns: pt verbalized he was concerned about doing the right thing     Sunny Schlein, RN  04/26/2017, 14:10

## 2017-04-26 NOTE — Nurses Notes (Signed)
Kenneth Weeks from service notified that abdominal dressing required reinforcement with ABD pad d/t saturation. Service also notified of morning hemoglobin of 7.3

## 2017-04-26 NOTE — Nurses Notes (Signed)
Results for DICKSON, KOSTELNIK (MRN [id number]) as of 04/26/2017 06:27   Ref. Range 04/26/2017 05:01   SODIUM Latest Ref Range: 136 - 145 mmol/L 128 (L)     Cristopher Estimable from service notified.

## 2017-04-26 NOTE — Care Management Notes (Signed)
[facility]  Care Management Note    Patient Name: Kenneth Weeks  Date of Birth: [birth year]  Sex: male  Date/Time of Admission: 04/21/2017 10:13 AM  Room/Bed: 817/A  Payor: MEDICARE / Plan: MEDICARE PART A AND B / Product Type: Medicare /    LOS: 5 days   Primary Care Providers:  Viona Gilmore, MD, MD (General)  Viona Gilmore, MD, MD (Claims Attributed)    Admitting Diagnosis:  Ventral hernia [K43.9]    Assessment:      04/26/17 1315   Assessment Details   Assessment Type Continued Assessment   Date of Care Management Update 04/26/17   Date of Next DCP Update 04/28/17   Care Management Plan   Discharge Planning Status plan in progress   Projected Discharge Date 04/30/17   CM will evaluate for rehabilitation potential yes   Patient choice offered to patient/family no   Form for patient choice reviewed/signed and on chart no   Patient aware of possible cost for ambulance transport?  No   Discharge Needs Assessment   Equipment Needed After Discharge other (see comments)  (TBD)   Discharge Facility/Level of Care Needs Home vs Home with Home Health       Discharge Plan:  Home vs home with Home Health  Per TBR patient is still having some bleeding from his wound. If no change by tomorrow will likely place wound vac. Will likely dc home once medically stable with home health. Will continue to follow.     The patient will continue to be evaluated for developing discharge needs.     Case Manager: Lieutenant Diego, RN  Phone: [phone]

## 2017-04-26 NOTE — Progress Notes (Signed)
67 y.o. Male with ventral hernia now s/p elective ventral hernia repair with mesh (04/21/17), take back to OR for evacuation of hematoma explantation of prolene mesh (04/23/2017).    NAEO. Denies abd pain, but states that it is very painful with dressing changes. Denies N/V, F/C, CP/SOB, dizziness/lightheadedness. Tolerating diet. Admits flatus/BM.    Filed Vitals:    04/25/17 2006 04/25/17 2230 04/25/17 2231 04/26/17 0340   BP: 104/62  99/63 (!) 111/56   Pulse: (!) 102  99 86   Resp: 19 20  18    Temp: 36.5 C (97.7 F)  36.6 C (97.9 F) 36.9 C (98.4 F)   SpO2: 96%  92% 95%     Gen- NAD, AOx3  CV- RRR  Pulm- non-labored  Abd- ND, incision opened, mild blood saturation on dressing, small amount of very slow ooze in middle part of wound, soft, NT  Ext- no cyanosis or edema    I have reviewed all lab results.  Lab Results Today:    Results for orders placed or performed during the hospital encounter of 04/21/17 (from the past 24 hour(s))   TYPE AND CROSS RED CELLS - UNITS NON IRRADIATED, 1 Units   Result Value Ref Range    UNITS ORDERED 1     SPECIMEN EXPIRATION DATE 04/28/2017     ABO/RH(D) B POSITIVE     ANTIBODY SCREEN NEGATIVE    BPAM PACKED CELL ORDER   Result Value Ref Range    Coding System [id number]     UNIT NUMBER [id number]     BLOOD COMPONENT TYPE LR RBC, Adsol3, 04730     UNIT DIVISION 00     UNIT DISPENSE STATUS ISSUED,FINAL     TRANSFUSION STATUS OK TO TRANSFUSE     IS CROSSMATCH Electronically Compatible     Product Code [id number]    BASIC METABOLIC PANEL   Result Value Ref Range    SODIUM 128 (L) 136 - 145 mmol/L    POTASSIUM 4.3 3.5 - 5.1 mmol/L    CHLORIDE 96 96 - 111 mmol/L    CO2 TOTAL 27 22 - 32 mmol/L    ANION GAP 5 4 - 13 mmol/L    CALCIUM 8.0 (L) 8.5 - 10.2 mg/dL    GLUCOSE 131 65 - 139 mg/dL    BUN 15 8 - 25 mg/dL    CREATININE 0.84 0.62 - 1.27 mg/dL    BUN/CREA RATIO 18 6 - 22    ESTIMATED GFR >59 >59 mL/min/1.53m^2   MAGNESIUM   Result Value Ref Range    MAGNESIUM 1.9 1.6 - 2.5 mg/dL      PHOSPHORUS   Result Value Ref Range    PHOSPHORUS 2.5 2.3 - 4.0 mg/dL   PT/INR   Result Value Ref Range    PROTHROMBIN TIME 21.0 (H) 9.5 - 14.1 seconds    INR 1.81 (H) 0.80 - 1.20   CBC WITH DIFF   Result Value Ref Range    WBC 6.1 3.5 - 11.0 x10^3/uL    RBC 2.31 (L) 4.06 - 5.63 x10^6/uL    HGB 7.3 (L) 12.5 - 16.3 g/dL    HCT 21.0 (L) 36.7 - 47.0 %    MCV 91.0 78.0 - 100.0 fL    MCH 31.5 27.4 - 33.0 pg    MCHC 34.6 32.5 - 35.8 g/dL    RDW 16.9 (H) 12.0 - 15.0 %    PLATELETS 162 140 - 450 x10^3/uL    MPV 8.7 7.5 - 11.5  fL    NEUTROPHIL % 73 %    LYMPHOCYTE % 15 %    MONOCYTE % 9 %    EOSINOPHIL % 3 %    BASOPHIL % 1 %    NEUTROPHIL # 4.41 1.50 - 7.70 x10^3/uL    LYMPHOCYTE # 0.89 (L) 1.00 - 4.80 x10^3/uL    MONOCYTE # 0.53 0.30 - 1.00 x10^3/uL    EOSINOPHIL # 0.20 0.00 - 0.50 x10^3/uL    BASOPHIL # 0.03 0.00 - 0.20 x10^3/uL     67 y.o. Male with ventral hernia now s/p elective ventral hernia repair with mesh (04/21/17), take back to OR for evacuation of hematoma explantation of prolene mesh (04/23/2017).  -bleeding wound   -no specific point after OR, just slow ooze   -changed packing with regular Kerlix instead of combat gauze   -if no changes tomorrow then will likely place NPWT   -holding coumadin  -mechanical heart valve   -cont tx Lovenox   -holding coumadin due to oozing from wound  -pain control (tylenol prn, dilaudid prn, robaxin prn, oxycodone prn)  -regular diet  -DVT ppx (SCDs, tx Lovenox)  -OOBA/tc  -PT/OT  -IS      Leida Lauth, MD  04/26/2017, 08:10      I saw and examined the patient.  I reviewed the resident's note.  I agree with the findings and plan of care as documented in the resident's note.  Any exceptions/additions are edited/noted.    Leticia Clas, MD

## 2017-04-26 NOTE — Nurses Notes (Signed)
Patient was up to bedside commode for BM after bowel regimen medications were given. Patient had large bowel movement. Upon inspection, dressing was saturated with blood. Cleatrice Burke from service paged and to come to bedside to inspect and change dressing.     2240: PRN dilaudid administered. Service at bedside changing dressing. Will continue to monitor.

## 2017-04-26 NOTE — Care Plan (Signed)
Plan of care reviewed with patient and wife. Abdominal dressing monitored and new abdominal binder applied. Packed abdominal dressing changes continue BID by service. Patient monitored for s&s of bleeding. Scheduled tylenol continued. Lovenox administered while warfarin has been discontinued d/t bleeding. Patient states he is passing gas but has had no BM since 3/12. Senokot and colace administered. Fall precautions maintained. Call bell within reach.       Problem: Adult Inpatient Plan of Care  Goal: Plan of Care Review  Outcome: Ongoing (see interventions/notes)  Goal: Patient-Specific Goal (Individualization)  Outcome: Ongoing (see interventions/notes)  Flowsheets (Taken 04/26/2017 0623)  Individualized Care Needs: IS provided as patient stated he had not recieved one  Goal: Absence of Hospital-Acquired Illness or Injury  Outcome: Ongoing (see interventions/notes)  Goal: Optimal Comfort and Wellbeing  Outcome: Ongoing (see interventions/notes)  Goal: Rounds/Family Conference  Outcome: Ongoing (see interventions/notes)

## 2017-04-26 NOTE — Nurses Notes (Signed)
Pt utilizing IS at bedside independently throughout the shift.

## 2017-04-26 NOTE — Nurses Notes (Signed)
Results for JOHANN, SANTONE (MRN [id number]) as of 04/26/2017 06:01   Ref. Range 04/26/2017 05:01   HGB Latest Ref Range: 12.5 - 16.3 g/dL 7.3 (L)       Pt had gotten 1 U blood yesterday, repeat H&H had not been obtained after blood had finished at 1300. Patient's dressing has only had small bloody drainage and MAP has been >65 all night  Cleatrice Burke from service notified.

## 2017-04-27 ENCOUNTER — Other Ambulatory Visit: Payer: Self-pay

## 2017-04-27 LAB — CBC WITH DIFF
BASOPHIL #: 0.02 x10ˆ3/uL (ref 0.00–0.20)
EOSINOPHIL #: 0.18 x10ˆ3/uL (ref 0.00–0.50)
EOSINOPHIL %: 3 %
HCT: 24.1 % — ABNORMAL LOW (ref 36.7–47.0)
HGB: 8.2 g/dL — ABNORMAL LOW (ref 12.5–16.3)
LYMPHOCYTE #: 0.91 x10ˆ3/uL — ABNORMAL LOW (ref 1.00–4.80)
MCH: 31.1 pg (ref 27.4–33.0)
MCHC: 34.2 g/dL (ref 32.5–35.8)
MCV: 91.1 fL (ref 78.0–100.0)
MONOCYTE #: 0.53 x10ˆ3/uL (ref 0.30–1.00)
MPV: 7.8 fL (ref 7.5–11.5)
MPV: 7.8 fL (ref 7.5–11.5)
NEUTROPHIL #: 5.43 x10ˆ3/uL (ref 1.50–7.70)
PLATELETS: 180 x10ˆ3/uL (ref 140–450)
RBC: 2.64 x10ˆ6/uL — ABNORMAL LOW (ref 4.06–5.63)
RDW: 17.8 % — ABNORMAL HIGH (ref 12.0–15.0)
WBC: 7.1 x10ˆ3/uL (ref 3.5–11.0)

## 2017-04-27 LAB — BPAM PACKED CELL ORDER
UNIT DIVISION: 0
UNIT DIVISION: 0

## 2017-04-27 LAB — BASIC METABOLIC PANEL
ANION GAP: 4 mmol/L (ref 4–13)
BUN: 13 mg/dL (ref 8–25)
CALCIUM: 8.3 mg/dL — ABNORMAL LOW (ref 8.5–10.2)
CHLORIDE: 97 mmol/L (ref 96–111)
CO2 TOTAL: 30 mmol/L (ref 22–32)
CREATININE: 0.83 mg/dL (ref 0.62–1.27)
ESTIMATED GFR: >59 mL/min/1.73mˆ2 (ref >59)
GLUCOSE: 137 mg/dL (ref 65–139)
POTASSIUM: 3.9 mmol/L (ref 3.5–5.1)
SODIUM: 131 mmol/L — ABNORMAL LOW (ref 136–145)

## 2017-04-27 LAB — PHOSPHORUS: PHOSPHORUS: 3 mg/dL (ref 2.3–4.0)

## 2017-04-27 LAB — MAGNESIUM: MAGNESIUM: 2 mg/dL (ref 1.6–2.5)

## 2017-04-27 LAB — TYPE AND CROSS RED CELLS - UNITS: UNITS ORDERED: 2

## 2017-04-27 MED ADMIN — sodium chloride 0.9 % (flush) injection syringe: @ 14:00:00

## 2017-04-27 MED ADMIN — sodium chloride 0.9 % intravenous solution: ORAL | @ 20:00:00 | NDC 00338004904

## 2017-04-27 MED ADMIN — lactated Ringers intravenous solution: ORAL | @ 09:00:00 | NDC 00338011704

## 2017-04-27 NOTE — Care Plan (Signed)
Plan of care reviewed with patient and wife. Abdominal dressing changed by service during night d/t saturation after ambulation to [facility]. Dressing reinforced as well. Patient received blood during day with hemoglobin recheck of 8.5. Patient continues to be monitored for signs of bleeding. PRN tylenol and dilaudid administered. Mag citrate, miralax, Senakot, and colace administered and patient was able to have a large BM. Lovenox continued d/t heart valve. Fall precautions maintained. Call bell within reach.

## 2017-04-27 NOTE — Nurses Notes (Signed)
General surgery gold at bedside to place wound vac, on at -125 continuous, black foam applied to site.  Service aware that the patient's heart rate was elevated prior into the 150-160's with activity then going back down with rest.

## 2017-04-27 NOTE — Nurses Notes (Signed)
Patient transferred to [facility] bed 830B via nurse in bed on telemetry with belongings. Wife with patient and new nurse at bedside.

## 2017-04-27 NOTE — Care Plan (Signed)
[facility]  Occupational Therapy Initial Evaluation    Patient Name: Kenneth Weeks  Date of Birth: 1950-03-10  Height: Height: 190.5 cm (_0 )  Weight: Weight: 121.7 kg (268 lb 4.8 oz)  Room/Bed: 817/A  Payor: MEDICARE / Plan: MEDICARE PART A AND B / Product Type: Medicare /     Assessment:   Kenneth Weeks tolerated OT evaluation well. Pt limited with fxnl activity d/t pain & decreased endurance. Pt requiring assist x1 for most ADLs, ADL transfers, & short distance fxnl mobility. Anticipate increased assist required for IADLs & MRADLs. Pt bed/bathroom located on second level of home. Pt would like to return home & does have 24/7 assist avaliable from spouse. Anticipate home pending pt progress with mobility, ADLs, & ADL transfers. At this time would recommend IR vs home with 24/7 assist & [facility] OT when pt medically appropriate for d/c.      Discharge Needs:   Equipment Recommendation: raised toilet seat    Discharge Disposition: inpatient rehabilitation facility, home with home health, home with 24/7 assistance(IR vs home with 24/7 & [facility] OT (see assessment))  The above recommendation is based upon the current examination and evaluation performed on this date. As subsequent sessions are completed, recommendations will be updated accordingly.    JUSTIFICATION OF DISCHARGE RECOMMENDATION   Based on current diagnosis, functional performance prior to admission, and current functional performance, this patient requires continued OT services in inpatient rehabilitation facility, home with home health, home with 24/7 assistance(IR vs home with 24/7 & HH OT (see assessment))  in order to achieve significant functional improvements.    Plan:   Current Intervention: ADL retraining, balance training, bed mobility training, endurance training, strengthening, transfer training    To provide Occupational therapy services 1x/day, minimum of 2x/week, until discharge, until goals are met.       The  risks/benefits of therapy have been discussed with the patient/caregiver and he/she is in agreement with the established plan of care.       Subjective & Objective        04/27/17 0847   Therapist Pager   OT Assigned/ Pager # [phone] Konen [phone]   Rehab Session   Document Type evaluation   Total OT Minutes: 26   Patient Effort good   Symptoms Noted During/After Treatment increased pain   Symptoms Noted Comment increased abdominal discomfort with bed mobility & STS transfers   General Information   Patient Profile Reviewed? yes   Pertinent History of Current Functional Problem Kenneth Weeks is a 67 y.o. male who was referred for evaluation of incisional ventral hernia.  This hernia developed a few years ago immediately after his right colectomy which was performed for lower GI bleed and adenocarcinoma of the ascending colon.  Per his report, he has a mild form of Marfan's Syndrome.  He reports that the hernia periodically obstructs causing him to be admitted.    Medical Lines PIV Line;Telemetry   Respiratory Status room air   Existing Precautions/Restrictions full code;fall precautions   Pre Treatment Status   Pre Treatment Patient Status Patient supine in bed;Telephone within reach;Call light within reach;Nurse approved session   Support Present Pre Treatment  Family present  (spouse)   Communication Pre Treatment  Nurse   Communication Pre Treatment Comment Pt/RN agreeable to OT consult   Mutuality/Individual Preferences   Individualized Care Needs OOB to chair/BSC A x1    Patient-Specific Goals (Include Timeframe) wants to go home  Living Environment   Lives With spouse   [place] Pt has 1 STE home. On main level pt has a commode but no shower. Pt bedroom is on the 2nd level with 13 stairs (both HR/half HR). Pt is willing to stay on main level with recliner & sponge bath. Wife is Psychologist, prison and probation services.   Functional Level Prior   Ambulation 0 - independent   Transferring 0 -  independent   Toileting 0 - independent   Bathing 0 - independent   Dressing 0 - independent   Eating 0 - independent   Prior Functional Level Comment (I) prior for ADLs & IADLs, no use of DME   Self-Care   Current Activity Tolerance moderate   Equipment Currently Used at Home no   Vital Signs   Vitals Comment vss when monitiored   Pain Assessment   Pre/Post Treatment Pain Comment abdominal pain with OOB mobility & bed mobility but did not formally rate    Coping/Psychosocial Response Interventions   Plan Of Care Reviewed With patient   Cognitive Assessment/Interventions   Behavior/Mood Observations behavior appropriate to situation, WNL/WFL;alert;cooperative   Orientation Status oriented x 4   Attention WNL/WFL   Follows Commands WNL   RUE Assessment   RUE Assessment WFL- Within Functional Limits   LUE Assessment   LUE Assessment WFL- Within Functional Limits   Mobility Assessment/Training   Mobility Comment fxnl mobility ~76f to chair CGA   Bed Mobility Assessment/Treatment   Bed Mobility, Assistive Device bed rails   Supine-Sit Independence contact guard assist;verbal cues required   Sit to Supine, Independence not tested   Comment vc's for log roll technique   Transfer Assessment/Treatment   Sit-Stand Independence contact guard assist   Stand-Sit Independence contact guard assist   Transfer Impairments pain;endurance   Lower Body Dressing Assessment/Training   Position sitting;standing   DRESSING ASSESSED Don Pants- pull up   Independence Level  minimum assist (75% patient effort)   Grooming Assessment/Training   Position sitting   Independence Level supervision required   Comment brush teeth seated in chair at sink   IADL Evaluation   IADL Comments anticipate increased assist required   Balance Skill Training   Comment unsupported   Sitting Balance: Static good balance   Sitting, Dynamic (Balance) fair + balance   Sit-to-Stand Balance fair balance   Standing Balance: Static fair balance   Standing Balance:  Dynamic fair - balance   Therapeutic Exercise/Activity   Comment decreased fxnl activity tolerance noted   Post Treatment Status   Post Treatment Patient Status Patient sitting in bedside chair or w/c;Call light within reach;Telephone within reach   Support Present Post Treatment  Family present  (MD team)   Communication Post Treatement Nurse   Communication Post Treatment Comment pt in chair & updated on pt OOB A x1    Care Plan Goals   OT Rehab Goals Bed Mobility Goal;Grooming Goal;LB Dressing Goal;Toileting Goal;Transfer Training Goal;UB Dressing Goal   Bed Mobility Goal   Bed Mobility Goal, Date Established 04/27/17   Bed Mobility Goal, Time to Achieve by discharge   Bed Mobility Goal, Activity Type all bed mobility activities   Bed Mobility Goal, Independence Level modified independence   Bed Mobility Goal, Additional Goal   (bed flat)   Grooming Goal   Grooming Goal, Date Established 04/27/17   Grooming Goal, Time to Achieve by discharge   Grooming Goal, Activity Type all grooming tasks   Grooming  Goal, Independence  independent   Grooming Goal, Position standing   LB Dressing Goal   LB Dressing Goal, Date Established 04/27/17   LB Dressing Goal, Time to Achieve by discharge   LB Dressing Goal, Activity Type all lower body dressing tasks   LB Dressing Goal, Independence Level independent   Toileting Goal   Toileting Goal, Date Established 04/27/17   Toileting Goal, Time to Achieve by discharge   Toileting Goal, Activity Type all toileting tasks   Toileting Goal, Independence Level independent   Transfer Training Goal   Transfer Training Goal, Date Established 04/27/17   Transfer Training Goal, Time to Achieve by discharge   Transfer Training Goal, Activity Type all transfers   Transfer Training Goal, Independence Level independent   UB Dressing Goal   UB Dressing  Goal, Date Established 04/27/17   UB Dressing Goal, Time to Achieve by discharge   UB Dressing Goal, Activity Type all upper body dressing tasks      UB Dressing Goal, Independence Level independent   Planned Therapy Interventions, OT Eval   Planned Therapy Interventions ADL retraining;balance training;bed mobility training;endurance training;strengthening;transfer training   Occupational Therapy Clinical Impression   Functional Level at Time of Session Kenneth Macera tolerated OT evaluation well. Pt limited with fxnl activity d/t pain & decreased endurance. Pt requiring assist x1 for most ADLs, ADL transfers, & short distance fxnl mobility. Anticipate increased assist required for IADLs & MRADLs. Pt bed/bathroom located on second level of home. Pt would like to return home & does have 24/7 assist avaliable from spouse. Anticipate home pending pt progress with mobility, ADLs, & ADL transfers. At this time would recommend IR vs home with 24/7 assist & [facility] OT when pt medically appropriate for d/c.   Criteria for Skilled Therapeutic Interventions Met (OT) yes;meets criteria;skilled treatment is necessary   Rehab Potential good, to achieve stated therapy goals   Therapy Frequency 1x/day;minimum of 2x/week   Predicted Duration of Therapy until discharge;until goals are met   Anticipated Equipment Needs at Discharge raised toilet seat   Anticipated Discharge Disposition inpatient rehabilitation facility;home with home health;home with 24/7 assistance  (IR vs home with 24/7 & HH OT (see assessment))   Evaluation Complexity Justification   Occupational Profile Review Expanded review   Performance Deficits 3-5 deficits;Balance;Endurance;Mobility;Pain  (ADLs, MRADLs, IADLs)   Clinical Decision Making Moderate analytic complexity   Evaluation Complexity Moderate       Therapist:   Sabino Snipes, MOT, OTR/L    Pager #: [phone]

## 2017-04-27 NOTE — Care Plan (Addendum)
POC discussed with patient and significant other here with Ventral hernia with obstruction without, gangrene.  Wound Vac to mid abdomen at -125 , intact.   Abdominal binder intact. Patient tolerated getting up to the chair and ambulating with the assistance of 1 today, well.  Will continue to assess hemodynamics and labs.     Problem: Adult Inpatient Plan of Care  Goal: Patient-Specific Goal (Individualization)  Outcome: Ongoing (see interventions/notes)  Flowsheets  Taken 04/27/2017 0847 by Sabino Snipes, OT  Individualized Care Needs: OOB to chair/BSC A x1   Patient-Specific Goals (Include Timeframe): wants to go home  Taken 04/27/2017 0430 by [place], RN  Anxieties, Fears or Concerns: Patient is worried he will continue to bleed every time he ambulates to [facility] or in room.

## 2017-04-27 NOTE — Nurses Notes (Addendum)
Phoned by tele, the patient's heart rate was up in the 160 seems to be sustained in the 130's, Upon assessment patient was up and moving, states he's asymptomatic, Heart rate at this time in the upper 119 to 124.  Will continue to assess.

## 2017-04-27 NOTE — Progress Notes (Signed)
Negative pressure wound therapy application  Type: Surgical  Location:  Mid abdomen  Length: 8 cm  Width: 7 cm  Depth: 5 cm  Undermining/Tunneling: Yes, bilaterally (not packing)  Wound Base %: 0  Wound Edges: smooth  Drainage amt: Moderate  Serosanguineous drainage with odor Absent  Peri-wd Skin: inflammation just superior to wound from prior wet to dry dressings.      Leida Lauth, MD  04/27/2017, 13:31      I saw and examined the patient.  I reviewed the resident's note.  I agree with the findings and plan of care as documented in the resident's note.  Any exceptions/additions are edited/noted.    I was not present for the placement of this wound vac, but I was aware it was occurring and was immediately available if needed.      Leticia Clas, MD

## 2017-04-27 NOTE — Nurses Notes (Signed)
Patient's dressing was saturated again through abdominal binder after ambulation. Service notified and dressing reinforced.   Garth Schlatter to bedside to assess incision. Old ABD pads removed and new ones placed. Wound vac materials ordered by nursing per verbal order. Service to come later during day to place wound vac.

## 2017-04-27 NOTE — Care Plan (Signed)
[place]  Physical Therapy Initial Evaluation    Patient Name: Kenneth Weeks  Date of Birth: 1950-11-09  Height: Height: 190.5 cm (6\' 3" )  Weight: Weight: 121.7 kg (268 lb 4.8 oz)  Room/Bed: 817/A  Payor: MEDICARE / Plan: MEDICARE PART A AND B / Product Type: Medicare /     Assessment:      Kenneth Weeks presents with generalized mobility deficits related to his surgery and sequelae. He has good rehab potential to recover and return home at discharge with assist.    Discharge Needs:   Equipment Recommendation: front wheeled walker (Necessary for accomplishing ADLs/basic life tasks such as use of the restroom, access to self care items, mobility in the home, and participation in social and community activities. Pt demonstrates safe and effective use of the device.)  Discharge Recommendation: home with assist    JUSTIFICATION OF DISCHARGE RECOMMENDATION   Based on current diagnosis, functional performance prior to admission, and current functional performance, this patient requires continued PT services in home with assist in order to achieve significant functional improvements in these deficit areas: aerobic capacity/endurance, gait, locomotion, and balance, muscle performance. The above recommendations are based upon the current examination and evaluation performed on this date. As subsequent sessions are completed, recommendations will be updated accordingly.        Plan:   Current Intervention: balance training, bed mobility training, gait training, home exercise program, stair training, strengthening, transfer training  To provide physical therapy services 1x/day, minimum of 2x/week  for duration of until discharge.    The risks/benefits of therapy have been discussed with the patient/caregiver and he/she is in agreement with the established plan of care.       Subjective & Objective        04/27/17 1030   Therapist Pager   PT Assigned/ Pager # dave [phone]   Rehab Session      Document Type evaluation   Total PT Minutes: 20   Patient Effort good   General Information   Patient/Family/Caregiver Comments/Observations Resting in chair, spouse present and supportive.   Pertinent History of Current Functional Problem "Kenneth Weeks is a 67 y.o. male who was referred for evaluation of incisional ventral hernia.  This hernia developed a few years ago immediately after his right colectomy which was performed for lower GI bleed and adenocarcinoma of the ascending colon.  Per his report, he has a mild form of Marfan's Syndrome.  He reports that the hernia periodically obstructs causing him to be admitted. "   Respiratory Status room air   Existing Precautions/Restrictions full code;fall precautions   Mutuality/Individual Preferences   Individualized Care Needs OOB to chair TID FWW AX1   Living Environment   Lives With spouse   Living Arrangements house   Number of Stairs to [place] 1   Number of Stairs Within Home 13   Functional Level Prior   Ambulation 0 - independent   Transferring 0 - independent   Pre Treatment Status   Communication Pre Treatment  Nurse   Cognitive Assessment/Interventions   Behavior/Mood Observations behavior appropriate to situation, WNL/WFL   Orientation Status oriented x 4   Attention WNL/WFL   Follows Commands WNL   Pain Assessment   Pain Scale: Numbers, Pretreatment 5/10   Pain Scale: Numbers, Post-Treatment 5/10   Pre/Post Treatment Pain Comment abdominal   RUE Assessment   RUE Assessment WFL- Within Functional Limits   LUE Assessment   LUE Assessment  WFL- Within Functional Limits   RLE Assessment   RLE Assessment WFL- Within Functional Limits   LLE Assessment   LLE Assessment WFL- Within Functional Limits   Transfer Assessment/Treatment   Sit-Stand Independence stand-by assistance   Stand-Sit Independence stand-by assistance   Transfer Comment cues for technique   Gait Assessment/Treatment   Distance in Feet 30 ft with FWW and stand by assist, no major LOB  or unsteadiness noted but analgic, guarded gait and decreased gait speed   Balance Skill Training   Comment with FWW   Sitting Balance: Static good balance   Sitting, Dynamic (Balance) good balance   Sit-to-Stand Balance fair balance   Standing Balance: Static fair + balance   Standing Balance: Dynamic fair balance   Therapeutic Exercise/Activity   Comment limited by wound drainage and need for wound vac placement   Post Treatment Status   Post Treatment Patient Status Patient sitting in bedside chair or w/c;Call light within reach;Telephone within reach;Sitter select activated   Support Present Post Treatment  Family present   Programme researcher, broadcasting/film/video Mobility Am-PAC/6Clicks Score   Turning in bed without bedrails 3   Lying on back to sitting on edge of flat bed 3   Moving to and from a bed to a chair 3   Standing up from chair 4   Walk in room 3   Climbing 3-5 steps with railing 3   Raw Score total 19   Standardized (t-scale) score 42.48   CMS 0-100% Score 36.99   CMS Modifier CJ   Physical Therapy Clinical Impression   Assessment Kenneth Weeks presents with generalized mobility deficits related to his surgery and sequelae. He has good rehab potential to recover and return home at discharge with assist.   Patient/Family Goals Statement return home   Criteria for Skilled Therapeutic yes   Pathology/Pathophysiology Noted musculoskeletal   Impairments Found (describe specific impairments) aerobic capacity/endurance;gait, locomotion, and balance;muscle performance   Functional Limitations in Following  self-care;home management;community/leisure   Disability: Inability to Perform community/leisure   Rehab Potential good, to achieve stated therapy goals   Therapy Frequency 1x/day;minimum of 2x/week   Predicted Duration of Therapy Intervention (days/wks) until discharge   Anticipated Equipment Needs at Discharge (PT) front wheeled walker   Anticipated Discharge Disposition home with assist   Highest level  of Mobility score   Exercise Level 7- Walked 25 feet or more   Evaluation Complexity Justification   Patient History: Co-morbidity/factors that impact Plan of Care 3 or more that impact Plan of Care;Obesity: causing adverse impact on function;One or more other medical co-morbidity;Surgical procedure: causing pain &/or impaired function;Complicated prior level of function/decline in function;Home accessibility barriers/factors   Examination Components 4 or more Exam elements addressed;Balance;Bed mobility;Transfers;Ambulation   Presentation Evolving: Symptoms, complaints, characteristics of condition changing &/or cognitive deficits present   Clinical Decision Making Moderate complexity   Evaluation Complexity Moderate complexity   Care Plan Goals   PT Rehab Goals Bed Mobility Goal;Gait Training Goal;Transfer Training Goal   Bed Mobility Goal   Bed Mobility Goal, Time to Achieve by discharge   Bed Mobility Goal, Activity Type all bed mobility activities   Bed Mobility Goal, Independence Level independent   Gait Training  Goal, Distance to Achieve   Gait Training  Goal, Time to Achieve by discharge   Gait Training  Goal, Independence Level modified independence   Gait Training  Goal, Assist Device walker, rolling   Gait Training  Goal, Distance to  Achieve 250 ft   Transfer Training Goal   Transfer Training Goal, Time to Achieve by discharge   Transfer Training Goal, Activity Type all transfers   Transfer Training Goal, Independence Level modified independence   Transfer Training Goal, Assist Device walker, rolling   Planned Therapy Interventions, PT Eval   Planned Therapy Interventions (PT) balance training;bed mobility training;gait training;home exercise program;stair training;strengthening;transfer training       Therapist:   Rayvon Char. Latina Craver, DPT  Pager #: [phone]

## 2017-04-27 NOTE — Progress Notes (Signed)
67 y.o. Male with ventral hernia now s/p elective ventral hernia repair with mesh (04/21/17), take back to OR for evacuation of hematoma explantation of prolene mesh (04/23/2017).    NAEO. Denies abd pain, but states that it is very painful with dressing changes. Denies N/V, F/C, CP/SOB, dizziness/lightheadedness. Tolerating diet. Admits flatus/BM.    Filed Vitals:    04/27/17 0232 04/27/17 0725 04/27/17 0753 04/27/17 0819   BP:  117/69     Pulse:  (!) 110  94   Resp:  (!) 25  17   Temp: 36.7 C (98 F)  36.5 C (97.7 F)    SpO2:  98%  97%     Gen- NAD, AOx3  CV- RRR  Pulm- non-labored  Abd- ND, incision opened, mild blood saturation on dressing, soft, NT  Ext- no cyanosis or edema    I have reviewed all lab results.  Lab Results Today:    Results for orders placed or performed during the hospital encounter of 04/21/17 (from the past 24 hour(s))   H & H   Result Value Ref Range    HGB 8.5 (L) 12.5 - 16.3 g/dL    HCT 24.7 (L) 36.7 - 47.0 %   BASIC METABOLIC PANEL   Result Value Ref Range    SODIUM 131 (L) 136 - 145 mmol/L    POTASSIUM 3.9 3.5 - 5.1 mmol/L    CHLORIDE 97 96 - 111 mmol/L    CO2 TOTAL 30 22 - 32 mmol/L    ANION GAP 4 4 - 13 mmol/L    CALCIUM 8.3 (L) 8.5 - 10.2 mg/dL    GLUCOSE 137 65 - 139 mg/dL    BUN 13 8 - 25 mg/dL    CREATININE 0.83 0.62 - 1.27 mg/dL    BUN/CREA RATIO 16 6 - 22    ESTIMATED GFR >59 >59 mL/min/1.62m^2   MAGNESIUM   Result Value Ref Range    MAGNESIUM 2.0 1.6 - 2.5 mg/dL   PHOSPHORUS   Result Value Ref Range    PHOSPHORUS 3.0 2.3 - 4.0 mg/dL   PT/INR   Result Value Ref Range    PROTHROMBIN TIME 15.7 (H) 9.5 - 14.1 seconds    INR 1.34 (H) 0.80 - 1.20   CBC WITH DIFF   Result Value Ref Range    WBC 7.1 3.5 - 11.0 x10^3/uL    RBC 2.64 (L) 4.06 - 5.63 x10^6/uL    HGB 8.2 (L) 12.5 - 16.3 g/dL    HCT 24.1 (L) 36.7 - 47.0 %    MCV 91.1 78.0 - 100.0 fL    MCH 31.1 27.4 - 33.0 pg    MCHC 34.2 32.5 - 35.8 g/dL    RDW 17.8 (H) 12.0 - 15.0 %    PLATELETS 180 140 - 450 x10^3/uL    MPV 7.8 7.5 -  11.5 fL    NEUTROPHIL % 77 %    LYMPHOCYTE % 13 %    MONOCYTE % 8 %    EOSINOPHIL % 3 %    BASOPHIL % 0 %    NEUTROPHIL # 5.43 1.50 - 7.70 x10^3/uL    LYMPHOCYTE # 0.91 (L) 1.00 - 4.80 x10^3/uL    MONOCYTE # 0.53 0.30 - 1.00 x10^3/uL    EOSINOPHIL # 0.18 0.00 - 0.50 x10^3/uL    BASOPHIL # 0.02 0.00 - 0.20 x10^3/uL     67 y.o. Male with ventral hernia now s/p elective ventral hernia repair with mesh (04/21/17), take back to  OR for evacuation of hematoma explantation of prolene mesh (04/23/2017).  -bleeding wound   -no specific point after OR, just slow ooze   -will place NPWT today   -holding coumadin (will restart after 24 hrs of no bleeding with wound vac)  -mechanical heart valve   -cont tx Lovenox   -holding coumadin due to oozing from wound  -pain control (tylenol prn, dilaudid prn, robaxin prn, oxycodone prn)  -regular diet  -DVT ppx (SCDs, tx Lovenox)  -OOBA/tc  -PT/OT  -IS      Leida Lauth, MD  04/27/2017, 09:33      I saw and examined the patient.  I reviewed the resident's note.  I agree with the findings and plan of care as documented in the resident's note.  Any exceptions/additions are edited/noted.    Leticia Clas, MD

## 2017-04-27 NOTE — Nurses Notes (Signed)
Dr Leida Lauth text paged the patient's abdominal dressing has saturated thru with blood, 2 times today, and we've reinforced.  Will continue to assess.

## 2017-04-28 DIAGNOSIS — I4891 Unspecified atrial fibrillation: Secondary | ICD-10-CM

## 2017-04-28 DIAGNOSIS — R000 Tachycardia, unspecified: Secondary | ICD-10-CM

## 2017-04-28 LAB — ECG 12-LEAD
Atrial Rate: 182 {beats}/min
QRS Duration: 94 ms
QT Interval: 274 ms

## 2017-04-28 LAB — BASIC METABOLIC PANEL
ANION GAP: 5 mmol/L (ref 4–13)
BUN/CREA RATIO: 15 (ref 6–22)
ESTIMATED GFR: >59 mL/min/{1.73_m2} (ref >59)
POTASSIUM: 4.2 mmol/L (ref 3.5–5.1)

## 2017-04-28 LAB — CBC WITH DIFF
BASOPHIL #: 0.03 10*3/uL (ref 0.00–0.20)
BASOPHIL %: 0 %
EOSINOPHIL #: 0.17 10*3/uL (ref 0.00–0.50)
EOSINOPHIL %: 2 %
LYMPHOCYTE #: 0.64 10*3/uL — ABNORMAL LOW (ref 1.00–4.80)
LYMPHOCYTE %: 8 %
MCH: 31.2 pg (ref 27.4–33.0)
MCV: 92.1 fL (ref 78.0–100.0)
MONOCYTE #: 0.64 10*3/uL (ref 0.30–1.00)
NEUTROPHIL #: 6.33 10*3/uL (ref 1.50–7.70)
NEUTROPHIL %: 81 %
PLATELETS: 200 10*3/uL (ref 140–450)
RBC: 2.59 10*6/uL — ABNORMAL LOW (ref 4.06–5.63)
RDW: 17.5 % — ABNORMAL HIGH (ref 12.0–15.0)
WBC: 7.8 10*3/uL (ref 3.5–11.0)

## 2017-04-28 LAB — MAGNESIUM: MAGNESIUM: 2 mg/dL (ref 1.6–2.5)

## 2017-04-28 LAB — PHOSPHORUS: PHOSPHORUS: 3 mg/dL (ref 2.3–4.0)

## 2017-04-28 MED ORDER — WARFARIN 7.5 MG TABLET
7.5000 mg | ORAL_TABLET | Freq: Every evening | ORAL | Status: DC
Start: 2017-04-28 — End: 2017-04-30
  Administered 2017-04-28 – 2017-04-29 (×2): 7.5 mg via ORAL
  Filled 2017-04-28 (×3): qty 1

## 2017-04-28 MED ADMIN — sodium chloride 0.9 % (flush) injection syringe: ORAL | @ 09:00:00

## 2017-04-28 MED ADMIN — warfarin 7.5 mg tablet: ORAL | @ 21:00:00

## 2017-04-28 MED ADMIN — lidocaine HCL 20 mg/mL (2 %) injection solution: ORAL | @ 03:00:00

## 2017-04-28 NOTE — Care Plan (Signed)
Discharge Plan:  Home with Home Health (code 6)  When discharge ready patient will go home with home health and a wound vac. Patient has requested [facility]; referral has been sent. [employer] order for wound vac has been obtained and referral has been sent. Family will provide transportation at discharge.      The patient will continue to be evaluated for developing discharge needs.     Case Manager: Justice Rocher, RN  Phone: [phone]

## 2017-04-28 NOTE — Care Management Notes (Signed)
[facility]  Care Management Note    Patient Name: Kenneth Weeks  Date of Birth: [birth year]  Sex: male  Date/Time of Admission: 04/21/2017 10:13 AM  Room/Bed: 830/B  Payor: MEDICARE / Plan: MEDICARE PART A AND B / Product Type: Medicare /    LOS: 7 days   Primary Care Providers:  Viona Gilmore, MD, MD (General)  Viona Gilmore, MD, MD (Claims Attributed)    Admitting Diagnosis:  Ventral hernia [K43.9]    Assessment:      04/28/17 1448   Assessment Details   Assessment Type Continued Assessment   Date of Care Management Update 04/28/17   Date of Next DCP Update 04/30/17   Care Management Plan   Discharge Planning Status plan in progress   Projected Discharge Date 04/30/17   Facility or [place]    Discharge Needs Assessment   Outpatient/Agency/Support Group Needs homecare agency   Equipment Currently Used at Home walker, rolling   Equipment Needed After Discharge wound care supplies;other (see comments)  (wound vac )   Discharge Facility/Level of Care Needs Home with Home Health (code 6)   Transportation Available family or friend will provide       Discharge Plan:  Home with Home Health (code 6)  When discharge ready patient will go home with home health and a wound vac. Patient has requested [facility]; referral has been sent. [employer] order for wound vac has been obtained and referral has been sent. Family will provide transportation at discharge.      The patient will continue to be evaluated for developing discharge needs.     Case Manager: Justice Rocher, RN  Phone: [phone]

## 2017-04-28 NOTE — Nurses Notes (Signed)
Patient ambulated and wound vac suction pulled off from wound vac to abdomen. There was a piece of black foam in the wound and another piece of black foam was added and a new piece of clear wound vac tape was added. Wound vac is now working properly and suction is at 125. Will monitor patient closely. PRN IV Dilaudid was given.

## 2017-04-28 NOTE — Consults (Signed)
[facility]  MEDICINE CONSULT    Kenneth Weeks, Kenneth Weeks, 67 y.o. male  Date of Birth:  1950/02/27  Date of Admission:  04/21/2017  Date of service: 04/28/2017    Service: General surgery  Requesting MD: Dr. 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 Medicine was consulted for any further recommendations to optimize his management of his his atrial fibrillation given his new tachycardia.     Atrial fibrillation  - CHADS Vasc score 2  - pt has intermittent dizziness with standing. Recommend holding home torsemide for now and giving a gentle bolus of 500 cc of NS given over 2hrs. Given recent surgery and blood loss, hesitant at this point of increasing his home Toprol without making sure that dehydration is not contributing to his tachycardia.   - continue Lovenox and warfarin     Thank you for the consult. We will continue to follow. Please call with any questions or concerns.     HPI  Kenneth Weeks is a 67 yo M with PMH of atrial fibrillation, CHF, DM type II, mechanical aortic valve on warfarin, and Marfans syndrome who presented with ventral hernia s/p elective ventral hernia repair with mesh on 04/21/17 and evacuation of hematoma explantation of prolene mesh on 04/23/2017. He has been doing well overall, however today he has been in atrial fibrillation with RVR. His heart rate has been mildly elevated. He denies any chest pain or palpitations. He states he has intermittent dizziness when standing up. Medicine was consulted for any further recommendations to optimize his management of his his atrial fibrillation. Of note, he states that he was started on Toprol XL  around 3-7 months ago and has hx of cardioversion in the past.       Past Medical History:   Diagnosis Date   . A-fib (CMS HCC)     Chronic anti-coagulant therapy; Hx cardioversions   . Abdominal hernia     Ventral hernia; Left Inguinal hernia   . Acute on chronic renal failure (CMS HCC)    . Biliary and gallbladder disorder     "Gallstones"   . Blood thinned due to long-term anticoagulant use    . Bruises easily    . Cancer (CMS HCC) 04/09/2017    Colon grade 0 surgery   . Congestive heart failure (CMS HCC) 04/09/2017    CHF 01/2017    . Diabetes mellitus (CMS [place])     type ii diet controlled   . Disorder of liver 04/09/2017    fatty liver   . Dysrhythmias     a fib   . Ejection fraction < 50%    . Heart murmur    . Hemorrhage of gastrointestinal tract 5/16   . Hemorrhagic shock (CMS HCC)    . Hepatic cirrhosis (CMS HCC) 85/27/7824    non alcoholic   . Hx of transfusion 04/09/2017    no reaction   . Hydrocele    . Hypertension 07/10/2014   . Hypothyroid    . Marfan syndrome    . Neck problem 04/09/2017    LROM   . Non-healing non-surgical wound    . Obesity    .  Palpitations    . Peripheral vascular disease (CMS [place]) 04/09/2017    no recent problems since starting lasix in 01/2017   . Syncope    . Thyroid disease     hypothyroid   . Transfusion history     s/p Multiple units PRC/FFP this adm.   . Type 2 diabetes mellitus (CMS [place]) 04/09/2017    diet controlled, HA1C 6.0   . Valvular disease 04/09/2017    [facility] aortic valve replacement-chronic anti-coagulant therapy   . Wears glasses        Past Surgical History:   Procedure Laterality Date   . COLONOSCOPY     . HX COLONOSCOPY     . HX HEART CATHETERIZATION     . [place]    [facility]    . HX HEMICOLECTOMY Right 07/13/2014    Laparoscopic right hemicolectomy for colon cancer   . HX TONSIL AND ADENOIDECTOMY     . HX TONSILLECTOMY     . HX UPPER ENDOSCOPY  07/09/14   . HX WISDOM TEETH EXTRACTION         Allergies   Allergen Reactions   . Allegra  [Fexofenadine]  Other Adverse Reaction (Add comment)     "retain fluid"   . Antihistamines - Alkylamine      Pt states antihistamines cause him to retain fluid      Family History  Family Medical History:     Problem Relation (Age of Onset)    Cirrhosis Brother    Hypertension Mother    Lung Cancer Father          Social History  Denies tobacco, alcohol, or illicit drug use    Medications Prior to Admission     Prescriptions    cholecalciferol, vitamin D3, 1,000 unit Oral Tablet    Take 1,000 Units by mouth Once a day    docusate sodium (COLACE) 100 mg Oral Capsule    Take 1 Cap (100 mg total) by mouth Twice daily    enoxaparin (LOVENOX) 120 mg/0.8 mL Subcutaneous Syringe    0.8 mL (120 mg total) by Subcutaneous route Every 12 hours    eplerenone (INSPRA) 25 mg Oral Tablet    Take 1 Tab (25 mg total) by mouth Once a day    furosemide (LASIX) 40 mg Oral Tablet    Take 1 Tab (40 mg total) by mouth Once a day    levothyroxine (SYNTHROID) 200 mcg Oral Tablet    TAKE ONE TABLET BY MOUTH ONCE DAILY    lisinopril (PRINIVIL) 5 mg Oral Tablet    Take 1 Tab (5 mg total) by mouth Once a day    MAGNESIUM CARBONATE ORAL    Take by mouth Once a day    metoprolol succinate (TOPROL-XL) 100 mg Oral Tablet Sustained Release 24 hr    Take 100 mg by mouth Once a day    vitamin B complex Oral Tablet    Take 1 Tab by mouth Once a day    warfarin (COUMADIN) 5 mg Oral Tablet    Take 1.5 Tabs (7.5 mg total) by mouth Once a day    Patient taking differently:  Take 7.5 mg by mouth Once a day To be adjusted by following provider          Current Facility-Administered Medications:  acetaminophen (TYLENOL) tablet 650 mg Oral Q6H PRN   bisacodyl (DULCOLAX) rectal suppository 10 mg Rectal Once PRN   docusate sodium (  COLACE) capsule 100 mg Oral 2x/day   enoxaparin PF (LOVENOX) 120 mg/0.8 mL SubQ injection 1 mg/kg Subcutaneous Q12H   eplerenone (INSPRA) tablet 25 mg Oral Daily   furosemide (LASIX) tablet 40 mg Oral Daily   HYDROmorphone (DILAUDID) 2  mg/mL injection 0.4 mg Intravenous 2x/day PRN   levothyroxine (SYNTHROID) tablet 200 mcg Oral QAM   magnesium oxide (MAG-OX) tablet 400 mg Oral 2x/day   methocarbamol (ROBAXIN) tablet 500 mg Oral 4x/day PRN   metoprolol succinate (TOPROL-XL) 24 hr extended release tablet 100 mg Oral Daily   NS flush syringe 2 mL Intracatheter Q8HRS   And      NS flush syringe 2-6 mL Intracatheter Q1 MIN PRN   oxyCODONE (ROXICODONE) immediate release tablet 5 mg Oral Q4H PRN   polyethylene glycol (MIRALAX) oral packet 17 g Oral Daily   sennosides-docusate sodium (SENOKOT-S) 8.6-50mg  per tablet 1 Tab Oral NIGHTLY   warfarin (COUMADIN) tablet 7.5 mg Oral NIGHTLY       ROS:  Constitutional: No fevers or chills. No unexplained weight loss. + intermittent dizziness with standing   Skin:  No jaundice, rash, moles, or itching.   Eyes: No eye irritation/discharge or changes in vision.    ENT:  No tinnitus. No hearing loss. No dysphagia  Cardiac: No chest pain, shortness of breath. No palpitations or syncope.   Respiratory: No wheeze. No dyspnea on exertion. No cough. No shortness of breath.  Heme/Lymph: No lymphadenopathy in neck, axilla or groin. No petechia or purpura.  Endocrine: No polyuria, polydipsia, or polyphagia.   GI: No diarrhea or constipation. No heartburn. No nausea or vomiting.  GU: No nocturia  No frequency, urgency, hematuria or dysuria.   Musculoskeletal:  No arthralgias or myalgias; no red hot swollen joints  Neuro: No focal weakness, no loss of sensation,  No changes in speech, gait or coordination. No changes in swallowing. No double vision. No headaches       EXAM:  Temperature: 37.1 C (98.8 F)  Heart Rate: 87  BP (Non-Invasive): 120/81  Respiratory Rate: 17  SpO2: 98 %  General: Alert and Oriented x3, appears stated age, no acute cardiopulmonary distress   HEENT: NC/AT, moist mucus membranes, PERRLA, EOMI, no JVD   Cardiovascular: RRR, S1/S2 audible, no Y6/9, systolic murmur. Pulses are +2/4 bilaterally.   Lungs: CTA  b/l, no rales, rhonchi, or wheezing   Abd: soft, non tender and non distended, no guarding or rebound, normal bowel sounds  Ext: no clubbing, cyanosis, or edema  Neuro: no focal deficits, strength 5/5 bilateral upper and lower extremities  Skin: Warm and dry, no rashes  Psych: Normal mood and affect    Studies:  I have reviewed all available studies within the electronic medical record.    Rushie Nyhan, MD  04/28/2017, 16:49  PGY-3 Internal Medicine  [facility]        I saw and examined the patient.  I reviewed the resident's note.  I agree with the findings and plan of care as documented in the resident's note.  Any exceptions/additions are edited/noted.    Rubbie Battiest, MD 04/28/2017, 17:11,  Hospitalist Medicine  Pager [phone]

## 2017-04-28 NOTE — Care Plan (Signed)
[facility]  Occupational Therapy Progress Note    Patient Name: Kenneth Weeks  Date of Birth: Jul 01, 1950  Height:  190.5 cm (6\' 3" )  Weight:  121.7 kg (268 lb 4.8 oz)  Room/Bed: 830/B  Payor: MEDICARE / Plan: MEDICARE PART A AND B / Product Type: Medicare /     Assessment:    Pt tolerated OT treatment well. Pt participated in ADL transfers, toileting management, & fxnl mobility greater than household distances with use of FWW. Will continue to follow pt. Would recommend home with assist & HH OT when pt medically appropriate for d/c.      Discharge Needs:   Equipment Recommendation: raised toilet seat    The patient presents with mobility limitations due to impaired balance, impaired strength and impaired functional activity tolerance that significantly impair/prevent patient's ability to participate in mobility-related activities of daily living (MRADLs) including  toileting. This functional mobility deficit can be sufficiently resolved with the use of a raised toilet seat   in order to decrease the risk of falls, morbidity, and mortality in performance of these MRADLs.  Patient is able to safely use this assistive device.    Discharge Disposition:  home with assist, home with home health   The above recommendation is based upon the current examination and evaluation performed on this date. As subsequent sessions are completed, recommendations will be updated accordingly.    JUSTIFICATION OF DISCHARGE RECOMMENDATION   Based on current diagnosis, functional performance prior to admission, and current functional performance, this patient requires continued OT services in home with assist, home with home health in order to achieve significant functional improvements.    Plan:   Continue to follow patient according to established plan of care.  The risks/benefits of therapy have been discussed with the patient/caregiver and he/she is in agreement with the established plan of  care.     Subjective & Objective:        04/28/17 0957   Therapist Pager   OT Assigned/ Pager # Ambre Kobayashi [phone]   Rehab Session   Document Type therapy progress note (daily note)   Total OT Minutes: 24   Patient Effort good   Symptoms Noted During/After Treatment fatigue;increased pain   General Information   Patient Profile Reviewed? yes   Medical Lines PIV Line   Respiratory Status room air   Existing Precautions/Restrictions full code;fall precautions   Pre Treatment Status   Pre Treatment Patient Status Patient sitting in bedside chair or w/c;Nurse approved session   Support Present Pre Treatment  Family present  (PT)   Communication Pre Treatment  Nurse   Communication Pre Treatment Comment Pt/RN agreeable to OT consult, seen with professional assist of PT    Mutuality/Individual Preferences   Individualized Care Needs OOB A x1 with FWW   Self-Care   Current Activity Tolerance moderate   Pain Assessment   Pre/Post Treatment Pain Comment 6/10 pain prior to treatment, increased to 8/10 with OOB fxnl activity   Coping/Psychosocial Response Interventions   Plan Of Care Reviewed With patient   Cognitive Assessment/Interventions   Behavior/Mood Observations behavior appropriate to situation, WNL/WFL;alert;cooperative   Orientation Status oriented x 4   Attention WNL/WFL   Follows Commands WNL   Mobility Assessment/Training   Mobility Comment fxnl mobility greater than household distances with FWW & SBA   Transfer Assessment/Treatment   Sit-Stand Independence stand-by assistance   Stand-Sit Independence stand-by assistance   Toilet Transfer Independence stand-by assistance  Administrator, sports Assessment/Training   Position standing;sitting   TOILETING ASSESSED Adjust clothing prior;Adjust clothing after;Perineal hygiene   Independence Level  standby assist   Grooming Assessment/Training   Comment pt declined to wash hands standing at sink    Balance Skill Training   Sitting Balance: Static  good balance   Sitting, Dynamic (Balance) good balance   Sit-to-Stand Balance fair balance   Standing Balance: Static fair + balance   Standing Balance: Dynamic fair balance   Post Treatment Status   Post Treatment Patient Status Patient sitting in bedside chair or w/c   Support Present Post Treatment  Family present  (PT)   Occupational Therapy Clinical Impression   Functional Level at Time of Session Pt tolerated OT treatment well. Pt participated in ADL transfers, toileting management, & fxnl mobility greater than household distances with use of FWW. Will continue to follow pt. Would recommend home with assist & HH OT when pt medically appropriate for d/c.   Anticipated Equipment Needs at Discharge raised toilet seat   Anticipated Discharge Disposition home with assist;home with home health        04/28/17 0957   Therapist Pager   OT Assigned/ Pager # Chamya Hunton [phone]   Rehab Session   Document Type therapy progress note (daily note)   Total OT Minutes: 24   Patient Effort good   Symptoms Noted During/After Treatment fatigue;increased pain   General Information   Patient Profile Reviewed? yes   Medical Lines PIV Line   Respiratory Status room air   Existing Precautions/Restrictions full code;fall precautions   Pre Treatment Status   Pre Treatment Patient Status Patient sitting in bedside chair or w/c;Nurse approved session   Support Present Pre Treatment  Family present  (PT)   Communication Pre Treatment  Nurse   Communication Pre Treatment Comment Pt/RN agreeable to OT consult, seen with professional assist of PT    Mutuality/Individual Preferences   Individualized Care Needs OOB A x1 with FWW   Self-Care   Current Activity Tolerance moderate   Pain Assessment   Pre/Post Treatment Pain Comment 6/10 pain prior to treatment, increased to 8/10 with OOB fxnl activity   Coping/Psychosocial Response Interventions   Plan Of Care Reviewed With patient   Cognitive Assessment/Interventions   Behavior/Mood Observations  behavior appropriate to situation, WNL/WFL;alert;cooperative   Orientation Status oriented x 4   Attention WNL/WFL   Follows Commands WNL   Mobility Assessment/Training   Mobility Comment fxnl mobility greater than household distances with FWW & SBA   Transfer Assessment/Treatment   Sit-Stand Independence stand-by assistance   Stand-Sit Independence stand-by assistance   Toilet Transfer Independence stand-by assistance   Toilet Transfer Assist Device grab bar   Toileting Assessment/Training   Position standing;sitting   TOILETING ASSESSED Adjust clothing prior;Adjust clothing after;Perineal hygiene   Independence Level  standby assist   Grooming Assessment/Training   Comment pt declined to wash hands standing at sink    Balance Skill Training   Sitting Balance: Static good balance   Sitting, Dynamic (Balance) good balance   Sit-to-Stand Balance fair balance   Standing Balance: Static fair + balance   Standing Balance: Dynamic fair balance   Post Treatment Status   Post Treatment Patient Status Patient sitting in bedside chair or w/c   Support Present Post Treatment  Family present  (PT)   Occupational Therapy Clinical Impression   Functional Level at Time of Session Pt tolerated OT treatment well. Pt participated  in ADL transfers, toileting management, & fxnl mobility greater than household distances with use of FWW. Will continue to follow pt. Would recommend home with assist & HH OT when pt medically appropriate for d/c.   Anticipated Equipment Needs at Discharge raised toilet seat   Anticipated Discharge Disposition home with assist;home with home health       Therapist:   Sabino Snipes, MOT, OTR/L   Pager #: [phone]

## 2017-04-28 NOTE — Nurses Notes (Signed)
Notified service that patient's heart rate jumps from 110's to 170's. No call back or orders yet.

## 2017-04-28 NOTE — Care Plan (Signed)
Problem: Adult Inpatient Plan of Care  Goal: Plan of Care Review  Outcome: Ongoing (see interventions/notes)     Problem: Pain Acute  Goal: Optimal Pain Control  Outcome: Ongoing (see interventions/notes)         Pt continues to be vitally stable thus far.  A&O, able to make needs known to staff.  Lungs are clear and diminished on room air.  Telemetry Afib with HR in the upper 90's to as high as 170's while patient is ambulating.  Abdomen is round, distended with hyperactive bowel sounds in all quadrants.  Mid and upper abdominal wound vac remains in place.  PRN Oxy given for pain relief.  Wife remains at bed side.  Call bell within reach.     Pamala Duffel, RN  04/28/2017, 04:51

## 2017-04-28 NOTE — Care Plan (Signed)
[place]  Physical Therapy Progress Note      Patient Name: Kenneth Weeks  Date of Birth: [birth year]  Height:  190.5 cm (6\' 3" )  Weight:  121.7 kg (268 lb 4.8 oz)  Room/Bed: 830/B  Payor: MEDICARE / Plan: MEDICARE PART A AND B / Product Type: Medicare /     Assessment:     Kenneth Weeks progressed to ambulating household distances with FWW and transfering with supervision. He should be safe for return home from a mobilty standpoint, but he would benefit from home health PT to maximize his mobiltiy, balance, and recovery.    Discharge Needs:   Equipment Recommendation: none anticipated(has rollator)  The patient presents with mobility limitations due to impaired balance, impaired strength and impaired functional activity tolerance that significantly impair/prevent patient's ability to participate in mobility-related activities of daily living (MRADLs) including  ambulation and transfers in order to safely complete, toileting, bathing, safely entering/exiting the home, in reasonable time. This functional mobility deficit can be sufficiently resolved with the use of a none anticipated(has rollator) in order to decrease the risk of falls, morbidity, and mortality in performance of these MRADLs.  Patient is able to safely use this assistive device.    Discharge Recommendation: home with home health  JUSTIFICATION OF DISCHARGE RECOMMENDATION   Based on current diagnosis, functional performance prior to admission, and current functional performance, this patient requires continued PT services in home with home health in order to achieve significant functional improvements in these deficit areas: aerobic capacity/endurance, gait, locomotion, and balance, muscle performance. The above recommendation is based upon the current examination and evaluation performed on this date. As subsequent sessions are completed, recommendations will be updated accordingly.      Plan:   Continue to  follow patient according to established plan of care.  The risks/benefits of therapy have been discussed with the patient/caregiver and he/she is in agreement with the established plan of care.     Subjective & Objective:        04/28/17 1001   Rehab Session   Document Type therapy progress note (daily note)   Total PT Minutes: 24   Patient Effort good   General Information   Patient/Family/Caregiver Comments/Observations Resting in bed, pleasant and cooperative. He has been ambulating to the bathroom with staff assist and FWW.   Respiratory Status room air   Existing Precautions/Restrictions full code;fall precautions   Mutuality/Individual Preferences   Individualized Care Needs Amb into hallway TID FWW Ax1   Cognitive Assessment/Interventions   Behavior/Mood Observations behavior appropriate to situation, WNL/WFL   Orientation Status oriented x 4   Attention WNL/WFL   Follows Commands WNL   Pain Assessment   Pain Scale: Numbers, Pretreatment 5/10   Pain Scale: Numbers, Post-Treatment 5/10   Pre/Post Treatment Pain Comment abdominal   Bed Mobility Assessment/Treatment   Bed Mobility, Assistive Device bed rails;Head of Bed Elevated   Supine-Sit Independence supervision required   Transfer Assessment/Treatment   Sit-Stand Independence stand-by assistance   Stand-Sit Independence stand-by assistance   Transfer Comment cues for technique   Gait Assessment/Treatment   Distance in Feet 125 ft with FWW and stand by assist, cues for upright posture, decreased reliance on FWW. Mild decreased gait speed.20 ft with no device in room with CGA, decreased gait speed, no major LOB. Widened BOS.   Balance Skill Training   Sitting Balance: Static good balance   Sitting, Dynamic (Balance) good balance   Sit-to-Stand Balance fair  balance   Standing Balance: Static fair + balance   Standing Balance: Dynamic fair balance   Therapeutic Exercise/Activity   Comment Instructed pt in LAQs and seated marches 3x10 daily   Post Treatment  Status   Post Treatment Patient Status Patient sitting in bedside chair or w/c;Call light within reach;Telephone within reach   Support Present Post Treatment  Family present   Basic Mobility Am-PAC/6Clicks Score   Turning in bed without bedrails 3   Lying on back to sitting on edge of flat bed 3   Moving to and from a bed to a chair 3   Standing up from chair 4   Walk in room 3   Climbing 3-5 steps with railing 3   Raw Score total 19   Standardized (t-scale) score 42.48   CMS 0-100% Score 36.99   CMS Modifier CJ   Physical Therapy Clinical Impression   Assessment Kenneth Weeks progressed to ambulating household distances with FWW and transfering with supervision. He should be safe for return home from a mobilty standpoint, but he would benefit from home health PT to maximize his mobiltiy, balance, and recovery.   Anticipated Equipment Needs at Discharge (PT) none anticipated  (has rollator)   Anticipated Discharge Disposition home with home health       Therapist:   Rayvon Char. Rozann Lesches, PT, DPT   Pager #: [phone]

## 2017-04-28 NOTE — Progress Notes (Signed)
67 y.o. Male with ventral hernia now s/p elective ventral hernia repair with mesh (04/21/17), take back to OR for evacuation of hematoma explantation of prolene mesh (04/23/2017).    NAEO. Denies abd pain. Denies N/V, F/C, CP/SOB, dizziness/lightheadedness. Tolerating diet. Admits flatus/BM.    Filed Vitals:    04/27/17 1936 04/28/17 0012 04/28/17 0441 04/28/17 0730   BP: 115/74 123/73 110/72 94/66   Pulse: 97 87 93 100   Resp:  18 18 18    Temp: 36.7 C (98.1 F) 36.8 C (98.2 F) 36.6 C (97.9 F) 36.5 C (97.7 F)   SpO2: 98% 97% 96% 97%     Gen- NAD, AOx3  CV- RRR  Pulm- non-labored  Abd- ND, incision opened, mild blood saturation on dressing, soft, NT  Ext- no cyanosis or edema    I have reviewed all lab results.  Lab Results Today:    Results for orders placed or performed during the hospital encounter of 04/21/17 (from the past 24 hour(s))   BASIC METABOLIC PANEL   Result Value Ref Range    SODIUM 131 (L) 136 - 145 mmol/L    POTASSIUM 4.2 3.5 - 5.1 mmol/L    CHLORIDE 98 96 - 111 mmol/L    CO2 TOTAL 28 22 - 32 mmol/L    ANION GAP 5 4 - 13 mmol/L    CALCIUM 8.3 (L) 8.5 - 10.2 mg/dL    GLUCOSE 132 65 - 139 mg/dL    BUN 13 8 - 25 mg/dL    CREATININE 0.85 0.62 - 1.27 mg/dL    BUN/CREA RATIO 15 6 - 22    ESTIMATED GFR >59 >59 mL/min/1.3m^2   MAGNESIUM   Result Value Ref Range    MAGNESIUM 2.0 1.6 - 2.5 mg/dL   PHOSPHORUS   Result Value Ref Range    PHOSPHORUS 3.0 2.3 - 4.0 mg/dL   CBC WITH DIFF   Result Value Ref Range    WBC 7.8 3.5 - 11.0 x10^3/uL    RBC 2.59 (L) 4.06 - 5.63 x10^6/uL    HGB 8.1 (L) 12.5 - 16.3 g/dL    HCT 23.8 (L) 36.7 - 47.0 %    MCV 92.1 78.0 - 100.0 fL    MCH 31.2 27.4 - 33.0 pg    MCHC 33.9 32.5 - 35.8 g/dL    RDW 17.5 (H) 12.0 - 15.0 %    PLATELETS 200 140 - 450 x10^3/uL    MPV 8.1 7.5 - 11.5 fL    NEUTROPHIL % 81 %    LYMPHOCYTE % 8 %    MONOCYTE % 8 %    EOSINOPHIL % 2 %    BASOPHIL % 0 %    NEUTROPHIL # 6.33 1.50 - 7.70 x10^3/uL    LYMPHOCYTE # 0.64 (L) 1.00 - 4.80 x10^3/uL    MONOCYTE #  0.64 0.30 - 1.00 x10^3/uL    EOSINOPHIL # 0.17 0.00 - 0.50 x10^3/uL    BASOPHIL # 0.03 0.00 - 0.20 x10^3/uL     67 y.o. Male with ventral hernia now s/p elective ventral hernia repair with mesh (04/21/17), take back to OR for evacuation of hematoma explantation of prolene mesh (04/23/2017).  -bleeding wound   -Hgb stable   -cont NPWT  -mechanical heart valve   -cont tx Lovenox   -restarting coumadin today  -pain control (tylenol prn, dilaudid prn, robaxin prn, oxycodone prn)  -regular diet  -DVT ppx (SCDs, tx Lovenox, Coumadin)  -OOBA/tc  -PT/OT  -IS  Leida Lauth, MD  04/28/2017, 11:41      Late entry for 04/28/17. I saw and examined the patient.  I reviewed the resident's note.  I agree with the findings and plan of care as documented in the resident's note.  Any exceptions/additions are edited/noted.    Marilynne Halsted, MD

## 2017-04-28 NOTE — Care Management Notes (Signed)
[facility]  Care Management Note    Patient Name: Kenneth Weeks  Date of Birth: Apr 29, 1950  Sex: male  Date/Time of Admission: 04/21/2017 10:13 AM  Room/Bed: 830/B  Payor: MEDICARE / Plan: MEDICARE PART A AND B / Product Type: Medicare /    LOS: 7 days   Primary Care Providers:  Viona Gilmore, MD, MD (General)  Viona Gilmore, MD, MD (Claims Attributed)    Admitting Diagnosis:  Ventral hernia [K43.9]    Assessment:      04/28/17 0850   Assessment Details   Assessment Type Continued Assessment   Date of Care Management Update 04/28/17   Patient Hand-Off   Clinical/Discharge Plan of Care Information Communicated to:  Clinical Care Coordinator   Comments Handoff given to Lamonte Richer, RN       Discharge Plan:  Home vs home with Home Health       The patient will continue to be evaluated for developing discharge needs.     Case Manager: Theodis Aguas  Phone: [phone]

## 2017-04-29 LAB — CBC WITH DIFF
BASOPHIL #: 0.04 10*3/uL (ref 0.00–0.20)
EOSINOPHIL #: 0.23 10*3/uL (ref 0.00–0.50)
EOSINOPHIL %: 3 %
HCT: 23.1 % — ABNORMAL LOW (ref 36.7–47.0)
HGB: 7.9 g/dL — ABNORMAL LOW (ref 12.5–16.3)
LYMPHOCYTE #: 0.92 10*3/uL — ABNORMAL LOW (ref 1.00–4.80)
LYMPHOCYTE %: 12 %
MCH: 31.1 pg (ref 27.4–33.0)
MCHC: 34.1 g/dL (ref 32.5–35.8)
MONOCYTE #: 0.6 10*3/uL (ref 0.30–1.00)
MONOCYTE %: 8 %
NEUTROPHIL #: 5.91 10*3/uL (ref 1.50–7.70)
PLATELETS: 202 10*3/uL (ref 140–450)
RBC: 2.53 10*6/uL — ABNORMAL LOW (ref 4.06–5.63)
WBC: 7.7 10*3/uL (ref 3.5–11.0)

## 2017-04-29 LAB — BASIC METABOLIC PANEL
ANION GAP: 6 mmol/L (ref 4–13)
BUN/CREA RATIO: 20 (ref 6–22)
CALCIUM: 8.5 mg/dL (ref 8.5–10.2)
CREATININE: 0.81 mg/dL (ref 0.62–1.27)
ESTIMATED GFR: >59 mL/min/{1.73_m2} (ref >59)
GLUCOSE: 121 mg/dL (ref 65–139)
POTASSIUM: 4.2 mmol/L (ref 3.5–5.1)
SODIUM: 130 mmol/L — ABNORMAL LOW (ref 136–145)

## 2017-04-29 LAB — PT/INR: INR: 1.17 (ref 0.80–1.20)

## 2017-04-29 LAB — MAGNESIUM: MAGNESIUM: 2 mg/dL (ref 1.6–2.5)

## 2017-04-29 MED ORDER — METOPROLOL TARTRATE 5 MG/5 ML INTRAVENOUS SOLUTION
5.0000 mg | Freq: Once | INTRAVENOUS | Status: AC
Start: 2017-04-29 — End: 2017-04-29
  Administered 2017-04-29 (×2): 5 mg via INTRAVENOUS
  Filled 2017-04-29: qty 5

## 2017-04-29 MED ORDER — LACTATED RINGERS IV BOLUS
500.0000 mL | INJECTION | Freq: Once | Status: AC
Start: 2017-04-29 — End: 2017-04-29
  Administered 2017-04-29: 500 mL via INTRAVENOUS
  Administered 2017-04-29: 0 mL via INTRAVENOUS

## 2017-04-29 MED ADMIN — lactated Ringers intravenous solution: INTRAVENOUS | @ 16:00:00 | NDC 00338011704

## 2017-04-29 MED ADMIN — dextrose 5 % and 0.45 % sodium chloride intravenous solution: ORAL | @ 21:00:00 | NDC 00338008504

## 2017-04-29 MED ADMIN — electrolyte-A intravenous solution: ORAL | @ 21:00:00 | NDC 00338022104

## 2017-04-29 MED ADMIN — HYDROmorphone (PF) 1 mg/mL injection solution: INTRAVENOUS | @ 16:00:00 | NDC 00409128331

## 2017-04-29 NOTE — Progress Notes (Signed)
Wound #2  Type: Surgical  Location:  Mid abdomen  Length: 6 cm  Width: 4 cm  Depth: 3 cm  Undermining/Tunneling: Yes, bilaterally (not packing)  Wound Base %: 0  Wound Edges: smooth  Drainage amt: Moderate  Serosanguineous drainage with odor Absent  Peri-wd Skin: inflammation just superior to wound from prior wet to dry dressings, improving.    Leida Lauth, MD  04/29/2017, 09:22    Marilynne Halsted, MD  05/02/2017, 01:04

## 2017-04-29 NOTE — Consults (Signed)
[facility]  Medicine Consult  Follow Up Note    Kenneth Weeks, Slusher, 67 y.o. male  Date of Service: 04/29/2017  Date of Birth:  Feb 21, 1950    Hospital Day:  LOS: 8 days     Chief Complaint:  A fib with RVR   Subjective: Overnight wound vac came off. He still endorses dizziness upon standing. Denies any chest pain or SOB.     Current Facility-Administered Medications   Medication Dose Route Frequency Provider Last Rate Last Dose   . acetaminophen (TYLENOL) tablet  650 mg Oral Q6H PRN Leida Lauth, MD   650 mg at 04/27/17 0346   . bisacodyl (DULCOLAX) rectal suppository  10 mg Rectal Once PRN Leida Lauth, MD       . docusate sodium (COLACE) capsule  100 mg Oral 2x/day Para Skeans, MD   100 mg at 04/29/17 1006   . enoxaparin PF (LOVENOX) 120 mg/0.8 mL SubQ injection  1 mg/kg Subcutaneous Q12H Nelma Rothman, MD   120 mg at 04/29/17 0216   . eplerenone (INSPRA) tablet  25 mg Oral Daily Para Skeans, MD   25 mg at 04/29/17 1007   . HYDROmorphone (DILAUDID) 2 mg/mL injection  0.4 mg Intravenous 2x/day PRN Gerilyn Nestle, MD   0.4 mg at 04/28/17 1715   . levothyroxine (SYNTHROID) tablet  200 mcg Oral QAM Para Skeans, MD   200 mcg at 04/29/17 [phone]   . LR bolus infusion 500 mL  500 mL Intravenous Once Leida Lauth, MD       . magnesium oxide (MAG-OX) tablet  400 mg Oral 2x/day Leida Lauth, MD   400 mg at 04/29/17 1006   . methocarbamol (ROBAXIN) tablet  500 mg Oral 4x/day PRN Para Skeans, MD       . metoprolol succinate (TOPROL-XL) 24 hr extended release tablet  100 mg Oral Daily Para Skeans, MD   100 mg at 04/29/17 1007   . NS flush syringe  2 mL Intracatheter Eliberto Ivory, MD   2 mL at 04/29/17 6759    And   . NS flush syringe  2-6 mL Intracatheter Q1 MIN PRN Lenon Oms, MD       . oxyCODONE (ROXICODONE) immediate release tablet  5 mg Oral Q4H PRN Para Skeans, MD   5 mg at 04/29/17 1638   . polyethylene glycol (MIRALAX) oral packet  17 g Oral Daily Leida Lauth, MD   17 g at  04/29/17 1006   . sennosides-docusate sodium (SENOKOT-S) 8.6-50mg  per tablet  1 Tab Oral NIGHTLY Para Skeans, MD   1 Tab at 04/28/17 2051   . warfarin (COUMADIN) tablet  7.5 mg Oral NIGHTLY Leida Lauth, MD   7.5 mg at 04/28/17 2051     Objective:  Temperature: 36.3 C (97.3 F)  Heart Rate: 92  BP (Non-Invasive): 98/63  Respiratory Rate: 18  SpO2: 98 %  Pain Score (Numeric, Faces): 6  General: Alert and Oriented x3, appears stated age, no acute cardiopulmonary distress   HEENT: NC/AT, moist mucus membranes, PERRLA, EOMI, no JVD   Cardiovascular: RRR, S1/S2 audible, no G6/6, systolic murmur. Pulses are +2/4 bilaterally.   Lungs: CTA b/l, no rales, rhonchi, or wheezing   Abd: soft, non tender and non distended, no guarding or rebound, normal bowel sounds  Ext: no clubbing, cyanosis, or edema  Neuro: no focal deficits, strength 5/5 bilateral upper and lower extremities  Skin: Warm and dry, no rashes  Psych: Normal mood and affect      Labs: All labs reviewed.     Imaging Studies: All imaging reviewed.     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 Medicine was consulted for any further recommendations to optimize his management of his atrial fibrillation given his new tachycardia.     Atrial fibrillation  - CHADS Vasc score 2  - pt has intermittent dizziness with standing. Recommend holding home lasix for now and giving a gentle bolus of 500 cc of NS given over 2hrs. Given recent surgery and blood loss, hesitant at this point of increasing his home Toprol without making sure that dehydration is not contributing to his tachycardia.   - continue Lovenox and warfarin    - regarding discharge planning, the patient can be discharged on  reduced dose of Lasix with close follow-up with his primary care physician and/or cardiologist.  He should be advised that if he has significant peripheral edema that he should call his doctor and possibly go up on his dose.    Thank you for the consult. We will sign off at this time. Please call with any questions or concerns.     Rushie Nyhan, MD  04/29/2017, 10:10  PGY-3 Internal Medicine  [facility]      I saw and examined the patient.  I reviewed the resident's note.  I agree with the findings and plan of care as documented in the resident's note.  Any exceptions/additions are edited/noted.    Rubbie Battiest, MD 04/29/2017, 11:27,  Hospitalist Medicine  Pager [phone]

## 2017-04-29 NOTE — Care Plan (Signed)
Problem: Adult Inpatient Plan of Care  Goal: Plan of Care Review  Outcome: Ongoing (see interventions/notes)     Problem: Adult Inpatient Plan of Care  Goal: Patient-Specific Goal (Individualization)  Outcome: Ongoing (see interventions/notes)     Problem: Fall Injury Risk  Goal: Absence of Fall and Fall-Related Injury  Outcome: Ongoing (see interventions/notes)     Problem: Pain Acute  Goal: Optimal Pain Control  Outcome: Ongoing (see interventions/notes)    Pain intervention administered as requested . No complains the whole shift so far. Kept rested with spouse at bedside. Safety round checks done hourly basis. Placed the call bell within reach.

## 2017-04-29 NOTE — Care Plan (Signed)
Will be hemodynamically stable as evidenced by VS.    Will have relief of pain as evidenced by pt report.

## 2017-04-29 NOTE — Progress Notes (Signed)
67 y.o. Male with ventral hernia now s/p elective ventral hernia repair with mesh (04/21/17), take back to OR for evacuation of hematoma explantation of prolene mesh (04/23/2017).    NAEO. Complains of moderate pain to the right of the NPWT. Denies N/V, F/C, CP/SOB, dizziness/lightheadedness. Tolerating diet. Admits flatus/BM.    Filed Vitals:    04/29/17 0006 04/29/17 0326 04/29/17 0415 04/29/17 0800   BP: 98/61 100/62  98/63   Pulse: 96 92  92   Resp: 18 18  18    Temp: 36.7 C (98.1 F) 36.7 C (98.1 F)  36.3 C (97.3 F)   SpO2:   98%      Gen- NAD, AOx3  CV- RRR  Pulm- non-labored  Abd- ND, ecchymosis throughout the lower abdomen stable, NPWT intact w/ good seal, soft, moderately tender to right of NPWT w/ mild amount of induration  Ext- no cyanosis or edema    I have reviewed all lab results.  Lab Results Today:    Results for orders placed or performed during the hospital encounter of 04/21/17 (from the past 24 hour(s))   ECG 12-LEAD   Result Value Ref Range    Ventricular rate 130 BPM    Atrial Rate 182 BPM    QRS Duration 94 ms    QT Interval 274 ms    QTC Calculation 403 ms    Calculated R Axis 15 degrees    Calculated T Axis -171 degrees   BASIC METABOLIC PANEL   Result Value Ref Range    SODIUM 130 (L) 136 - 145 mmol/L    POTASSIUM 4.2 3.5 - 5.1 mmol/L    CHLORIDE 97 96 - 111 mmol/L    CO2 TOTAL 27 22 - 32 mmol/L    ANION GAP 6 4 - 13 mmol/L    CALCIUM 8.5 8.5 - 10.2 mg/dL    GLUCOSE 121 65 - 139 mg/dL    BUN 16 8 - 25 mg/dL    CREATININE 0.81 0.62 - 1.27 mg/dL    BUN/CREA RATIO 20 6 - 22    ESTIMATED GFR >59 >59 mL/min/1.38m^2   MAGNESIUM   Result Value Ref Range    MAGNESIUM 2.0 1.6 - 2.5 mg/dL   PHOSPHORUS   Result Value Ref Range    PHOSPHORUS 3.5 2.3 - 4.0 mg/dL   CBC WITH DIFF   Result Value Ref Range    WBC 7.7 3.5 - 11.0 x10^3/uL    RBC 2.53 (L) 4.06 - 5.63 x10^6/uL    HGB 7.9 (L) 12.5 - 16.3 g/dL    HCT 23.1 (L) 36.7 - 47.0 %    MCV 91.1 78.0 - 100.0 fL    MCH 31.1 27.4 - 33.0 pg    MCHC 34.1 32.5  - 35.8 g/dL    RDW 17.8 (H) 12.0 - 15.0 %    PLATELETS 202 140 - 450 x10^3/uL    MPV 7.9 7.5 - 11.5 fL    NEUTROPHIL % 77 %    LYMPHOCYTE % 12 %    MONOCYTE % 8 %    EOSINOPHIL % 3 %    BASOPHIL % 1 %    NEUTROPHIL # 5.91 1.50 - 7.70 x10^3/uL    LYMPHOCYTE # 0.92 (L) 1.00 - 4.80 x10^3/uL    MONOCYTE # 0.60 0.30 - 1.00 x10^3/uL    EOSINOPHIL # 0.23 0.00 - 0.50 x10^3/uL    BASOPHIL # 0.04 0.00 - 0.20 x10^3/uL   PT/INR   Result Value Ref Range  PROTHROMBIN TIME 13.8 9.5 - 14.1 seconds    INR 1.17 0.80 - 1.20     67 y.o. Male with ventral hernia now s/p elective ventral hernia repair with mesh (04/21/17), take back to OR for evacuation of hematoma explantation of prolene mesh (04/23/2017).  -bleeding wound   -Hgb stable   -cont NPWT (change today)  -mechanical heart valve   -cont tx Lovenox   -bridging back to coumadin  -pain control (tylenol prn, dilaudid prn, robaxin prn, oxycodone prn)  -regular diet  -DVT ppx (SCDs, tx Lovenox, Coumadin)  -OOBA/tc  -PT/OT  -IS  -possibly ready for discharge to home tomorrow      Leida Lauth, MD  04/29/2017, 08:57        Late entry for 04/29/17. I saw and examined the patient.  I reviewed the resident's note.  I agree with the findings and plan of care as documented in the resident's note.  Any exceptions/additions are edited/noted.    Marilynne Halsted, MD

## 2017-04-29 NOTE — Nurses Notes (Signed)
Pt with elevated HR (afib) throughout day.  Associated mostly with exertion.  Lopressor prior with little effect.  Service paged.Marland Kitchen

## 2017-04-29 NOTE — Care Management Notes (Signed)
[facility]  Care Management Note    Patient Name: Kenneth Weeks  Date of Birth: 21-Nov-1950  Sex: male  Date/Time of Admission: 04/21/2017 10:13 AM  Room/Bed: 830/B  Payor: MEDICARE / Plan: MEDICARE PART A AND B / Product Type: Medicare /    LOS: 8 days   Primary Care Providers:  Viona Gilmore, MD, MD (General)  Viona Gilmore, MD, MD (Claims Attributed)    Admitting Diagnosis:  Ventral hernia [K43.9]    Assessment:      04/29/17 1243   Assessment Details   Assessment Type Continued Assessment   Date of Care Management Update 04/29/17   Date of Next DCP Update 04/30/17   Medicare Intent to Discharge Documentation   Discharge IMM give to: Patient   Discharge IMM Letter Given Date 04/29/17   Discharge IMM Letter Given Time 1241   IMM explained/reviewed with:  Patient;verbalized understanding   Care Management Plan   Discharge Planning Status plan in progress   Projected Discharge Date 04/30/17   Patient choice offered to patient/family yes   Form for patient choice reviewed/signed and on chart yes   Facility or [place] and [facility]   Discharge Needs Assessment   Outpatient/Agency/Support Group Needs homecare agency   Equipment Needed After Discharge wound care supplies;other (see comments)  (wound vac)   Discharge Facility/Level of Care Needs Home with Home Health and DME (code 6)   Transportation Available family or friend will provide       Discharge Plan:  Home with [place] and DME (code 6)  Patient to be discharged to home with home health and a wound vac. Freedom of CHOICE has been obtained for [facility] [phone] and [facility] [phone]. [facility] has accepted the patient and [facility] wound vac has been approved. Plan for discharge 03/22; family will be providing transportation home.     IMM has been obtained.    The patient will continue to be evaluated for developing discharge needs.     Case Manager: Justice Rocher, RN  Phone:  [phone]

## 2017-04-29 NOTE — Nurses Notes (Signed)
HR to 180's despite lopressor.  Service texted.  Awaiting orders.

## 2017-04-29 NOTE — Nurses Notes (Signed)
Elevated HR reported to service.  Orders receiced.

## 2017-04-29 NOTE — Care Plan (Signed)
Discharge Plan:  Home with Home Health and DME (code 6)  Patient to be discharged to home with home health and a wound vac. Freedom of CHOICE has been obtained for [facility] [phone] and [facility] [phone]. [facility] has accepted the patient and [facility] wound vac has been approved. Plan for discharge 03/22; family will be providing transportation home.     IMM has been obtained.    The patient will continue to be evaluated for developing discharge needs.     Case Manager: Justice Rocher, RN  Phone: [phone]

## 2017-04-29 NOTE — Care Plan (Signed)
[place]  Physical Therapy Progress Note      Patient Name: Kenneth Weeks  Date of Birth: [birth year]  Height:  190.5 cm (6\' 3" )  Weight:  121.7 kg (268 lb 4.8 oz)  Room/Bed: 830/B  Payor: MEDICARE / Plan: MEDICARE PART A AND B / Product Type: Medicare /     Assessment:     Kenneth Weeks progressed with improved gait quality, endurance, and balance this session. He is transferring and ambulating adequately for return home from a mobility standpoint.    Discharge Needs:   Equipment Recommendation: none anticipated  The patient presents with mobility limitations due to impaired balance, impaired strength and impaired functional activity tolerance that significantly impair/prevent patient's ability to participate in mobility-related activities of daily living (MRADLs) including  ambulation and transfers in order to safely complete, toileting, bathing, safely entering/exiting the home, in reasonable time. This functional mobility deficit can be sufficiently resolved with the use of a none anticipated in order to decrease the risk of falls, morbidity, and mortality in performance of these MRADLs.  Patient is able to safely use this assistive device.    Discharge Recommendation: home with home health  JUSTIFICATION OF DISCHARGE RECOMMENDATION   Based on current diagnosis, functional performance prior to admission, and current functional performance, this patient requires continued PT services in home with home health in order to achieve significant functional improvements in these deficit areas: aerobic capacity/endurance, gait, locomotion, and balance, muscle performance. The above recommendation is based upon the current examination and evaluation performed on this date. As subsequent sessions are completed, recommendations will be updated accordingly.      Plan:   Continue to follow patient according to established plan of care.  The risks/benefits of therapy have been discussed  with the patient/caregiver and he/she is in agreement with the established plan of care.     Subjective & Objective:        04/29/17 1045   Rehab Session   Document Type therapy progress note (daily note)   Total PT Minutes: 19   Patient Effort good   General Information   Patient/Family/Caregiver Comments/Observations Resting in bed, pleasant and cooperative   Respiratory Status room air   Mutuality/Individual Preferences   Individualized Care Needs Amb hall TID FWW Ax1   Cognitive Assessment/Interventions   Behavior/Mood Observations behavior appropriate to situation, WNL/WFL   Orientation Status oriented x 4   Attention WNL/WFL   Follows Commands WNL   Pain Assessment   Pain Scale: Numbers, Pretreatment 4/10   Pain Scale: Numbers, Post-Treatment 4/10   Bed Mobility Assessment/Treatment   Supine-Sit Independence supervision required   Transfer Assessment/Treatment   Sit-Stand Independence supervision required   Stand-Sit Independence supervision required   Gait Assessment/Treatment   Distance in Feet 175 ft with FWW and supervision, cues for technique and posture. 75 ft with no device. No major LOB or unsteadiness.   Balance Skill Training   Sitting Balance: Static good balance   Sitting, Dynamic (Balance) good balance   Sit-to-Stand Balance fair balance   Standing Balance: Static fair + balance   Standing Balance: Dynamic fair balance   Therapeutic Exercise/Activity   Comment Reviewed activity recommendations   Post Treatment Status   Post Treatment Patient Status Patient sitting on edge of bed;Call light within reach;Telephone within reach   Support Present Post Treatment  Family present   Basic Mobility Am-PAC/6Clicks Score   Turning in bed without bedrails 3   Lying on back  to sitting on edge of flat bed 3   Moving to and from a bed to a chair 4   Standing up from chair 4   Walk in room 4   Climbing 3-5 steps with railing 3   Raw Score total 21   Standardized (t-scale) score 45.55   CMS 0-100% Score 29.52      CMS Modifier CJ   Physical Therapy Clinical Impression   Assessment Kenneth Weeks progressed with improved gait quality, endurance, and balance this session. He is transferring and ambulating adequately for return home from a mobility standpoint.   Anticipated Equipment Needs at Discharge (PT) none anticipated   Anticipated Discharge Disposition home with home health       Therapist:   Rayvon Char. Rozann Lesches, PT, DPT   Pager #: [phone]

## 2017-04-30 LAB — BASIC METABOLIC PANEL
ANION GAP: 8 mmol/L (ref 4–13)
BUN/CREA RATIO: 15 (ref 6–22)
BUN: 14 mg/dL (ref 8–25)
CALCIUM: 8.8 mg/dL (ref 8.5–10.2)
CHLORIDE: 96 mmol/L (ref 96–111)
CO2 TOTAL: 24 mmol/L (ref 22–32)
CREATININE: 0.93 mg/dL (ref 0.62–1.27)
ESTIMATED GFR: >59 mL/min/1.73mˆ2 (ref >59)
GLUCOSE: 147 mg/dL — ABNORMAL HIGH (ref 65–139)
POTASSIUM: 4.4 mmol/L (ref 3.5–5.1)
SODIUM: 128 mmol/L — ABNORMAL LOW (ref 136–145)

## 2017-04-30 LAB — CBC WITH DIFF
BASOPHIL #: 0.06 x10ˆ3/uL (ref 0.00–0.20)
BASOPHIL %: 1 %
EOSINOPHIL #: 0.2 x10ˆ3/uL (ref 0.00–0.50)
EOSINOPHIL %: 2 %
HCT: 25 % — ABNORMAL LOW (ref 36.7–47.0)
HGB: 8.3 g/dL — ABNORMAL LOW (ref 12.5–16.3)
LYMPHOCYTE #: 0.71 x10?3/uL — ABNORMAL LOW (ref 1.00–4.80)
LYMPHOCYTE #: 0.71 x10ˆ3/uL — ABNORMAL LOW (ref 1.00–4.80)
LYMPHOCYTE %: 7 %
MCH: 30.7 pg (ref 27.4–33.0)
MCHC: 33.4 g/dL (ref 32.5–35.8)
MCV: 91.9 fL (ref 78.0–100.0)
MONOCYTE #: 0.84 x10ˆ3/uL (ref 0.30–1.00)
MONOCYTE %: 8 %
MPV: 7.9 fL (ref 7.5–11.5)
NEUTROPHIL #: 8.66 x10ˆ3/uL — ABNORMAL HIGH (ref 1.50–7.70)
NEUTROPHIL %: 83 %
PLATELETS: 238 x10ˆ3/uL (ref 140–450)
RBC: 2.72 x10ˆ6/uL — ABNORMAL LOW (ref 4.06–5.63)
RDW: 17.9 % — ABNORMAL HIGH (ref 12.0–15.0)
WBC: 10.5 x10ˆ3/uL (ref 3.5–11.0)

## 2017-04-30 LAB — PT/INR
INR: 1.14 (ref 0.80–1.20)
PROTHROMBIN TIME: 13.4 seconds (ref 9.5–14.1)

## 2017-04-30 LAB — PHOSPHORUS: PHOSPHORUS: 3.4 mg/dL (ref 2.3–4.0)

## 2017-04-30 LAB — MAGNESIUM: MAGNESIUM: 1.9 mg/dL (ref 1.6–2.5)

## 2017-04-30 MED ORDER — ACETAMINOPHEN 325 MG TABLET
650.00 mg | ORAL_TABLET | Freq: Four times a day (QID) | ORAL | 0 refills | Status: DC | PRN
Start: 2017-04-30 — End: 2017-10-01

## 2017-04-30 MED ORDER — OXYCODONE 5 MG TABLET
5.0000 mg | ORAL_TABLET | ORAL | 0 refills | Status: DC | PRN
Start: 2017-04-30 — End: 2017-09-06

## 2017-04-30 MED ORDER — METHOCARBAMOL 500 MG TABLET
500.00 mg | ORAL_TABLET | Freq: Four times a day (QID) | ORAL | 0 refills | Status: DC | PRN
Start: 2017-04-30 — End: 2018-01-17

## 2017-04-30 MED ADMIN — HEPARIN 1 UNIT/ML IN NS 50 ML PEDS FLUSH: ORAL | @ 10:00:00

## 2017-04-30 MED ADMIN — sodium chloride 0.9 % intravenous solution: ORAL | @ 07:00:00 | NDC 00338004904

## 2017-04-30 MED ADMIN — sodium chloride 0.9 % (flush) injection syringe: ORAL | @ 10:00:00

## 2017-04-30 NOTE — Nurses Notes (Signed)
Case manager reported to nursing that she was going to get the patient's home wound vac. Discharge order present. PIV discontinued. Patient's spouse at bedside. Reviewed discharge instructions with patient and spouse. All questions answered by bedside nurse. Patient to be assisted to front entrance via wheelchair with transport assistance. Will continue to monitor until patient departure.  Rudie Meyer, RN  04/30/2017, 11:23

## 2017-04-30 NOTE — Discharge Summary (Signed)
[facility]  DISCHARGE SUMMARY    PATIENT NAME:  Kenneth Weeks, Kenneth Weeks  MRN:  [id number]  DOB:  [birth year]    ENCOUNTER DATE:  04/21/2017  INPATIENT ADMISSION DATE: 04/21/2017  DISCHARGE DATE:  04/30/2017    ATTENDING PHYSICIAN: Leticia Clas, MD  SERVICE: GENERAL SURGERY GOLD  PRIMARY CARE PHYSICIAN: Viona Gilmore, MD         LAY CAREGIVER:  ,  ,        PRIMARY DISCHARGE DIAGNOSIS:   Active Hospital Problems    Diagnosis Date Noted   . Ventral hernia 04/21/2017      Resolved Hospital Problems   No resolved problems to display.     Active Non-Hospital Problems    Diagnosis Date Noted   . Ventral hernia with obstruction and without gangrene 03/23/2017   . Diabetes (CMS [place]) 03/05/2016   . Valvular disease    . Abdominal hernia    . Chronic anticoagulation 10/07/2015   . History of colon cancer, stage I 10/07/2015   . Ventral incisional hernia 10/07/2015   . Left inguinal hernia 10/07/2015   . Pulmonary nodules 08/30/2015   . Colon cancer (CMS [place]) 07/13/2014   . Cirrhosis of liver (CMS HCC) 07/13/2014   . Edema of both legs 07/13/2014   . Marfan syndrome 07/10/2014   . Hypertension 07/10/2014   . Congestive heart failure (CMS HCC) 07/10/2014   . History of mechanical aortic valve replacement 07/10/2014   . Hypothyroidism 07/10/2014   . Chronic atrial fibrillation (CMS HCC) 07/10/2014   . Monitoring for long-term anticoagulant use 05/30/2014   . Hydrocele 07/30/2010        DISCHARGE MEDICATIONS:     Current Discharge Medication List      START taking these medications.      Details   acetaminophen 325 mg Tablet  Commonly known as:  TYLENOL   650 mg, Oral, EVERY 6 HOURS PRN  Refills:  0     methocarbamol 500 mg Tablet  Commonly known as:  ROBAXIN   500 mg, Oral, 4 TIMES DAILY PRN  Qty:  30 Tab  Refills:  0     oxyCODONE 5 mg Tablet  Commonly known as:  ROXICODONE   5 mg, Oral, EVERY 4 HOURS PRN  Qty:  20 Tab  Refills:  0        CONTINUE these medications which have CHANGED during your visit.      Details      warfarin 5 mg Tablet  Commonly known as:  COUMADIN  What changed:  additional instructions   7.5 mg, Oral, DAILY  Qty:  140 Tab  Refills:  4        CONTINUE these medications - NO CHANGES were made during your visit.      Details   cholecalciferol (vitamin D3) 1,000 unit Tablet   1,000 Units, Oral, DAILY  Refills:  0     docusate sodium 100 mg Capsule  Commonly known as:  COLACE   100 mg, Oral, 2 TIMES DAILY  Refills:  0     enoxaparin 120 mg/0.8 mL Syringe  Commonly known as:  LOVENOX   120 mg, Subcutaneous, EVERY 12 HOURS  Qty:  10 Syringe  Refills:  2     eplerenone 25 mg Tablet  Commonly known as:  INSPRA   25 mg, Oral, DAILY  Qty:  90 Tab  Refills:  1     levothyroxine 200 mcg Tablet  Commonly known as:  SYNTHROID   TAKE ONE TABLET BY MOUTH ONCE DAILY  Qty:  90 Tab  Refills:  3     lisinopril 5 mg Tablet  Commonly known as:  PRINIVIL   5 mg, Oral, DAILY  Qty:  90 Tab  Refills:  1     MAGNESIUM CARBONATE ORAL   Oral, DAILY  Refills:  0     metoprolol succinate 100 mg Tablet Sustained Release 24 hr  Commonly known as:  TOPROL-XL   100 mg, Oral, DAILY  Refills:  0     vitamin B complex Tablet   1 Tab, Oral, DAILY  Refills:  0        STOP taking these medications.    furosemide 40 mg Tablet  Commonly known as:  LASIX          Discharge med list refreshed?  YES    ALLERGIES:  Allergies   Allergen Reactions   . Allegra [Fexofenadine]  Other Adverse Reaction (Add comment)     "retain fluid"   . Antihistamines - Alkylamine      Pt states antihistamines cause him to retain fluid                HOSPITAL PROCEDURE(S):   Bedside Procedures:  No orders of the defined types were placed in this encounter.    Surgical Procedure(s):  IRRIGATION AND DEBRIDEMENT ABDOMEN  EVACUATION HEMATOMA ABDOMINAL  CHANGE/REMOVAL WOUND VAC    REASON FOR HOSPITALIZATION AND HOSPITAL COURSE     BRIEF HPI:  This is a 67 y.o., male with ventral hernia presenting for elective hernia repair. Patient underwent open ventral hernia repair with mesh  and tolerated procedure without complications. Patient was admitted post-operatively for observation and to begin bridging to warfarin with Lovenox. Patient developed an abdominal wall hematoma that required return to the OR for evacuation, explantation of mesh and ligation of bleeding vessel. Patient then recovered appropriately with stabilization of hemoglobin after 2 units of blood transfused. The open abdominal wound was transitioned to a wound vac. Patient is now tolerating pain and diet without issues. Patient will finish bridge to warfarin at home with Lovenox shots that he has decided to pay for out of pocket. Patient is medically/surgically cleared for discharge to home with follow up in 2 weeks.        CONDITION ON DISCHARGE:  A. Ambulation: Full ambulation  B. Self-care Ability: Complete  C. Cognitive Status Alert and Oriented x 3  D. Code status at discharge:   Code Status Information     Code Status    Full Code                 LINES/DRAINS/WOUNDS AT DISCHARGE:   Patient Lines/Drains/Airways Status    Active Line / Dialysis Catheter / Dialysis Graft / Drain / Airway / Wound     Name: Placement date: Placement time: Site: Days:    Peripheral IV Right;Lower Cephalic  (lateral side of arm)  04/25/17   1725   4    Wound Vac Medial;Upper Abdomen  04/27/17   1331   2    Surgical Incision Mid Abdomen  04/21/17   1301   8                DISCHARGE DISPOSITION:  Home discharge              DISCHARGE INSTRUCTIONS:  [facility] Physician Office [facility]tr .    Specialty:  General Surgery  Contact information:  [address]  [place] [address]  [phone]  Additional information:  For driving directions to the [facility] in [place], please call [phone]-[phone]). You may also visit our website at [email or web address]*Valet parking is available to patients at [facility] outpatient clinics for free and tipping is not  required.*Visitors to our main campus will Location manager as we are expanding to better serve you. We apologize for any inconvenience this may cause and appreciate your patience.                  Refer to [facility]ET     Diet: RESUME HOME DIET      DISCHARGE INSTRUCTION - ACTIVITY - NO LIFTING GREATER THAN 10 POUNDS     Activity: NO LIFTING OVER 10 POUNDS      DISCHARGE INSTRUCTION - INCISION/WOUND CARE    Wound vac to be changed by home health nursing     Instructions for incision/wound care: Wound Care as Instructed      DISCHARGE INSTRUCTION - GOLD    POST-OPERATIVE WOUND CARE   From the day of placement, the stiches/staples on the wound will be removed in 10-14 days. You will have an appointment with your surgeon for the removal.   [place] the wounds at least once a day with warm, soapy water. You may do this easily in the shower. No swimming or submerging the wound, like in a pool, bathtub or hot tub.  Pat the wound dry. DO NOT RUB!  If you choose to use an antibiotic ointment, (i.e. Bacitracin or Neosporin), be sure to remove all the old ointment before putting on more.  Please refer to your diet and activity instructions. You are on these restrictions until your clinic appointment(s) and evaluation of your healing.  SEEK MEDICAL ATTENTION IF:  There is redness, swelling or increased pain in the wound that is not controlled with pain medications as prescribed.  There is drainage, blood or pus coming from the wound lasting longer than 1 day, or sooner if there is a concern.  You develop signs of a generalized infection including muscle aches, chills, fever or a general ill feeling.  You notice a foul smell coming from the wound or dressing.  You developed persistent nausea or vomiting. and HERNIA REPAIR   A hernia occurs when inner layers of abdominal muscle become weakened. The lining of the abdomen then bulges out into a small sac, and part  of the intestine or abdominal tissue may enter the sac. Hernias occur most commonly in the groin (inguinal hernia), the navel (umbilical hernia), and at the incision site of a previous surgery.    Some people with hernias remain relatively free from symptoms. But a hernia can cause severe pain and other potentially serious problems (e.g., infection, bowel obstruction). Surgery is the only way to repair them, because they do not resolve on their own.  DAILY CARE  Keep your wound dry and clean. Please refer to the stiches/ staples and wound care instructions for more details.   Ice packs can be applied to the operative site and may help with discomfort/pain and keep swelling down. After 1-2 days a heating pad may be applied for a maximum of 15 minutes 4 times a day for additional comfort, or as directed by your care-provider.  Please refer to your diet and activity instructions.  You are on these restrictions until your clinic appointment(s) and evaluation of your healing. There should be no heavy lifting, of more than 10 pounds (1 gallon of milk), until you return for your clinic appointment and your care-provider has changed your activity/restrictions. This is generally 3-4 weeks.  You may not feel like eating. If there is a food that makes you sick to your stomach, do not eat it. Eat small meals 4-6 times a day instead of 3 large ones.  Cough gently. Even the best hernia repair can break with the continual strain of coughing.  SEEK MEDICAL ATTENTION IF:   There is redness, swelling or increased pain at the incision site that is not controlled with pain medications as prescribed.  There is breaking open of the wound (edges are not staying together).  There is drainage, blood or pus coming from the incision site lasting longer than 1 day, or sooner if there is a concern.  You develop signs of a generalized infection including muscle aches, chills, fever or a general ill feeling.  You notice a foul smell coming from  the wound or dressing.  You developed persistent nausea or vomiting.     SCHEDULE FOLLOW-UP SURG SPEC - GENERAL - PHYSICIAN OFFICE CENTER     Follow-up in: 2 WEEKS    Reason for visit: POST-OP VISIT    Follow-up reason: hernia repair, hematoma evacuation    Provider: Leticia Clas, MD      DME - NEGATIVE PRESSURE WOUND THERAPY     Ht 190.5 cm    Wt 121.7 kg    Current Attending: Leticia Clas, MD    Medical Condition or Diagnosis which is primary reason for equipment: Abdominal wound    Start Date 04/30/2017    Negative Pressure -159mmHg    Dressing Change Schedule Tues/Thurs/Sat may switch to MWF if more convienent          Leida Lauth, MD      Copies sent to Care Team       Relationship Specialty Notifications Start End    Viona Gilmore, MD PCP - [facility]  05/11/14     Phone: [phone] Fax: [phone]         [address] [place] [address]    Viona Gilmore, MD PCP - Claims Attributed   11/09/14     Set automatically for Most Visit Over 18 Months    Phone: [phone] Fax: [phone]         [address] [facility] [address]          Referring providers can utilize [email or web address] to access their referred [facility] patient's information.                  I saw and examined the patient.  I reviewed the resident's note.  I agree with the findings and plan of care as documented in the resident's note.  Any exceptions/additions are edited/noted.    Leticia Clas, MD

## 2017-04-30 NOTE — Progress Notes (Signed)
67 y.o. Male with ventral hernia now s/p elective ventral hernia repair with mesh (04/21/17), take back to OR for evacuation of hematoma explantation of prolene mesh (04/23/2017).    NAEO. Nursing reported several episodes of increased Afib rate as patient moves, but patient asymptomatic. Complains of moderate pain to the right of the NPWT, improved. Denies N/V, F/C, CP/SOB, dizziness/lightheadedness. Tolerating diet. Admits flatus/BM.    Filed Vitals:    04/29/17 2026 04/29/17 2057 04/29/17 2355 04/30/17 0323   BP: (!) 101/59  100/61 100/60   Pulse: 92  91 97   Resp: 18  18 18    Temp: 36.6 C (97.9 F)  36.4 C (97.5 F) 36.6 C (97.9 F)   SpO2:  98%       Gen- NAD, AOx3  CV- RRR  Pulm- non-labored  Abd- ND, ecchymosis throughout the lower abdomen stable, NPWT intact w/ good seal, soft, moderately tender to right of NPWT w/ mild amount of induration  Ext- no cyanosis or edema    I have reviewed all lab results.  Lab Results Today:    Results for orders placed or performed during the hospital encounter of 04/21/17 (from the past 24 hour(s))   PT/INR   Result Value Ref Range    PROTHROMBIN TIME 13.8 9.5 - 14.1 seconds    INR 1.17 0.80 - 0.17   BASIC METABOLIC PANEL   Result Value Ref Range    SODIUM 128 (L) 136 - 145 mmol/L    POTASSIUM 4.4 3.5 - 5.1 mmol/L    CHLORIDE 96 96 - 111 mmol/L    CO2 TOTAL 24 22 - 32 mmol/L    ANION GAP 8 4 - 13 mmol/L    CALCIUM 8.8 8.5 - 10.2 mg/dL    GLUCOSE 147 (H) 65 - 139 mg/dL    BUN 14 8 - 25 mg/dL    CREATININE 0.93 0.62 - 1.27 mg/dL    BUN/CREA RATIO 15 6 - 22    ESTIMATED GFR >59 >59 mL/min/1.66m^2   MAGNESIUM   Result Value Ref Range    MAGNESIUM 1.9 1.6 - 2.5 mg/dL   PHOSPHORUS   Result Value Ref Range    PHOSPHORUS 3.4 2.3 - 4.0 mg/dL   PT/INR   Result Value Ref Range    PROTHROMBIN TIME 13.4 9.5 - 14.1 seconds    INR 1.14 0.80 - 1.20   CBC WITH DIFF   Result Value Ref Range    WBC 10.5 3.5 - 11.0 x10^3/uL    RBC 2.72 (L) 4.06 - 5.63 x10^6/uL    HGB 8.3 (L) 12.5 - 16.3 g/dL     HCT 25.0 (L) 36.7 - 47.0 %    MCV 91.9 78.0 - 100.0 fL    MCH 30.7 27.4 - 33.0 pg    MCHC 33.4 32.5 - 35.8 g/dL    RDW 17.9 (H) 12.0 - 15.0 %    PLATELETS 238 140 - 450 x10^3/uL    MPV 7.9 7.5 - 11.5 fL    NEUTROPHIL % 83 %    LYMPHOCYTE % 7 %    MONOCYTE % 8 %    EOSINOPHIL % 2 %    BASOPHIL % 1 %    NEUTROPHIL # 8.66 (H) 1.50 - 7.70 x10^3/uL    LYMPHOCYTE # 0.71 (L) 1.00 - 4.80 x10^3/uL    MONOCYTE # 0.84 0.30 - 1.00 x10^3/uL    EOSINOPHIL # 0.20 0.00 - 0.50 x10^3/uL    BASOPHIL # 0.06 0.00 - 0.20  x10^3/uL     67 y.o. Male with ventral hernia now s/p elective ventral hernia repair with mesh (04/21/17), take back to OR for evacuation of hematoma explantation of prolene mesh (04/23/2017).  -bleeding wound   -Hgb stable   -cont NPWT (change today)  -mechanical heart valve   -cont tx Lovenox   -bridging back to coumadin (will pay for home Lovenox so that can go home)  -pain control (tylenol prn, dilaudid prn, robaxin prn, oxycodone prn)  -regular diet  -DVT ppx (SCDs, tx Lovenox, Coumadin)  -OOBA/tc  -PT/OT  -IS  -will discuss with attending possible discharge to home today      Leida Lauth, MD  04/30/2017, 07:15      Late entry for 04/30/17. I saw and examined the patient.  I reviewed the resident's note.  I agree with the findings and plan of care as documented in the resident's note.  Any exceptions/additions are edited/noted.    Marilynne Halsted, MD

## 2017-04-30 NOTE — Nurses Notes (Signed)
Report called to [employer] home health nurse with [facility]. She requested bedside nurse to provide her cell phone number to the patient. Number provided as requested.  Rudie Meyer, RN  04/30/2017, 12:04

## 2017-04-30 NOTE — Discharge Instructions (Addendum)
Discharge Recommendations/ Plan:Discharge YQ:[id number] with Home Health and DME (code 6)      Resources:     [facility] [phone]    [facility] [phone]

## 2017-04-30 NOTE — Care Management Notes (Signed)
[facility]  Care Management Note    Patient Name: Kenneth Weeks  Date of Birth: 18-May-1950  Sex: male  Date/Time of Admission: 04/21/2017 10:13 AM  Room/Bed: 830/B  Payor: MEDICARE / Plan: MEDICARE PART A AND B / Product Type: Medicare /    LOS: 9 days   Primary Care Providers:  Viona Gilmore, MD, MD (General)  Viona Gilmore, MD, MD (Claims Attributed)    Admitting Diagnosis:  Ventral hernia [K43.9]    Assessment:      04/30/17 1147   Assessment Details   Assessment Type Continued Assessment   Date of Care Management Update 04/30/17   Date of Next DCP Update 05/03/17   Care Management Plan   Discharge Planning Status plan in progress   Projected Discharge Date 04/30/17   Facility or [facility]   Discharge Needs Assessment   Outpatient/Agency/Support Group Needs homecare agency   Equipment Needed After Discharge wound care supplies;other (see comments)  (wound vac)   Discharge Facility/Level of Care Needs Home with Home Health and DME (code 6)   Transportation Available family or friend will provide     Discharge Plan:  Home with Home Health and DME (code 6)  Patient to be discharged to home with home health and a wound vac. Freedom of CHOICE has been obtained for [facility] [phone] and [facility] [phone]. [facility] has accepted the patient and [facility] wound vac has been approved. Proof of delivery has been signed and faxed back to [facility] [phone]. Bedside nurse has been updated with discharge plan. Family will be providing transportation home.     IMM has previously been obtained.    The patient will continue to be evaluated for developing discharge needs.     Case Manager: Justice Rocher, RN  Phone: [phone]

## 2017-04-30 NOTE — Consults (Signed)
[facility]  INITIAL CONSULT NOTE    Patient: Kenneth Weeks, Kenneth Weeks, 67 y.o. male  MRN: [id number]  Date of Admission:  04/21/2017  Date of Service:  04/30/2017  Date of Birth:  November 27, 1950  PCP: Viona Gilmore, MD.  Information obtained from: Patient, health care provider, history reviewed via medical record  Consult requested by: Gen Surg Gold    Consult for: Scrotal swelling and ecchymosis      HPI:     Kenneth Weeks is a 67 yo M with PMH of hydrocele s/p aspiration 3 years ago, atrial fibrillation, CHF, DM type II, mechanical aortic valve on warfarin, and Marfan's syndrome who presented with ventral hernia s/pelective ventral hernia repair with mesh on3/13/19and evacuation of hematoma explantation of prolene meshon3/15/2019.  Consult to Urology for two days of scrotal swelling with ecchymosis.   The patient is wife reports that patient has had scrotal swelling for the past day and a half or 2 days.  He reports the last time he had scrotal swelling was 3 years ago. He was seen by a urologist in his hometown at that time and was diagnosed with a hydrocele and underwent aspiration of the fluid.  The patient reports he feels that he is more swollen in his lower body in general.   Patient's home medication lasix has been held for the past two days. Last dose on 3/20.   Patient denies difficulty emptying his bladder, however he states that it is inconvenient when his scrotum dips into the toilet bowl.        ROS:     ROS Other than ROS in the HPI, all other systems were negative.    PAST MEDICAL/ FAMILY/ SOCIAL HISTORY:    PAST MEDICAL:    Past Medical History:   Diagnosis Date   . A-fib (CMS HCC)     Chronic anti-coagulant therapy; Hx cardioversions   . Abdominal hernia     Ventral hernia; Left Inguinal hernia   . Acute on chronic renal failure (CMS HCC)    . Biliary and gallbladder disorder     "Gallstones"   . Blood thinned due to long-term anticoagulant use    . Bruises  easily    . Cancer (CMS HCC) 04/09/2017    Colon grade 0 surgery   . Congestive heart failure (CMS HCC) 04/09/2017    CHF 01/2017    . Diabetes mellitus (CMS [place])     type ii diet controlled   . Disorder of liver 04/09/2017    fatty liver   . Dysrhythmias     a fib   . Ejection fraction < 50%    . Heart murmur    . Hemorrhage of gastrointestinal tract 5/16   . Hemorrhagic shock (CMS HCC)    . Hepatic cirrhosis (CMS HCC) 27/51/7001    non alcoholic   . Hx of transfusion 04/09/2017    no reaction   . Hydrocele    . Hypertension 07/10/2014   . Hypothyroid    . Marfan syndrome    . Neck problem 04/09/2017    LROM   . Non-healing non-surgical wound    . Obesity    . Palpitations    . Peripheral vascular disease (CMS [place]) 04/09/2017    no recent problems since starting lasix in 01/2017   . Syncope    . Thyroid disease     hypothyroid   . Transfusion history     s/p Multiple  units PRC/FFP this adm.   . Type 2 diabetes mellitus (CMS [place]) 04/09/2017    diet controlled, HA1C 6.0   . Valvular disease 04/09/2017    [facility] aortic valve replacement-chronic anti-coagulant therapy   . Wears glasses         Past Surgical History:   Procedure Laterality Date   . COLONOSCOPY     . HX COLONOSCOPY     . HX HEART CATHETERIZATION     . [place]    [facility]    . HX HEMICOLECTOMY Right 07/13/2014    Laparoscopic right hemicolectomy for colon cancer   . HX TONSIL AND ADENOIDECTOMY     . HX TONSILLECTOMY     . HX UPPER ENDOSCOPY  07/09/14   . HX WISDOM TEETH EXTRACTION              Medications Prior to Admission     Prescriptions    cholecalciferol, vitamin D3, 1,000 unit Oral Tablet    Take 1,000 Units by mouth Once a day    docusate sodium (COLACE) 100 mg Oral Capsule    Take 1 Cap (100 mg total) by mouth Twice daily    enoxaparin (LOVENOX) 120 mg/0.8 mL Subcutaneous Syringe    0.8 mL (120 mg total) by Subcutaneous route Every 12 hours    eplerenone (INSPRA) 25 mg Oral Tablet    Take 1 Tab (25 mg total) by mouth Once a  day    furosemide (LASIX) 40 mg Oral Tablet    Take 1 Tab (40 mg total) by mouth Once a day    levothyroxine (SYNTHROID) 200 mcg Oral Tablet    TAKE ONE TABLET BY MOUTH ONCE DAILY    lisinopril (PRINIVIL) 5 mg Oral Tablet    Take 1 Tab (5 mg total) by mouth Once a day    MAGNESIUM CARBONATE ORAL    Take by mouth Once a day    metoprolol succinate (TOPROL-XL) 100 mg Oral Tablet Sustained Release 24 hr    Take 100 mg by mouth Once a day    vitamin B complex Oral Tablet    Take 1 Tab by mouth Once a day    warfarin (COUMADIN) 5 mg Oral Tablet    Take 1.5 Tabs (7.5 mg total) by mouth Once a day    Patient taking differently:  Take 7.5 mg by mouth Once a day To be adjusted by following provider        Allergies   Allergen Reactions   . Allegra [Fexofenadine]  Other Adverse Reaction (Add comment)     "retain fluid"   . Antihistamines - Alkylamine      Pt states antihistamines cause him to retain fluid        Family History  Family Medical History:     Problem Relation (Age of Onset)    Cirrhosis Brother    Hypertension Mother    Lung Cancer Father          Social History  Social History     Socioeconomic History   . Marital status: Married     Spouse name: Not on file   . Number of children: Not on file   . Years of education: Not on file   . Highest education level: Not on file   Occupational History   . Not on file   Social Needs   . Financial resource strain: Not on file   . Food insecurity:  Worry: Not on file     Inability: Not on file   . Transportation needs:     Medical: Not on file     Non-medical: Not on file   Tobacco Use   . Smoking status: Never Smoker   . Smokeless tobacco: Never Used   Substance and Sexual Activity   . Alcohol use: No     Alcohol/week: 0.0 oz   . Drug use: No   . Sexual activity: Not on file   Lifestyle   . Physical activity:     Days per week: Not on file     Minutes per session: Not on file   . Stress: Not on file   Relationships   . Social connections:     Talks on phone: Not on file       Gets together: Not on file     Attends religious service: Not on file     Active member of club or organization: Not on file     Attends meetings of clubs or organizations: Not on file     Relationship status: Not on file   . Intimate partner violence:     Fear of current or ex partner: Not on file     Emotionally abused: Not on file     Physically abused: Not on file     Forced sexual activity: Not on file   Other Topics Concern   . Ability to Walk 1 Flight of Steps without SOB/CP Yes   . Routine Exercise Not Asked   . Ability to Walk 2 Flight of Steps without SOB/CP Yes   . Unable to Ambulate Not Asked   . Total Care Not Asked   . Ability To Do Own ADL's Yes   . Uses Walker Not Asked   . Other Activity Level Yes     Comment: walks a lot a work, up and down steps several times a day   . Uses Cane Not Asked   Social History Narrative   . Not on file        PHYSICAL EXAMINATION:    Temperature: 36.5 C (97.7 F)  Heart Rate: (!) 106  BP (Non-Invasive): 106/64  Respiratory Rate: 18  SpO2: 93 %  Pain Score (Numeric, Faces): 0  Constitutional (e.g. vital signs, general appearance) - 67 y.o. male, chronically ill appearing  Eyes: Conjunctiva clear, sclera white   Ears, nose, mouth, and throat -  Trachea midline. Patient has two ears. Nose is midline, nostrils appear patent. Moist mucous membranes.    Cardiovascular - Pulse palpable.  Respiratory - Unlabored respiratory effort  Abdomen - Midline wound vac with extensive surrounding superficial ecchymosis   Musculoskeletal - All four extremities present  Skin - Warm and dry  Neurological - CN II-XII grossly intact, A&O x 3  Psychiatric - Normal affect  Hematologic/lymphatic/immunologic - No petechiae.  Extremities: BLE with 2+ pitting edema   Genitourinary - enlarged, swollen scrotum with superficial ecchymosis involving dependent scrotal skin, nontender to palpation, no areas of skin breakdown, induration or fluctuance.  Acquired buried penis      Labs Ordered/  Reviewed   Reviewed: Labs:  Lab Results for Last 24 Hours:   Results for orders placed or performed during the hospital encounter of 04/21/17 (from the past 24 hour(s))   BASIC METABOLIC PANEL   Result Value Ref Range    SODIUM 128 (L) 136 - 145 mmol/L    POTASSIUM 4.4 3.5 - 5.1 mmol/L  CHLORIDE 96 96 - 111 mmol/L    CO2 TOTAL 24 22 - 32 mmol/L    ANION GAP 8 4 - 13 mmol/L    CALCIUM 8.8 8.5 - 10.2 mg/dL    GLUCOSE 147 (H) 65 - 139 mg/dL    BUN 14 8 - 25 mg/dL    CREATININE 0.93 0.62 - 1.27 mg/dL    BUN/CREA RATIO 15 6 - 22    ESTIMATED GFR >59 >59 mL/min/1.20m^2   MAGNESIUM   Result Value Ref Range    MAGNESIUM 1.9 1.6 - 2.5 mg/dL   PHOSPHORUS   Result Value Ref Range    PHOSPHORUS 3.4 2.3 - 4.0 mg/dL   PT/INR   Result Value Ref Range    PROTHROMBIN TIME 13.4 9.5 - 14.1 seconds    INR 1.14 0.80 - 1.20   CBC WITH DIFF   Result Value Ref Range    WBC 10.5 3.5 - 11.0 x10^3/uL    RBC 2.72 (L) 4.06 - 5.63 x10^6/uL    HGB 8.3 (L) 12.5 - 16.3 g/dL    HCT 25.0 (L) 36.7 - 47.0 %    MCV 91.9 78.0 - 100.0 fL    MCH 30.7 27.4 - 33.0 pg    MCHC 33.4 32.5 - 35.8 g/dL    RDW 17.9 (H) 12.0 - 15.0 %    PLATELETS 238 140 - 450 x10^3/uL    MPV 7.9 7.5 - 11.5 fL    NEUTROPHIL % 83 %    LYMPHOCYTE % 7 %    MONOCYTE % 8 %    EOSINOPHIL % 2 %    BASOPHIL % 1 %    NEUTROPHIL # 8.66 (H) 1.50 - 7.70 x10^3/uL    LYMPHOCYTE # 0.71 (L) 1.00 - 4.80 x10^3/uL    MONOCYTE # 0.84 0.30 - 1.00 x10^3/uL    EOSINOPHIL # 0.20 0.00 - 0.50 x10^3/uL    BASOPHIL # 0.06 0.00 - 0.20 x10^3/uL        Radiology Tests Ordered/ Reviewed    Results for orders placed or performed during the hospital encounter of 04/21/17   [place] PAIN BLOCK     Status: None    Narrative    *Procedure not read by radiology.  *Refer to procedure note for result.   XR ABD SUPINE     Status: None    Narrative    Simran NOEL Mcquaig  Male, 67 years old.    XR ABD SUPINE performed on 04/22/2017 11:43 PM.    REASON FOR EXAM:  increased abdominal distention    TECHNIQUE: 1 view(s) 3 image(s)  submitted for interpretation.    COMPARISON: CT abdomen and pelvis with IV contrast 04/23/2017    FINDINGS: Minimal bibasilar atelectasis. Sternal wires are noted.    Air is seen within prominent loops of large bowel. Air is noted to the  level of the rectum. A nonobstructed bowel gas pattern is noted. Moderate  fecal retention is seen. A hazy opacity is noted projecting over the  sacrum, when compared to CT this likely represents the abdominal wall  hematoma. Midline surgical staples are noted. Osseous structures are  intact.    SUPPORT DEVICES: A drainage tube is noted projecting over the inferior  aspect of the midline incision staple line.      Impression    1. Nonobstructive bowel gas pattern with moderate fecal retention.  2. Hazy opacity projecting over the sacrum, likely corresponding to the  abdominal wall hematoma.  CT ABDOMEN PELVIS W IV CONTRAST     Status: None    Narrative    Rayman NOEL Hiller  Male, 67 years old.    CT ABDOMEN PELVIS W IV CONTRAST performed on 04/23/2017 3:21 AM.    REASON FOR EXAM:  Acutely worsening abdominal pain  RADIATION DOSE: 1556.30 mGycm  CONTRAST: 100 ml's of Optiray 320    TECHNIQUE: CT of the abdomen and pelvis performed with oral and intravenous  contrast.    COMPARISON: March 22, 2017.    FINDINGS:  Lung bases: There is some subsegmental atelectasis in the lung bases.  Liver: No focal hepatic masses are seen. There is a slightly lobulated  contour of the liver which is unchanged and which may indicate fibrosis.  Gallbladder and biliary tree: Multiple gallstones are noted. There is no  evidence of cholecystitis.  Pancreas: There is moderate pancreatic atrophy.  Spleen: Normal.  Adrenals: Normal.  Kidneys and ureters: There is unchanged mild renal cortical thinning. There  is mild symmetric bilateral perinephric stranding. No hydronephrosis is  seen.  Bladder: No bladder wall thickening is seen.  Reproductive Organs: The prostate gland is enlarged measuring 4.9 cm  in  transverse dimension.  Bowel: There is no bowel wall thickening. There is redundant sigmoid colon.  There has again been resection of the right colon and ileocolic anastomosis  in the right mid abdomen.    Lymph Nodes: Early prominent but nonpathologically enlarged para-aortic  retroperitoneal lymph nodes are unchanged. There are some mildly prominent  bilateral inguinal lymph nodes which appear unchanged. Surgical clips are  noted in the left inguinal region.    Peritoneum: There is some free intraperitoneal air in the upper abdomen  related to recent ventral hernia repair and lysis of adhesions performed on  April 21, 2017.  Vessels: The abdominal aorta is normal in caliber.  Retroperitoneum: No retroperitoneal hematoma is seen.  Abdominal Wall: The patient has undergone ventral hernia repair with  placement of a mesh. The hernias are no longer seen. There is however a  large amount of subcutaneous hematoma in the anterior abdominal wall with  layering hematocrit levels and areas of hyperdensity concerning for active  extravasation. One of these areas is seen on series 2 image 55 at the  superior end of the hematoma and the other is seen on series 2 image 65 at  the left margin. The abdominal wall hematoma is multiloculated and contains  air. In totality the hematoma measures up to 10.2 x 31.2 x 14.5 cm in AP by  transverse by craniocaudad dimensions.  Bones: Advanced disc space height loss and disc bulging with posterior  endplate osteophyte formation are noted at L4-L5 and L5-S1.      Impression    1. Very large subcutaneous hematoma in the anterior abdominal wall with  areas of active contrast extravasation. The patient was post op for the  hematoma at the time of this interpretation.  2. Free intraperitoneal air likely related to recent surgery.  3. Interval ventral hernia repair with no residual hernia identified.  4. Unchanged mildly prominent but nonpathologically enlarged  retroperitoneal and inguinal  lymph nodes.  5. Cholelithiasis.        Radiologist location ID: [id number]          Impression:   67 y.o. male with:    1. Swollen, ecchymotic scrotum  2. Ventral hernia s/p elective VHR with mesh 04/23/17 and takeback and explant of mesh for hematoma 04/27/17  3. H/o  hydrocele s/p aspiration 2016  3. Mutliple co-morbitidies: atrial fibrillation, CHF, DM type II, mechanical aortic valve on warfarin, and Marfans syndrome     Recommendations:    Patient's scrotal swelling appears to be related to overall volume overload status  The ecchymosis appears to be related to tracking of the midline hematoma inferiorly along the superficial fascial planes.  Patient is afebrile and there is no leukocytosis.  No concern for infection or Fournier's gangrene.  He is voiding without difficulty.  No acute urologic intervention.  Recommend elevation of scrotum and application of ice to scrotum 20 min on and off.  Recommend restarting home Lasix if medically able.    The scrotal swelling will likely be self-limiting and resolved on its own.  Follow-up only as needed.     Please call/page [facility] with any questions, concerns, or changes in the interim     Rosemary Holms, MD   [facility]  Department of Urology   04/30/2017, 11:45      I reviewed e-chart and available imaging.    I reviewed case with Dr. Lenard Lance.    I agree with her A/P without exception.

## 2017-04-30 NOTE — Nurses Notes (Signed)
Text paged service. Patient's heart rate 170's with ambulation. Resolved with rest. Current heart rate 115. Will continue to monitor.  Rudie Meyer, RN  04/30/2017, 10:43

## 2017-05-03 NOTE — Care Management Notes (Signed)
Referral Information  ++++++ Placed Provider #1 ++++++  Case Manager: Justice Rocher  Provider Type: Home Health  Provider Name: [facility]  Address:  [address]  [place], [place] [address]  Contact: Marshell Garfinkel    Phone: [phone] x  Fax:   Fax: [phone]

## 2017-05-03 NOTE — Care Management Notes (Signed)
Referral Information  ++++++ Placed Provider #1 ++++++  Case Manager: Beth Guthrie  Provider Type: [facility] Referral  Provider Name: [facility]  Address:  [address] [place] [address]  Contact: [facility]    Phone: [phone] x  Fax:   Fax: [phone]

## 2017-05-04 ENCOUNTER — Telehealth (HOSPITAL_COMMUNITY): Payer: Self-pay

## 2017-05-04 NOTE — Anesthesia Postprocedure Evaluation (Signed)
Anesthesia Post Op Evaluation    Patient: Kenneth Weeks  Procedure(s) with comments:  IRRIGATION AND DEBRIDEMENT ABDOMEN  EVACUATION HEMATOMA ABDOMINAL  CHANGE/REMOVAL WOUND VAC - Removal     Last Vitals:Temperature: 36.5 C (97.7 F) (04/30/17 0828)  Heart Rate: (!) 106 (04/30/17 0828)  BP (Non-Invasive): 106/64 (04/30/17 0828)  Respiratory Rate: 18 (04/30/17 0828)  SpO2: 93 % (04/30/17 0819)  Pain Score (Numeric, Faces): 5 (04/30/17 1300)  Patient is sufficiently recovered from the effects of anesthesia to participate in the evaluation and has returned to their pre-procedure level.  Patient location during evaluation: PACU   Post-procedure handoff checklist completed    Patient participation: complete - patient participated  Level of consciousness: awake and alert and responsive to verbal stimuli  Pain management: adequate  Airway patency: patent  Anesthetic complications: no  Cardiovascular status: acceptable  Respiratory status: acceptable  Hydration status: acceptable  Patient post-procedure temperature: Pt Normothermic   PONV Status: Absent

## 2017-05-04 NOTE — Telephone Encounter (Signed)
Received a call from [place] at [facility]; he has requested signed [facility] order and discharged summary. Requested information has been faxed to [phone].

## 2017-05-05 ENCOUNTER — Telehealth (INDEPENDENT_AMBULATORY_CARE_PROVIDER_SITE_OTHER): Payer: Self-pay | Admitting: INTERNAL MEDICINE

## 2017-05-05 ENCOUNTER — Other Ambulatory Visit (HOSPITAL_BASED_OUTPATIENT_CLINIC_OR_DEPARTMENT_OTHER): Payer: Medicare Other | Attending: INTERNAL MEDICINE

## 2017-05-05 DIAGNOSIS — I4891 Unspecified atrial fibrillation: Secondary | ICD-10-CM | POA: Insufficient documentation

## 2017-05-05 LAB — PT/INR
INR: 1.38
PROTHROMBIN TIME: 16.1 seconds — ABNORMAL HIGH (ref 9.4–12.5)

## 2017-05-05 NOTE — Telephone Encounter (Signed)
Mackey, called and said she dropped off pts INR to [facility] and she wanted to let us know he is now on  Lovenox 120mg  Q 12 hours, finished last dose this morning.  He was taking 7.5 mg of warfarin since Friday, 04/30/2017.  She was  wanted to make sure you were aware that it might be high due to the Lovenox.  Nurse wont see him on Friday to recheck his  INR because Pt will not be home, he has an appt in [place].    [name], CMA  05/05/2017, 15:46

## 2017-05-06 ENCOUNTER — Encounter (INDEPENDENT_AMBULATORY_CARE_PROVIDER_SITE_OTHER): Payer: Self-pay | Admitting: INTERNAL MEDICINE

## 2017-05-06 NOTE — Telephone Encounter (Signed)
pts nurse, Margarita Grizzle called and pts INR is 1.38. PT was 16.1.  Pt has 5mg  tablets. Please advise.  [name], [place]  05/06/2017, 16:28

## 2017-05-06 NOTE — Addendum Note (Signed)
Addendum  created 05/06/17 2047 by Guadalupe Maple, MD    Intraprocedure Attestations filed

## 2017-05-06 NOTE — Progress Notes (Signed)
Encounter opened to scan records

## 2017-05-07 ENCOUNTER — Encounter (INDEPENDENT_AMBULATORY_CARE_PROVIDER_SITE_OTHER): Payer: Self-pay | Admitting: Surgery

## 2017-05-07 ENCOUNTER — Ambulatory Visit: Payer: Medicare Other | Attending: Surgery | Admitting: Surgery

## 2017-05-07 ENCOUNTER — Ambulatory Visit (INDEPENDENT_AMBULATORY_CARE_PROVIDER_SITE_OTHER): Payer: Medicare Other | Admitting: Rheumatology

## 2017-05-07 VITALS — BP 126/78 | HR 109 | Temp 98.4°F | Ht 75.0 in | Wt 284.6 lb

## 2017-05-07 DIAGNOSIS — K432 Incisional hernia without obstruction or gangrene: Secondary | ICD-10-CM

## 2017-05-07 DIAGNOSIS — Z48815 Encounter for surgical aftercare following surgery on the digestive system: Secondary | ICD-10-CM | POA: Insufficient documentation

## 2017-05-07 DIAGNOSIS — L7632 Postprocedural hematoma of skin and subcutaneous tissue following other procedure: Secondary | ICD-10-CM | POA: Insufficient documentation

## 2017-05-07 DIAGNOSIS — Z952 Presence of prosthetic heart valve: Secondary | ICD-10-CM

## 2017-05-07 DIAGNOSIS — I482 Chronic atrial fibrillation, unspecified: Secondary | ICD-10-CM

## 2017-05-07 DIAGNOSIS — Z9889 Other specified postprocedural states: Secondary | ICD-10-CM

## 2017-05-07 DIAGNOSIS — Z8719 Personal history of other diseases of the digestive system: Secondary | ICD-10-CM | POA: Insufficient documentation

## 2017-05-07 LAB — PT/INR
INR: 1.38 — ABNORMAL HIGH (ref 0.80–1.20)
PROTHROMBIN TIME: 16.1 seconds — ABNORMAL HIGH (ref 9.5–14.1)

## 2017-05-07 NOTE — Telephone Encounter (Signed)
[facility] and gave them this message. Also relayed message to pt.  He has no questions or concerns.  [name], [place]  05/07/2017, 13:35

## 2017-05-07 NOTE — Telephone Encounter (Signed)
Okay. Thanks.     Go ahead and go to 10 mg daly for Friday, Saturday and Sunday.   Then drop down to 5 mg daily     Check INR next Wednesday     Thanks

## 2017-05-07 NOTE — Progress Notes (Signed)
Pt is taking 7.5mg  every day except for Monday and Friday he takes 10mg .  Please advise.  [name], [place]  05/07/2017, 11:42

## 2017-05-07 NOTE — Progress Notes (Signed)
Pt notified of results and verbalized understanding. Pt had no questions or concerns.  [name], CMA  05/07/2017, 16:40

## 2017-05-07 NOTE — Progress Notes (Signed)
Kenneth Weeks returns to clinic today after recent open ventral incisional hernia repair.  This was complicated by post-operative hematoma which was very large and required return to the OR for evacuation of clot and removal of the mesh.  Consequently, he has an open wound subcutaneously with a primary hernia repair with suture only.  He reports pain to the abdominal wall with wound vac changes, but otherwise minimal abdominal pain.  He reports some increased shortness of breath and edema to the legs and scrotum.  He has been urinating frequently at night.  He is tolerating a regular diet without nausea or vomiting.  He has been following a higher protein diet to help with wound healing.    BP 126/78   Pulse (!) 109   Temp 36.9 C (98.4 F) (Thermal Scan)   Ht 1.905 m (6\' 3" )   Wt 129.1 kg (284 lb 9.8 oz)   SpO2 98% Comment: room air  BMI 35.57 kg/m     Wound vac was removed and replaced.    5cm wide x 7cm tall and 5cm deep.  I debrided about a square cm of fibrin from the base of the wound.  No signs of infection.  There is granulation tissue on the sides but not the base.  I cannot see the stitches any longer from the hernia repair.  Extensive bruising caudad to the wound on both sides.  Firmness appreciated as appropriate for such a large hematoma.  +tender on palpation.  His wife reports that the area of firmness is decreasing.  + pitting edema to bilateral lower extremities  Scrotum is very edematous as well    A/P-  67yo male with open abdominal wall wound after ventral incisional hernia repair complicated by hematoma.    -  Continue local wound care  -  RTC in 3 weeks  -  Advise he see his PCP re increased edema  -  Will check INR today

## 2017-05-10 ENCOUNTER — Encounter (INDEPENDENT_AMBULATORY_CARE_PROVIDER_SITE_OTHER): Payer: Self-pay | Admitting: INTERNAL MEDICINE

## 2017-05-10 ENCOUNTER — Ambulatory Visit (INDEPENDENT_AMBULATORY_CARE_PROVIDER_SITE_OTHER): Payer: Medicare Other | Admitting: INTERNAL MEDICINE

## 2017-05-10 VITALS — BP 110/70 | HR 96 | Temp 97.8°F | Ht 75.0 in | Wt 284.6 lb

## 2017-05-10 DIAGNOSIS — Z6835 Body mass index (BMI) 35.0-35.9, adult: Secondary | ICD-10-CM

## 2017-05-10 DIAGNOSIS — E669 Obesity, unspecified: Secondary | ICD-10-CM

## 2017-05-10 DIAGNOSIS — Z85038 Personal history of other malignant neoplasm of large intestine: Secondary | ICD-10-CM

## 2017-05-10 DIAGNOSIS — R609 Edema, unspecified: Principal | ICD-10-CM

## 2017-05-10 DIAGNOSIS — I5030 Unspecified diastolic (congestive) heart failure: Secondary | ICD-10-CM

## 2017-05-10 DIAGNOSIS — E119 Type 2 diabetes mellitus without complications: Secondary | ICD-10-CM

## 2017-05-10 DIAGNOSIS — E039 Hypothyroidism, unspecified: Secondary | ICD-10-CM

## 2017-05-10 DIAGNOSIS — I4891 Unspecified atrial fibrillation: Secondary | ICD-10-CM

## 2017-05-10 MED ORDER — FUROSEMIDE 40 MG TABLET
ORAL_TABLET | ORAL | 5 refills | Status: DC
Start: 2017-05-10 — End: 2017-09-06

## 2017-05-10 NOTE — Progress Notes (Signed)
[facility]   [address]   [place], [place] [address]     [phone]  [phone] (fax)      INTERNAL MEDICINE, [facility]  [address]  [place] [address]    Acute Visit     Name: Kenneth Weeks MRN:  [id number]   Date: 05/10/2017 Age: 67 y.o.       SUBJECTIVE:   Kenneth Weeks is a 67 y.o. male for swelling.     Kenneth Weeks has been swelling since "I had surgery." He had a ventral hernia repair in [place] and he was [place] from [place] on March 22nd.     He went home with a wound vacuum.     No other new medications     He is sitting around more with the open wound vacuum.     He is not short of breath    Home weights:   280, 279, 278, 280lbs over the past few days       Medications reviewed by me personally:   Warfarin 7.5mg  daily in the AM   Synthroid 282mcg daily in the AM   Metoprolol XL 100 mg daily   Inspra 25 mg daily   Lasix 40 mg daily     Spironolactone 50 mg daily -- no taking   Potassium daily -- OTC supplement   Magnesium tablet   Vitamin D3   Calcium OTC         I have reviewed and updated as appropriate the past medical, family and social history. 05/10/2017 as summarized below:  Past Medical History:   Diagnosis Date   . A-fib (CMS HCC)     Chronic anti-coagulant therapy; Hx cardioversions   . Abdominal hernia     Ventral hernia; Left Inguinal hernia   . Acute on chronic renal failure (CMS HCC)    . Biliary and gallbladder disorder     "Gallstones"   . Blood thinned due to long-term anticoagulant use    . Bruises easily    . Cancer (CMS HCC) 04/09/2017    Colon grade 0 surgery   . Congestive heart failure (CMS HCC) 04/09/2017    CHF 01/2017    . Diabetes mellitus (CMS [place])     type ii diet controlled   . Disorder of liver 04/09/2017    fatty liver   . Dysrhythmias     a fib   . Ejection fraction < 50%    . Heart murmur    . Hemorrhage of gastrointestinal tract 5/16   . Hemorrhagic shock (CMS HCC)    . Hepatic cirrhosis (CMS HCC) 02/17/3233     non alcoholic   . Hx of transfusion 04/09/2017    no reaction   . Hydrocele    . Hypertension 07/10/2014   . Hypothyroid    . Marfan syndrome    . Neck problem 04/09/2017    LROM   . Non-healing non-surgical wound    . Obesity    . Palpitations    . Peripheral vascular disease (CMS [place]) 04/09/2017    no recent problems since starting lasix in 01/2017   . Syncope    . Thyroid disease     hypothyroid   . Transfusion history     s/p Multiple units PRC/FFP this adm.   . Type 2 diabetes mellitus (CMS [place]) 04/09/2017    diet controlled, HA1C 6.0   . Valvular disease 04/09/2017    [facility] aortic valve replacement-chronic anti-coagulant  therapy   . Wears glasses      Past Surgical History:   Procedure Laterality Date   . Colonoscopy     . Hx colonoscopy     . Hx heart catheterization     . Hx heart valve surgery  1999   . Hx hemicolectomy Right 07/13/2014   . Hx tonsil and adenoidectomy     . Hx tonsillectomy     . Hx upper endoscopy  07/09/14   . Hx wisdom teeth extraction       Current Outpatient Medications   Medication Sig   . acetaminophen (TYLENOL) 325 mg Oral Tablet Take 2 Tabs (650 mg total) by mouth Every 6 hours as needed   . cholecalciferol, vitamin D3, 1,000 unit Oral Tablet Take 1,000 Units by mouth Once a day   . docusate sodium (COLACE) 100 mg Oral Capsule Take 1 Cap (100 mg total) by mouth Twice daily   . enoxaparin (LOVENOX) 120 mg/0.8 mL Subcutaneous Syringe 0.8 mL (120 mg total) by Subcutaneous route Every 12 hours   . eplerenone (INSPRA) 25 mg Oral Tablet Take 1 Tab (25 mg total) by mouth Once a day   . levothyroxine (SYNTHROID) 200 mcg Oral Tablet TAKE ONE TABLET BY MOUTH ONCE DAILY   . lisinopril (PRINIVIL) 5 mg Oral Tablet Take 1 Tab (5 mg total) by mouth Once a day   . MAGNESIUM CARBONATE ORAL Take by mouth Once a day   . methocarbamol (ROBAXIN) 500 mg Oral Tablet Take 1 Tab (500 mg total) by mouth Four times a day as needed   . metoprolol succinate (TOPROL-XL) 100 mg Oral Tablet Sustained Release 24  hr Take 100 mg by mouth Once a day   . oxyCODONE (ROXICODONE) 5 mg Oral Tablet Take 1 Tab (5 mg total) by mouth Every 4 hours as needed   . vitamin B complex Oral Tablet Take 1 Tab by mouth Once a day   . warfarin (COUMADIN) 5 mg Oral Tablet Take 1.5 Tabs (7.5 mg total) by mouth Once a day (Patient taking differently: Take 7.5 mg by mouth Once a day To be adjusted by following provider)     Family Medical History:     Problem Relation (Age of Onset)    Cirrhosis Brother    Hypertension Mother    Lung Cancer Father            Social History     Socioeconomic History   . Marital status: Married     Spouse name: Not on file   . Number of children: Not on file   . Years of education: Not on file   . Highest education level: Not on file   Occupational History   . Not on file   Social Needs   . Financial resource strain: Not on file   . Food insecurity:     Worry: Not on file     Inability: Not on file   . Transportation needs:     Medical: Not on file     Non-medical: Not on file   Tobacco Use   . Smoking status: Never Smoker   . Smokeless tobacco: Never Used   Substance and Sexual Activity   . Alcohol use: No     Alcohol/week: 0.0 oz   . Drug use: No   . Sexual activity: Not on file   Lifestyle   . Physical activity:     Days per week: Not on file  Minutes per session: Not on file   . Stress: Not on file   Relationships   . Social connections:     Talks on phone: Not on file     Gets together: Not on file     Attends religious service: Not on file     Active member of club or organization: Not on file     Attends meetings of clubs or organizations: Not on file     Relationship status: Not on file   . Intimate partner violence:     Fear of current or ex partner: Not on file     Emotionally abused: Not on file     Physically abused: Not on file     Forced sexual activity: Not on file   Other Topics Concern   . Ability to Walk 1 Flight of Steps without SOB/CP Yes   . Routine Exercise Not Asked   . Ability to Walk 2  Flight of Steps without SOB/CP Yes   . Unable to Ambulate Not Asked   . Total Care Not Asked   . Ability To Do Own ADL's Yes   . Uses Walker Not Asked   . Other Activity Level Yes     Comment: walks a lot a work, up and down steps several times a day   . Uses Cane Not Asked   Social History Narrative   . Not on file     Review of Systems: A comprehensive review of systems was negative.   Exceptions noted above      List of Current Health Care Providers   Care Team     PCP     Name Type Specialty Phone Number    Viona Gilmore, MD Physician [facility] [phone]          Care Team     Name Type Specialty Phone Number    Viona Gilmore, MD Physician INTERNAL MEDICINE [phone]                  Health Maintenance   Topic Date Due   . Adult Tdap-Td (1 - Tdap) 05/13/1967   . Shingles Vaccine (1 of 2) 05/13/1998   . Pneumococcal 65+ Years Low Risk (1 of 2 - PCV13) 05/12/2013   . Influenza Vaccine (Season Ended) 10/10/2017   . Depression Screening  03/08/2018   . Annual Wellness Exam  03/08/2018   . Colonoscopy  01/27/2026   . Hepatitis C screening antibody  Completed     Medicare Wellness Assessment      Advance Directives (optional)                  Activities of Daily Living                   Urinary Incontinence Screen (Women >=65 only)       Cognitive Function Screen (1=Yes, 0=No)                                               Hearing Screen       Fall Risk Screen       Vision Screen           Depression Screen            OBJECTIVE:   BP 110/70   Pulse 96  Temp 36.6 C (97.8 F) (Thermal Scan)   Ht 1.905 m (6\' 3" )   Wt 129.1 kg (284 lb 9.6 oz)   SpO2 98%   BMI 35.57 kg/m      Physical Exam     General -- alert     Repeat BP taken by me -- 138/80     HEENT -- PERRL, EOMI   Heart --mechanical click  Neck -- Thyroid is normal sized  Lungs -- clear; no crackles in bases  Abdomen -- soft and tender; there is a wound vacuum in place   Neuro -- normal gait and tone; strength is  5/5  Skin -- bruising on the lower abdomen on both sides   Psych -- pleasant and smiling   Vascular -- there is edema up to the knee on both sides       Labs -- reviewed by me in the EMR \  March 2019    WBC 10.5   Hgb 8.3   Platelets 238    Creatinine 0.93    Glucose 147     January 2019    Dr. Tish Men -- Cardiomyopathy/diastolic CHF     November 2018    Creatinine 1.22   LFT's okay     May 2018                 WBC 5.9                Hgb 15.7                Platelets 127                 Creatinine 1.02                A1c 5.7%                 CEA 1.17 January 2016                 Colonic biopsy -- hyperplastic polyp only     September 2017                 Creatinine 1.21                 Hgb 15.18 August 2015                 Hgb 15.7                Creatinine 1.14                CT scan of chest -- minimal lung nodules, not changed; not big     March 2017                 INR 2.17 January 2015                 Dr. Burke Keels note: no evidence of any metastatic disease                 Dr. Dianah Field note: pT2N0 stage I colon cancer                 Hgb 11.8                 Creatinine 1.04                 Glucose 99  LFT's okay                CEA 1.5                CT chest/abdomen/pelvis -- no evidence of recurrent disease; some small lung nodules     June 2016                [place] Note:                               Follow up colonoscopy in December 2016                               CT scan of chest/abdomen/pelvis in summer 2017     May 2016                Creatinine 1.27                Glucose 136 (not fasting)                 LFT's okay     March 2016                 Creatinine 1.02                TSH 1.23                LFT's okay     Health Maintenance Due   Topic Date Due   . Adult Tdap-Td (1 - Tdap) 05/13/1967   . Shingles Vaccine (1 of 2) 05/13/1998   . Pneumococcal 65+ Years Low Risk (1 of 2 - PCV13) 05/12/2013      ASSESSMENT & PLAN:    Identified Risk Factors/ Recommended  Actions   Peripheral edema after surgery: secondary to stasis -- no hint of ARF, CHF or liver failure -- last GFR was okay. I am going to increase the lasix to 40 mg BID now; continue to elevated scrotum     Low INR -- checking INR on Wednesday with home nurse     Diabetes -- doing fantastic     Ventral hernia repair  -- has a wound vacuum in place     S/P laporoscopic right hemicolectomy with stapled anastomosis for stage II colon cancer -- had a clean colonoscopy in December 2016 and December 2017. He will likely not need another scope until 1950     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 - he is back on Toprol    Hypothyroidism -- on replacement    Obesity -- he is generally doing well overall     Vision -- glasses; no changes     Hearing issues -- not limited     Exercise --  He is walking on a regular basis    Need for HIV or hepatitis C screening -- no reason to screen     Tobacco abuse screening -- none at all     Alcohol and drug abuse screening -- none at all     Depression/anxiety screening -- he denies any at this time     Screening for colorectal cancer -- he is not due for screening     Immunizations -- I recommend a yearly flu shot       DISPOSITION:  Elevate lasix to 40 mg BID: 8AM and then 2PM --> [employer]  Continue to check weight daily in the AM     Follow up with me in 10 days     Check INR at home on Wednesday     No orders of the defined types were placed in this encounter.         The patient has been educated about risk factors and recommended preventive care. Written Prevention Plan completed/ updated and given to patient (see After Visit Summary).    No follow-ups on file.    Viona Gilmore, MD  [facility] INTERNAL MEDICINE  INTERNAL MEDICINE, [facility]  [address]  [place] [address]  [phone]

## 2017-05-10 NOTE — Nursing Note (Signed)
BP 110/70   Pulse 96   Temp 36.6 C (97.8 F) (Thermal Scan)   Ht 1.905 m (6\' 3" )   Wt 129.1 kg (284 lb 9.6 oz)   SpO2 98%   BMI 35.57 kg/m     Lenice Pressman, CMA  05/10/2017, 13:47

## 2017-05-12 ENCOUNTER — Other Ambulatory Visit (HOSPITAL_BASED_OUTPATIENT_CLINIC_OR_DEPARTMENT_OTHER): Payer: Medicare Other | Attending: INTERNAL MEDICINE

## 2017-05-12 ENCOUNTER — Ambulatory Visit (INDEPENDENT_AMBULATORY_CARE_PROVIDER_SITE_OTHER): Payer: Self-pay | Admitting: INTERNAL MEDICINE

## 2017-05-12 DIAGNOSIS — I482 Chronic atrial fibrillation, unspecified: Secondary | ICD-10-CM

## 2017-05-12 DIAGNOSIS — I4891 Unspecified atrial fibrillation: Secondary | ICD-10-CM | POA: Insufficient documentation

## 2017-05-12 LAB — PT/INR
INR: 2.75
PROTHROMBIN TIME: 33 seconds — ABNORMAL HIGH (ref 9.4–12.5)

## 2017-05-12 NOTE — Progress Notes (Signed)
05/12/17 1400   [facility] PT/INR   Date INR drawn 05/12/17   INR 2.8   New warfarin dosage INR is at goal   New weekly dosgae keep the same   Comments Check INR in one month   Initials Ahmir Bracken   Anticoag Indication VALVE   Goal INR 2.5.-3.5

## 2017-05-12 NOTE — Progress Notes (Signed)
Pt notified of INR results and verbalized understanding as to keeping with his current dose of warfarin. Pt takes 2 -5 mg tabs (10mg s total)) on Mon and Fri and 1-1/2 tabs (7.5mg s total) daily the rest of the week. Pt stated he would call back if Home health nurse could not draw his INR at home for a F/U appt.

## 2017-05-20 ENCOUNTER — Other Ambulatory Visit (HOSPITAL_BASED_OUTPATIENT_CLINIC_OR_DEPARTMENT_OTHER): Payer: Medicare Other | Attending: INTERNAL MEDICINE

## 2017-05-20 DIAGNOSIS — I482 Chronic atrial fibrillation, unspecified: Secondary | ICD-10-CM

## 2017-05-20 DIAGNOSIS — I480 Paroxysmal atrial fibrillation: Secondary | ICD-10-CM | POA: Insufficient documentation

## 2017-05-20 LAB — PT/INR
INR: 2.29
PROTHROMBIN TIME: 27.2 seconds — ABNORMAL HIGH (ref 9.4–12.5)

## 2017-05-21 ENCOUNTER — Ambulatory Visit (INDEPENDENT_AMBULATORY_CARE_PROVIDER_SITE_OTHER): Payer: Self-pay | Admitting: INTERNAL MEDICINE

## 2017-05-21 ENCOUNTER — Other Ambulatory Visit (INDEPENDENT_AMBULATORY_CARE_PROVIDER_SITE_OTHER): Payer: Self-pay | Admitting: INTERNAL MEDICINE

## 2017-05-21 NOTE — Nursing Note (Signed)
Pt notified of INR results and verbalized understanding. Pt has F/U lab appt on 06/21/2017.  [name], [place]  05/21/2017, 16:17

## 2017-05-21 NOTE — Progress Notes (Signed)
05/21/17 1100   [place] PT/INR   Date INR drawn 05/21/17   INR 2.3   New warfarin dosage INR is at goal   New weekly dosgae Keep the same   Comments Check INR in one month   Anticoag Indication Other (see below)   Goal INR 2.5-3.5

## 2017-05-24 ENCOUNTER — Ambulatory Visit (INDEPENDENT_AMBULATORY_CARE_PROVIDER_SITE_OTHER): Payer: Medicare Other | Admitting: INTERNAL MEDICINE

## 2017-05-24 VITALS — BP 118/64 | HR 78 | Temp 99.2°F | Ht 75.0 in | Wt 272.0 lb

## 2017-05-24 DIAGNOSIS — R609 Edema, unspecified: Secondary | ICD-10-CM

## 2017-05-24 DIAGNOSIS — I4891 Unspecified atrial fibrillation: Secondary | ICD-10-CM

## 2017-05-24 DIAGNOSIS — Z7901 Long term (current) use of anticoagulants: Secondary | ICD-10-CM

## 2017-05-24 NOTE — Nursing Note (Signed)
BP 118/64   Pulse 78   Temp 37.3 C (99.2 F) (Thermal Scan)   Ht 1.905 m (6\' 3" )   Wt 123.4 kg (272 lb)   SpO2 97%   BMI 34.00 kg/m     Jailee Jaquez, [place]  05/24/2017, 13:54

## 2017-05-24 NOTE — Progress Notes (Signed)
[facility]   [address]   [place], [place] [address]     [phone]  [phone] (fax)      INTERNAL MEDICINE, [facility]  [place] [address]    Follow Up Visit     Name: Kenneth Weeks MRN:  [id number]   Date: 05/24/2017 Age: 67 y.o.       SUBJECTIVE:   Kenneth Weeks is a 67 y.o. male for swelling. I saw him several weeks ago and increased his lasix to 40 mg BID for leg edema and scrotum swelling.     Morning dose: between 6-8AM     Afternoon dose: by 3 PM     Scrotum swelling is less than before.   No cramping   Not ligthheaded   No syncope     Home weights:   Weights at home 270-lbs today       Medications reviewed by me personally:   Warfarin 7.5mg  daily in the AM   Synthroid 241mcg daily in the AM   Metoprolol XL 100 mg daily   Inspra 25 mg daily   Lasix 40 mg daily     Spironolactone 50 mg daily -- no taking   Potassium daily -- OTC supplement   Magnesium tablet   Vitamin D3   Calcium OTC         I have reviewed and updated as appropriate the past medical, family and social history. 05/24/2017 as summarized below:  Past Medical History:   Diagnosis Date   . A-fib (CMS HCC)     Chronic anti-coagulant therapy; Hx cardioversions   . Abdominal hernia     Ventral hernia; Left Inguinal hernia   . Acute on chronic renal failure (CMS HCC)    . Biliary and gallbladder disorder     "Gallstones"   . Blood thinned due to long-term anticoagulant use    . Bruises easily    . Cancer (CMS HCC) 04/09/2017    Colon grade 0 surgery   . Congestive heart failure (CMS HCC) 04/09/2017    CHF 01/2017    . Diabetes mellitus (CMS [place])     type ii diet controlled   . Disorder of liver 04/09/2017    fatty liver   . Dysrhythmias     a fib   . Ejection fraction < 50%    . Heart murmur    . Hemorrhage of gastrointestinal tract 5/16   . Hemorrhagic shock (CMS HCC)    . Hepatic cirrhosis (CMS HCC) 30/16/0109    non alcoholic   . Hx of transfusion 04/09/2017    no  reaction   . Hydrocele    . Hypertension 07/10/2014   . Hypothyroid    . Marfan syndrome    . Neck problem 04/09/2017    LROM   . Non-healing non-surgical wound    . Obesity    . Palpitations    . Peripheral vascular disease (CMS [place]) 04/09/2017    no recent problems since starting lasix in 01/2017   . Syncope    . Thyroid disease     hypothyroid   . Transfusion history     s/p Multiple units PRC/FFP this adm.   . Type 2 diabetes mellitus (CMS [place]) 04/09/2017    diet controlled, HA1C 6.0   . Valvular disease 04/09/2017    [facility] aortic valve replacement-chronic anti-coagulant therapy   . Wears glasses      Past Surgical History:  Procedure Laterality Date   . Colonoscopy     . Hx colonoscopy     . Hx heart catheterization     . Hx heart valve surgery  1999   . Hx hemicolectomy Right 07/13/2014   . Hx tonsil and adenoidectomy     . Hx tonsillectomy     . Hx upper endoscopy  07/09/14   . Hx wisdom teeth extraction       Current Outpatient Medications   Medication Sig   . acetaminophen (TYLENOL) 325 mg Oral Tablet Take 2 Tabs (650 mg total) by mouth Every 6 hours as needed   . cholecalciferol, vitamin D3, 1,000 unit Oral Tablet Take 1,000 Units by mouth Once a day   . docusate sodium (COLACE) 100 mg Oral Capsule Take 1 Cap (100 mg total) by mouth Twice daily   . enoxaparin (LOVENOX) 120 mg/0.8 mL Subcutaneous Syringe 0.8 mL (120 mg total) by Subcutaneous route Every 12 hours   . eplerenone (INSPRA) 25 mg Oral Tablet Take 1 Tab (25 mg total) by mouth Once a day   . furosemide (LASIX) 40 mg Oral Tablet 1 TWICE PER DAY FOR SWELLING   . levothyroxine (SYNTHROID) 200 mcg Oral Tablet TAKE ONE TABLET BY MOUTH ONCE DAILY   . lisinopril (PRINIVIL) 5 mg Oral Tablet Take 1 Tab (5 mg total) by mouth Once a day   . MAGNESIUM CARBONATE ORAL Take by mouth Once a day   . methocarbamol (ROBAXIN) 500 mg Oral Tablet Take 1 Tab (500 mg total) by mouth Four times a day as needed   . metoprolol succinate (TOPROL-XL) 100 mg Oral Tablet  Sustained Release 24 hr Take 100 mg by mouth Once a day   . oxyCODONE (ROXICODONE) 5 mg Oral Tablet Take 1 Tab (5 mg total) by mouth Every 4 hours as needed   . vitamin B complex Oral Tablet Take 1 Tab by mouth Once a day   . warfarin (COUMADIN) 5 mg Oral Tablet Two tabs on Mon and Fri with 1.5 tabs all other days or as directed To be adjusted     Family Medical History:     Problem Relation (Age of Onset)    Cirrhosis Brother    Hypertension Mother    Lung Cancer Father            Social History     Socioeconomic History   . Marital status: Married     Spouse name: Not on file   . Number of children: Not on file   . Years of education: Not on file   . Highest education level: Not on file   Tobacco Use   . Smoking status: Never Smoker   . Smokeless tobacco: Never Used   Substance and Sexual Activity   . Alcohol use: No     Alcohol/week: 0.0 oz   . Drug use: No   Other Topics Concern   . Ability to Walk 1 Flight of Steps without SOB/CP Yes   . Ability to Walk 2 Flight of Steps without SOB/CP Yes   . Ability To Do Own ADL's Yes   . Other Activity Level Yes     Comment: walks a lot a work, up and down steps several times a day     Review of Systems: A comprehensive review of systems was negative.   Exceptions noted above      List of Current Health Care Providers   Care Team     PCP  Name Type Specialty Phone Number    Viona Gilmore, MD Physician [facility] [phone]          Care Team     Name Type Specialty Phone Number    Viona Gilmore, MD Physician INTERNAL MEDICINE [phone]                  Health Maintenance   Topic Date Due   . Adult Tdap-Td (1 - Tdap) 05/13/1967   . Shingles Vaccine (1 of 2) 05/13/1998   . Pneumococcal 65+ Years Low Risk (1 of 2 - PCV13) 05/12/2013   . Influenza Vaccine (Season Ended) 10/10/2017   . Depression Screening  03/08/2018   . Annual Wellness Exam  03/08/2018   . Colonoscopy  01/27/2026   . Hepatitis C screening antibody  Completed     Medicare  Wellness Assessment      Advance Directives (optional)                  Activities of Daily Living                   Urinary Incontinence Screen (Women >=65 only)       Cognitive Function Screen (1=Yes, 0=No)                                               Hearing Screen       Fall Risk Screen       Vision Screen           Depression Screen            OBJECTIVE:   There were no vitals taken for this visit.     Physical Exam     General -- alert     Repeat BP taken by me -- 138/80     HEENT -- PERRL, EOMI   Heart --mechanical click  Neck -- Thyroid is normal sized  Lungs -- clear; no crackles in bases  Abdomen --  wound vacuum in place and on   Neuro -- normal gait and tone; strength is 5/5  Skin -- some edema in BL legs   Psych -- pleasant and smiling; talking about the spring        Labs -- reviewed by me in the EMR  March 2019    WBC 10.5   Hgb 8.3   Platelets 238    Creatinine 0.93    Glucose 147     January 2019    Dr. Tish Men -- Cardiomyopathy/diastolic CHF     November 2018    Creatinine 1.22   LFT's okay     May 2018                 WBC 5.9                Hgb 15.7                Platelets 127                 Creatinine 1.02                A1c 5.7%                 CEA 1.17 January 2016  Colonic biopsy -- hyperplastic polyp only     September 2017                 Creatinine 1.21                 Hgb 15.18 August 2015                 Hgb 15.7                Creatinine 1.14                CT scan of chest -- minimal lung nodules, not changed; not big     March 2017                 INR 2.17 January 2015                 Dr. Burke Keels note: no evidence of any metastatic disease                 Dr. Dianah Field note: pT2N0 stage I colon cancer                 Hgb 11.8                 Creatinine 1.04                 Glucose 99                LFT's okay                CEA 1.5                CT chest/abdomen/pelvis -- no evidence of recurrent disease; some small lung nodules     June 2016                 [place] Note:                               Follow up colonoscopy in December 2016                               CT scan of chest/abdomen/pelvis in summer 2017     May 2016                Creatinine 1.27                Glucose 136 (not fasting)                 LFT's okay     March 2016                 Creatinine 1.02                TSH 1.23                LFT's okay     Health Maintenance Due   Topic Date Due   . Adult Tdap-Td (1 - Tdap) 05/13/1967   . Shingles Vaccine (1 of 2) 05/13/1998   . Pneumococcal 65+ Years Low Risk (1 of 2 - PCV13) 05/12/2013      ASSESSMENT & PLAN:    Identified Risk Factors/ Recommended Actions   Peripheral edema after surgery: secondary to stasis -- lasix to 40 mg BID;  Dropping pounds. Check labs to see about tolerance of the BID lasix     Diabetes -- doing fantastic     Ventral hernia repair  -- has a wound vacuum in place     S/P laporoscopic right hemicolectomy with stapled anastomosis for stage II colon cancer -- had a clean colonoscopy in December 2016 and December 2017. He will likely not need another scope until 6060     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 - he is back on Toprol    Hypothyroidism -- on replacement    Obesity -- he is generally doing well overall     Vision -- glasses; no changes     Hearing issues -- not limited     Exercise --  He is walking on a regular basis    Need for HIV or hepatitis C screening -- no reason to screen     Tobacco abuse screening -- none at all     Alcohol and drug abuse screening -- none at all     Depression/anxiety screening -- he denies any at this time     Screening for colorectal cancer -- he is not due for screening     Immunizations -- I recommend a yearly flu shot       DISPOSITION:  Elevate lasix to 40 mg BID  I put in orders for CMP, CBC and INR -- to be done on Monday at home from home health nurse       No orders of the defined types were placed in this encounter.         The patient has  been educated about risk factors and recommended preventive care. Written Prevention Plan completed/ updated and given to patient (see After Visit Summary).    No follow-ups on file.    Viona Gilmore, MD  [facility] INTERNAL MEDICINE  INTERNAL MEDICINE, [facility]  [address]  [place] [address]  [phone]

## 2017-05-28 ENCOUNTER — Ambulatory Visit: Payer: Medicare Other | Attending: Surgery | Admitting: Surgery

## 2017-05-28 ENCOUNTER — Encounter (INDEPENDENT_AMBULATORY_CARE_PROVIDER_SITE_OTHER): Payer: Self-pay | Admitting: Surgery

## 2017-05-28 VITALS — BP 128/82 | HR 82 | Temp 97.9°F | Ht 75.5 in | Wt 268.1 lb

## 2017-05-28 DIAGNOSIS — K432 Incisional hernia without obstruction or gangrene: Secondary | ICD-10-CM | POA: Insufficient documentation

## 2017-05-28 DIAGNOSIS — Z978 Presence of other specified devices: Secondary | ICD-10-CM | POA: Insufficient documentation

## 2017-05-28 DIAGNOSIS — Z5189 Encounter for other specified aftercare: Secondary | ICD-10-CM

## 2017-05-28 DIAGNOSIS — Z48815 Encounter for surgical aftercare following surgery on the digestive system: Secondary | ICD-10-CM | POA: Insufficient documentation

## 2017-05-28 NOTE — Progress Notes (Signed)
[facility]   Clinic Visit    Patient: Kenneth Weeks  D.O.B.: 1950/03/18  MRN# [id number]  Date of Service: 05/28/2017    Subjective:  Kenneth Weeks returns to clinic today after recent open ventral incisional hernia repair. This was complicated by post-operative hematoma which was very large and required return to the OR for evacuation of clot and removal of the mesh on 04/23/17. He reports doing well overall today. Wound vac is intact. He denies pain, fever/chills, n/v tolerating diet with increased intact of protein. He is having BM daily.     Objective:    BP 128/82   Pulse 82   Temp 36.6 C (97.9 F) (Thermal Scan)   Ht 1.918 m (6' 3.5")   Wt 121.6 kg (268 lb 1.3 oz)   SpO2 94%   BMI 33.07 kg/m     Assessment:    ICD-10-CM    1. Encounter for wound re-check Z51.89       Type: Surgical  Location:  Mid abdomen  Length: 5 cm   Width: 3 cm   Depth: 3 cm  Undermining/Tunneling: none  Wound Base %: yellow fibrin  Wound Edges: smooth. Granulation tissue  Drainage amt: Small amount  Serosanguineous drainage with odor Absent  Peri-wd Skin: healing bruising, skin irritation     2 piece of black foam was used. Secured in place with adhesive per instructions. Wound VAC set to -125 mmHg continuous suction.     Plan:  Continue with NPWT with dressing changes M-W-F.   RTC in 3 weeks for wound check.    Patient was seen in conjunction with cosigning faculty, Dr Mel Almond.  Lynnea Ferrier, APRN-FNP-BC  05/28/2017 , 10:44     I personally saw and examined the patient. See Nurse Practitioner's note for additional details. My findings are as above.  Recovering well.  Small defect remaining with heathy granulation tissue everywhere except the base.  Continue wound vac.      Leticia Clas, MD

## 2017-05-31 ENCOUNTER — Other Ambulatory Visit (HOSPITAL_BASED_OUTPATIENT_CLINIC_OR_DEPARTMENT_OTHER): Payer: Self-pay

## 2017-05-31 ENCOUNTER — Ambulatory Visit (INDEPENDENT_AMBULATORY_CARE_PROVIDER_SITE_OTHER): Payer: Self-pay | Admitting: INTERNAL MEDICINE

## 2017-05-31 ENCOUNTER — Ambulatory Visit (HOSPITAL_BASED_OUTPATIENT_CLINIC_OR_DEPARTMENT_OTHER): Payer: Medicare Other | Attending: INTERNAL MEDICINE

## 2017-05-31 DIAGNOSIS — I110 Hypertensive heart disease with heart failure: Secondary | ICD-10-CM | POA: Insufficient documentation

## 2017-05-31 DIAGNOSIS — C189 Malignant neoplasm of colon, unspecified: Secondary | ICD-10-CM

## 2017-05-31 DIAGNOSIS — E119 Type 2 diabetes mellitus without complications: Secondary | ICD-10-CM | POA: Insufficient documentation

## 2017-05-31 DIAGNOSIS — I4891 Unspecified atrial fibrillation: Secondary | ICD-10-CM | POA: Insufficient documentation

## 2017-05-31 DIAGNOSIS — I10 Essential (primary) hypertension: Secondary | ICD-10-CM

## 2017-05-31 DIAGNOSIS — I509 Heart failure, unspecified: Secondary | ICD-10-CM | POA: Insufficient documentation

## 2017-05-31 LAB — CBC W/AUTO DIFF
BASOPHIL #: 0.1 x10ˆ3/uL (ref 0.00–0.10)
BASOPHIL %: 1 % (ref 0–3)
EOSINOPHIL #: 0.1 10*3/uL (ref 0.00–0.50)
EOSINOPHIL #: 0.1 x10ˆ3/uL (ref 0.00–0.50)
EOSINOPHIL %: 3 % (ref 0–5)
HCT: 31.4 % — ABNORMAL LOW (ref 40.0–50.0)
HGB: 10 g/dL — ABNORMAL LOW (ref 13.5–18.0)
LYMPHOCYTE #: 0.8 x10ˆ3/uL — ABNORMAL LOW (ref 1.00–4.80)
LYMPHOCYTE %: 16 % (ref 15–43)
MCH: 28 pg (ref 27.5–33.2)
MCHC: 32 g/dL (ref 32.0–36.0)
MCV: 87.5 fL (ref 82.0–97.0)
MONOCYTE #: 0.3 x10ˆ3/uL (ref 0.20–0.90)
MONOCYTE %: 6 % (ref 5–12)
MPV: 7.9 fL (ref 7.4–10.5)
NEUTROPHIL #: 3.9 x10ˆ3/uL (ref 1.50–6.50)
NEUTROPHIL %: 74 % (ref 43–76)
PLATELETS: 258 x10ˆ3/uL (ref 150–450)
RBC: 3.59 x10ˆ6/uL — ABNORMAL LOW (ref 4.40–5.80)
RDW: 17.4 % — ABNORMAL HIGH (ref 11.0–16.0)
WBC: 5.2 x10ˆ3/uL (ref 4.0–11.0)

## 2017-05-31 LAB — COMPREHENSIVE METABOLIC PROFILE - BMC/JMC ONLY
ALBUMIN/GLOBULIN RATIO: 0.8 (ref 0.8–2.0)
ALBUMIN: 3 g/dL — ABNORMAL LOW (ref 3.5–5.0)
ALKALINE PHOSPHATASE: 61 U/L (ref 38–126)
ALT (SGPT): 16 U/L — ABNORMAL LOW (ref 17–63)
ANION GAP: 7 mmol/L (ref 3–11)
AST (SGOT): 22 U/L (ref 15–41)
BILIRUBIN TOTAL: 0.8 mg/dL (ref 0.3–1.2)
BUN/CREA RATIO: 17 (ref 6–22)
BUN: 17 mg/dL (ref 6–20)
CALCIUM: 8.3 mg/dL — ABNORMAL LOW (ref 8.8–10.2)
CHLORIDE: 100 mmol/L — ABNORMAL LOW (ref 101–111)
CO2 TOTAL: 26 mmol/L (ref 22–32)
CREATININE: 1 mg/dL (ref 0.61–1.24)
ESTIMATED GFR: >60 mL/min/1.73mˆ2 (ref >60)
GLUCOSE: 105 mg/dL (ref 70–110)
POTASSIUM: 3.9 mmol/L (ref 3.4–5.1)
PROTEIN TOTAL: 7 g/dL (ref 6.4–8.3)
SODIUM: 133 mmol/L — ABNORMAL LOW (ref 136–145)

## 2017-05-31 LAB — PT/INR
INR: 2.52
PROTHROMBIN TIME: 30.1 seconds — ABNORMAL HIGH (ref 9.4–12.5)

## 2017-05-31 NOTE — Progress Notes (Signed)
Pt notified of INR results and verbalized understanding. Pt has F/U lab appt on 06/21/2017.  [name], [place]  05/31/2017, 13:13

## 2017-05-31 NOTE — Progress Notes (Signed)
05/31/17 1000   [facility]/INR   Date INR drawn 05/31/17   INR 2.5   New warfarin dosage INR is at goal   New weekly dosgae Keep the same   Comments Check INR in one month   Initials Jorian [place]   Anticoag Indication valve   Goal INR 2.5-3.5

## 2017-06-07 ENCOUNTER — Ambulatory Visit (INDEPENDENT_AMBULATORY_CARE_PROVIDER_SITE_OTHER): Payer: Self-pay | Admitting: INTERNAL MEDICINE

## 2017-06-07 ENCOUNTER — Other Ambulatory Visit (HOSPITAL_BASED_OUTPATIENT_CLINIC_OR_DEPARTMENT_OTHER): Payer: Medicare Other | Attending: INTERNAL MEDICINE

## 2017-06-07 DIAGNOSIS — I4891 Unspecified atrial fibrillation: Secondary | ICD-10-CM | POA: Insufficient documentation

## 2017-06-07 LAB — PT/INR
INR: 2.4
INR: 2.4
PROTHROMBIN TIME: 28.6 seconds — ABNORMAL HIGH (ref 9.4–12.5)

## 2017-06-07 NOTE — Progress Notes (Signed)
06/07/17 1500   [facility] PT/INR   Date INR drawn 06/07/17   INR 2.4   New warfarin dosage INR is fine   New weekly dosgae keep the same   Comments Check INR in one month   Initials Gracey Tolle   Anticoag Indication VALVE   Goal INR 2.5-3.5

## 2017-06-07 NOTE — Progress Notes (Signed)
Pt notified of INR results and verbalized understanding as to keeping with his current dose of warfrin. Pt states he will have to call back after he talks to his nurse about making a F/U lab appt.

## 2017-06-08 NOTE — Progress Notes (Signed)
[place] [place] [id number]  [phone]    Date: 06/09/2017  Patient Name: Kenneth Weeks  MRN#: [id number]  DOB: 08/31/1950    Provider: Barnet Glasgow, MD  PCP: Viona Gilmore, MD      Reason for visit: Arrhythmia    History:     Kenneth Weeks is a 67 y.o. male here today for a 3 month follow up on his Afib, AS s/p AVR, Cardiomyopathy/CHF, and Pulmonary HTN. He was last seen 03/15/17 with no complaints. His HR was 98 bpm, therefore Toprol was increased to 200 mg QD for better rate control. Since that time he has had an admission for a strangulated ventral hernia ultimately requiring surgical repair which was complicated by bleeding issues due to his need to be on [facility] for his mechanical AV.  Ultimately he was discharged with a wound vac. Today he reports he has been recovering and almost feels back to baseline.  He has been on iron and his anemia is improving.  He is back on Coumadin.  Some of his BP meds were held at d/c due to low BPs while in the hospital.  He had significant edema while in the hospital which has resolved.  His BP is slightly elevated today and runs 115-120's/70-80's at home per report. No c/o any CP, palpitations, syncope/presyncope, orthopnea/PND, or claudication.    Past Medical History:     Past Medical History:   Diagnosis Date   . A-fib (CMS HCC)     Chronic anti-coagulant therapy; Hx cardioversions   . Abdominal hernia     Ventral hernia; Left Inguinal hernia   . Acute on chronic renal failure (CMS HCC)    . Biliary and gallbladder disorder     "Gallstones"   . Blood thinned due to long-term anticoagulant use    . Bruises easily    . Cancer (CMS HCC) 04/09/2017    Colon grade 0 surgery   . Congestive heart failure (CMS HCC) 04/09/2017    CHF 01/2017    . Diabetes mellitus (CMS [place])     type ii diet controlled   . Disorder of liver 04/09/2017    fatty liver   . Dysrhythmias     a fib   . Ejection fraction < 50%       . Heart murmur    . Hemorrhage of gastrointestinal tract 5/16   . Hemorrhagic shock (CMS HCC)    . Hepatic cirrhosis (CMS HCC) 08/65/7846    non alcoholic   . Hx of transfusion 04/09/2017    no reaction   . Hydrocele    . Hypertension 07/10/2014   . Hypothyroid    . Marfan syndrome    . Neck problem 04/09/2017    LROM   . Non-healing non-surgical wound    . Obesity    . Palpitations    . Peripheral vascular disease (CMS [place]) 04/09/2017    no recent problems since starting lasix in 01/2017   . Syncope    . Thyroid disease     hypothyroid   . Transfusion history     s/p Multiple units PRC/FFP this adm.   . Type 2 diabetes mellitus (CMS [place]) 04/09/2017    diet controlled, HA1C 6.0   . Valvular disease 04/09/2017    St Judes aortic valve replacement-chronic anti-coagulant therapy   . Wears glasses          Past  Surgical History:     Past Surgical History:   Procedure Laterality Date   . Colonoscopy     . Hx colonoscopy     . Hx heart catheterization     . Hx heart valve surgery  1999   . Hx hemicolectomy Right 07/13/2014   . Hx tonsil and adenoidectomy     . Hx tonsillectomy     . Hx upper endoscopy  07/09/14   . Hx wisdom teeth extraction       Allergies:     Allergies   Allergen Reactions   . Allegra [Fexofenadine]  Other Adverse Reaction (Add comment)     "retain fluid"   . Antihistamines - Alkylamine      Pt states antihistamines cause him to retain fluid    . Antihistamines - Ethanolamine        Medications:     Current Outpatient Medications   Medication Sig   . acetaminophen (TYLENOL) 325 mg Oral Tablet Take 2 Tabs (650 mg total) by mouth Every 6 hours as needed   . cholecalciferol, vitamin D3, 1,000 unit Oral Tablet Take 1,000 Units by mouth Once a day   . docusate sodium (COLACE) 100 mg Oral Capsule Take 1 Cap (100 mg total) by mouth Twice daily   . enoxaparin (LOVENOX) 120 mg/0.8 mL Subcutaneous Syringe 0.8 mL (120 mg total) by Subcutaneous route Every 12 hours (Patient not taking: Reported on 06/09/2017)   .  eplerenone (INSPRA) 25 mg Oral Tablet Take 1 Tab (25 mg total) by mouth Once a day   . furosemide (LASIX) 40 mg Oral Tablet 1 TWICE PER DAY FOR SWELLING (Patient taking differently: 40 mg Once a day 1 TWICE PER DAY FOR SWELLING)   . levothyroxine (SYNTHROID) 200 mcg Oral Tablet TAKE ONE TABLET BY MOUTH ONCE DAILY   . lisinopril (PRINIVIL) 5 mg Oral Tablet Take 1 Tab (5 mg total) by mouth Once a day   . MAGNESIUM CARBONATE ORAL Take by mouth Once a day   . methocarbamol (ROBAXIN) 500 mg Oral Tablet Take 1 Tab (500 mg total) by mouth Four times a day as needed   . Metoprolol Succinate (TOPROL-XL) 200 mg Oral Tablet Sustained Release 24 hr Take 1 Tab (200 mg total) by mouth Once a day   . oxyCODONE (ROXICODONE) 5 mg Oral Tablet Take 1 Tab (5 mg total) by mouth Every 4 hours as needed   . vitamin B complex Oral Tablet Take 1 Tab by mouth Once a day   . warfarin (COUMADIN) 5 mg Oral Tablet Two tabs on Mon and Fri with 1.5 tabs all other days or as directed To be adjusted       Family History:     Family Medical History:     Problem Relation (Age of Onset)    Cirrhosis Brother    Hypertension (High Blood Pressure) Mother    Lung Cancer Father              Social History:     Social History     Socioeconomic History   . Marital status: Married     Spouse name: Not on file   . Number of children: Not on file   . Years of education: Not on file   . Highest education level: Not on file   Tobacco Use   . Smoking status: Never Smoker   . Smokeless tobacco: Never Used   Substance and Sexual Activity   . Alcohol use: No  Alcohol/week: 0.0 oz   . Drug use: No   Other Topics Concern   . Ability to Walk 1 Flight of Steps without SOB/CP Yes   . Ability to Walk 2 Flight of Steps without SOB/CP Yes   . Ability To Do Own ADL's Yes   . Other Activity Level Yes     Comment: walks a lot a work, up and down steps several times a day       Review of Systems:     All other systems were reviewed and are negative other than noted in the  HPI.    Physical Exam:     Vitals:    06/09/17 1011   BP: (!) 140/95   Pulse: 96   Resp: 16   SpO2: 98%   Weight: 121 kg (266 lb 12.8 oz)   Body mass index is 32.91 kg/m.    General:No acute distress  HEENT: Moist mucous membranes  Neck: Supple, no JVD, no bruits  Heart/Cardiovascular:  Irregular  No murmur heard today, crisp valve click  2+ radial pulses  Respiratory/Lungs: Non-labored, clear to auscultation  Abdominal/Gastrointestinal: Soft, non-tender, +wound vac  Extremities: Warm and well-perfused, chronic 1+ LE edema with skin thickening  Skin: No rash  Neurological: Alert and oriented x3, grossly nonfocal  Psych: Normal affect    Cardiovascular Workup:     Hx of DCCV    AVR 1999 ([place])  1. St. Judes mechanical prosthetic AV.    Echo 07/11/14  1. Left ventricle: The LV systolic function is normal. LV ejection fraction is 55% - 60%. LV diastolic function is unable to be assessed. The study was not technically sufficient to allow evaluation of LV diastolic dysfunction due to atrial fibrillation. There was no evidence of increased ventricular filling pressure by Doppler parameters.  2. Tricuspid valve: Mild-moderate tricuspid valve regurgitation.  3. Left atrium: The left atrial size is moderately dilated.    Echo 12/14/16  1. Mildly dilated left ventricle. Mildly depressed left ventricular systolic function. LV Ejection Fraction is 45 %. Normal geometry. Left ventricular diastolic function could not be assessed due to the presence of atrial fibrillation during the study.  2. Resting Segmental Wall Motion Analysis: Total wall motion score is 2.00. There is global left ventricular hypokinesis.  3. Moderately dilated right ventricle. Mildly depressed right ventricular systolic function. RV systolic pressure is consistent with at least mild-to-moderate pulmonary hypertension (TR peak gradient 41 mmHg). RA pressure could not be assessed as the IVC is not well visualized.  4. Severely dilated left  atrium.  5. Moderately dilated right atrium.  6. There is mild primary mitral valve regurgitation.  7. A mechanical prosthetic valve is present in the aortic position. The aortic valve prosthesis appears well seated. The aortic prosthesis demonstrates a moderately increased transvalvular gradient for valve type and size. This is suggestive of moderate prosthetic aortic stenosis.    Nuclear Stress Test 01/20/17  1.The patient had decreased perfusion both at rest and stress in the inferior lateral wall and this was considered a mild area of moderate intensity, consistent with diaphragmatic attenuation artifact. There was no evidence of ischemia noted.   2.The patient had septal akinesis and mild global hypokinesis noted on gated wall motion analysis with an ejection fraction moderately decreased at 36%.  3.The patient was noted to have a dilated left ventricle, both at rest and stress.    Assessment and Plan:     Kenneth Weeks is a 67 y.o. male with  AS:s/p St. Judes AVR in 1999. 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  - order Echo to reassess AS in November  -on Coumadin with goal INR 2.5-3.5  - not on aspirin given h/o GI bleed    Cardiomyopathy/Diastolic CHF:EF 88-89% on 07/11/14 Echo and now EF 45%, mildly dilated LV, and global LV hypokinesis on 12/14/16 Echo.Nuc. Stress test 01/20/17- No evidence of ischemia noted, septal akinesis and mild global hypokinesis with an EF of 36%, and a dilated LV, bot at rest and stress.  - intolerant to Spironolactone(gynecomastia)  - continue on Toprol-XL; increase to 200 mg daily at next refill for better HR control per prior plan  - resume home dose of Lisinopril  - resume home dose of Eplerenone  - continue Lasix daily at this time, instructed to take an extra dose of Lasix if increase in weight or worsening LE edema    A-Fib:Hx of DCCV. Echo 12/14/16- Presence of A-Fib, severely dilated LA, and moderately  dilated RA.  - stable SOB/DOE   - HR today is 96  - CHA2DS2-VASc score of 3 (age + HTN + CMP/CHF)  - after 5 years of A Fib it is not likely that we will be able to successfully rhythm control him  - continue Coumadin for stroke prophylaxis  - continue Toprol for rate control; will increase as above    Pulmonary VQX:[id number] dilated RV and mild-to-moderate pulmonary HTN on 12/14/16 Echo.  - stable SOB/DOE    KLK:[id number] elevated today, well controlled at home    Lipids:FLP 07/10/14- Total 74, Trig. 115, HDL 18, LDL 33  - not on statin therapy  - encouraged heart healthy diet and exercise    Nonsmoker    Hx ofMarfan's Syndrome(?):  - unclear how diagnosed; no h/o ectopia lentis, aortic root normal in size on recent echo      Follow up in 6 months      Thank you for allowing me to participate in the care of your patient. Please feel free to contact me if there are further questions.     I am scribing for, and in the presence of Dr. Barnet Glasgow, MD for services provided on 06/09/2017.  Johnella Moloney, SCRIBE  Johnella Moloney, [place]  06/09/2017, 10:23    I personally performed the services described in this documentation, as scribed in my presence, and it is both accurate and complete.    Barnet Glasgow, MD    Barnet Glasgow, MD  06/09/2017, 10:40

## 2017-06-09 ENCOUNTER — Ambulatory Visit (INDEPENDENT_AMBULATORY_CARE_PROVIDER_SITE_OTHER): Payer: Medicare Other | Admitting: Cardiovascular Disease

## 2017-06-09 ENCOUNTER — Encounter (INDEPENDENT_AMBULATORY_CARE_PROVIDER_SITE_OTHER): Payer: Self-pay | Admitting: Cardiovascular Disease

## 2017-06-09 VITALS — BP 140/95 | HR 96 | Resp 16 | Wt 266.8 lb

## 2017-06-09 DIAGNOSIS — I350 Nonrheumatic aortic (valve) stenosis: Secondary | ICD-10-CM

## 2017-06-09 DIAGNOSIS — Z952 Presence of prosthetic heart valve: Secondary | ICD-10-CM

## 2017-06-09 DIAGNOSIS — I480 Paroxysmal atrial fibrillation: Secondary | ICD-10-CM

## 2017-06-09 DIAGNOSIS — I272 Pulmonary hypertension, unspecified: Secondary | ICD-10-CM

## 2017-06-09 DIAGNOSIS — I10 Essential (primary) hypertension: Secondary | ICD-10-CM

## 2017-06-09 MED ORDER — METOPROLOL SUCCINATE ER 200 MG TABLET,EXTENDED RELEASE 24 HR
200.0000 mg | ORAL_TABLET | Freq: Every day | ORAL | 3 refills | Status: DC
Start: 2017-06-09 — End: 2018-09-08

## 2017-06-15 NOTE — Addendum Note (Signed)
Addendum  created 06/15/17 1542 by Guadalupe Maple, MD    Intraprocedure LDAs edited, LDA properties accepted

## 2017-06-21 ENCOUNTER — Ambulatory Visit: Payer: Medicare Other | Attending: INTERNAL MEDICINE

## 2017-06-21 DIAGNOSIS — R609 Edema, unspecified: Principal | ICD-10-CM | POA: Insufficient documentation

## 2017-06-21 DIAGNOSIS — Z7901 Long term (current) use of anticoagulants: Secondary | ICD-10-CM | POA: Insufficient documentation

## 2017-06-21 LAB — CBC WITH DIFF
BASOPHIL #: 0 x10ˆ3/uL (ref 0.00–0.10)
BASOPHIL %: 1 % (ref 0–3)
EOSINOPHIL #: 0.1 x10ˆ3/uL (ref 0.00–0.50)
EOSINOPHIL %: 2 % (ref 0–5)
HCT: 37.6 % — ABNORMAL LOW (ref 40.0–50.0)
HGB: 11.9 g/dL — ABNORMAL LOW (ref 13.5–18.0)
LYMPHOCYTE #: 0.7 x10ˆ3/uL — ABNORMAL LOW (ref 1.00–4.80)
LYMPHOCYTE %: 15 % (ref 15–43)
MCH: 27.8 pg (ref 27.5–33.2)
MCHC: 31.7 g/dL — ABNORMAL LOW (ref 32.0–36.0)
MCV: 87.7 fL (ref 82.0–97.0)
MONOCYTE #: 0.3 x10ˆ3/uL (ref 0.20–0.90)
MONOCYTE %: 6 % (ref 5–12)
MPV: 8.7 fL (ref 7.4–10.5)
NEUTROPHIL #: 3.6 x10ˆ3/uL (ref 1.50–6.50)
NEUTROPHIL %: 76 % (ref 43–76)
PLATELETS: 222 x10ˆ3/uL (ref 150–450)
RBC: 4.29 x10ˆ6/uL — ABNORMAL LOW (ref 4.40–5.80)
RDW: 19.4 % — ABNORMAL HIGH (ref 11.0–16.0)
WBC: 4.8 x10ˆ3/uL (ref 4.0–11.0)

## 2017-06-21 LAB — COMPREHENSIVE METABOLIC PNL, FASTING
ALBUMIN/GLOBULIN RATIO: 0.8 (ref 0.8–2.0)
ALBUMIN: 3.4 g/dL — ABNORMAL LOW (ref 3.5–5.0)
ALKALINE PHOSPHATASE: 75 U/L (ref 38–126)
ALT (SGPT): 27 U/L (ref 17–63)
ANION GAP: 4 mmol/L (ref 3–11)
AST (SGOT): 27 U/L (ref 15–41)
BILIRUBIN TOTAL: 0.8 mg/dL (ref 0.3–1.2)
BUN/CREA RATIO: 21 (ref 6–22)
BUN: 21 mg/dL — ABNORMAL HIGH (ref 6–20)
CALCIUM: 9.2 mg/dL (ref 8.8–10.2)
CHLORIDE: 102 mmol/L (ref 101–111)
CO2 TOTAL: 30 mmol/L (ref 22–32)
CREATININE: 0.98 mg/dL (ref 0.61–1.24)
ESTIMATED GFR: >60 mL/min/1.73mˆ2 (ref >60)
GLUCOSE: 141 mg/dL — ABNORMAL HIGH (ref 70–110)
POTASSIUM: 4.6 mmol/L (ref 3.4–5.1)
PROTEIN TOTAL: 7.5 g/dL (ref 6.4–8.3)
SODIUM: 136 mmol/L (ref 136–145)

## 2017-06-21 LAB — PT/INR
INR: 2.28
PROTHROMBIN TIME: 27.1 seconds — ABNORMAL HIGH (ref 9.4–12.5)

## 2017-06-23 ENCOUNTER — Telehealth (INDEPENDENT_AMBULATORY_CARE_PROVIDER_SITE_OTHER): Payer: Self-pay | Admitting: INTERNAL MEDICINE

## 2017-06-23 NOTE — Telephone Encounter (Signed)
[facility] Kenneth Weeks   [phone]  His wound vac  Was supposed to come off today.   They need orders on how to take care of the area.  Kim isn't sure which Dr takes a takes care of this

## 2017-06-23 NOTE — Telephone Encounter (Signed)
I don't know that I can weigh-in on this matter.     The surgeon who put this device on, this is the person to contact about this.     Thanks

## 2017-06-23 NOTE — Telephone Encounter (Signed)
Notified Kim and she has a call in to Dr Mel Almond

## 2017-06-25 ENCOUNTER — Encounter (INDEPENDENT_AMBULATORY_CARE_PROVIDER_SITE_OTHER): Payer: Self-pay | Admitting: Surgery

## 2017-06-25 ENCOUNTER — Ambulatory Visit: Payer: Medicare Other | Attending: Surgery | Admitting: Surgery

## 2017-06-25 VITALS — BP 110/70 | HR 98 | Temp 97.2°F | Ht 75.0 in | Wt 265.2 lb

## 2017-06-25 DIAGNOSIS — S31109D Unspecified open wound of abdominal wall, unspecified quadrant without penetration into peritoneal cavity, subsequent encounter: Secondary | ICD-10-CM | POA: Insufficient documentation

## 2017-06-25 DIAGNOSIS — Z9889 Other specified postprocedural states: Secondary | ICD-10-CM

## 2017-06-25 DIAGNOSIS — Z8719 Personal history of other diseases of the digestive system: Secondary | ICD-10-CM | POA: Insufficient documentation

## 2017-06-25 DIAGNOSIS — K432 Incisional hernia without obstruction or gangrene: Secondary | ICD-10-CM

## 2017-06-25 NOTE — Progress Notes (Signed)
Mr. Pugh returns to clinic for a recheck of his abdominal wall wound.  He had initially had a ventral hernia repair complicated by a large hematoma and wound vac placement.  His wound vac has been recently removed and they have been placing alginate in the wound and covering with gauze.  No fever or chills.  Rash to skin is improving.      BP 110/70   Pulse 98   Temp 36.2 C (97.2 F)   Ht 1.905 m (6\' 3" )   Wt 120.3 kg (265 lb 3.4 oz)   SpO2 91%   BMI 33.15 kg/m     0.26mm diameter area of granulation tissue in umbilicus.  No depth to wound.  Not draining.  [id number] rash above umbilicus.    A/P-  67yo male s/p ventral hernia repair complicated by hematoma.  His wound is almost entirely healed.  Recommend no dressings to the wound at this point.  It should be left open to air.  Cover with gauze if it drains.  -  Released from clinic.  -  F/u prn

## 2017-07-06 ENCOUNTER — Other Ambulatory Visit (HOSPITAL_BASED_OUTPATIENT_CLINIC_OR_DEPARTMENT_OTHER): Payer: Self-pay

## 2017-07-09 NOTE — Addendum Note (Signed)
Addendum  created 07/09/17 1425 by Guadalupe Maple, MD    Intraprocedure LDAs edited, LDA properties accepted

## 2017-09-06 ENCOUNTER — Ambulatory Visit (INDEPENDENT_AMBULATORY_CARE_PROVIDER_SITE_OTHER): Payer: Medicare Other | Admitting: INTERNAL MEDICINE

## 2017-09-06 ENCOUNTER — Ambulatory Visit: Payer: Medicare Other | Attending: INTERNAL MEDICINE

## 2017-09-06 ENCOUNTER — Encounter (INDEPENDENT_AMBULATORY_CARE_PROVIDER_SITE_OTHER): Payer: Self-pay | Admitting: INTERNAL MEDICINE

## 2017-09-06 VITALS — BP 124/72 | HR 82 | Temp 98.2°F | Ht 75.0 in | Wt 276.0 lb

## 2017-09-06 DIAGNOSIS — Z85038 Personal history of other malignant neoplasm of large intestine: Secondary | ICD-10-CM

## 2017-09-06 DIAGNOSIS — I482 Chronic atrial fibrillation, unspecified: Secondary | ICD-10-CM

## 2017-09-06 DIAGNOSIS — Z7901 Long term (current) use of anticoagulants: Secondary | ICD-10-CM

## 2017-09-06 DIAGNOSIS — E039 Hypothyroidism, unspecified: Secondary | ICD-10-CM

## 2017-09-06 DIAGNOSIS — I10 Essential (primary) hypertension: Secondary | ICD-10-CM

## 2017-09-06 DIAGNOSIS — I4891 Unspecified atrial fibrillation: Secondary | ICD-10-CM

## 2017-09-06 DIAGNOSIS — E119 Type 2 diabetes mellitus without complications: Secondary | ICD-10-CM

## 2017-09-06 DIAGNOSIS — I5030 Unspecified diastolic (congestive) heart failure: Secondary | ICD-10-CM

## 2017-09-06 LAB — PT/INR
INR: 2.81
PROTHROMBIN TIME: 33.7 s — ABNORMAL HIGH (ref 9.4–12.5)

## 2017-09-06 MED ORDER — LEVOTHYROXINE 200 MCG TABLET
ORAL_TABLET | ORAL | 3 refills | Status: DC
Start: 2017-09-06 — End: 2018-11-28

## 2017-09-06 MED ORDER — FUROSEMIDE 40 MG TABLET
40.00 mg | ORAL_TABLET | Freq: Two times a day (BID) | ORAL | 3 refills | Status: DC
Start: 2017-09-06 — End: 2017-12-30

## 2017-09-06 MED ORDER — LISINOPRIL 5 MG TABLET
5.0000 mg | ORAL_TABLET | Freq: Every day | ORAL | 3 refills | Status: DC
Start: 2017-09-06 — End: 2017-12-30

## 2017-09-06 NOTE — Progress Notes (Signed)
[facility]   [address]   [place], [place] [address]     [phone]  [phone] (fax)      INTERNAL MEDICINE, [facility]  [place] [address]    Follow Up Visit on Multiple Medical Problems      Name: Kenneth Weeks MRN:  [id number]   Date: 09/06/2017 Age: 67 y.o.       SUBJECTIVE:   [name] is a 67 y.o. male for a host of medical problems. This is a planned follow-up visit. I saw [name] back in February 2019.     No flu this winter     Healthcare Since I Saw Him Last   ED -- no visits in 2019   Urgent care -- no visits in 2019   New medications -- no new medications from other providers   Surgeries or procedures -- nothing in 2019 thus far   Accidents in a car/truck -- no fender-bender; nothing at all       Vision -- hear wears glasses all the time; no acute trauma     Hearing -- no new trauma; ringing or pain     Chewing/swallowing -- no choking on solids/liquids     Bowels -- no constipation on a regular basis     Morning lasix dose: between 6-8AM     Home weights:   Weights at home 270-lbs today       Medications reviewed by me personally:   Warfarin 7.5mg  daily in the AM     Synthroid 28mcg daily in the AM   Metoprolol XL 200 mg daily   Lisinopril 5 mg daily   Inspra 25 mg daily   Lasix 40 mg daily in the AM   Colace daily     Spironolactone 50 mg daily -- no taking   Potassium daily -- OTC supplement   Magnesium tablet   Vitamin D3   Calcium OTC     Social History Update   Went to [place], [place]; [place]; [facility]      Family History Update   No new illnesses     I have reviewed and updated as appropriate the past medical, family and social history. 09/06/2017 as summarized below:  Past Medical History:   Diagnosis Date   . A-fib (CMS HCC)     Chronic anti-coagulant therapy; Hx cardioversions   . Abdominal hernia     Ventral hernia; Left Inguinal hernia   . Acute on chronic renal failure (CMS HCC)    .  Biliary and gallbladder disorder     "Gallstones"   . Blood thinned due to long-term anticoagulant use    . Bruises easily    . Cancer (CMS HCC) 04/09/2017    Colon grade 0 surgery   . Congestive heart failure (CMS HCC) 04/09/2017    CHF 01/2017    . Diabetes mellitus (CMS [place])     type ii diet controlled   . Disorder of liver 04/09/2017    fatty liver   . Dysrhythmias     a fib   . Ejection fraction < 50%    . Heart murmur    . Hemorrhage of gastrointestinal tract 5/16   . Hemorrhagic shock (CMS HCC)    . Hepatic cirrhosis (CMS HCC) 83/15/1761    non alcoholic   . Hx of transfusion 04/09/2017    no reaction   . Hydrocele    . Hypertension 07/10/2014   .  Hypothyroid    . Marfan syndrome    . Neck problem 04/09/2017    LROM   . Non-healing non-surgical wound    . Obesity    . Palpitations    . Peripheral vascular disease (CMS [place]) 04/09/2017    no recent problems since starting lasix in 01/2017   . Syncope    . Thyroid disease     hypothyroid   . Transfusion history     s/p Multiple units PRC/FFP this adm.   . Type 2 diabetes mellitus (CMS [place]) 04/09/2017    diet controlled, HA1C 6.0   . Valvular disease 04/09/2017    [place] aortic valve replacement-chronic anti-coagulant therapy   . Wears glasses      Past Surgical History:   Procedure Laterality Date   . Colonoscopy     . Hx colonoscopy     . Hx heart catheterization     . Hx heart valve surgery  1999   . Hx hemicolectomy Right 07/13/2014   . Hx tonsil and adenoidectomy     . Hx tonsillectomy     . Hx upper endoscopy  07/09/14   . Hx wisdom teeth extraction       Current Outpatient Medications   Medication Sig   . acetaminophen (TYLENOL) 325 mg Oral Tablet Take 2 Tabs (650 mg total) by mouth Every 6 hours as needed   . cholecalciferol, vitamin D3, 1,000 unit Oral Tablet Take 1,000 Units by mouth Once a day   . docusate sodium (COLACE) 100 mg Oral Capsule Take 1 Cap (100 mg total) by mouth Twice daily   . enoxaparin (LOVENOX) 120 mg/0.8 mL Subcutaneous Syringe 0.8  mL (120 mg total) by Subcutaneous route Every 12 hours (Patient not taking: Reported on 06/09/2017)   . eplerenone (INSPRA) 25 mg Oral Tablet Take 1 Tab (25 mg total) by mouth Once a day   . furosemide (LASIX) 40 mg Oral Tablet 1 TWICE PER DAY FOR SWELLING (Patient taking differently: 40 mg Once a day 1 TWICE PER DAY FOR SWELLING)   . levothyroxine (SYNTHROID) 200 mcg Oral Tablet TAKE ONE TABLET BY MOUTH ONCE DAILY   . lisinopril (PRINIVIL) 5 mg Oral Tablet Take 1 Tab (5 mg total) by mouth Once a day   . MAGNESIUM CARBONATE ORAL Take by mouth Once a day   . methocarbamol (ROBAXIN) 500 mg Oral Tablet Take 1 Tab (500 mg total) by mouth Four times a day as needed   . Metoprolol Succinate (TOPROL-XL) 200 mg Oral Tablet Sustained Release 24 hr Take 1 Tab (200 mg total) by mouth Once a day   . oxyCODONE (ROXICODONE) 5 mg Oral Tablet Take 1 Tab (5 mg total) by mouth Every 4 hours as needed (Patient not taking: Reported on 06/25/2017)   . vitamin B complex Oral Tablet Take 1 Tab by mouth Once a day   . warfarin (COUMADIN) 5 mg Oral Tablet Two tabs on Mon and Fri with 1.5 tabs all other days or as directed To be adjusted     Family Medical History:     Problem Relation (Age of Onset)    Cirrhosis Brother    Hypertension (High Blood Pressure) Mother    Lung Cancer Father            Social History     Socioeconomic History   . Marital status: Married     Spouse name: Not on file   . Number of children: Not on file   .  Years of education: Not on file   . Highest education level: Not on file   Tobacco Use   . Smoking status: Never Smoker   . Smokeless tobacco: Never Used   Substance and Sexual Activity   . Alcohol use: No     Alcohol/week: 0.0 oz   . Drug use: No   Other Topics Concern   . Ability to Walk 1 Flight of Steps without SOB/CP Yes   . Ability to Walk 2 Flight of Steps without SOB/CP Yes   . Ability To Do Own ADL's Yes   . Other Activity Level Yes     Comment: walks a lot a work, up and down steps several times a day          Review of Systems: A comprehensive review of systems was negative.   Exceptions noted above      List of Current Health Care Providers   Care Team     PCP     Name Type Specialty Phone Number    Viona Gilmore, MD Physician [facility] Internal Medicine [phone]          Care Team     Name Type Specialty Phone Number    Viona Gilmore, MD Physician INTERNAL MEDICINE [phone]    Jill Alexanders, RN Registered Nurse MEDICAL ONCOLOGY Not available                  Health Maintenance   Topic Date Due   . Adult Tdap-Td (1 - Tdap) 05/13/1967   . Shingles Vaccine (1 of 2) 05/13/1998   . Pneumococcal 65+ Years Low Risk (1 of 2 - PCV13) 05/12/2013   . Influenza Vaccine (1) 10/10/2017   . Depression Screening  03/08/2018   . Annual Wellness Exam  03/08/2018   . Colonoscopy  01/27/2026   . Hepatitis C screening antibody  Completed     Medicare Wellness Assessment      Advance Directives (optional)                  Activities of Daily Living                   Urinary Incontinence Screen (Women >=65 only)       Cognitive Function Screen (1=Yes, 0=No)                                               Hearing Screen       Fall Risk Screen       Vision Screen           Depression Screen            OBJECTIVE:   There were no vitals taken for this visit.     Physical Exam     General -- alert     Repeat BP taken by me --    HEENT -- PERRL, EOMI   Heart --mechanical click  Neck -- Thyroid is normal sized  Lungs -- clear; no crackles in bases  Abdomen --  wound vacuum in place and on   Neuro -- normal gait and tone; strength is 5/5  Skin -- some edema in BL legs   Psych -- pleasant and smiling; talking about the spring        Labs -- reviewed by me in the  EMR  May 2019    WBC 4.8, Hgb 11.9 (MCV 89); platelets 222    Creatinine 0.98   Glucose 141    LFT's okay      Wound clinic -- released from their care    Dr. Tish Men note -- a fib: continue coumadin/Toprol; cardiomyopathy: increase Toprol; lisinopril, Eplerenone      March 2019    WBC 10.5   Hgb 8.3   Platelets 238    Creatinine 0.93    Glucose 147     January 2019    Dr. Tish Men -- Cardiomyopathy/diastolic CHF     November 2018    Creatinine 1.22   LFT's okay     May 2018                 WBC 5.9                Hgb 15.7                Platelets 127                 Creatinine 1.02                A1c 5.7%                 CEA 1.17 January 2016                 Colonic biopsy -- hyperplastic polyp only     September 2017                 Creatinine 1.21                 Hgb 15.18 August 2015                 Hgb 15.7                Creatinine 1.14                CT scan of chest -- minimal lung nodules, not changed; not big     March 2017                 INR 2.17 January 2015                 Dr. Burke Keels note: no evidence of any metastatic disease                 Dr. Dianah Field note: pT2N0 stage I colon cancer                 Hgb 11.8                 Creatinine 1.04                 Glucose 99                LFT's okay                CEA 1.5                CT chest/abdomen/pelvis -- no evidence of recurrent disease; some small lung nodules     June 2016                [place] Note:  Follow up colonoscopy in December 2016                               CT scan of chest/abdomen/pelvis in summer 2017     May 2016                Creatinine 1.27                Glucose 136 (not fasting)                 LFT's okay     March 2016                 Creatinine 1.02                TSH 1.23                LFT's okay     Health Maintenance Due   Topic Date Due   . Adult Tdap-Td (1 - Tdap) 05/13/1967   . Shingles Vaccine (1 of 2) 05/13/1998   . 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 Lasix was 40 mg BID and he is now back to 40 mg daily in the AM. He appears euvolemic at this  time.     Diabetes -- doing fantastic     Ventral hernia repair  -- DC'd from the wound clinic.     S/P laporoscopic right hemicolectomy with stapled anastomosis for stage II colon cancer -- had a clean colonoscopy in December 2016 and December 2017. He will likely not need another scope until 2020     Hypothyroidism -- on replacement    Obesity -- he is generally doing well overall     Vision -- glasses; no changes     Hearing issues -- not limited     Exercise --  He is walking on a regular basis    Need for HIV or hepatitis C screening -- no reason to screen     Tobacco abuse screening -- none at all     Alcohol and drug abuse screening -- none at all     Depression/anxiety screening -- he denies any at this time     Screening for colorectal cancer -- he is not due for screening     Immunizations -- I recommend a yearly flu shot       DISPOSITION:  Continue current medications   Check INR today     Follow up with me in 6 months   Flu shot this fall   I refilled his medications today         No orders of the defined types were placed in this encounter.         The patient has been educated about risk factors and recommended preventive care. Written Prevention Plan completed/ updated and given to patient (see After Visit Summary).    No follow-ups on file.    Viona Gilmore, MD  [facility] INTERNAL MEDICINE  INTERNAL MEDICINE, [facility]  [address]  [place] [address]  [phone]

## 2017-09-06 NOTE — Nursing Note (Signed)
BP 124/72   Pulse 82   Temp 36.8 C (98.2 F)   Ht 1.905 m (6\' 3" )   Wt 125.2 kg (276 lb)   SpO2 98%   BMI 34.50 kg/m     Kenneth Weeks, [place]  09/06/2017, 09:59

## 2017-09-07 ENCOUNTER — Ambulatory Visit (HOSPITAL_BASED_OUTPATIENT_CLINIC_OR_DEPARTMENT_OTHER): Payer: Self-pay | Admitting: INTERNAL MEDICINE

## 2017-09-07 NOTE — Progress Notes (Signed)
09/07/17 1100   [facility] PT/INR   Date INR drawn 09/07/17   INR 2.8   New warfarin dosage INR is at goal   New weekly dosgae Keep the same   Comments Check INR in one month   Initials Kaelan Emami   Goal INR 2.5-3.5

## 2017-09-07 NOTE — Progress Notes (Signed)
Pt notified of results and verbalized understanding. Pt had no questions or concerns. Made one month INR  apt.  Wallie Renshaw, MA  09/07/2017, 11:50

## 2017-09-15 ENCOUNTER — Telehealth (INDEPENDENT_AMBULATORY_CARE_PROVIDER_SITE_OTHER): Payer: Self-pay | Admitting: INTERNAL MEDICINE

## 2017-09-15 MED ORDER — SILDENAFIL 100 MG TABLET
100.00 mg | ORAL_TABLET | ORAL | 5 refills | Status: DC | PRN
Start: 2017-09-15 — End: 2018-01-17

## 2017-09-15 NOTE — Telephone Encounter (Signed)
Patient requesting a script for Viagra 100 mg, #30 to be sent to [facility]

## 2017-09-17 ENCOUNTER — Ambulatory Visit
Admission: RE | Admit: 2017-09-17 | Discharge: 2017-09-17 | Disposition: A | Discharge status: Home / Self Care | Payer: Medicare Other | Source: Ambulatory Visit | Attending: Student in an Organized Health Care Education/Training Program | Admitting: Student in an Organized Health Care Education/Training Program

## 2017-09-17 DIAGNOSIS — K5730 Diverticulosis of large intestine without perforation or abscess without bleeding: Secondary | ICD-10-CM | POA: Insufficient documentation

## 2017-09-17 DIAGNOSIS — K8020 Calculus of gallbladder without cholecystitis without obstruction: Secondary | ICD-10-CM | POA: Insufficient documentation

## 2017-09-17 DIAGNOSIS — Z9049 Acquired absence of other specified parts of digestive tract: Secondary | ICD-10-CM | POA: Insufficient documentation

## 2017-09-17 DIAGNOSIS — C189 Malignant neoplasm of colon, unspecified: Secondary | ICD-10-CM | POA: Insufficient documentation

## 2017-09-17 LAB — POC ISTAT CREATININE (RESULT): CREATININE, POC: 0.9 mg/dL (ref 0.62–1.27)

## 2017-09-17 MED ORDER — DIATRIZOATE MEGLUMINE-DIATRIZOATE SODIUM 66 %-10 % ORAL SOLUTION
8.00 mL | ORAL | Status: AC
Start: 2017-09-17 — End: 2017-09-17
  Administered 2017-09-17: 8 mL via ORAL

## 2017-09-17 MED ORDER — IOVERSOL 320 MG IODINE/ML INTRAVENOUS SOLUTION
100.00 mL | INTRAVENOUS | Status: AC
Start: 2017-09-17 — End: 2017-09-17
  Administered 2017-09-17: 100 mL via INTRAVENOUS

## 2017-09-17 MED ADMIN — sodium chloride 0.9 % intravenous solution: ORAL | @ 09:00:00 | NDC 00338004904

## 2017-09-29 ENCOUNTER — Other Ambulatory Visit (HOSPITAL_BASED_OUTPATIENT_CLINIC_OR_DEPARTMENT_OTHER): Payer: Self-pay | Admitting: Student in an Organized Health Care Education/Training Program

## 2017-09-29 DIAGNOSIS — C189 Malignant neoplasm of colon, unspecified: Secondary | ICD-10-CM

## 2017-10-01 ENCOUNTER — Ambulatory Visit
Admission: RE | Admit: 2017-10-01 | Discharge: 2017-10-01 | Disposition: A | Discharge status: Home / Self Care | Payer: Medicare Other | Source: Ambulatory Visit | Attending: Student in an Organized Health Care Education/Training Program | Admitting: Student in an Organized Health Care Education/Training Program

## 2017-10-01 ENCOUNTER — Encounter (HOSPITAL_BASED_OUTPATIENT_CLINIC_OR_DEPARTMENT_OTHER): Payer: Self-pay | Admitting: Student in an Organized Health Care Education/Training Program

## 2017-10-01 ENCOUNTER — Ambulatory Visit (HOSPITAL_BASED_OUTPATIENT_CLINIC_OR_DEPARTMENT_OTHER): Payer: Medicare Other | Admitting: Student in an Organized Health Care Education/Training Program

## 2017-10-01 ENCOUNTER — Other Ambulatory Visit (HOSPITAL_BASED_OUTPATIENT_CLINIC_OR_DEPARTMENT_OTHER): Payer: Self-pay | Admitting: Student in an Organized Health Care Education/Training Program

## 2017-10-01 VITALS — BP 138/66 | HR 81 | Temp 98.4°F | Resp 18 | Ht 75.5 in | Wt 283.3 lb

## 2017-10-01 DIAGNOSIS — Z952 Presence of prosthetic heart valve: Secondary | ICD-10-CM | POA: Insufficient documentation

## 2017-10-01 DIAGNOSIS — E039 Hypothyroidism, unspecified: Secondary | ICD-10-CM | POA: Insufficient documentation

## 2017-10-01 DIAGNOSIS — I509 Heart failure, unspecified: Secondary | ICD-10-CM | POA: Insufficient documentation

## 2017-10-01 DIAGNOSIS — Z7901 Long term (current) use of anticoagulants: Secondary | ICD-10-CM | POA: Insufficient documentation

## 2017-10-01 DIAGNOSIS — C189 Malignant neoplasm of colon, unspecified: Secondary | ICD-10-CM

## 2017-10-01 DIAGNOSIS — Q8740 Marfan's syndrome, unspecified: Secondary | ICD-10-CM | POA: Insufficient documentation

## 2017-10-01 DIAGNOSIS — Z08 Encounter for follow-up examination after completed treatment for malignant neoplasm: Secondary | ICD-10-CM | POA: Insufficient documentation

## 2017-10-01 DIAGNOSIS — Z9049 Acquired absence of other specified parts of digestive tract: Secondary | ICD-10-CM | POA: Insufficient documentation

## 2017-10-01 DIAGNOSIS — Z8719 Personal history of other diseases of the digestive system: Secondary | ICD-10-CM | POA: Insufficient documentation

## 2017-10-01 DIAGNOSIS — Z79899 Other long term (current) drug therapy: Secondary | ICD-10-CM | POA: Insufficient documentation

## 2017-10-01 DIAGNOSIS — Z85038 Personal history of other malignant neoplasm of large intestine: Secondary | ICD-10-CM | POA: Insufficient documentation

## 2017-10-01 DIAGNOSIS — I482 Chronic atrial fibrillation: Secondary | ICD-10-CM | POA: Insufficient documentation

## 2017-10-01 DIAGNOSIS — E1151 Type 2 diabetes mellitus with diabetic peripheral angiopathy without gangrene: Secondary | ICD-10-CM | POA: Insufficient documentation

## 2017-10-01 DIAGNOSIS — I110 Hypertensive heart disease with heart failure: Secondary | ICD-10-CM | POA: Insufficient documentation

## 2017-10-01 DIAGNOSIS — K760 Fatty (change of) liver, not elsewhere classified: Secondary | ICD-10-CM | POA: Insufficient documentation

## 2017-10-01 DIAGNOSIS — Z79890 Hormone replacement therapy (postmenopausal): Secondary | ICD-10-CM | POA: Insufficient documentation

## 2017-10-01 LAB — COMPREHENSIVE METABOLIC PANEL, NON-FASTING
ALBUMIN: 3.7 g/dL (ref 3.4–4.8)
ALKALINE PHOSPHATASE: 66 U/L (ref 45–115)
ALT (SGPT): 19 U/L (ref <55)
ANION GAP: 6 mmol/L (ref 4–13)
AST (SGOT): 20 U/L (ref 8–48)
BILIRUBIN TOTAL: 1.4 mg/dL — ABNORMAL HIGH (ref 0.3–1.3)
BUN/CREA RATIO: 13 (ref 6–22)
BUN: 15 mg/dL (ref 8–25)
CALCIUM: 9 mg/dL (ref 8.5–10.2)
CHLORIDE: 104 mmol/L (ref 96–111)
CO2 TOTAL: 25 mmol/L (ref 22–32)
CREATININE: 1.16 mg/dL (ref 0.62–1.27)
ESTIMATED GFR: >59 mL/min/1.73mˆ2 (ref >59)
GLUCOSE: 160 mg/dL — ABNORMAL HIGH (ref 65–139)
POTASSIUM: 5 mmol/L (ref 3.5–5.1)
POTASSIUM: 5 mmol/L (ref 3.5–5.1)
PROTEIN TOTAL: 7.3 g/dL (ref 6.0–8.0)
SODIUM: 135 mmol/L — ABNORMAL LOW (ref 136–145)

## 2017-10-01 LAB — CBC WITH DIFF
BASOPHIL #: <0.1 x10ˆ3/uL (ref <=0.20)
BASOPHIL %: 1 %
EOSINOPHIL #: <0.1 x10ˆ3/uL (ref <=0.50)
EOSINOPHIL %: 1 %
EOSINOPHIL %: 1 %
HCT: 43.3 % (ref 38.9–52.0)
HGB: 14.1 g/dL (ref 13.4–17.5)
IMMATURE GRANULOCYTE #: <0.1 x10ˆ3/uL (ref <0.10)
IMMATURE GRANULOCYTE %: 0 % (ref 0–1)
LYMPHOCYTE #: 0.91 x10ˆ3/uL — ABNORMAL LOW (ref 1.00–4.80)
LYMPHOCYTE %: 22 %
MCH: 31.1 pg (ref 26.0–32.0)
MCHC: 32.6 g/dL (ref 31.0–35.5)
MCV: 95.6 fL (ref 78.0–100.0)
MONOCYTE #: 0.25 x10ˆ3/uL (ref 0.20–1.10)
MONOCYTE %: 6 %
NEUTROPHIL #: 2.97 x10ˆ3/uL (ref 1.50–7.70)
NEUTROPHIL %: 70 %
PLATELETS: 119 x10ˆ3/uL — ABNORMAL LOW (ref 150–400)
RBC: 4.53 x10ˆ6/uL (ref 4.50–6.10)
RDW-CV: 14.7 % (ref 11.5–15.5)
WBC: 4.2 x10ˆ3/uL (ref 3.7–11.0)

## 2017-10-01 LAB — PLATELETS AND ANC CANCER CENTER
NEUTROPHILS #: 2.97 x10ˆ3/uL (ref 1.50–7.70)
PLATELET COUNT: 125 x10ˆ3/uL — ABNORMAL LOW (ref 150–400)

## 2017-10-01 LAB — CARCINOEMBRYONIC ANTIGEN: CEA: 2.3 ng/mL (ref <=5.0)

## 2017-10-01 NOTE — Cancer Center Note (Signed)
[facility]              Department of Hematology/Oncology             Cancer Center Note      Date of Service:  10/01/17  Name:  Kenneth, Weeks  MRN:  [id number]  Date of Birth:  May 24, 1950  PCP:  Viona Gilmore, MD      CHIEF COMPLAINT:  Colon Cancer      HPI:  Kenneth Weeks is a 67 y.o. male from [place] [address] who presents for further evaluation and management of colon cancer.  He is accompanied by his wife.  He was last seen in clinic 03/22/17.  During interim follow-up appointment, patient had two hospital admissions.  The first (2/11/9-2/13/19) for an incarcerated hernia causing a partial small bowel obstruction.  The hernia was reduced and patient was discharged home with an abdominal binder and plan to follow-up with general surgery.  The patient was admitted the second time on 04/21/17 with plan for hernia repair with mesh.  Two days after the surgery, patient returned to the OR for evacuation of a large abdominal wall hematoma with explantation of mesh and ligation of bleeding vessel.  His open abdominal wound was transitioned to a wound vac and he was discharged from the hospital on 04/30/17.  Wound vac was removed on 06/23/17.  Patient reports feeling well today and without complaints.  His abdominal wound has healed.  He is having regular bowel movements.  Denies any abdominal pain, nausea, hematochezia, or melena.         ONCOLOGY HISTORY:   -April 2016: Large right hydrocele that was drained. He gained 60 lbs of water weight post-operatively and was put on lasix. There was concern for infection and he was started on antibiotocs as well.   -07/09/14: He presented with acute onset of rectal bleeding with bloody diarrhea and generalized weakness. He has been on anticoagulation for chronic atrial fibrillation and St Jude metal valve replacement in 1999.  His INR shot up to 10.6 at that time from antibiotics & coumadin interaction.  -07/10/14: Colonoscopy was  performed. It showed a large mass on 1st fold right colon covering 1/3 of the wall circumference. Clean, superficial ulcer overlying mass. No active bleeding on initial inspection. Multiple biopsies obtained with cold biopsy forceps. 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  -06/15/14: He underwent laproscopic right hemicolectomy. Pathology revealed moderately differentiated adenocarcinoma, resection margins free, 0/30 nodes, LVI negative, perineural invasion negative. pT2N0, Stage I.  -01/23/15: Colonoscopy revealed a pedunculated polyp measuring 10 mm in size in the sigmoid colon. A polypectomy was performed using snare cautery. The resection was complete, the polyp tissue was completely retrieved. Pathology consistent with tubulovillous adenoma.      HISTORY:   Current Outpatient Medications   Medication Sig   . cholecalciferol, vitamin D3, 1,000 unit Oral Tablet Take 1,000 Units by mouth Once a day   . docusate sodium (COLACE) 100 mg Oral Capsule Take 1 Cap (100 mg total) by mouth Twice daily   . eplerenone (INSPRA) 25 mg Oral Tablet Take 1 Tab (25 mg total) by mouth Once a day (Patient not taking: Reported on 10/01/2017)   . furosemide (LASIX) 40 mg Oral Tablet Take 1 Tab (40 mg total) by mouth Twice daily 1 TWICE PER DAY FOR SWELLING   . levothyroxine (SYNTHROID) 200 mcg Oral Tablet TAKE ONE TABLET BY MOUTH ONCE DAILY   . lisinopril (  PRINIVIL) 5 mg Oral Tablet Take 1 Tab (5 mg total) by mouth Once a day   . MAGNESIUM CARBONATE ORAL Take by mouth Once a day   . methocarbamol (ROBAXIN) 500 mg Oral Tablet Take 1 Tab (500 mg total) by mouth Four times a day as needed (Patient not taking: Reported on 10/01/2017)   . Metoprolol Succinate (TOPROL-XL) 200 mg Oral Tablet Sustained Release 24 hr Take 1 Tab (200 mg total) by mouth Once a day   . Sildenafil (VIAGRA) 100 mg Oral Tablet Take 1 Tab (100 mg total) by mouth Every 24 hours as needed   . tamsulosin (FLOMAX) 0.4 mg Oral Capsule Take 0.4 mg by  mouth Every evening after dinner   . vitamin B complex Oral Tablet Take 1 Tab by mouth Once a day   . warfarin (COUMADIN) 5 mg Oral Tablet Two tabs on Mon and Fri with 1.5 tabs all other days or as directed To be adjusted     Oral Chemotherapy Assessment  Currently prescribed any oral chemotherapy medication(s)?: No        Allergies   Allergen Reactions   . Allegra [Fexofenadine]  Other Adverse Reaction (Add comment)     "retain fluid"   . Antihistamines - Alkylamine      Pt states antihistamines cause him to retain fluid    . Antihistamines - Ethanolamine          Past Medical History:   Diagnosis Date   . A-fib (CMS HCC)     Chronic anti-coagulant therapy; Hx cardioversions   . Abdominal hernia     Ventral hernia; Left Inguinal hernia   . Acute on chronic renal failure (CMS HCC)    . Biliary and gallbladder disorder     "Gallstones"   . Blood thinned due to long-term anticoagulant use    . Bruises easily    . Cancer (CMS HCC) 04/09/2017    Colon grade 0 surgery   . Congestive heart failure (CMS HCC) 04/09/2017    CHF 01/2017    . Diabetes mellitus (CMS [place])     type ii diet controlled   . Disorder of liver 04/09/2017    fatty liver   . Dysrhythmias     a fib   . Ejection fraction < 50%    . Heart murmur    . Hemorrhage of gastrointestinal tract 5/16   . Hemorrhagic shock (CMS HCC)    . Hepatic cirrhosis (CMS HCC) 37/11/6267    non alcoholic   . Hx of transfusion 04/09/2017    no reaction   . Hydrocele    . Hypertension 07/10/2014   . Hypothyroid    . Marfan syndrome    . Neck problem 04/09/2017    LROM   . Non-healing non-surgical wound    . Obesity    . Palpitations    . Peripheral vascular disease (CMS [place]) 04/09/2017    no recent problems since starting lasix in 01/2017   . Syncope    . Thyroid disease     hypothyroid   . Transfusion history     s/p Multiple units PRC/FFP this adm.   . Type 2 diabetes mellitus (CMS [place]) 04/09/2017    diet controlled, HA1C 6.0   . Valvular disease 04/09/2017    St Judes aortic  valve replacement-chronic anti-coagulant therapy   . Wears glasses          Past Surgical History:   Procedure Laterality Date   . COLONOSCOPY     .  HX COLONOSCOPY     . HX HEART CATHETERIZATION     . [place]    st judes    . HX HEMICOLECTOMY Right 07/13/2014    Laparoscopic right hemicolectomy for colon cancer   . HX TONSIL AND ADENOIDECTOMY     . HX TONSILLECTOMY     . HX UPPER ENDOSCOPY  07/09/14   . HX WISDOM TEETH EXTRACTION           Social History     Tobacco Use   . Smoking status: Never Smoker   . Smokeless tobacco: Never Used   Substance Use Topics   . Alcohol use: No     Alcohol/week: 0.0 standard drinks   . Drug use: No     Social History     Occupational History   . Not on file         Family Medical History:     Problem Relation (Age of Onset)    Cirrhosis Brother    Hypertension (High Blood Pressure) Mother    Lung Cancer Father          REVIEW OF SYSTEMS:  Constitutional: (-) fatigue. (-) fevers. (-) chills. (-) weight loss. (-) nausea.  Eyes: (-) vision changes. (-) tearing. (-) dry eyes.  ENMT: (-) hearing loss. (-) tinnitus. (-) rhinorrhea. (-) epistaxis. (-) mouth sores. (-) sore throat.  Cardiovascular: (-) chest discomfort. (-) palpitations. (-) peripheral edema.   Respiratory: (-) dyspnea [on exertion]. (-) cough. (-) hemoptysis. (-) wheezing.  Gastrointestinal: (-) abdominal pain. (-) indigestion. (-) constipation. (-) diarrhea. (-) melena/hematochezia.  Genitourinary: (-) dysuria. (-) hematuria. (-) incontinence.  Musculoskeletal: (-) musculoskeletal pain. (-) restricted range of motion.  Neurological: (-) headaches. (-) weakness. (-) neuropathy. (-) memory problems.  Integumentary: (-) pruritis. (-) rash. (-) concerning lesions.  Psychiatric: (-) mood changes. (-) depression. (-) anxiety. (-) insomnia.  Hematological/Lymphatics: (-) abnormal bruising/bleeding. (-) palpable masses.  Endocrine: (-) heat/cold intolerance. (-) excessive thirst. (-) frequent  urination.  Immunological/Allergy: (+) medication/environmental allergy. (-) recurrent infections.  Other review of systems negative.      PHYSICAL EXAM:  Most Recent Vitals      Office Visit from 10/01/2017 in Hematology/Oncology Clinic, [facility]   Temperature  36.9 C (98.4 F) filed at... 10/01/2017 1331   Heart Rate  81 filed at... 10/01/2017 1331   Respiratory Rate  18 filed at... 10/01/2017 1331   BP (Non-Invasive)  138/66 filed at... 10/01/2017 1331   SpO2  -   Height  1.918 m (6' 3.5") filed at... 10/01/2017 1331   Weight  128.5 kg (283 lb 4.7 oz) filed at... 10/01/2017 1331   BMI (Calculated)  35.01 filed at... 10/01/2017 1331   BSA (Calculated)  2.62 filed at... 10/01/2017 1331         Performance Status: (0) Fully active, able to carry on all predisease performance without restriction   Constitutional: appears stated age; no acute distress; vital signs reviewed  Eyes: pupils equally round and reactive; conjunctivae clear: sclerae non-icteric  ENMT: oropharynx without erythema or injection; tongue symmetrical; uvula midline; mouth mucous membranes moist; no oral lesions  Cardiovascular: irregularly, irregular rhythm; murmur noted; heart valve click auscultated; no JVD; no peripheral edema  Respiratory: breath sounds clear to auscultation throughout; no wheezes or rales; breathing is even and non-labored; symmetrical expansion of chest wall  Gastrointestinal: abdomen soft; non-tender; bowel sounds normal; no hepatosplenomegaly appreciated  Genitourinary: deferred  Musculoskeletal: full range of motion in upper and  lower extremities; normal strength throughout  Integumentary: skin is warm and dry; no visible rashes  Neurological: non-focal; cranial nerves II-XII intact  Psychiatric: alert; oriented; speech is normal; behavior appropriate; normal thought process  Hematological/Lymphatics: no palpable cervical, supraclavicular, or axillary lymphadenopathy; no visible petechiae; resolving ecchymotic areas noted on  right forearm      LABS:  -I have reviewed the most recent lab results    Hospital Outpatient Visit on 10/01/2017   Component Date Value Ref Range Status   . CEA 10/01/2017 2.3  <=5.0 ng/mL Final      CEA Methodology = Chemiluminescent immunoassay performed on [employer] Architect     Serial CEA measurements can be used to monitor patients for progression, regression or recurrence of cancer following treatment.Elevated levels may be present in non-malignant disease and in heavy smokers. Not recommended for screening asymptomatic patients for cancer. Smoking increases CEA; results are typically <10 ng/mL.   . SODIUM 10/01/2017 135* 136 - 145 mmol/L Final   . POTASSIUM 10/01/2017 5.0  3.5 - 5.1 mmol/L Final   . CHLORIDE 10/01/2017 104  96 - 111 mmol/L Final   . CO2 TOTAL 10/01/2017 25  22 - 32 mmol/L Final   . ANION GAP 10/01/2017 6  4 - 13 mmol/L Final   . BUN 10/01/2017 15  8 - 25 mg/dL Final   . CREATININE 10/01/2017 1.16  0.62 - 1.27 mg/dL Final   . BUN/CREA RATIO 10/01/2017 13  6 - 22 Final   . ESTIMATED GFR 10/01/2017 >59  >59 mL/min/1.58m^2 Final   . ALBUMIN 10/01/2017 3.7  3.4 - 4.8 g/dL Final   . CALCIUM 10/01/2017 9.0  8.5 - 10.2 mg/dL Final   . GLUCOSE 10/01/2017 160* 65 - 139 mg/dL Final   . ALKALINE PHOSPHATASE 10/01/2017 66  45 - 115 U/L Final   . ALT (SGPT) 10/01/2017 19  <55 U/L Final   . AST (SGOT) 10/01/2017 20  8 - 48 U/L Final   . BILIRUBIN TOTAL 10/01/2017 1.4* 0.3 - 1.3 mg/dL Final   . PROTEIN TOTAL 10/01/2017 7.3  6.0 - 8.0 g/dL Final   . WBC 10/01/2017 4.2  3.7 - 11.0 x10^3/uL Final   . RBC 10/01/2017 4.53  4.50 - 6.10 x10^6/uL Final   . HGB 10/01/2017 14.1  13.4 - 17.5 g/dL Final   . HCT 10/01/2017 43.3  38.9 - 52.0 % Final   . MCV 10/01/2017 95.6  78.0 - 100.0 fL Final   . MCH 10/01/2017 31.1  26.0 - 32.0 pg Final   . MCHC 10/01/2017 32.6  31.0 - 35.5 g/dL Final   . RDW-CV 10/01/2017 14.7  11.5 - 15.5 % Final   . PLATELETS 10/01/2017 119* 150 - 400 x10^3/uL Final   . NEUTROPHIL % 10/01/2017 70  %  Final   . LYMPHOCYTE % 10/01/2017 22  % Final   . MONOCYTE % 10/01/2017 6  % Final   . EOSINOPHIL % 10/01/2017 1  % Final   . BASOPHIL % 10/01/2017 1  % Final   . NEUTROPHIL # 10/01/2017 2.97  1.50 - 7.70 x10^3/uL Final   . LYMPHOCYTE # 10/01/2017 0.91* 1.00 - 4.80 x10^3/uL Final   . MONOCYTE # 10/01/2017 0.25  0.20 - 1.10 x10^3/uL Final   . EOSINOPHIL # 10/01/2017 <0.10  <=0.50 x10^3/uL Final   . BASOPHIL # 10/01/2017 <0.10  <=0.20 x10^3/uL Final   . IMMATURE GRANULOCYTE % 10/01/2017 0  0 - 1 % Final    The immature  granulocyte fraction (IGF) quantifies total circulating myelocytes, metamyelocytes, and promyelocytes. It is used to evaluate immune responses to infection, inflammation, or other stimuli of the bone marrow. Caution is advised in interpreting test results in neonates who normally have greater numbers of circulating immature blood cells.     . IMMATURE GRANULOCYTE # 10/01/2017 <0.10  <0.10 x10^3/uL Final   . NEUTROPHILS # 10/01/2017 2.97  1.50 - 7.70 x10^3/uL Final   . PLATELET COUNT 10/01/2017 125* 150 - 400 x10^3/uL Final       IMAGING:  -I have reviewed all pertinent imaging and pathology reports      ASSESSMENT:   Oryn Casanova is a 67 year old male with PMH of [place], mechanical aortic valve in 1999 (on anticoagulation), afib s/p cardioversion, DM, hypothyroidism who presented to [facility] with bloody diarrhea, supratherapeutic INR and hypotension.Colonoscopy remarkable for large mass along the first fold of the ascending colon concerning for malignancy. He underwent laproscopic right hemicolectomy on 06/15/14. Pathology revealed a 3.5 cm well differentiated to moderately differentiated adenocarcinoma, resection margins free, 0/30 nodes involved, LVI negative, perineural invasion not present. pT2N0, Stage 1.  Patient is on active surveillance with periodic history and physical examination, CEA testing, CT imaging and endoscopic surveillance.  Patient is doing well and without acute complaints.    1.  Colon cancer    PLAN:    1. Continue active surveillance.  Patient is doing well and without active complaints.  No signs or symptoms of recurrence.  2. Labs obtained and reviewed.  Unremarkable.  Will repeat with next visit.  3. Most recent CT CAP with no evidence of recurrence.  Plan to repeat in one year.  4. RTC in six months for evaluation, sooner if needed.      The patient was seen as a shared visit with the co-signing faculty.    Amy Maust, APRN,FNP-BC      I saw and examined the patient. I reviewed the APP's note. I agree with the findings and plan of care as documented in the APP's note. Any exceptions/additions are edited/noted.    In interim since last visit, he has had a hernia repair. On exam, abdomen is soft, non tender, incisions are healed.  Labs today normal except thrombocytopenia which is new. It is mild, plt ct 125. Will continue to monitor. I reviewed CT CAP 09/17/2017 images and there is no evidence of metastatic disease in chest, abdomen or pelvis.    RTC in 6 months.    Ardeth Sportsman, MD  Assistant Professor  [address]

## 2017-10-03 ENCOUNTER — Encounter (HOSPITAL_BASED_OUTPATIENT_CLINIC_OR_DEPARTMENT_OTHER): Payer: Self-pay | Admitting: Student in an Organized Health Care Education/Training Program

## 2017-10-07 ENCOUNTER — Ambulatory Visit: Payer: Medicare Other | Attending: INTERNAL MEDICINE

## 2017-10-07 ENCOUNTER — Ambulatory Visit (INDEPENDENT_AMBULATORY_CARE_PROVIDER_SITE_OTHER): Payer: Self-pay | Admitting: INTERNAL MEDICINE

## 2017-10-07 DIAGNOSIS — I482 Chronic atrial fibrillation, unspecified: Secondary | ICD-10-CM

## 2017-10-07 LAB — PT/INR
INR: 2.14
PROTHROMBIN TIME: 25.4 s — ABNORMAL HIGH (ref 9.4–12.5)

## 2017-10-07 NOTE — Progress Notes (Signed)
Left VM for Pt to call the office.  Wallie Renshaw, MA  10/07/2017, 15:53

## 2017-10-07 NOTE — Progress Notes (Signed)
10/07/17 1500   [facility] PT/INR   Date INR drawn 10/07/17   INR 2.1   New warfarin dosage INR is just a touch low for a mechanical valve   New weekly dosgae Keep the same and check in 2 weeks -- just to make sure it is not getting any lower   Follow up/ Next visit Celisa Schoenberg   Goal INR 2.5-3.5

## 2017-10-12 NOTE — Progress Notes (Signed)
Pt notified of results and verbalized understanding. Pt had no questions or concerns.  Wallie Renshaw, MA  10/12/2017, 14:56

## 2017-10-12 NOTE — Progress Notes (Signed)
Left VM for Pt to call the office.  Wallie Renshaw, MA  10/12/2017, 11:58

## 2017-10-20 ENCOUNTER — Encounter (HOSPITAL_BASED_OUTPATIENT_CLINIC_OR_DEPARTMENT_OTHER): Payer: Self-pay | Admitting: Student in an Organized Health Care Education/Training Program

## 2017-10-26 ENCOUNTER — Ambulatory Visit: Payer: Medicare Other | Attending: INTERNAL MEDICINE

## 2017-10-26 DIAGNOSIS — Z7901 Long term (current) use of anticoagulants: Secondary | ICD-10-CM | POA: Insufficient documentation

## 2017-10-26 LAB — PT/INR
INR: 2.37
PROTHROMBIN TIME: 27.2 s — ABNORMAL HIGH (ref 9.4–12.5)
PROTHROMBIN TIME: 27.2 seconds — ABNORMAL HIGH (ref 9.4–12.5)

## 2017-10-27 ENCOUNTER — Ambulatory Visit (INDEPENDENT_AMBULATORY_CARE_PROVIDER_SITE_OTHER): Payer: Self-pay | Admitting: INTERNAL MEDICINE

## 2017-10-27 NOTE — Progress Notes (Signed)
10/27/17 1000   [facility] PT/INR   Date INR drawn 10/27/17   INR 2.4   New warfarin dosage INR is at goal   New weekly dosgae Keep the same   Comments Check INR in one month   Initials RMcCarthy   Anticoag Indication VALVE   Goal INR 2.5-3.5

## 2017-11-23 ENCOUNTER — Other Ambulatory Visit (INDEPENDENT_AMBULATORY_CARE_PROVIDER_SITE_OTHER): Payer: Self-pay | Admitting: Cardiovascular Disease

## 2017-11-23 DIAGNOSIS — Z952 Presence of prosthetic heart valve: Secondary | ICD-10-CM

## 2017-11-23 DIAGNOSIS — I480 Paroxysmal atrial fibrillation: Secondary | ICD-10-CM

## 2017-11-23 DIAGNOSIS — I350 Nonrheumatic aortic (valve) stenosis: Secondary | ICD-10-CM

## 2017-11-25 ENCOUNTER — Ambulatory Visit: Payer: Medicare Other | Attending: INTERNAL MEDICINE

## 2017-11-25 DIAGNOSIS — I482 Chronic atrial fibrillation, unspecified: Secondary | ICD-10-CM | POA: Insufficient documentation

## 2017-11-25 DIAGNOSIS — Z7901 Long term (current) use of anticoagulants: Secondary | ICD-10-CM | POA: Insufficient documentation

## 2017-11-25 LAB — PT/INR
INR: 2.57
PROTHROMBIN TIME: 29.6 s — ABNORMAL HIGH (ref 9.4–12.5)

## 2017-11-26 ENCOUNTER — Ambulatory Visit (INDEPENDENT_AMBULATORY_CARE_PROVIDER_SITE_OTHER): Payer: Self-pay | Admitting: INTERNAL MEDICINE

## 2017-11-26 NOTE — Progress Notes (Signed)
Pt notified of results and verbalized understanding. Pt had no questions or concerns.  Wallie Renshaw, MA  11/26/2017, 16:21

## 2017-11-26 NOTE — Progress Notes (Signed)
11/26/17 1600   [facility] PT/INR   Date INR drawn 11/26/17   INR 2.6   New warfarin dosage INR is at goal   New weekly dosgae Keep the same   Comments Check INR In one month   Initials Leron Stoffers   Goal INR 2.5-3.5

## 2017-12-17 ENCOUNTER — Ambulatory Visit (HOSPITAL_BASED_OUTPATIENT_CLINIC_OR_DEPARTMENT_OTHER)
Admission: RE | Admit: 2017-12-17 | Discharge: 2017-12-17 | Disposition: A | Discharge status: Home / Self Care | Payer: Medicare Other | Source: Ambulatory Visit | Attending: Cardiovascular Disease | Admitting: Cardiovascular Disease

## 2017-12-17 DIAGNOSIS — Z951 Presence of aortocoronary bypass graft: Secondary | ICD-10-CM | POA: Insufficient documentation

## 2017-12-17 DIAGNOSIS — I429 Cardiomyopathy, unspecified: Secondary | ICD-10-CM | POA: Insufficient documentation

## 2017-12-17 DIAGNOSIS — Z952 Presence of prosthetic heart valve: Secondary | ICD-10-CM | POA: Insufficient documentation

## 2017-12-17 DIAGNOSIS — Z85038 Personal history of other malignant neoplasm of large intestine: Secondary | ICD-10-CM | POA: Insufficient documentation

## 2017-12-17 DIAGNOSIS — I340 Nonrheumatic mitral (valve) insufficiency: Secondary | ICD-10-CM | POA: Insufficient documentation

## 2017-12-17 DIAGNOSIS — I480 Paroxysmal atrial fibrillation: Secondary | ICD-10-CM | POA: Insufficient documentation

## 2017-12-17 DIAGNOSIS — I361 Nonrheumatic tricuspid (valve) insufficiency: Secondary | ICD-10-CM

## 2017-12-17 DIAGNOSIS — E119 Type 2 diabetes mellitus without complications: Secondary | ICD-10-CM | POA: Insufficient documentation

## 2017-12-17 DIAGNOSIS — I350 Nonrheumatic aortic (valve) stenosis: Secondary | ICD-10-CM | POA: Insufficient documentation

## 2017-12-17 DIAGNOSIS — I517 Cardiomegaly: Secondary | ICD-10-CM

## 2017-12-17 DIAGNOSIS — I10 Essential (primary) hypertension: Secondary | ICD-10-CM | POA: Insufficient documentation

## 2017-12-17 MED ORDER — PERFLUTREN LIPID MICROSPHERES 1.1 MG/ML INTRAVENOUS SUSPENSION
2.00 mL | INTRAVENOUS | Status: AC
Start: 2017-12-17 — End: 2017-12-17
  Administered 2017-12-17: 2 mL via INTRAVENOUS

## 2017-12-17 NOTE — Ancillary Notes (Signed)
Echo with definity completed   Kenneth Bamberg RN from cath lab started IV  Definity given by myself

## 2017-12-27 ENCOUNTER — Ambulatory Visit: Payer: Medicare Other | Attending: INTERNAL MEDICINE

## 2017-12-27 ENCOUNTER — Ambulatory Visit (INDEPENDENT_AMBULATORY_CARE_PROVIDER_SITE_OTHER): Payer: Self-pay | Admitting: INTERNAL MEDICINE

## 2017-12-27 DIAGNOSIS — I482 Chronic atrial fibrillation, unspecified: Secondary | ICD-10-CM | POA: Insufficient documentation

## 2017-12-27 LAB — PT/INR
INR: 2.32
PROTHROMBIN TIME: 26.7 s — ABNORMAL HIGH (ref 9.4–12.5)

## 2017-12-27 NOTE — Progress Notes (Signed)
[facility]  [address]  [place] [address]  [phone]    Date: 12/30/2017  Patient Name: Kenneth Weeks  MRN#: [id number]  DOB: 1950/09/08    Provider: Barnet Glasgow, MD  PCP: Viona Gilmore, MD      Reason for visit: Atrial Fibrillation    History:     Kenneth Weeks is a 67 y.o. male with a history of Afib, AS s/p AVR, Cardiomyopathy/CHF, and Pulmonary HTN. He was last seen on 06/09/17 when Toprol XL was increased to 200mg  daily for better rate control and an echo was ordered to reassess AS.     Today he reports feeling well from a cardiac standpoint. He is frustrated that he has gained weight. He works 12 hour shifts and believes he drinks too much caffeine as he checks his BP at home and it has been elevated recently. No c/o any CP, SOB, palpitations, syncope/presyncope, orthopnea/PND, or claudication. He has stable mild LE edema.  Minimal DOE.    Past Medical History:     Past Medical History:   Diagnosis Date   . A-fib (CMS HCC)     Chronic anti-coagulant therapy; Hx cardioversions   . Abdominal hernia     Ventral hernia; Left Inguinal hernia   . Acute on chronic renal failure (CMS HCC)    . Biliary and gallbladder disorder     "Gallstones"   . Blood thinned due to long-term anticoagulant use    . Bruises easily    . Cancer (CMS HCC) 04/09/2017    Colon grade 0 surgery   . Congestive heart failure (CMS HCC) 04/09/2017    CHF 01/2017    . Diabetes mellitus (CMS [facility] [place])     type ii diet controlled   . Disorder of liver 04/09/2017    fatty liver   . Dysrhythmias     a fib   . Ejection fraction < 50%    . Heart murmur    . Hemorrhage of gastrointestinal tract 5/16   . Hemorrhagic shock (CMS HCC)    . Hepatic cirrhosis (CMS HCC) 88/41/6606    non alcoholic   . Hx of transfusion 04/09/2017    no reaction   . Hydrocele    . Hypertension 07/10/2014   . Hypothyroid    . Marfan syndrome    . Neck problem 04/09/2017    LROM   . Non-healing non-surgical wound    .  Obesity    . Palpitations    . Peripheral vascular disease (CMS [facility]) 04/09/2017    no recent problems since starting lasix in 01/2017   . Syncope    . Thyroid disease     hypothyroid   . Transfusion history     s/p Multiple units PRC/FFP this adm.   . Type 2 diabetes mellitus (CMS [place]) 04/09/2017    diet controlled, HA1C 6.0   . Valvular disease 04/09/2017    [employer] Judes aortic valve replacement-chronic anti-coagulant therapy   . Wears glasses          Past Surgical History:     Past Surgical History:   Procedure Laterality Date   . Colonoscopy     . Hx colonoscopy     . Hx heart catheterization     . Hx heart valve surgery  1999   . Hx hemicolectomy Right 07/13/2014   . Hx tonsil and adenoidectomy     . Hx tonsillectomy     .  Hx upper endoscopy  07/09/14   . Hx wisdom teeth extraction       Allergies:     Allergies   Allergen Reactions   . Allegra [Fexofenadine]  Other Adverse Reaction (Add comment)     "retain fluid"   . Antihistamines - Alkylamine      Pt states antihistamines cause him to retain fluid    . Antihistamines - Ethanolamine        Medications:     Current Outpatient Medications   Medication Sig   . cholecalciferol, vitamin D3, 1,000 unit Oral Tablet Take 1,000 Units by mouth Once a day   . docusate sodium (COLACE) 100 mg Oral Capsule Take 1 Cap (100 mg total) by mouth Twice daily   . eplerenone (INSPRA) 25 mg Oral Tablet Take 1 Tab (25 mg total) by mouth Once a day   . furosemide (LASIX) 40 mg Oral Tablet Take 0.5 Tabs (20 mg total) by mouth Once a day 1 TWICE PER DAY FOR SWELLING (Patient taking differently: Take 20 mg by mouth Once a day )   . levothyroxine (SYNTHROID) 200 mcg Oral Tablet TAKE ONE TABLET BY MOUTH ONCE DAILY   . MAGNESIUM CARBONATE ORAL Take by mouth Once a day   . methocarbamol (ROBAXIN) 500 mg Oral Tablet Take 1 Tab (500 mg total) by mouth Four times a day as needed (Patient not taking: Reported on 10/01/2017)   . Metoprolol Succinate (TOPROL-XL) 200 mg Oral Tablet Sustained Release 24  hr Take 1 Tab (200 mg total) by mouth Once a day   . sacubitril-valsartan (ENTRESTO) 24-26 mg Oral Tablet Take 1 Tab by mouth Twice daily   . Sildenafil (VIAGRA) 100 mg Oral Tablet Take 1 Tab (100 mg total) by mouth Every 24 hours as needed   . tamsulosin (FLOMAX) 0.4 mg Oral Capsule Take 0.4 mg by mouth Every evening after dinner   . vitamin B complex Oral Tablet Take 1 Tab by mouth Once a day   . warfarin (COUMADIN) 5 mg Oral Tablet Two tabs on Mon and Fri with 1.5 tabs all other days or as directed To be adjusted       Family History:     Family Medical History:     Problem Relation (Age of Onset)    Cirrhosis Brother    Hypertension (High Blood Pressure) Mother    Lung Cancer Father              Social History:     Social History     Socioeconomic History   . Marital status: Married     Spouse name: Not on file   . Number of children: Not on file   . Years of education: Not on file   . Highest education level: Not on file   Tobacco Use   . Smoking status: Never Smoker   . Smokeless tobacco: Never Used   Substance and Sexual Activity   . Alcohol use: No     Alcohol/week: 0.0 standard drinks   . Drug use: No   Other Topics Concern   . Ability to Walk 1 Flight of Steps without SOB/CP Yes   . Ability to Walk 2 Flight of Steps without SOB/CP Yes   . Ability To Do Own ADL's Yes   . Other Activity Level Yes     Comment: walks a lot a work, up and down steps several times a day       Review of Systems:  All other systems were reviewed and are negative other than noted in the HPI.    Physical Exam:     Vitals:    12/30/17 1103   BP: (!) 160/76   Pulse: 79   Resp: 15   SpO2: 98%   Weight: 134 kg (295 lb 6.4 oz)   Body mass index is 36.44 kg/m.    General:No acute distress  HEENT: Moist mucous membranes  Neck: Supple, no JVD, no bruits  Heart/Cardiovascular:  Irregular  No murmur heard today, crisp valve click  2+ radial pulses  Respiratory/Lungs: Non-labored, clear to auscultation  Abdominal/Gastrointestinal: Soft,  non-tender  Extremities: Warm and well-perfused, chronic 1+ LE edema with skin thickening  Skin: No rash  Neurological: Alert and oriented x3, grossly nonfocal  Psych: Normal affect    Cardiovascular Workup:     Hx of DCCV    AVR 1999 ([facility])  1. [employer] Judes mechanical prosthetic AV.    Echo 07/11/14  1. Left ventricle: The LV systolic function is normal. LV ejection fraction is 55% - 60%. LV diastolic function is unable to be assessed. The study was not technically sufficient to allow evaluation of LV diastolic dysfunction due to atrial fibrillation. There was no evidence of increased ventricular filling pressure by Doppler parameters.  2. Tricuspid valve: Mild-moderate tricuspid valve regurgitation.  3. Left atrium: The left atrial size is moderately dilated.    Echo 12/14/16  1. Mildly dilated left ventricle. Mildly depressed left ventricular systolic function. LV Ejection Fraction is 45 %. Normal geometry. Left ventricular diastolic function could not be assessed due to the presence of atrial fibrillation during the study.  2. Resting Segmental Wall Motion Analysis: Total wall motion score is 2.00. There is global left ventricular hypokinesis.  3. Moderately dilated right ventricle. Mildly depressed right ventricular systolic function. RV systolic pressure is consistent with at least mild-to-moderate pulmonary hypertension (TR peak gradient 41 mmHg). RA pressure could not be assessed as the IVC is not well visualized.  4. Severely dilated left atrium.  5. Moderately dilated right atrium.  6. There is mild primary mitral valve regurgitation.  7. A mechanical prosthetic valve is present in the aortic position. The aortic valve prosthesis appears well seated. The aortic prosthesis demonstrates a moderately increased transvalvular gradient for valve type and size. This is suggestive of moderate prosthetic aortic stenosis.    Nuclear Stress Test 01/20/17  1.The patient had decreased perfusion both at rest  and stress in the inferior lateral wall and this was considered a mild area of moderate intensity, consistent with diaphragmatic attenuation artifact. There was no evidence of ischemia noted.   2.The patient had septal akinesis and mild global hypokinesis noted on gated wall motion analysis with an ejection fraction moderately decreased at 36%.  3.The patient was noted to have a dilated left ventricle, both at rest and stress.    Echo 12/17/17  1. Normal left ventricular size. LV Ejection Fraction is 35-40 %. Concentric hypertrophy. Left ventricular diastolic function could not be assessed due to the presence of atrial fibrillation during the study.  2. There is mild primary mitral valve regurgitation.  3. No aortic regurgitation seen. A bileaflet tilting disk mechanical prosthetic valve is present in the aortic position.    EKG 12/30/17  Afib.    Assessment and Plan:     Kenneth Weeks is a 67 y.o. male with    AS:s/p [facility]es AVR in 1999. Mechanical prosthetic AV suggestive of moderate prosthetic AS  on 12/14/16 Echo. Echo 12/17/17 bileaflet tilting disk mechanical prosthetic valve is present in the aortic position.  -on Coumadin with goal INR 2.5-3.5  - not on aspirin given h/o GI bleed    Cardiomyopathy/Diastolic CHF:EF 71-16% on 07/11/14 Echo and now EF 45%, mildly dilated LV, and global LV hypokinesis on 12/14/16 Echo.Nuc. Stress test 01/20/17- No evidence of ischemia noted, septal akinesis and mild global hypokinesis with an EF of 36%, and a dilated LV, bot at rest and stress. Echo 12/17/17 EF 35-40%.   - NYHA class II CHF  - intolerant to Spironolactone(gynecomastia)  - continue on Toprol-XL  - advised to restart Eplerenone 25mg  daily (unclear why stopped)  - reduce lasix to 20mg  daily at this time given the addition of Entresto  - stop Kdur (does not reported but he states he is taking)  - stop Lisinopril and start Entresto 24-26mg  BID tomorrow am (last Lisinopril dose yesterday pm),  empirically reduce lasix to 20 mg daily (was on 40 mg daily)  - BMP to be done prior to follow up    A-Fib:Hx of DCCV. Echo 12/14/16- Presence of A-Fib, severely dilated LA, and moderately dilated RA.  - Afib on EKG today  - stable SOB/DOE   - CHA2DS2-VASc score of 3 (age + HTN + CMP/CHF)  - after 5+ years of A Fib it is not likely that we will be able to successfully rhythm control him  - continue Coumadin for stroke prophylaxis  - continue Toprol for rate control    HTN:BP elevated today  - continue meds with changes above    Lipids:FLP 07/10/14- Total 74, Trig. 115, HDL 18, LDL 33  - not on statin therapy  - encouraged heart healthy diet and exercise    Nonsmoker    Hx ofMarfan's Syndrome(?):  - unclear how diagnosed; no h/o ectopia lentis, aortic root normal in size on recent echo      Follow up in 6 weeks      Thank you for allowing me to participate in the care of your patient. Please feel free to contact me if there are further questions.     I am scribing for, and in the presence of Dr. Barnet Glasgow, MD for services provided on 12/30/2017.  Paula Libra, SCRIBE    [place], [place]  12/30/2017, 11:28    I personally performed the services described in this documentation, as scribed in my presence, and it is both accurate and complete.    Barnet Glasgow, MD    Barnet Glasgow, MD  12/30/2017, 11:36

## 2017-12-27 NOTE — Progress Notes (Signed)
12/27/17 1500   [facility]/INR   Date INR drawn 12/27/17   INR 2.3   New warfarin dosage INR is fine   New weekly dosgae Keep the same   Comments Check in one month   Initials [facility]   Anticoag Indication valve   Goal INR 2.5-3.5

## 2017-12-27 NOTE — Progress Notes (Signed)
Pt notified of results and verbalized understanding. Pt had no questions or concerns.  Wallie Renshaw, MA  12/27/2017, 16:00

## 2017-12-30 ENCOUNTER — Encounter (INDEPENDENT_AMBULATORY_CARE_PROVIDER_SITE_OTHER): Payer: Self-pay | Admitting: Cardiovascular Disease

## 2017-12-30 ENCOUNTER — Ambulatory Visit (INDEPENDENT_AMBULATORY_CARE_PROVIDER_SITE_OTHER): Payer: Medicare Other | Admitting: Cardiovascular Disease

## 2017-12-30 VITALS — BP 160/76 | HR 79 | Resp 15 | Wt 295.4 lb

## 2017-12-30 DIAGNOSIS — I4891 Unspecified atrial fibrillation: Secondary | ICD-10-CM

## 2017-12-30 DIAGNOSIS — I509 Heart failure, unspecified: Secondary | ICD-10-CM

## 2017-12-30 DIAGNOSIS — I480 Paroxysmal atrial fibrillation: Secondary | ICD-10-CM

## 2017-12-30 DIAGNOSIS — I5030 Unspecified diastolic (congestive) heart failure: Secondary | ICD-10-CM

## 2017-12-30 DIAGNOSIS — I350 Nonrheumatic aortic (valve) stenosis: Principal | ICD-10-CM

## 2017-12-30 DIAGNOSIS — I272 Pulmonary hypertension, unspecified: Secondary | ICD-10-CM

## 2017-12-30 DIAGNOSIS — I10 Essential (primary) hypertension: Secondary | ICD-10-CM

## 2017-12-30 DIAGNOSIS — I429 Cardiomyopathy, unspecified: Secondary | ICD-10-CM

## 2017-12-30 MED ORDER — FUROSEMIDE 40 MG TABLET
20.0000 mg | ORAL_TABLET | Freq: Every day | ORAL | 3 refills | Status: DC
Start: 2017-12-30 — End: 2019-02-01

## 2017-12-30 MED ORDER — SACUBITRIL 24 MG-VALSARTAN 26 MG TABLET
1.00 | ORAL_TABLET | Freq: Two times a day (BID) | ORAL | 1 refills | Status: DC
Start: 2017-12-30 — End: 2018-03-08

## 2017-12-30 NOTE — Patient Instructions (Addendum)
Start Entresto 24-26mg  twice a day tomorrow morning. Restart Eplerenone 25mg  daily. Stop taking potassium. Reduce Lasix to 20mg  daily. Have your labs done in 6 weeks before your follow up appointment.

## 2018-01-11 ENCOUNTER — Telehealth (INDEPENDENT_AMBULATORY_CARE_PROVIDER_SITE_OTHER): Payer: Self-pay | Admitting: Cardiovascular Disease

## 2018-01-11 NOTE — Nursing Note (Signed)
Returned the patient's call and advised him to continue taking Entresto 24-26mg  tablets twice daily.  The patient was instructed to go ahead and have his BMP drawn at this time. The patient is scheduled to see his PCP tomorrow.  The patient verbalized understanding and is agreeable to the plan of care.   Sheron Nightingale, RN  01/11/2018, 14:05    RE: Possible reaction to [place]   Received: Yesterday   Message Contents   Plitt, Monia Sabal, MD  Sheron Nightingale, RN            Get BMP and continue for now    Previous Messages      ----- Message -----   From: Sheron Nightingale, RN   Sent: 01/10/2018 10:17 AM EST   To: Barnet Glasgow, MD   Subject: Possible reaction to [facility]             The patient called the office this am stating that he has been experiencing lower back pain and cramping since the day after starting Entresto 24-26mg  tablets. He started this new medication on 12/31/2017. The patient stated he knows this is an odd reaction, but he wanted to report the change since he read that Delene Loll can affect his kidney function. The patient was advised to follow up with his PCP to rule out other causes for his symptoms. The patient has an order for BMP that you placed at his last visit. He plans to complete this in a few weeks. What do you advise for the patient?     Thank you :)

## 2018-01-17 ENCOUNTER — Ambulatory Visit (INDEPENDENT_AMBULATORY_CARE_PROVIDER_SITE_OTHER): Payer: Medicare Other | Admitting: INTERNAL MEDICINE

## 2018-01-17 ENCOUNTER — Ambulatory Visit: Payer: Medicare Other | Attending: INTERNAL MEDICINE

## 2018-01-17 ENCOUNTER — Ambulatory Visit (INDEPENDENT_AMBULATORY_CARE_PROVIDER_SITE_OTHER): Payer: Self-pay | Admitting: INTERNAL MEDICINE

## 2018-01-17 ENCOUNTER — Encounter (INDEPENDENT_AMBULATORY_CARE_PROVIDER_SITE_OTHER): Payer: Self-pay | Admitting: INTERNAL MEDICINE

## 2018-01-17 VITALS — BP 120/80 | HR 86 | Temp 97.7°F | Ht 75.0 in | Wt 294.8 lb

## 2018-01-17 DIAGNOSIS — E119 Type 2 diabetes mellitus without complications: Secondary | ICD-10-CM

## 2018-01-17 DIAGNOSIS — E669 Obesity, unspecified: Secondary | ICD-10-CM

## 2018-01-17 DIAGNOSIS — Z7901 Long term (current) use of anticoagulants: Principal | ICD-10-CM

## 2018-01-17 DIAGNOSIS — E039 Hypothyroidism, unspecified: Secondary | ICD-10-CM

## 2018-01-17 DIAGNOSIS — I482 Chronic atrial fibrillation, unspecified: Secondary | ICD-10-CM

## 2018-01-17 DIAGNOSIS — I509 Heart failure, unspecified: Secondary | ICD-10-CM | POA: Insufficient documentation

## 2018-01-17 LAB — COMPREHENSIVE METABOLIC PNL, FASTING
ALBUMIN/GLOBULIN RATIO: 1.3 (ref 0.8–2.0)
ALBUMIN: 4 g/dL (ref 3.5–5.0)
ALKALINE PHOSPHATASE: 58 U/L (ref 38–126)
ALKALINE PHOSPHATASE: 58 U/L (ref 38–126)
ALT (SGPT): 24 U/L (ref 17–63)
ALT (SGPT): 24 U/L (ref 17–63)
ANION GAP: 5 mmol/L (ref 3–11)
AST (SGOT): 23 U/L (ref 15–41)
BILIRUBIN TOTAL: 1.4 mg/dL — ABNORMAL HIGH (ref 0.3–1.2)
BUN/CREA RATIO: 18 (ref 6–22)
BUN: 21 mg/dL — ABNORMAL HIGH (ref 6–20)
CALCIUM: 9.1 mg/dL (ref 8.8–10.2)
CHLORIDE: 101 mmol/L (ref 101–111)
CO2 TOTAL: 29 mmol/L (ref 22–32)
CREATININE: 1.18 mg/dL (ref 0.61–1.24)
ESTIMATED GFR: >60 mL/min/{1.73_m2} (ref >60)
GLUCOSE: 129 mg/dL — ABNORMAL HIGH (ref 70–110)
POTASSIUM: 4.4 mmol/L (ref 3.4–5.1)
PROTEIN TOTAL: 7.2 g/dL (ref 6.4–8.3)
SODIUM: 135 mmol/L — ABNORMAL LOW (ref 136–145)

## 2018-01-17 LAB — PT/INR
INR: 3
PROTHROMBIN TIME: 34.6 s — ABNORMAL HIGH (ref 9.4–12.5)

## 2018-01-17 NOTE — Nursing Note (Signed)
BP 120/80   Pulse 86   Temp 36.5 C (97.7 F) (Thermal Scan)   Ht 1.905 m (6\' 3" )   Wt 133.7 kg (294 lb 12.8 oz)   SpO2 98%   BMI 36.85 kg/m     Charlyne Petrin, LPN  35/06/9739, 63:84
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
## 2018-01-17 NOTE — Progress Notes (Signed)
01/17/18 1600   [facility] PT/INR   Date INR drawn 01/17/18   INR 3.0   New warfarin dosage INR is at goal   New weekly dosgae  Keep the same   Comments Check INR in one month   Initials [place]   Anticoag Indication Valve   Goal INR 2.5-3.5

## 2018-01-18 NOTE — Progress Notes (Signed)
Pt notified of results and verbalized understanding. Pt had no questions or concerns.  Wallie Renshaw, MA  01/18/2018, 08:34

## 2018-01-26 ENCOUNTER — Other Ambulatory Visit: Payer: Self-pay

## 2018-02-17 ENCOUNTER — Ambulatory Visit (INDEPENDENT_AMBULATORY_CARE_PROVIDER_SITE_OTHER): Payer: Self-pay | Admitting: INTERNAL MEDICINE

## 2018-02-17 ENCOUNTER — Ambulatory Visit: Payer: Medicare Other | Attending: INTERNAL MEDICINE

## 2018-02-17 DIAGNOSIS — I482 Chronic atrial fibrillation, unspecified: Secondary | ICD-10-CM | POA: Insufficient documentation

## 2018-02-17 LAB — PT/INR
INR: 3.65
INR: 3.65
PROTHROMBIN TIME: 42.1 s — ABNORMAL HIGH (ref 9.4–12.5)

## 2018-02-17 NOTE — Progress Notes (Signed)
Pt notified of results and verbalized understanding. Pt had no questions or concerns.  Wallie Renshaw, MA  02/17/2018, 14:01

## 2018-02-17 NOTE — Progress Notes (Signed)
02/17/18 1300   [facility] PT/INR   Date INR drawn 02/17/18   INR 3.6   New warfarin dosage INR is at goal   New weekly dosgae Keep the same   Comments Check INR in one month   Initials Genieve Ramaswamy   Anticoag Indication mechanical valve   Goal INR 2.5-3.5

## 2018-02-21 ENCOUNTER — Encounter (INDEPENDENT_AMBULATORY_CARE_PROVIDER_SITE_OTHER): Payer: Self-pay | Admitting: Cardiovascular Disease

## 2018-03-08 ENCOUNTER — Ambulatory Visit: Payer: Medicare Other | Attending: INTERNAL MEDICINE

## 2018-03-08 ENCOUNTER — Encounter (INDEPENDENT_AMBULATORY_CARE_PROVIDER_SITE_OTHER): Payer: Self-pay | Admitting: INTERNAL MEDICINE

## 2018-03-08 ENCOUNTER — Ambulatory Visit (INDEPENDENT_AMBULATORY_CARE_PROVIDER_SITE_OTHER): Payer: Medicare Other | Admitting: INTERNAL MEDICINE

## 2018-03-08 VITALS — BP 122/82 | HR 80 | Temp 97.8°F | Ht 75.0 in | Wt 298.0 lb

## 2018-03-08 DIAGNOSIS — E119 Type 2 diabetes mellitus without complications: Secondary | ICD-10-CM

## 2018-03-08 DIAGNOSIS — E669 Obesity, unspecified: Secondary | ICD-10-CM

## 2018-03-08 DIAGNOSIS — E039 Hypothyroidism, unspecified: Secondary | ICD-10-CM

## 2018-03-08 DIAGNOSIS — Z7901 Long term (current) use of anticoagulants: Secondary | ICD-10-CM

## 2018-03-08 DIAGNOSIS — I482 Chronic atrial fibrillation, unspecified: Secondary | ICD-10-CM | POA: Insufficient documentation

## 2018-03-08 DIAGNOSIS — Z0000 Encounter for general adult medical examination without abnormal findings: Secondary | ICD-10-CM

## 2018-03-08 LAB — PT/INR: PROTHROMBIN TIME: 44.7 s — ABNORMAL HIGH (ref 9.4–12.5)
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
## 2018-03-08 NOTE — Nursing Note (Signed)
03/08/18 0800   Medicare Wellness Assessment   Medicare initial or wellness physical in the last year? No   Advance Directives   Does patient have a living will or MPOA YES   Has patient provided [employer] with a copy? YES   Advance directive information given to the patient today? no   Activities of Daily Living   Do you need help with dressing, bathing, or walking? No   Do you need help with shopping, housekeeping, medications, or finances? No   Do you have rugs in hallways, broken steps, or poor lighting? No   Do you have grab bars in your bathroom, non-slip strips in your tub, and hand rails on your stairs? Yes   Cognitive Function Screen   What is you age? 1   What is the time to the nearest hour? 1   What is the year? 1   What is the name of this clinic? 1   Can the patient recognize two persons (the doctor, the nurse, home help, etc.)? 1   What is the date of your birth? (day and month sufficient)  1   In what year did World War II end? 1   Who is the current president of the [place]? 1   Count from 20 down to 1? 1   What address did I give you earlier? 1   Total Score 10   Depression Screen   Little interest or pleasure in doing things. 0   Feeling down, depressed, or hopeless 0   PHQ 2 Total 0   Hearing Screen   Have you noticed any hearing difficulties? No   After whispering 9-1-6 how many numbers did the patient repeat correctly? 3   Fall Risk Assessment   Do you feel unsteady when standing or walking? No   Do you worry about falling? No   Have you fallen in the past year? No   Vision Screen   Right Eye = 20 20  (corrected)

## 2018-03-08 NOTE — Addendum Note (Signed)
Addended by: Shane Crutch on: 03/08/2018 09:06 AM     Modules accepted: Orders

## 2018-03-08 NOTE — Nursing Note (Signed)
BP 122/82   Pulse 80   Temp 36.6 C (97.8 F)   Ht 1.905 m (6\' 3" )   Wt 135.2 kg (298 lb)   SpO2 98%   BMI 37.25 kg/m     Kenneth Weeks, [place]  03/08/2018, 08:49

## 2018-03-08 NOTE — Patient Instructions (Signed)
Medicare Preventive Services  Medicare coverage information Recommendation for YOU   Heart Disease and Diabetes   Lipid profile every 5 years or more often if at risk for cardiovascular disease     Diabetes Screening with Blood Glucose test or Glucose Tolerance Test  yearly for those at risk for diabetes, up to two tests per year for those with prediabetes  Last Glucose: 129     Diabetes Self-Management Training initial training ten hours per year, and follow-up training two hours per subsequent year. Optional for those with diabetes    Medical Nutrition Therapy three hours of one-on-one counseling in first year, two hours in subsequent years. Optional for those with diabetes, kidney disease   Intensive Behavioral Therapy for Obesity  Face-to-face counseling, first month every week, month 2-6 every other week, month 7-12 every month if continued progress is documented Optional for those with Body Mass Index 30 or higher  Your Body mass index is 37.25 kg/m.   Tobacco Cessation (Quitting) Counseling   Two attempts per year, max 4 sessions per attempt, up to 8 sessions per year Optional for those who use tobacco    Cancer Screening   Colorectal screening   for anyone age 46 to 82 or any age if high risk:  . Screening Colonoscopy every 10 years, more frequent if high risk  OR  . Flexible  Sigmoidoscopy  every 5 years OR  . Fecal Occult Blood Testing yearly OR  . Cologuard Stool DNA test once every 3 years OR  . CT Colonography every 5 years  See Your Schedule below   Prostate Cancer Screening  Prostate Specific Antigen and Digital Rectal Exam NOT routinely recommended  Medicare will still cover annually after age 3 if recommended by your provider      Lung Cancer Screening  Annual low dose computed tomography (LDCT scan) is recommended for those age 27-77 who smoked 30 pack-years and are current smokers or quit smoking within past 15 years (one pack-year= smoking one PPD for one year), after counseling by your doctor  or nurse clinician about the possible benefits or harms. See Your Schedule below   Vaccinations   Pneumococcal Vaccine recommended routinely age 74+ with two separate vaccines one year apart (Prevnar then Pneumovax).  Recommended before age 17 if medical conditions increase risk  Seasonal Influenza Vaccine once every flu season   Hepatitis B Vaccine 3 doses if risk (including anyone with diabetes or liver disease)  Shingles Vaccine Once or twice age 12 or older  Diphtheria Tetanus Pertussis Vaccine ONCE as adult and a booster every 10 years      There is no immunization history on file for this patient.  Shingles vaccine and Diphtheria Tetanus Pertussis vaccines are available at pharmacies or local health department without a prescription.   Other Screening   Glaucoma Screening   yearly if in high risk group such as diabetes, family history, African American age 43+ or Hispanic American age 81+    Hepatitis C Screening recommended ONCE for those born between 1945-1965, or high risk for HCV infection  See Your Schedule below   HIV Testing recommended routinely at least ONCE, covered every year for age 75 to 53 regardless of risk, and every year for age over 76 who ask for the test or higher risk See Your Schedule below  Abdominal Aortic Aneurysm Screening Ultrasound Recommended ONCE for any male who smoked 100 cigarettes/lifetime OR with family history of aortic aneurysm     Your  Personalized Schedule for Preventive Tests   Health Maintenance: Pending and Last Completed       Date Due Completion Date    Adult Tdap-Td (1 - Tdap) 05/13/1967 ---    Shingles Vaccine (1 of 2) 05/13/1998 ---    Pneumococcal 65+ Years Low Risk (1 of 2 - PCV13) 05/12/2013 ---    Influenza Vaccine (1) 10/10/2017 ---    Annual Wellness Exam 03/08/2018 03/08/2017    Depression Screening 03/08/2018 03/08/2017    Colonoscopy 01/27/2026 01/28/2016

## 2018-03-09 ENCOUNTER — Ambulatory Visit (INDEPENDENT_AMBULATORY_CARE_PROVIDER_SITE_OTHER): Payer: Self-pay | Admitting: INTERNAL MEDICINE

## 2018-03-09 ENCOUNTER — Ambulatory Visit (HOSPITAL_BASED_OUTPATIENT_CLINIC_OR_DEPARTMENT_OTHER): Payer: Self-pay | Admitting: INTERNAL MEDICINE

## 2018-03-09 NOTE — Progress Notes (Signed)
03/09/18 1200   [facility] PT/INR   Date INR drawn 03/09/18   INR 3.8   New warfarin dosage INR is fine   New weekly dosgae Keep the same   Comments Check in one month   Initials Anabel Bene

## 2018-03-09 NOTE — Progress Notes (Signed)
Pt notified of INR results and verbalized understanding to keeping with his current dose of warfarin. Pt states he is currently taking 2 - 5 mg (10mg s) tabs on Mon and Fri, and 1 -1/2 tabs (7.5mg s) daily the rest of the week. Pt has F/U lab appt on 04/07/2017.

## 2018-03-21 ENCOUNTER — Other Ambulatory Visit: Payer: Self-pay

## 2018-04-01 ENCOUNTER — Other Ambulatory Visit: Payer: Self-pay

## 2018-04-01 ENCOUNTER — Ambulatory Visit
Admission: RE | Admit: 2018-04-01 | Discharge: 2018-04-01 | Disposition: A | Discharge status: Home / Self Care | Payer: Medicare Other | Source: Ambulatory Visit | Attending: Student in an Organized Health Care Education/Training Program | Admitting: Student in an Organized Health Care Education/Training Program

## 2018-04-01 ENCOUNTER — Encounter (HOSPITAL_BASED_OUTPATIENT_CLINIC_OR_DEPARTMENT_OTHER): Payer: Self-pay | Admitting: Student in an Organized Health Care Education/Training Program

## 2018-04-01 ENCOUNTER — Ambulatory Visit (HOSPITAL_BASED_OUTPATIENT_CLINIC_OR_DEPARTMENT_OTHER): Payer: Medicare Other | Admitting: Student in an Organized Health Care Education/Training Program

## 2018-04-01 VITALS — BP 149/82 | HR 91 | Temp 97.5°F | Resp 16 | Ht 75.0 in | Wt 299.6 lb

## 2018-04-01 DIAGNOSIS — Z79899 Other long term (current) drug therapy: Secondary | ICD-10-CM | POA: Insufficient documentation

## 2018-04-01 DIAGNOSIS — I4891 Unspecified atrial fibrillation: Secondary | ICD-10-CM | POA: Insufficient documentation

## 2018-04-01 DIAGNOSIS — Z79890 Hormone replacement therapy (postmenopausal): Secondary | ICD-10-CM | POA: Insufficient documentation

## 2018-04-01 DIAGNOSIS — E039 Hypothyroidism, unspecified: Secondary | ICD-10-CM | POA: Insufficient documentation

## 2018-04-01 DIAGNOSIS — I10 Essential (primary) hypertension: Secondary | ICD-10-CM | POA: Insufficient documentation

## 2018-04-01 DIAGNOSIS — C189 Malignant neoplasm of colon, unspecified: Secondary | ICD-10-CM | POA: Insufficient documentation

## 2018-04-01 DIAGNOSIS — E119 Type 2 diabetes mellitus without complications: Secondary | ICD-10-CM | POA: Insufficient documentation

## 2018-04-01 DIAGNOSIS — Z7901 Long term (current) use of anticoagulants: Secondary | ICD-10-CM | POA: Insufficient documentation

## 2018-04-01 LAB — CBC WITH DIFF
BASOPHIL #: <0.1 x10ˆ3/uL (ref <=0.20)
BASOPHIL %: 1 %
EOSINOPHIL #: <0.1 x10ˆ3/uL (ref <=0.50)
EOSINOPHIL %: 1 %
EOSINOPHIL %: 1 %
HCT: 43.7 % (ref 38.9–52.0)
HGB: 14.9 g/dL (ref 13.4–17.5)
IMMATURE GRANULOCYTE #: <0.1 10*3/uL (ref <0.10)
IMMATURE GRANULOCYTE %: 0 % (ref 0–1)
LYMPHOCYTE #: 1.04 x10ˆ3/uL (ref 1.00–4.80)
LYMPHOCYTE %: 17 %
MCH: 33 pg — ABNORMAL HIGH (ref 26.0–32.0)
MCH: 33 pg — ABNORMAL HIGH (ref 26.0–32.0)
MCHC: 34.1 g/dL (ref 31.0–35.5)
MCV: 96.9 fL (ref 78.0–100.0)
MONOCYTE #: 0.42 x10ˆ3/uL (ref 0.20–1.10)
MONOCYTE %: 7 %
MPV: 11.2 fL (ref 8.7–12.5)
NEUTROPHIL #: 4.68 x10ˆ3/uL (ref 1.50–7.70)
NEUTROPHIL %: 74 %
PLATELETS: 165 x10ˆ3/uL (ref 150–400)
RBC: 4.51 x10ˆ6/uL (ref 4.50–6.10)
RDW-CV: 13.1 % (ref 11.5–15.5)
WBC: 6.3 x10ˆ3/uL (ref 3.7–11.0)

## 2018-04-01 LAB — HEPATIC FUNCTION PANEL
ALBUMIN: 3.7 g/dL (ref 3.4–4.8)
ALKALINE PHOSPHATASE: 72 U/L (ref 45–115)
ALT (SGPT): 24 U/L (ref <55)
AST (SGOT): 28 U/L (ref 8–48)
BILIRUBIN DIRECT: 0.4 mg/dL — ABNORMAL HIGH (ref <0.3)
BILIRUBIN TOTAL: 1.1 mg/dL (ref 0.3–1.3)
PROTEIN TOTAL: 7.9 g/dL (ref 6.0–8.0)

## 2018-04-01 LAB — COMPREHENSIVE METABOLIC PANEL, NON-FASTING
ALBUMIN: 3.7 g/dL (ref 3.4–4.8)
ALKALINE PHOSPHATASE: 72 U/L (ref 45–115)
ALT (SGPT): 24 U/L (ref <55)
ALT (SGPT): 24 U/L (ref <55)
ANION GAP: 7 mmol/L (ref 4–13)
AST (SGOT): 28 U/L (ref 8–48)
BILIRUBIN TOTAL: 1.1 mg/dL (ref 0.3–1.3)
BUN/CREA RATIO: 16 (ref 6–22)
BUN: 16 mg/dL (ref 8–25)
CALCIUM: 10.1 mg/dL (ref 8.5–10.2)
CHLORIDE: 102 mmol/L (ref 96–111)
CO2 TOTAL: 29 mmol/L (ref 22–32)
CREATININE: 1.02 mg/dL (ref 0.62–1.27)
ESTIMATED GFR: >60 mL/min/{1.73_m2} (ref >60)
GLUCOSE: 117 mg/dL (ref 65–139)
POTASSIUM: 4.6 mmol/L (ref 3.5–5.1)
PROTEIN TOTAL: 7.9 g/dL (ref 6.0–8.0)
SODIUM: 138 mmol/L (ref 136–145)

## 2018-04-01 LAB — CARCINOEMBRYONIC ANTIGEN
CEA: 2.8 ng/mL (ref <=5.0)
CEA: 2.8 ng/mL (ref <=5.0)

## 2018-04-01 NOTE — Cancer Center Note (Signed)
[facility]              Department of Hematology/Oncology             Cancer Center Note      Date of Service:  04/01/18  Name:  Kenneth Weeks, Kenneth Weeks  MRN:  [id number]  Date of Birth:  26-Sep-1950  PCP:  Viona Gilmore, MD      CHIEF COMPLAINT:  Colon Cancer      HPI:  Kenneth Weeks is a 68 y.o. male from [place] [id number] who presents for further evaluation and management of colon cancer.  Patient is accompanied by his wife.  His oncological history has been reviewed and is summarized below.  Patient was last seen in clinic on 10/01/17.  During interim of appointments, patient has been doing fairly well.  He is without acute complaints.  More specifically, he denies any abdominal pain, nausea, changes in bowel habits, hematochezia or melena.      ONCOLOGY HISTORY:    -April 2016: Large right hydrocele that was drained. He gained 60 lbs of water weight post-operatively and was put on lasix. There was concern for infection and he was started on antibiotocs as well.   -07/09/14: He presented with acute onset of rectal bleeding with bloody diarrhea and generalized weakness. He has been on anticoagulation for chronic atrial fibrillation and St Jude metal valve replacement in 1999. His INR shot up to 10.6 at that time from antibiotics &coumadin interaction.  -07/10/14: Colonoscopy was performed. It showed a large mass on 1st fold right colon covering 1/3 of the wall circumference. Clean, superficial ulcer overlying mass. No active bleeding on initial inspection. Multiple biopsies obtained with cold biopsy forceps. 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  -06/15/14: He underwent laproscopic right hemicolectomy. Pathology revealed moderately differentiated adenocarcinoma, resection margins free, 0/30 nodes, LVI negative, perineural invasion negative. pT2N0, Stage I.  -01/23/15: Colonoscopy revealed a pedunculated polyp measuring 10 mm in size in the sigmoid  colon. A polypectomy was performed using snare cautery. The resection was complete, the polyp tissue was completely retrieved. Pathology consistent with tubulovillous adenoma.      HISTORY:   Current Outpatient Medications   Medication Sig   . cholecalciferol, vitamin D3, 1,000 unit Oral Tablet Take 1,000 Units by mouth Once a day   . docusate sodium (COLACE) 100 mg Oral Capsule Take 1 Cap (100 mg total) by mouth Twice daily   . furosemide (LASIX) 40 mg Oral Tablet Take 0.5 Tabs (20 mg total) by mouth Once a day 1 TWICE PER DAY FOR SWELLING (Patient taking differently: Take 20 mg by mouth Once a day )   . levothyroxine (SYNTHROID) 200 mcg Oral Tablet TAKE ONE TABLET BY MOUTH ONCE DAILY   . lisinopril (PRINIVIL) 5 mg Oral Tablet 5 mg   . MAGNESIUM CARBONATE ORAL Take by mouth Once a day   . Metoprolol Succinate (TOPROL-XL) 200 mg Oral Tablet Sustained Release 24 hr Take 1 Tab (200 mg total) by mouth Once a day   . tamsulosin (FLOMAX) 0.4 mg Oral Capsule Take 0.4 mg by mouth Every evening after dinner   . vitamin B complex Oral Tablet Take 1 Tab by mouth Once a day   . warfarin (COUMADIN) 5 mg Oral Tablet Two tabs on Mon and Fri with 1.5 tabs all other days or as directed To be adjusted     Oral Chemotherapy Assessment  Currently prescribed any oral chemotherapy medication(s)?: No  Allergies   Allergen Reactions   . Allegra [Fexofenadine]  Other Adverse Reaction (Add comment)     "retain fluid"   . Antihistamines - Alkylamine      Pt states antihistamines cause him to retain fluid    . Antihistamines - Ethanolamine          Past Medical History:   Diagnosis Date   . A-fib (CMS HCC)     Chronic anti-coagulant therapy; Hx cardioversions   . Abdominal hernia     Ventral hernia; Left Inguinal hernia   . Acute on chronic renal failure (CMS HCC)    . Biliary and gallbladder disorder     "Gallstones"   . Blood thinned due to long-term anticoagulant use    . Bruises easily    . Cancer (CMS HCC) 04/09/2017    Colon grade  0 surgery   . Congestive heart failure (CMS HCC) 04/09/2017    CHF 01/2017    . Diabetes mellitus (CMS [place])     type ii diet controlled   . Disorder of liver 04/09/2017    fatty liver   . Dysrhythmias     a fib   . Ejection fraction < 50%    . Heart murmur    . Hemorrhage of gastrointestinal tract 5/16   . Hemorrhagic shock (CMS HCC)    . Hepatic cirrhosis (CMS HCC) 33/35/4562    non alcoholic   . Hx of transfusion 04/09/2017    no reaction   . Hydrocele    . Hypertension 07/10/2014   . Hypothyroid    . Marfan syndrome    . Neck problem 04/09/2017    LROM   . Non-healing non-surgical wound    . Obesity    . Palpitations    . Peripheral vascular disease (CMS [place]) 04/09/2017    no recent problems since starting lasix in 01/2017   . Syncope    . Thyroid disease     hypothyroid   . Transfusion history     s/p Multiple units PRC/FFP this adm.   . Type 2 diabetes mellitus (CMS [place]) 04/09/2017    diet controlled, HA1C 6.0   . Valvular disease 04/09/2017    St Judes aortic valve replacement-chronic anti-coagulant therapy   . Wears glasses          Past Surgical History:   Procedure Laterality Date   . COLONOSCOPY     . HX COLONOSCOPY     . HX HEART CATHETERIZATION     . [place]    st judes    . HX HEMICOLECTOMY Right 07/13/2014    Laparoscopic right hemicolectomy for colon cancer   . HX TONSIL AND ADENOIDECTOMY     . HX TONSILLECTOMY     . HX UPPER ENDOSCOPY  07/09/14   . HX WISDOM TEETH EXTRACTION           Social History     Tobacco Use   . Smoking status: Never Smoker   . Smokeless tobacco: Never Used   Substance Use Topics   . Alcohol use: No     Alcohol/week: 0.0 standard drinks   . Drug use: No     Social History     Occupational History   . Not on file         Family Medical History:     Problem Relation (Age of Onset)    Cirrhosis Brother    Hypertension (High Blood Pressure) Mother  Lung Cancer Father            REVIEW OF SYSTEMS:  Constitutional: (-) fatigue. (-) fevers. (-) chills. (-) weight  loss. (-) nausea.  Eyes: (-) vision changes. (-) tearing. (-) dry eyes.  ENMT: (-) hearing loss. (-) tinnitus. (-) rhinorrhea. (-) epistaxis. (-) mouth sores. (-) sore throat.  Cardiovascular: (-) chest discomfort. (-) palpitations. (-) peripheral edema.   Respiratory: (-) dyspnea [on exertion]. (-) cough. (-) hemoptysis. (-) wheezing.  Gastrointestinal: (-) abdominal pain. (-) indigestion. (-) constipation. (-) diarrhea. (-) melena/hematochezia.  Genitourinary: (-) dysuria. (-) hematuria. (-) incontinence.  Musculoskeletal: (-) musculoskeletal pain. (-) restricted range of motion.  Neurological: (-) headaches. (-) weakness. (-) neuropathy. (-) memory problems.  Integumentary: (-) pruritis. (-) rash. (-) concerning lesions.  Breast: (-) skin changes. (-) nipple discharge. (-) masses. (-) mastalgia.  Psychiatric: (-) mood changes. (-) depression. (-) anxiety. (-) insomnia.  Hematological/Lymphatics: (-) abnormal bruising/bleeding. (-) palpable masses.  Endocrine: (-) heat/cold intolerance. (-) excessive thirst. (-) frequent urination.  Immunological/Allergy: (+) medication/environmental allergy. (-) recurrent infections.  Other review of systems negative.      PHYSICAL EXAM:  Most Recent Vitals      Office Visit from 04/01/2018 in Hematology/Oncology Clinic, [facility]   Temperature  36.4 C (97.5 F) filed at... 04/01/2018 1423   Heart Rate  91 filed at... 04/01/2018 1423   Respiratory Rate  16 filed at... 04/01/2018 1423   BP (Non-Invasive)  (!) 149/82 filed at... 04/01/2018 1423   SpO2  -   Height  1.905 m (6\' 3" ) filed at... 04/01/2018 1423   Weight  135.9 kg (299 lb 9.7 oz) filed at... 04/01/2018 1423   BMI (Calculated)  37.53 filed at... 04/01/2018 1423   BSA (Calculated)  2.68 filed at... 04/01/2018 1423        Performance Status: (0) Fully active, able to carry on all predisease performance without restriction   Constitutional: appears stated age; no acute distress; vital signs reviewed  Eyes: pupils equally round  and reactive; conjunctivae clear; sclerae non-icteric  ENMT: oropharynx without erythema or injection; tongue symmetrical; uvula midline; mouth mucous membranes moist; no oral lesions  Cardiovascular: irregularly, irregular rate and rhythm; murmur present; valve click auscultated; no JVD; no peripheral edema  Respiratory: breath sounds clear to auscultation throughout; no wheezes or rales; breathing is even and non-labored; symmetrical expansion of chest wall  Gastrointestinal: abdomen large, round and soft; non-tender; bowel sounds normal; no hepatosplenomegaly appreciated  Genitourinary: deferred  Musculoskeletal: full range of motion in upper and lower extremities; normal strength throughout  Integumentary: skin is warm and dry; no visible rashes  Neurological: non-focal; cranial nerves II-XII intact  Psychiatric: alert; oriented; speech is normal; behavior appropriate; normal thought process  Hematological/Lymphatics: no palpable cervical, supraclavicular, or axillary lymphadenopathy; no visible petechiae or ecchymotic areas       LABS:  -I have reviewed the most recent lab results    Hospital Outpatient Visit on 04/01/2018   Component Date Value Ref Range Status   . CEA 04/01/2018 2.8  <=5.0 ng/mL Final    CEA Methodology = Chemiluminescent immunoassay performed on Abbott Architect     Serial CEA measurements can be used to monitor patients for progression, regression or recurrence of cancer following treatment.Elevated levels may be present in non-malignant disease and in heavy smokers. Not recommended for screening asymptomatic patients for cancer. Smoking increases CEA; results are typically <10 ng/mL.   Marland Kitchen ALBUMIN 04/01/2018 3.7  3.4 - 4.8 g/dL Final   .  ALKALINE PHOSPHATASE 04/01/2018 72  45 - 115 U/L Final   . ALT (SGPT) 04/01/2018 24  <55 U/L Final   . AST (SGOT) 04/01/2018 28  8 - 48 U/L Final   . BILIRUBIN TOTAL 04/01/2018 1.1  0.3 - 1.3 mg/dL Final   . BILIRUBIN DIRECT 04/01/2018 0.4* <0.3 mg/dL Final     . PROTEIN TOTAL 04/01/2018 7.9  6.0 - 8.0 g/dL Final   . SODIUM 04/01/2018 138  136 - 145 mmol/L Final   . POTASSIUM 04/01/2018 4.6  3.5 - 5.1 mmol/L Final   . CHLORIDE 04/01/2018 102  96 - 111 mmol/L Final   . CO2 TOTAL 04/01/2018 29  22 - 32 mmol/L Final   . ANION GAP 04/01/2018 7  4 - 13 mmol/L Final   . BUN 04/01/2018 16  8 - 25 mg/dL Final   . CREATININE 04/01/2018 1.02  0.62 - 1.27 mg/dL Final   . BUN/CREA RATIO 04/01/2018 16  6 - 22 Final   . ESTIMATED GFR 04/01/2018 >60  >60 mL/min/1.63m^2 Final   . ALBUMIN 04/01/2018 3.7  3.4 - 4.8 g/dL Final   . CALCIUM 04/01/2018 10.1  8.5 - 10.2 mg/dL Final   . GLUCOSE 04/01/2018 117  65 - 139 mg/dL Final   . ALKALINE PHOSPHATASE 04/01/2018 72  45 - 115 U/L Final   . ALT (SGPT) 04/01/2018 24  <55 U/L Final   . AST (SGOT) 04/01/2018 28  8 - 48 U/L Final   . BILIRUBIN TOTAL 04/01/2018 1.1  0.3 - 1.3 mg/dL Final   . PROTEIN TOTAL 04/01/2018 7.9  6.0 - 8.0 g/dL Final   . WBC 04/01/2018 6.3  3.7 - 11.0 x10^3/uL Final   . RBC 04/01/2018 4.51  4.50 - 6.10 x10^6/uL Final   . HGB 04/01/2018 14.9  13.4 - 17.5 g/dL Final   . HCT 04/01/2018 43.7  38.9 - 52.0 % Final   . MCV 04/01/2018 96.9  78.0 - 100.0 fL Final   . MCH 04/01/2018 33.0* 26.0 - 32.0 pg Final   . MCHC 04/01/2018 34.1  31.0 - 35.5 g/dL Final   . RDW-CV 04/01/2018 13.1  11.5 - 15.5 % Final   . PLATELETS 04/01/2018 165  150 - 400 x10^3/uL Final   . MPV 04/01/2018 11.2  8.7 - 12.5 fL Final   . NEUTROPHIL % 04/01/2018 74  % Final   . LYMPHOCYTE % 04/01/2018 17  % Final   . MONOCYTE % 04/01/2018 7  % Final   . EOSINOPHIL % 04/01/2018 1  % Final   . BASOPHIL % 04/01/2018 1  % Final   . NEUTROPHIL # 04/01/2018 4.68  1.50 - 7.70 x10^3/uL Final   . LYMPHOCYTE # 04/01/2018 1.04  1.00 - 4.80 x10^3/uL Final   . MONOCYTE # 04/01/2018 0.42  0.20 - 1.10 x10^3/uL Final   . EOSINOPHIL # 04/01/2018 <0.10  <=0.50 x10^3/uL Final   . BASOPHIL # 04/01/2018 <0.10  <=0.20 x10^3/uL Final   . IMMATURE GRANULOCYTE % 04/01/2018 0  0 - 1 % Final     The immature granulocyte fraction (IGF) quantifies total circulating myelocytes, metamyelocytes, and promyelocytes. It is used to evaluate immune responses to infection, inflammation, or other stimuli of the bone marrow. Caution is advised in interpreting test results in neonates who normally have greater numbers of circulating immature blood cells.     . IMMATURE GRANULOCYTE # 04/01/2018 <0.10  <0.10 x10^3/uL Final         IMAGING:  -  I have reviewed all pertinent imaging and pathology reports      ASSESSMENT:    Kenneth Weeks is a 68 year old male with PMH of [place], mechanical aortic valve in 1999 (on anticoagulation), afib s/p cardioversion, DM, hypothyroidism who presented to [facility] with bloody diarrhea, supratherapeutic INR and hypotension.Colonoscopy remarkable for large mass along the first fold of the ascending colon concerning for malignancy. He underwent laproscopic right hemicolectomy on 06/15/14. Pathology revealed a 3.5 cm well differentiated to moderately differentiated adenocarcinoma, resection margins free, 0/30 nodes involved, LVI negative, perineural invasion not present. pT2N0, Stage I.  Patient is on active surveillance with periodic history and physical examination, CEA testing, CT imaging and endoscopic surveillance.  Patient is doing well and without acute complaints.    1. Colon cancer    PLAN:    1. Continue active surveillance.  Patient doing well and without acute complaints.  No signs or symptoms of recurrence.    2. Labs obtained and reviewed.  CEA stable- 2.8 today.  Will repeat CBC, CMP, and CEA at next visit.  3. Most recent CT CAP from 09/17/17 with no evidence of metastatic disease within the chest, abdomen, or pelvis.  Will repeat at one year (August 2020).  Order placed today.  4. Patient will be due for colonoscopy in December 2020.   5. RTC in six months for evaluation, sooner if needed.      The patient was seen as a shared visit with the co-signing faculty.    Amy Maust,  APRN,FNP-BC        This document was generated using a voice recognition system and transcription. All documents are proofed as best as possible, but it may have misspelled words, incorrect words, or syntax and grammatical errors because of the imperfect nature of the system.    I saw and examined the patient. I reviewed the APP's note. I agree with the findings and plan of care as documented in the APP's note. Any exceptions/additions are edited/noted.    No new complaints. Abd, soft, non tender. Bowel movements regular, no blood in stools. CBC, LFTs and BMP WNL. CEA 2.8. Due for colonoscopy in 01/2019. CT CAP due 09/2018.  Planning to move to [place] end of this summer.    Ardeth Sportsman, MD  Assistant Professor  [place]

## 2018-04-01 NOTE — Nurses Notes (Signed)
Straight stick to right AC without difficulty,labs drawn and sent.Patient tolerated well.Patient then ambulated to the 2nd floor clinic. Roxan Hockey, RN

## 2018-04-06 ENCOUNTER — Encounter (HOSPITAL_BASED_OUTPATIENT_CLINIC_OR_DEPARTMENT_OTHER): Payer: Self-pay | Admitting: Student in an Organized Health Care Education/Training Program

## 2018-04-06 ENCOUNTER — Other Ambulatory Visit (HOSPITAL_BASED_OUTPATIENT_CLINIC_OR_DEPARTMENT_OTHER): Payer: Self-pay

## 2018-04-06 DIAGNOSIS — C189 Malignant neoplasm of colon, unspecified: Secondary | ICD-10-CM

## 2018-04-07 ENCOUNTER — Ambulatory Visit (INDEPENDENT_AMBULATORY_CARE_PROVIDER_SITE_OTHER): Payer: Self-pay | Admitting: INTERNAL MEDICINE

## 2018-04-07 ENCOUNTER — Ambulatory Visit: Payer: Medicare Other | Attending: Family

## 2018-04-07 ENCOUNTER — Other Ambulatory Visit: Payer: Self-pay

## 2018-04-07 DIAGNOSIS — I482 Chronic atrial fibrillation, unspecified: Secondary | ICD-10-CM | POA: Insufficient documentation

## 2018-04-07 DIAGNOSIS — C189 Malignant neoplasm of colon, unspecified: Secondary | ICD-10-CM | POA: Insufficient documentation

## 2018-04-07 LAB — PT/INR
INR: 3.96
PROTHROMBIN TIME: 45.8 s — ABNORMAL HIGH (ref 9.4–12.5)

## 2018-04-07 NOTE — Progress Notes (Signed)
04/07/18 1300   [facility] PT/INR   Date INR drawn 04/07/18   INR 4.0   New warfarin dosage INR is at goal   New weekly dosgae Keep the same   Comments Check in one month   Initials Anabel Bene

## 2018-04-07 NOTE — Progress Notes (Signed)
Pt notified of results and verbalized understanding. Pt had no questions or concerns. Pt is currently taking Coumadin 5mg  2 tabs M,F and 1.5 tabs ROW. Repeat INR scheduled for 05/05/2018.    Kortnie C. Barrett, MA  04/07/2018, 14:40

## 2018-04-09 ENCOUNTER — Encounter (HOSPITAL_BASED_OUTPATIENT_CLINIC_OR_DEPARTMENT_OTHER): Payer: Self-pay | Admitting: Student in an Organized Health Care Education/Training Program

## 2018-05-05 ENCOUNTER — Other Ambulatory Visit: Payer: Self-pay

## 2018-05-06 ENCOUNTER — Ambulatory Visit: Payer: Medicare Other | Attending: INTERNAL MEDICINE

## 2018-05-06 ENCOUNTER — Ambulatory Visit (INDEPENDENT_AMBULATORY_CARE_PROVIDER_SITE_OTHER): Payer: Self-pay | Admitting: INTERNAL MEDICINE

## 2018-05-06 ENCOUNTER — Other Ambulatory Visit: Payer: Self-pay

## 2018-05-06 DIAGNOSIS — I482 Chronic atrial fibrillation, unspecified: Secondary | ICD-10-CM | POA: Insufficient documentation

## 2018-05-06 LAB — PT/INR
INR: 4.37
PROTHROMBIN TIME: 50.6 s — ABNORMAL HIGH (ref 9.4–12.5)

## 2018-05-06 NOTE — Progress Notes (Signed)
Pt notified of results and verbalized understanding. Pt had no questions or concerns and scheduled repeat INR for 06/02/2018. Pt states he is currently taking Coumadin 5 mg tabs 2 tabs on M,F and 1.5 tabs ROW.  Gari Hartsell C. Wilhelmena Zea, MA  05/06/2018, 14:38

## 2018-05-06 NOTE — Progress Notes (Signed)
05/06/18 1400   [facility] PT/INR   Date INR drawn 05/06/18   INR 4.3   New warfarin dosage INR is just a touch high   New weekly dosgae Hold coumadin one day, then resume normal   Comments Check INR in one month   Initials Elliot Simoneaux   Goal INR 2.5-3.5

## 2018-06-02 ENCOUNTER — Other Ambulatory Visit: Payer: Self-pay

## 2018-06-02 ENCOUNTER — Ambulatory Visit: Payer: Medicare Other | Attending: INTERNAL MEDICINE

## 2018-06-02 DIAGNOSIS — I482 Chronic atrial fibrillation, unspecified: Secondary | ICD-10-CM | POA: Insufficient documentation

## 2018-06-02 LAB — PT/INR
INR: 3.04
PROTHROMBIN TIME: 35 s — ABNORMAL HIGH (ref 9.4–12.5)

## 2018-06-06 ENCOUNTER — Ambulatory Visit (INDEPENDENT_AMBULATORY_CARE_PROVIDER_SITE_OTHER): Payer: Self-pay | Admitting: INTERNAL MEDICINE

## 2018-06-06 NOTE — Progress Notes (Signed)
Pt notified of results and verbalized understanding. Pt had no questions or concerns.  Wallie Renshaw, MA  06/06/2018, 16:00

## 2018-06-06 NOTE — Progress Notes (Signed)
06/06/18 1500   [facility] PT/INR   Date INR drawn 06/06/18   INR 3.0   New warfarin dosage INR is at goal   New weekly dosgae Keep the same   Comments Check in one month   Initials Asmara Backs   Goal INR 2.3-3.5

## 2018-07-03 ENCOUNTER — Other Ambulatory Visit (INDEPENDENT_AMBULATORY_CARE_PROVIDER_SITE_OTHER): Payer: Self-pay | Admitting: INTERNAL MEDICINE

## 2018-07-06 ENCOUNTER — Ambulatory Visit (INDEPENDENT_AMBULATORY_CARE_PROVIDER_SITE_OTHER): Payer: Self-pay | Admitting: INTERNAL MEDICINE

## 2018-07-06 ENCOUNTER — Other Ambulatory Visit (INDEPENDENT_AMBULATORY_CARE_PROVIDER_SITE_OTHER): Payer: Self-pay | Admitting: INTERNAL MEDICINE

## 2018-07-06 ENCOUNTER — Ambulatory Visit (HOSPITAL_BASED_OUTPATIENT_CLINIC_OR_DEPARTMENT_OTHER): Payer: Medicare Other | Attending: INTERNAL MEDICINE

## 2018-07-06 ENCOUNTER — Ambulatory Visit (HOSPITAL_BASED_OUTPATIENT_CLINIC_OR_DEPARTMENT_OTHER): Payer: Medicare Other | Admitting: INTERNAL MEDICINE

## 2018-07-06 DIAGNOSIS — Z7901 Long term (current) use of anticoagulants: Secondary | ICD-10-CM | POA: Insufficient documentation

## 2018-07-06 LAB — PT/INR
INR: 3.18
PROTHROMBIN TIME: 36.7 s — ABNORMAL HIGH (ref 9.4–12.5)

## 2018-08-08 ENCOUNTER — Ambulatory Visit: Payer: Medicare Other | Attending: INTERNAL MEDICINE

## 2018-08-08 ENCOUNTER — Ambulatory Visit (INDEPENDENT_AMBULATORY_CARE_PROVIDER_SITE_OTHER): Payer: Self-pay | Admitting: Internal Medicine

## 2018-08-08 ENCOUNTER — Other Ambulatory Visit: Payer: Self-pay

## 2018-08-08 DIAGNOSIS — I482 Chronic atrial fibrillation, unspecified: Secondary | ICD-10-CM | POA: Insufficient documentation

## 2018-08-08 LAB — PT/INR
INR: 2.74
PROTHROMBIN TIME: 31.5 s — ABNORMAL HIGH (ref 9.4–12.5)

## 2018-08-08 NOTE — Progress Notes (Signed)
INR 2.74 -good  Cont same dose of warfarin.   Please clarify dose with pt and update med list    Recheck INR 1 month

## 2018-08-09 NOTE — Progress Notes (Signed)
Left message to call us back

## 2018-08-11 NOTE — Progress Notes (Signed)
Pt notified of results and verbalized understanding. Pt states he takes 10mg  on Monday and Friday with 7.5mg  ROW. Pt will repeat INR when he is here for his appointment 09/08/2018.  Taunja Brickner C. Alexis Reber, MA  08/11/2018, 08:10

## 2018-09-07 ENCOUNTER — Encounter (INDEPENDENT_AMBULATORY_CARE_PROVIDER_SITE_OTHER): Payer: Self-pay

## 2018-09-08 ENCOUNTER — Other Ambulatory Visit: Payer: Self-pay

## 2018-09-08 ENCOUNTER — Encounter (INDEPENDENT_AMBULATORY_CARE_PROVIDER_SITE_OTHER): Payer: Self-pay | Admitting: INTERNAL MEDICINE

## 2018-09-08 ENCOUNTER — Ambulatory Visit: Payer: Medicare Other | Attending: INTERNAL MEDICINE

## 2018-09-08 ENCOUNTER — Ambulatory Visit (INDEPENDENT_AMBULATORY_CARE_PROVIDER_SITE_OTHER): Payer: Medicare Other | Admitting: INTERNAL MEDICINE

## 2018-09-08 VITALS — BP 168/90 | HR 82 | Temp 98.4°F | Ht 75.5 in | Wt 292.0 lb

## 2018-09-08 DIAGNOSIS — Z7901 Long term (current) use of anticoagulants: Secondary | ICD-10-CM

## 2018-09-08 DIAGNOSIS — Z6836 Body mass index (BMI) 36.0-36.9, adult: Secondary | ICD-10-CM

## 2018-09-08 DIAGNOSIS — I509 Heart failure, unspecified: Secondary | ICD-10-CM

## 2018-09-08 DIAGNOSIS — E119 Type 2 diabetes mellitus without complications: Secondary | ICD-10-CM

## 2018-09-08 DIAGNOSIS — E039 Hypothyroidism, unspecified: Secondary | ICD-10-CM

## 2018-09-08 DIAGNOSIS — I482 Chronic atrial fibrillation, unspecified: Secondary | ICD-10-CM | POA: Insufficient documentation

## 2018-09-08 DIAGNOSIS — E669 Obesity, unspecified: Secondary | ICD-10-CM

## 2018-09-08 LAB — PT/INR
INR: 2.61
PROTHROMBIN TIME: 30.1 s — ABNORMAL HIGH (ref 9.4–12.5)

## 2018-09-08 LAB — THYROID STIMULATING HORMONE WITH FREE T4 REFLEX: TSH: 1.554 u[IU]/mL (ref 0.340–5.330)

## 2018-09-08 MED ORDER — METOPROLOL SUCCINATE ER 200 MG TABLET,EXTENDED RELEASE 24 HR
200.0000 mg | ORAL_TABLET | Freq: Every day | ORAL | 3 refills | Status: DC
Start: 2018-09-08 — End: 2019-09-12

## 2018-09-08 NOTE — Nursing Note (Signed)
BP (!) 168/90   Pulse 82   Temp 36.9 C (98.4 F) (Thermal Scan)   Ht 1.918 m (6' 3.5")   Wt 132 kg (292 lb)   SpO2 97%   BMI 36.02 kg/m     Charlyne Petrin, LPN  03/30/4710, 52:71
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
## 2018-09-09 ENCOUNTER — Ambulatory Visit (INDEPENDENT_AMBULATORY_CARE_PROVIDER_SITE_OTHER): Payer: Self-pay | Admitting: INTERNAL MEDICINE

## 2018-09-09 NOTE — Progress Notes (Signed)
Pt notified of results and verbalized understanding. Pt had no questions or concerns.  Wallie Renshaw, MA  09/09/2018, 14:24

## 2018-09-09 NOTE — Progress Notes (Signed)
09/09/18 1100   [facility] PT/INR   Date INR drawn 09/09/18   INR 2.6   New warfarin dosage INR is at goal   New weekly dosgae Keep the same   Comments Check INR in one month   Initials Daniil Labarge   Goal INR 2.5-3.5

## 2018-10-02 ENCOUNTER — Other Ambulatory Visit (INDEPENDENT_AMBULATORY_CARE_PROVIDER_SITE_OTHER): Payer: Self-pay | Admitting: INTERNAL MEDICINE

## 2018-10-06 ENCOUNTER — Ambulatory Visit (HOSPITAL_BASED_OUTPATIENT_CLINIC_OR_DEPARTMENT_OTHER): Admission: RE | Admit: 2018-10-06 | Payer: Medicare Other | Source: Ambulatory Visit

## 2018-10-06 ENCOUNTER — Ambulatory Visit (HOSPITAL_BASED_OUTPATIENT_CLINIC_OR_DEPARTMENT_OTHER): Payer: Medicare Other | Admitting: Student in an Organized Health Care Education/Training Program

## 2018-10-06 ENCOUNTER — Other Ambulatory Visit (HOSPITAL_COMMUNITY): Payer: Self-pay

## 2018-10-10 ENCOUNTER — Ambulatory Visit: Payer: Medicare Other | Attending: INTERNAL MEDICINE

## 2018-10-10 ENCOUNTER — Other Ambulatory Visit: Payer: Self-pay

## 2018-10-10 DIAGNOSIS — I482 Chronic atrial fibrillation, unspecified: Secondary | ICD-10-CM | POA: Insufficient documentation

## 2018-10-10 LAB — PT/INR
INR: 2.86
PROTHROMBIN TIME: 33 s — ABNORMAL HIGH (ref 9.4–12.5)

## 2018-10-13 ENCOUNTER — Ambulatory Visit (INDEPENDENT_AMBULATORY_CARE_PROVIDER_SITE_OTHER): Payer: Self-pay | Admitting: INTERNAL MEDICINE

## 2018-10-19 ENCOUNTER — Other Ambulatory Visit: Payer: Self-pay

## 2018-11-10 ENCOUNTER — Other Ambulatory Visit: Payer: Self-pay

## 2018-11-10 ENCOUNTER — Ambulatory Visit: Payer: Medicare Other | Attending: INTERNAL MEDICINE

## 2018-11-10 ENCOUNTER — Ambulatory Visit (INDEPENDENT_AMBULATORY_CARE_PROVIDER_SITE_OTHER): Payer: Self-pay | Admitting: INTERNAL MEDICINE

## 2018-11-10 DIAGNOSIS — Z7901 Long term (current) use of anticoagulants: Secondary | ICD-10-CM | POA: Insufficient documentation

## 2018-11-10 LAB — PT/INR
INR: 3.26
PROTHROMBIN TIME: 37.6 s — ABNORMAL HIGH (ref 9.4–12.5)

## 2018-11-10 NOTE — Progress Notes (Signed)
Left VM for patient to call back.  Gregary Cromer, MA  11/10/2018, 16:50

## 2018-11-10 NOTE — Progress Notes (Signed)
Please inform RICK --   His INR is fine     Keep the same   Check in one MONTH     Thanks

## 2018-11-11 NOTE — Progress Notes (Signed)
Pt notified of INR results and verbalized understanding. Pt has F/U lab appt on 12/08/2018.  Gregary Cromer, MA  11/11/2018, 10:32

## 2018-11-27 ENCOUNTER — Other Ambulatory Visit (INDEPENDENT_AMBULATORY_CARE_PROVIDER_SITE_OTHER): Payer: Self-pay | Admitting: INTERNAL MEDICINE

## 2018-12-08 ENCOUNTER — Other Ambulatory Visit: Payer: Self-pay

## 2018-12-08 ENCOUNTER — Ambulatory Visit: Payer: Medicare Other | Attending: INTERNAL MEDICINE

## 2018-12-08 DIAGNOSIS — Z7901 Long term (current) use of anticoagulants: Secondary | ICD-10-CM | POA: Insufficient documentation

## 2018-12-08 LAB — PT/INR
INR: 2.96
PROTHROMBIN TIME: 34.1 s — ABNORMAL HIGH (ref 9.4–12.5)

## 2018-12-21 ENCOUNTER — Telehealth (HOSPITAL_BASED_OUTPATIENT_CLINIC_OR_DEPARTMENT_OTHER): Payer: Self-pay | Admitting: Student in an Organized Health Care Education/Training Program

## 2018-12-21 NOTE — Telephone Encounter (Signed)
Called patient. Patient has not rescheduled appointment with Dr. Landry Corporal or his CT scan. Patient states he is staying in right now. He said he will call and reschedule when covid gets better. Patient has no complaints at this time. He said he is feeling wll. I told him to call with any questions or concerns. Erik Obey RN

## 2019-01-09 ENCOUNTER — Other Ambulatory Visit: Payer: Self-pay

## 2019-01-09 ENCOUNTER — Ambulatory Visit: Payer: Medicare Other | Attending: INTERNAL MEDICINE

## 2019-01-09 DIAGNOSIS — Z7901 Long term (current) use of anticoagulants: Secondary | ICD-10-CM

## 2019-01-09 LAB — PT/INR
INR: 3.25
PROTHROMBIN TIME: 38.2 s — ABNORMAL HIGH (ref 9.4–12.5)

## 2019-02-01 ENCOUNTER — Other Ambulatory Visit (INDEPENDENT_AMBULATORY_CARE_PROVIDER_SITE_OTHER): Payer: Self-pay | Admitting: INTERNAL MEDICINE

## 2019-02-01 MED ORDER — FUROSEMIDE 40 MG TABLET
20.0000 mg | ORAL_TABLET | Freq: Every day | ORAL | 3 refills | Status: AC
Start: 2019-02-01 — End: ?

## 2019-02-13 ENCOUNTER — Encounter (INDEPENDENT_AMBULATORY_CARE_PROVIDER_SITE_OTHER): Payer: Self-pay | Admitting: INTERNAL MEDICINE

## 2019-02-13 ENCOUNTER — Other Ambulatory Visit: Payer: Self-pay

## 2019-02-13 ENCOUNTER — Ambulatory Visit: Payer: Medicare Other | Attending: INTERNAL MEDICINE

## 2019-02-13 DIAGNOSIS — Z7901 Long term (current) use of anticoagulants: Secondary | ICD-10-CM | POA: Insufficient documentation

## 2019-02-13 LAB — PT/INR
INR: 3.3
PROTHROMBIN TIME: 38.7 s — ABNORMAL HIGH (ref 9.4–12.5)

## 2019-03-11 ENCOUNTER — Other Ambulatory Visit: Payer: Self-pay

## 2019-03-15 ENCOUNTER — Ambulatory Visit: Payer: Medicare Other | Attending: INTERNAL MEDICINE

## 2019-03-15 ENCOUNTER — Other Ambulatory Visit: Payer: Self-pay

## 2019-03-15 DIAGNOSIS — Z7901 Long term (current) use of anticoagulants: Secondary | ICD-10-CM | POA: Insufficient documentation

## 2019-03-15 LAB — PT/INR
INR: 3.81
PROTHROMBIN TIME: 44.8 s — ABNORMAL HIGH (ref 9.4–12.5)

## 2019-04-11 ENCOUNTER — Ambulatory Visit: Payer: Medicare Other | Attending: INTERNAL MEDICINE

## 2019-04-11 ENCOUNTER — Other Ambulatory Visit: Payer: Self-pay

## 2019-04-11 DIAGNOSIS — Z7901 Long term (current) use of anticoagulants: Secondary | ICD-10-CM

## 2019-04-11 LAB — PT/INR
INR: 2.76
PROTHROMBIN TIME: 32.3 s — ABNORMAL HIGH (ref 9.4–12.5)

## 2019-04-17 ENCOUNTER — Ambulatory Visit (VACCINATION_CLINIC): Payer: Self-pay

## 2019-04-18 NOTE — Progress Notes (Signed)
CARDIOLOGY [address] [place] [address]  Phone: [phone]  Fax: [phone]    Date: 04/19/2019  Patient Name: Kenneth Weeks  MRN#: [id number]  DOB: November 27, 1950    Provider: Barnet Glasgow, MD  PCP: Viona Gilmore, MD      Reason for visit: Follow Up    History:     Kenneth Weeks is a 69 y.o. male with a history of Afib, AS s/p AVR, Cardiomyopathy/CHF, and Pulmonary HTN. He was last seen on 12/30/17 when he reported feeling well from a cardiac standpoint despite gaining weight and having elevated BP at home which he attributed to his caffeine intake. He was advised to restart Eplerenone 25mg  daily given no clear indication why this was stopped, and was told to stop Kdur. Lisinopril was stopped and Entresto 24-26mg  BID was started. Lasix was decreased to 20mg  daily given he started [place]. A BMP was also ordered to be done before follow-up in 6 weeks.    Since his office visit, he had been taken off of his Delene Loll and was restarted in Lisinopril by his PCP given that the pt had been experiencing lower back pains that he had been concerned was due to his kidneys.     Today he reports that about a week ago, he had a fall while standing up with associated elevated HR's in the 120-130's. He denies any LOC or presyncope with this, but his wife believes this may been attributed to dehydration. Since this episode, he has been feeling well from a cardiac standpoint.     No c/o any CP, SOB, palpitations, syncope/presyncope, orthopnea/PND, LE edema, or claudication.    He has been tolerating all of his medications well, and is compliant in taking them all. He has been having his coumadin levels managed monthly and they are in a therapeutic range per his report.     Past Medical History:     Past Medical History:   Diagnosis Date   . A-fib (CMS HCC)     Chronic anti-coagulant therapy; Hx cardioversions   . Abdominal hernia     Ventral hernia; Left Inguinal hernia   . Acute on chronic  renal failure (CMS HCC)    . Biliary and gallbladder disorder     "Gallstones"   . Blood thinned due to long-term anticoagulant use    . Bruises easily    . Cancer (CMS HCC) 04/09/2017    Colon grade 0 surgery   . Congestive heart failure (CMS HCC) 04/09/2017    CHF 01/2017    . Diabetes mellitus (CMS [place])     type ii diet controlled   . Disorder of liver 04/09/2017    fatty liver   . Dysrhythmias     a fib   . Ejection fraction < 50%    . Heart murmur    . Hemorrhage of gastrointestinal tract 5/16   . Hemorrhagic shock (CMS HCC)    . Hepatic cirrhosis (CMS HCC) 123XX123    non alcoholic   . Hx of transfusion 04/09/2017    no reaction   . Hydrocele    . Hypertension 07/10/2014   . Hypothyroid    . Marfan syndrome    . Neck problem 04/09/2017    LROM   . Non-healing non-surgical wound    . Obesity    . Palpitations    . Peripheral vascular disease (CMS [place]) 04/09/2017    no recent problems since starting lasix  in 01/2017   . Syncope    . Thyroid disease     hypothyroid   . Transfusion history     s/p Multiple units PRC/FFP this adm.   . Type 2 diabetes mellitus (CMS [place]) 04/09/2017    diet controlled, HA1C 6.0   . Valvular disease 04/09/2017    St Judes aortic valve replacement-chronic anti-coagulant therapy   . Wears glasses          Past Surgical History:     Past Surgical History:   Procedure Laterality Date   . Colonoscopy     . Hx colonoscopy     . Hx heart catheterization     . Hx heart valve surgery  1999   . Hx hemicolectomy Right 07/13/2014   . Hx tonsil and adenoidectomy     . Hx tonsillectomy     . Hx upper endoscopy  07/09/14   . Hx wisdom teeth extraction       Allergies:     Allergies   Allergen Reactions   . Allegra [Fexofenadine]  Other Adverse Reaction (Add comment)     "retain fluid"   . Antihistamines - Alkylamine      Pt states antihistamines cause him to retain fluid    . Antihistamines - Ethanolamine        Medications:     Current Outpatient Medications   Medication Sig   . cholecalciferol,  vitamin D3, 1,000 unit Oral Tablet Take 1,000 Units by mouth Once a day   . docusate sodium (COLACE) 100 mg Oral Capsule Take 1 Cap (100 mg total) by mouth Twice daily   . furosemide (LASIX) 40 mg Oral Tablet Take 0.5 Tabs (20 mg total) by mouth Once a day 1 TWICE PER DAY FOR SWELLING   . levothyroxine (SYNTHROID) 200 mcg Oral Tablet Take 1 tablet by mouth once daily   . lisinopriL (PRINIVIL) 5 mg Oral Tablet TAKE ONE TABLET BY MOUTH ONCE DAILY   . MAGNESIUM CARBONATE ORAL Take by mouth Once a day   . Metoprolol Succinate (TOPROL-XL) 200 mg Oral Tablet Sustained Release 24 hr Take 1 Tab (200 mg total) by mouth Once a day   . vitamin B complex Oral Tablet Take 1 Tab by mouth Once a day   . warfarin (JANTOVEN) 5 mg Oral Tablet TAKE 2 TABLETS BY MOUTH ON MONDAYS AND FRIDAYS AND TAKE 1 & 1/2 TABLETS ALL OTHER DAYS OR AS DIRECTED TO BE ADJUSTED       Family History:     Family Medical History:     Problem Relation (Age of Onset)    Cirrhosis Brother    Hypertension (High Blood Pressure) Mother    Lung Cancer Father              Social History:     Social History     Socioeconomic History   . Marital status: Married     Spouse name: Not on file   . Number of children: Not on file   . Years of education: Not on file   . Highest education level: Not on file   Tobacco Use   . Smoking status: Never Smoker   . Smokeless tobacco: Never Used   Substance and Sexual Activity   . Alcohol use: No     Alcohol/week: 0.0 standard drinks   . Drug use: No   Other Topics Concern   . Ability to Walk 1 Flight of Steps without SOB/CP Yes   .  Ability to Walk 2 Flight of Steps without SOB/CP Yes   . Ability To Do Own ADL's Yes   . Other Activity Level Yes     Comment: walks a lot a work, up and down steps several times a day     Social Determinants of Company secretary Strain:    . Difficulty of Paying Living Expenses:    Food Insecurity:    . Worried About Charity fundraiser in the Last Year:    . Arboriculturist in the Last  Year:    Transportation Needs:    . Film/video editor (Medical):    Marland Kitchen Lack of Transportation (Non-Medical):    Physical Activity:    . Days of Exercise per Week:    . Minutes of Exercise per Session:    Stress:    . Feeling of Stress :    Intimate Partner Violence:    . Fear of Current or Ex-Partner:    . Emotionally Abused:    Marland Kitchen Physically Abused:    . Sexually Abused:        Review of Systems:     All other systems were reviewed and are negative other than noted in the HPI.    Physical Exam:     Vitals:    04/19/19 0806   BP: (!) 140/78   Pulse: 72   SpO2: 96%   Weight: 134 kg (295 lb)   Body mass index is 36.39 kg/m.    General:No acute distress, overweight  HEENT: Mask on  Neck: Supple, no JVD, no bruits  Heart/Cardiovascular:  Irregular  No murmur heard today, crisp valve click  2+ radial pulses  Respiratory/Lungs: Non-labored, clear to auscultation  Abdominal/Gastrointestinal: Soft, non-tender  Extremities: Warm and well-perfused, chronic 1+ LE edema  Skin: No rash  Neurological: Alert and oriented x3, grossly nonfocal  Psych: Normal affect    Cardiovascular Workup:     Hx of DCCV    AVR 1999 ([place])  1. [employer]. Judes mechanical prosthetic AV.    Echo 07/11/14  1. Left ventricle: The LV systolic function is normal. LV ejection fraction is 55% - 60%. LV diastolic function is unable to be assessed. The study was not technically sufficient to allow evaluation of LV diastolic dysfunction due to atrial fibrillation. There was no evidence of increased ventricular filling pressure by Doppler parameters.  2. Tricuspid valve: Mild-moderate tricuspid valve regurgitation.  3. Left atrium: The left atrial size is moderately dilated.    Echo 12/14/16  1. Mildly dilated left ventricle. Mildly depressed left ventricular systolic function. LV Ejection Fraction is 45 %. Normal geometry. Left ventricular diastolic function could not be assessed due to the presence of atrial fibrillation during the study.  2.  Resting Segmental Wall Motion Analysis: Total wall motion score is 2.00. There is global left ventricular hypokinesis.  3. Moderately dilated right ventricle. Mildly depressed right ventricular systolic function. RV systolic pressure is consistent with at least mild-to-moderate pulmonary hypertension (TR peak gradient 41 mmHg). RA pressure could not be assessed as the IVC is not well visualized.  4. Severely dilated left atrium.  5. Moderately dilated right atrium.  6. There is mild primary mitral valve regurgitation.  7. A mechanical prosthetic valve is present in the aortic position. The aortic valve prosthesis appears well seated. The aortic prosthesis demonstrates a moderately increased transvalvular gradient for valve type and size. This is suggestive of moderate prosthetic aortic stenosis.  Nuclear Stress Test 01/20/17  1.The patient had decreased perfusion both at rest and stress in the inferior lateral wall and this was considered a mild area of moderate intensity, consistent with diaphragmatic attenuation artifact. There was no evidence of ischemia noted.   2.The patient had septal akinesis and mild global hypokinesis noted on gated wall motion analysis with an ejection fraction moderately decreased at 36%.  3.The patient was noted to have a dilated left ventricle, both at rest and stress.    Echo 12/17/17  1. Normal left ventricular size. LV Ejection Fraction is 35-40 %. Concentric hypertrophy. Left ventricular diastolic function could not be assessed due to the presence of atrial fibrillation during the study.  2. There is mild primary mitral valve regurgitation.  3. No aortic regurgitation seen. A bileaflet tilting disk mechanical prosthetic valve is present in the aortic position.    EKG 12/30/17  Afib.    ECG 04/19/19  Afib    Assessment and Plan:     Kenneth Weeks is a 69 y.o. male with    AS:s/p St. Judes AVR in 1999. Mechanical prosthetic AV suggestive of moderate prosthetic AS on  12/14/16 Echo. Echo 12/17/17 bileaflet tilting disk mechanical prosthetic valve is present in the aortic position.  -on Coumadin with goal INR 2.5-3.5  - not on aspirin given h/o GI bleed  - TTE as below     Cardiomyopathy/Diastolic CHF:EF 0000000 on 07/11/14 Echo and now EF 45%, mildly dilated LV, and global LV hypokinesis on 12/14/16 Echo.Nuc. Stress test 01/20/17- No evidence of ischemia noted, septal akinesis and mild global hypokinesis with an EF of 36%, and a dilated LV, bot at rest and stress. Echo 12/17/17 EF 35-40%.   - NYHA class I  - intolerant to Spironolactone(gynecomastia)  - continue on Toprol-XL and Lisinopril   - Entresto stopped by Dr. Anabel Bene due to back pain, back on ACE-I  - TTE to reassess heart structure and function  - will have to consider re-trying Entresto and/or Eplerenone if EF is worse    A-Fib:Hx of DCCV. Echo 12/14/16- Presence of A-Fib, severely dilated LA, and moderately dilated RA.  - CHA2DS2-VASc score of 3 (age + HTN + CMP/CHF)  - after 5+ years of A Fib it is not likely that we will be able to successfully rhythm control him  - continue Coumadin for stroke prophylaxis  - continue Toprol-XL for rate control    HTN:BP elevated today; mildly elevated at home but attributed this to stress with building a new house    - Instructed to monitor BP at home, keep a log, and bring to next visit  - goal <130/80    Lipids:FLP 07/10/14- Total 74, Trig. 115, HDL 18, LDL 33  - encouraged heart-healthy diet (low in saturated fat, avoid simple carbs, high in vegetables/fish) and regular aerobic exercise (goal 30 min/day for 5 days/week)    Nonsmoker    Hx ofMarfan's Syndrome(?):  - unclear how diagnosed; no h/o ectopia lentis, aortic root normal in size in past      Follow up in 4-6 weeks      Thank you for allowing me to participate in the care of your patient. Please feel free to contact me if there are further questions.     I am scribing for, and in the presence of Dr. Barnet Glasgow, MD for services provided on 04/19/2019.  [place] S. Hiles, Mims Hiles, SCRIBE  04/19/2019, 08:19  I personally performed the services described in this documentation, as scribed in my presence, and it is both accurate and complete.    Barnet Glasgow, MD    Barnet Glasgow, MD  04/19/2019, 08:30

## 2019-04-19 ENCOUNTER — Ambulatory Visit (INDEPENDENT_AMBULATORY_CARE_PROVIDER_SITE_OTHER): Payer: Medicare Other | Admitting: Cardiovascular Disease

## 2019-04-19 ENCOUNTER — Encounter (INDEPENDENT_AMBULATORY_CARE_PROVIDER_SITE_OTHER): Payer: Self-pay | Admitting: Cardiovascular Disease

## 2019-04-19 ENCOUNTER — Other Ambulatory Visit: Payer: Self-pay

## 2019-04-19 VITALS — BP 140/78 | HR 72 | Wt 295.0 lb

## 2019-04-19 DIAGNOSIS — I38 Endocarditis, valve unspecified: Secondary | ICD-10-CM

## 2019-04-19 DIAGNOSIS — I10 Essential (primary) hypertension: Secondary | ICD-10-CM

## 2019-04-19 DIAGNOSIS — I509 Heart failure, unspecified: Secondary | ICD-10-CM

## 2019-04-19 DIAGNOSIS — R9431 Abnormal electrocardiogram [ECG] [EKG]: Secondary | ICD-10-CM

## 2019-04-19 LAB — ECG W INTERP (CLINIC ONLY)
Atrial Rate: 85 {beats}/min
Calculated R Axis: 16 degrees
Calculated T Axis: 56 degrees
QRS Duration: 98 ms
QT Interval: 398 ms
QTC Calculation: 450 ms
Ventricular rate: 77 {beats}/min

## 2019-05-08 ENCOUNTER — Ambulatory Visit (VACCINATION_CLINIC): Payer: Self-pay

## 2019-05-11 ENCOUNTER — Ambulatory Visit: Payer: Medicare Other | Attending: INTERNAL MEDICINE

## 2019-05-11 ENCOUNTER — Other Ambulatory Visit: Payer: Self-pay

## 2019-05-11 DIAGNOSIS — Z7901 Long term (current) use of anticoagulants: Secondary | ICD-10-CM | POA: Insufficient documentation

## 2019-05-11 LAB — PT/INR
INR: 2.71
PROTHROMBIN TIME: 31.7 s — ABNORMAL HIGH (ref 9.4–12.5)

## 2019-05-15 NOTE — Progress Notes (Signed)
CARDIOLOGY [address] [place] [address]  Phone: [phone]  Fax: [phone]    Date: 05/18/2019  Patient Name: Kenneth Weeks  MRN#: [id number]  DOB: Jun 30, 1950    Provider: Barnet Glasgow, MD  PCP: Viona Gilmore, MD      Reason for visit: CHF    History:     Kenneth Weeks is a 69 y.o. male with a history of Afib, AS s/p AVR, Cardiomyopathy/CHF, and Pulmonary HTN. He was last seen on 04/19/19 when an echo was ordered to reassess EF.    Today he is feeling well with no complaints of any any CP, SOB, palpitations, syncope/presyncope, orthopnea/PND, or claudication.  INRs in range.  Trace ankle edema.  Home BPs 120-130's/80s.    Past Medical History:     Past Medical History:   Diagnosis Date    A-fib (CMS [facility])     Chronic anti-coagulant therapy; Hx cardioversions    Abdominal hernia     Ventral hernia; Left Inguinal hernia    Acute on chronic renal failure (CMS HCC)     Biliary and gallbladder disorder     "Gallstones"    Blood thinned due to long-term anticoagulant use     Bruises easily     Cancer (CMS HCC) 04/09/2017    Colon grade 0 surgery    Congestive heart failure (CMS HCC) 04/09/2017    CHF 01/2017     Diabetes mellitus (CMS [place])     type ii diet controlled    Disorder of liver 04/09/2017    fatty liver    Dysrhythmias     a fib    Ejection fraction < 50%     Heart murmur     Hemorrhage of gastrointestinal tract 5/16    Hemorrhagic shock (CMS HCC)     Hepatic cirrhosis (CMS [place]) 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     Type 2 diabetes mellitus (CMS [place]) 04/09/2017    diet controlled, HA1C 6.0    Valvular disease 04/09/2017    St Judes aortic valve replacement-chronic anti-coagulant therapy    Wears glasses          Past Surgical History:     Past Surgical History:   Procedure Laterality Date    Colonoscopy      Hx colonoscopy      Hx heart catheterization      Hx heart valve surgery  1999    Hx hemicolectomy Right 07/13/2014    Hx tonsil and adenoidectomy      Hx tonsillectomy      Hx upper endoscopy  07/09/14    Hx wisdom teeth extraction       Allergies:     Allergies   Allergen Reactions    Allegra [Fexofenadine]  Other Adverse Reaction (Add comment)     "  retain fluid"    Antihistamines - Alkylamine      Pt states antihistamines cause him to retain fluid     Antihistamines - Ethanolamine        Medications:     Outpatient Medications Marked as Taking for the 05/18/19 encounter (Office Visit) with Kariya Lavergne, Monia Sabal, MD   Medication Sig    cholecalciferol, vitamin D3, 1,000 unit Oral Tablet Take 1,000 Units by mouth Once a day    docusate sodium (COLACE) 100 mg Oral Capsule Take 1 Cap (100 mg total) by mouth Twice daily    furosemide (LASIX) 40 mg Oral Tablet Take 0.5 Tabs (20 mg total) by mouth Once a day 1 TWICE PER DAY FOR SWELLING    levothyroxine (SYNTHROID) 200 mcg Oral Tablet Take 1 tablet by mouth once daily    lisinopriL (PRINIVIL) 10 mg Oral Tablet Take 1 Tablet (10 mg total) by mouth Once a day    MAGNESIUM CARBONATE ORAL Take by mouth Once a day    Metoprolol Succinate (TOPROL-XL) 200 mg Oral Tablet Sustained Release 24 hr Take 1 Tab (200 mg total) by mouth Once a day    vitamin B complex Oral Tablet Take 1 Tab by mouth Once a day    warfarin (JANTOVEN) 5 mg Oral Tablet TAKE 2 TABLETS BY MOUTH ON MONDAYS AND FRIDAYS AND TAKE 1 & 1/2 TABLETS ALL OTHER DAYS OR AS DIRECTED TO BE ADJUSTED     Family History:     Family Medical History:     Problem Relation (Age of Onset)    Cirrhosis Brother     Hypertension (High Blood Pressure) Mother    Lung Cancer Father              Social History:     Social History     Socioeconomic History    Marital status: Married     Spouse name: Not on file    Number of children: Not on file    Years of education: Not on file    Highest education level: Not on file   Tobacco Use    Smoking status: Never Smoker    Smokeless tobacco: Never Used   Substance and Sexual Activity    Alcohol use: No     Alcohol/week: 0.0 standard drinks    Drug use: No   Other Topics Concern    Ability to Walk 1 Flight of Steps without SOB/CP Yes    Ability to Walk 2 Flight of Steps without SOB/CP Yes    Ability To Do Own ADL's Yes    Other Activity Level Yes     Comment: walks a lot a work, up and down steps several times a day     Social Determinants of Company secretary Strain:     Difficulty of Paying Living Expenses:    Food Insecurity:     Worried About Charity fundraiser in the Last Year:     Arboriculturist in the Last Year:    Transportation Needs:     Film/video editor (Medical):     Lack of Transportation (Non-Medical):    Physical Activity:     Days of Exercise per Week:     Minutes of Exercise per Session:    Stress:     Feeling of Stress :    Intimate Partner Violence:     Fear of Current or Ex-Partner:     Emotionally Abused:  Physically Abused:     Sexually Abused:        Review of Systems:     All other systems were reviewed and are negative other than noted in the HPI.    Physical Exam:     Vitals:    05/18/19 1442   BP: (!) 128/92   Pulse: 95   Resp: 16   SpO2: 96%   Weight: 133 kg (293 lb 6.4 oz)   Body mass index is 36.19 kg/m.    General:No acute distress, overweight  HEENT: Mask on  Neck: Supple, no JVD, no bruits  Heart/Cardiovascular:  Irregular  No murmur heard today, crisp valve click  2+ radial pulses  Respiratory/Lungs: Non-labored, clear to auscultation  Abdominal/Gastrointestinal: Soft, non-tender  Extremities: Warm and  well-perfused, chronic 1+ ankle edema  Skin: LE stasis changes  Neurological: Alert and oriented x3, grossly nonfocal  Psych: Normal affect    Cardiovascular Workup:     Hx of DCCV    AVR 1999 ([place])  1. [employer]. Judes mechanical prosthetic AV.    Echo 07/11/14  1. Left ventricle: The LV systolic function is normal. LV ejection fraction is 55% - 60%. LV diastolic function is unable to be assessed. The study was not technically sufficient to allow evaluation of LV diastolic dysfunction due to atrial fibrillation. There was no evidence of increased ventricular filling pressure by Doppler parameters.  2. Tricuspid valve: Mild-moderate tricuspid valve regurgitation.  3. Left atrium: The left atrial size is moderately dilated.    Echo 12/14/16  1. Mildly dilated left ventricle. Mildly depressed left ventricular systolic function. LV Ejection Fraction is 45 %. Normal geometry. Left ventricular diastolic function could not be assessed due to the presence of atrial fibrillation during the study.  2. Resting Segmental Wall Motion Analysis: Total wall motion score is 2.00. There is global left ventricular hypokinesis.  3. Moderately dilated right ventricle. Mildly depressed right ventricular systolic function. RV systolic pressure is consistent with at least mild-to-moderate pulmonary hypertension (TR peak gradient 41 mmHg). RA pressure could not be assessed as the IVC is not well visualized.  4. Severely dilated left atrium.  5. Moderately dilated right atrium.  6. There is mild primary mitral valve regurgitation.  7. A mechanical prosthetic valve is present in the aortic position. The aortic valve prosthesis appears well seated. The aortic prosthesis demonstrates a moderately increased transvalvular gradient for valve type and size. This is suggestive of moderate prosthetic aortic stenosis.    Nuclear Stress Test 01/20/17  1.The patient had decreased perfusion both at rest and stress in the inferior lateral wall  and this was considered a mild area of moderate intensity, consistent with diaphragmatic attenuation artifact. There was no evidence of ischemia noted.   2.The patient had septal akinesis and mild global hypokinesis noted on gated wall motion analysis with an ejection fraction moderately decreased at 36%.  3.The patient was noted to have a dilated left ventricle, both at rest and stress.    Echo 12/17/17  1. Normal left ventricular size. LV Ejection Fraction is 35-40 %. Concentric hypertrophy. Left ventricular diastolic function could not be assessed due to the presence of atrial fibrillation during the study.  2. There is mild primary mitral valve regurgitation.  3. No aortic regurgitation seen. A bileaflet tilting disk mechanical prosthetic valve is present in the aortic position.    EKG 12/30/17  Afib.    ECG 04/19/19  Afib    Echo 05/18/19  Mildly dilated left  ventricle.  Normal geometry.  Moderately depressed left ventricular systolic function.  Left ventricular diastolic function could not be assessed due to the presence of AFib.  Mild mitral annular calcification.  There is mild mitral regurgitation.  There is a 29 bi-leaflet mechanical prosthesis in the aortic position.  The aortic prosthesis demonstrates a normal transvalvular gradient for valve type and size.  The left ventricular ejection fraction by visual assessment is estimated to be 40%.    Assessment and Plan:     Kenneth Weeks is a 69 y.o. male with    AS:s/p St. Judes AVR in 1999. 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   -on Coumadin with goal INR 2.5-3.5  - not on aspirin given h/o GI bleed  - echo to be done prior to follow up in 1 year    Cardiomyopathy/Diastolic CHF:EF 0000000 on 07/11/14 Echo and now EF 45%, mildly dilated LV, and global LV hypokinesis on 12/14/16 Echo.Nuc. Stress test 01/20/17- No evidence of ischemia noted, septal akinesis and mild global hypokinesis with an EF of 36%, and  a dilated LV, bot at rest and stress. Echo 12/17/17 EF 35-40%. Echo 05/18/19 EF 40%.  - NYHA class I  - intolerant to Spironolactone(gynecomastia)  - continue on Toprol-XL and Lisinopril with dose change as below  - Entresto stopped previously due to back pain  - echo to reassess prior to follow up in 1 year    A-Fib:Hx of DCCV. Echo 12/14/16- Presence of A-Fib, severely dilated LA, and moderately dilated RA.  - CHA2DS2-VASc score of 3 (age + HTN + CMP/CHF)  - after 5+ years of A Fib it is not likely that we will be able to successfully rhythm control him  - continue Coumadin for stroke prophylaxis  - continue Toprol-XL for rate control    HTN:dBP elevated today; better controlled at home but still slightly elevated   - Instructed to monitor BP at home, keep a log, and bring to next visit. Goal <130/80  - increase Lisinopril to 10mg  daily for both HTN and cardiomyopathy  - can increase further if not meeting goal by home BP log or at MD visits    Lipids:FLP 07/10/14- Total 74, Trig. 115, HDL 18, LDL 33  - encouraged heart-healthy diet (low in saturated fat, avoid simple carbs, high in vegetables/fish) and regular aerobic exercise (goal 30 min/day for 5 days/week)    Nonsmoker    Hx ofMarfan's Syndrome(?):  - unclear how diagnosed; no h/o ectopia lentis, aortic root normal in size in past      Follow up in 1 year    Thank you for allowing me to participate in the care of your patient. Please feel free to contact me if there are further questions.     I am scribing for, and in the presence of Dr. Barnet Glasgow, MD for services provided on 05/18/2019.  Paula Libra, SCRIBE    [place], [place]  05/18/2019, 14:57    I personally performed the services described in this documentation, as scribed in my presence, and it is both accurate and complete.    Barnet Glasgow, MD    Barnet Glasgow, MD  05/18/2019, 15:03

## 2019-05-18 ENCOUNTER — Ambulatory Visit (INDEPENDENT_AMBULATORY_CARE_PROVIDER_SITE_OTHER): Payer: Medicare Other | Admitting: Cardiovascular Disease

## 2019-05-18 ENCOUNTER — Other Ambulatory Visit: Payer: Self-pay

## 2019-05-18 ENCOUNTER — Ambulatory Visit
Admission: RE | Admit: 2019-05-18 | Discharge: 2019-05-18 | Disposition: A | Discharge status: Home / Self Care | Payer: Medicare Other | Source: Ambulatory Visit | Attending: Cardiovascular Disease | Admitting: Cardiovascular Disease

## 2019-05-18 ENCOUNTER — Encounter (INDEPENDENT_AMBULATORY_CARE_PROVIDER_SITE_OTHER): Payer: Self-pay | Admitting: Cardiovascular Disease

## 2019-05-18 VITALS — BP 128/92 | HR 95 | Resp 16 | Wt 293.4 lb

## 2019-05-18 DIAGNOSIS — I429 Cardiomyopathy, unspecified: Secondary | ICD-10-CM

## 2019-05-18 DIAGNOSIS — I38 Endocarditis, valve unspecified: Secondary | ICD-10-CM | POA: Insufficient documentation

## 2019-05-18 DIAGNOSIS — I4891 Unspecified atrial fibrillation: Secondary | ICD-10-CM

## 2019-05-18 DIAGNOSIS — I340 Nonrheumatic mitral (valve) insufficiency: Secondary | ICD-10-CM | POA: Insufficient documentation

## 2019-05-18 DIAGNOSIS — I509 Heart failure, unspecified: Secondary | ICD-10-CM | POA: Insufficient documentation

## 2019-05-18 DIAGNOSIS — I10 Essential (primary) hypertension: Secondary | ICD-10-CM

## 2019-05-18 DIAGNOSIS — I350 Nonrheumatic aortic (valve) stenosis: Secondary | ICD-10-CM

## 2019-05-18 MED ORDER — SODIUM CHLORIDE 0.9 % INJECTION SOLUTION
2.00 mL | INTRAMUSCULAR | Status: AC
Start: 2019-05-18 — End: 2019-05-18
  Administered 2019-05-18: 2 mL via INTRAVENOUS

## 2019-05-18 MED ORDER — LISINOPRIL 10 MG TABLET
10.0000 mg | ORAL_TABLET | Freq: Every day | ORAL | 1 refills | Status: DC
Start: 2019-05-18 — End: 2019-06-13

## 2019-05-18 NOTE — Ancillary Notes (Signed)
Echo with definity completed  Definity given by myself  IV started by Sandy Markley RN

## 2019-05-18 NOTE — Patient Instructions (Signed)
Continue to check your blood pressure at home. Your goal blood pressure is less than 130s/80s. If you notice your home blood pressures are continually higher than this, please call our office.

## 2019-06-08 ENCOUNTER — Other Ambulatory Visit (INDEPENDENT_AMBULATORY_CARE_PROVIDER_SITE_OTHER): Payer: Self-pay

## 2019-06-13 ENCOUNTER — Other Ambulatory Visit: Payer: Self-pay

## 2019-06-13 ENCOUNTER — Other Ambulatory Visit (INDEPENDENT_AMBULATORY_CARE_PROVIDER_SITE_OTHER): Payer: Self-pay | Admitting: Cardiovascular Disease

## 2019-06-13 ENCOUNTER — Ambulatory Visit: Payer: Medicare Other | Attending: INTERNAL MEDICINE

## 2019-06-13 DIAGNOSIS — I429 Cardiomyopathy, unspecified: Secondary | ICD-10-CM

## 2019-06-13 DIAGNOSIS — Z7901 Long term (current) use of anticoagulants: Secondary | ICD-10-CM

## 2019-06-13 DIAGNOSIS — I4891 Unspecified atrial fibrillation: Secondary | ICD-10-CM

## 2019-06-13 DIAGNOSIS — I10 Essential (primary) hypertension: Secondary | ICD-10-CM

## 2019-06-13 DIAGNOSIS — I350 Nonrheumatic aortic (valve) stenosis: Secondary | ICD-10-CM

## 2019-06-13 LAB — PT/INR
INR: 3.54
PROTHROMBIN TIME: 41.6 s — ABNORMAL HIGH (ref 9.4–12.5)

## 2019-06-13 MED ORDER — LISINOPRIL 20 MG TABLET
20.00 mg | ORAL_TABLET | Freq: Every day | ORAL | 1 refills | Status: AC
Start: 2019-06-13 — End: ?

## 2019-06-13 NOTE — Telephone Encounter (Signed)
LVM for patient advising of medication change. Encouraged patient to return call if he had any further questions or concerns.     Gerald Dexter, RN  06/13/2019, 10:38

## 2019-06-13 NOTE — Telephone Encounter (Signed)
Plitt, Monia Sabal, MD  Gerald Dexter, RN  Thanks. They are not bad but diastolic is still mildly elevated. Would increase Lisinopril to 20 mg. Thanks

## 2019-07-17 ENCOUNTER — Ambulatory Visit: Payer: Medicare Other | Attending: INTERNAL MEDICINE

## 2019-07-17 ENCOUNTER — Other Ambulatory Visit: Payer: Self-pay

## 2019-07-17 DIAGNOSIS — Z7901 Long term (current) use of anticoagulants: Secondary | ICD-10-CM | POA: Insufficient documentation

## 2019-07-17 LAB — PT/INR
INR: 3.3
PROTHROMBIN TIME: 38.8 seconds — ABNORMAL HIGH (ref 9.4–12.5)

## 2019-08-05 ENCOUNTER — Other Ambulatory Visit (INDEPENDENT_AMBULATORY_CARE_PROVIDER_SITE_OTHER): Payer: Self-pay | Admitting: INTERNAL MEDICINE

## 2019-08-07 NOTE — Telephone Encounter (Signed)
Pt last seen  09/08/2018. Pt needs an appt.

## 2019-08-15 ENCOUNTER — Ambulatory Visit: Payer: Medicare Other | Attending: INTERNAL MEDICINE

## 2019-08-15 ENCOUNTER — Other Ambulatory Visit: Payer: Self-pay

## 2019-08-15 DIAGNOSIS — Z7901 Long term (current) use of anticoagulants: Secondary | ICD-10-CM | POA: Insufficient documentation

## 2019-08-15 LAB — PT/INR
INR: 2.54
PROTHROMBIN TIME: 29.7 s — ABNORMAL HIGH (ref 9.4–12.5)

## 2019-08-18 ENCOUNTER — Encounter (INDEPENDENT_AMBULATORY_CARE_PROVIDER_SITE_OTHER): Payer: Self-pay

## 2019-09-11 ENCOUNTER — Other Ambulatory Visit (INDEPENDENT_AMBULATORY_CARE_PROVIDER_SITE_OTHER): Payer: Self-pay | Admitting: INTERNAL MEDICINE

## 2019-09-14 ENCOUNTER — Ambulatory Visit: Payer: Medicare Other | Attending: INTERNAL MEDICINE

## 2019-09-14 ENCOUNTER — Other Ambulatory Visit: Payer: Self-pay

## 2019-09-14 DIAGNOSIS — Z7901 Long term (current) use of anticoagulants: Secondary | ICD-10-CM | POA: Insufficient documentation

## 2019-09-14 LAB — PT/INR
INR: 3.39
PROTHROMBIN TIME: 39.8 seconds — ABNORMAL HIGH (ref 9.4–12.5)

## 2019-10-12 ENCOUNTER — Other Ambulatory Visit: Payer: Self-pay

## 2019-10-12 ENCOUNTER — Ambulatory Visit: Payer: Medicare Other | Attending: INTERNAL MEDICINE

## 2019-10-12 DIAGNOSIS — Z7901 Long term (current) use of anticoagulants: Secondary | ICD-10-CM | POA: Insufficient documentation

## 2019-10-12 LAB — PT/INR
INR: 2.76
PROTHROMBIN TIME: 32.4 s — ABNORMAL HIGH (ref 9.4–12.5)

## 2019-10-12 NOTE — Progress Notes (Signed)
Pt notified of INR results and verbalized understanding as to keeping with his current coumadin dose. Pt has F/U lab appt on 11/09/2019.  Charlyne Petrin, LPN  03/19/32, 91:79

## 2019-11-09 ENCOUNTER — Ambulatory Visit: Payer: Medicare Other | Attending: INTERNAL MEDICINE

## 2019-11-09 ENCOUNTER — Other Ambulatory Visit: Payer: Self-pay

## 2019-11-09 DIAGNOSIS — Z7901 Long term (current) use of anticoagulants: Secondary | ICD-10-CM

## 2019-11-09 LAB — PT/INR
INR: 3.03
PROTHROMBIN TIME: 35.6 seconds — ABNORMAL HIGH (ref 9.4–12.5)

## 2019-11-19 ENCOUNTER — Other Ambulatory Visit (INDEPENDENT_AMBULATORY_CARE_PROVIDER_SITE_OTHER): Payer: Self-pay | Admitting: INTERNAL MEDICINE

## 2019-12-07 ENCOUNTER — Other Ambulatory Visit (INDEPENDENT_AMBULATORY_CARE_PROVIDER_SITE_OTHER): Payer: Self-pay

## 2019-12-11 ENCOUNTER — Encounter (INDEPENDENT_AMBULATORY_CARE_PROVIDER_SITE_OTHER): Payer: Self-pay | Admitting: INTERNAL MEDICINE

## 2019-12-11 ENCOUNTER — Other Ambulatory Visit: Payer: Self-pay

## 2019-12-11 ENCOUNTER — Ambulatory Visit: Payer: Medicare Other | Attending: INTERNAL MEDICINE

## 2019-12-11 ENCOUNTER — Ambulatory Visit (INDEPENDENT_AMBULATORY_CARE_PROVIDER_SITE_OTHER): Payer: Medicare Other | Admitting: INTERNAL MEDICINE

## 2019-12-11 VITALS — BP 140/80 | HR 103 | Temp 97.1°F | Ht 75.5 in | Wt 277.2 lb

## 2019-12-11 DIAGNOSIS — Z7901 Long term (current) use of anticoagulants: Secondary | ICD-10-CM | POA: Insufficient documentation

## 2019-12-11 DIAGNOSIS — L0390 Cellulitis, unspecified: Secondary | ICD-10-CM

## 2019-12-11 LAB — PT/INR
INR: 2.46
PROTHROMBIN TIME: 26.8 s — ABNORMAL HIGH (ref 9.2–12.3)

## 2019-12-11 MED ORDER — AMOXICILLIN 500 MG-POTASSIUM CLAVULANATE 125 MG TABLET
ORAL_TABLET | ORAL | 0 refills | Status: AC
Start: 2019-12-11 — End: ?

## 2019-12-11 NOTE — Nursing Note (Signed)
BP (!) 140/80   Pulse (!) 103   Temp 36.2 C (97.1 F) (Thermal Scan)   Ht 1.918 m (6' 3.5")   Wt 126 kg (277 lb 3.2 oz)   SpO2 100%   BMI 34.19 kg/m     Gregary Cromer, MA  12/11/2019, 08:36

## 2019-12-11 NOTE — Nursing Note (Signed)
12/11/19 0837   Depression Screen   Little interest or pleasure in doing things. 0   Feeling down, depressed, or hopeless 0   PHQ 2 Total 0

## 2019-12-11 NOTE — Nursing Note (Signed)
12/11/19 0836   Fall Risk Assessment   Do you feel unsteady when standing or walking? No   Do you worry about falling? No   Have you fallen in the past year? Yes   How many times have you fallen? Once   Were you ever injured from falling? No   Timed up and go test (in seconds) 9

## 2019-12-11 NOTE — Progress Notes (Signed)
[facility]   [address]   [place], [place] [address]     [phone]  [phone] (fax)      [place]  [address]  [place] [address]    AcuteVisit    Name: Kenneth Weeks MRN:  [id number]   Date: 12/11/2019 Age: 69 y.o.       SUBJECTIVE:   Kenneth Weeks is a 69 y.o. male for       CHIEF COMPLAINT   Right foot injury     Onset --   "a couple of weeks ago"     Description -- "a blister, I think"     Then what happened -- the blister got worse after that     + pain   + swelling   He has been cleaning it and got out an old walking boot   Some redness   ? Warmth with it   + oozing   ? Injury -- he cannot remember     Healthcare Since I Saw Him Last   ED -- no visits recently   Urgent care -- no visits recently   New medications -- nothing   Surgeries or procedures -- nothing  Accidents in a car/truck -- n    Chewing/swallowing -- no choking   Liquids --    Solids --     Bowels -- no constipation     Morning lasix dose: daily around 6AM      Medications reviewed by me personally:   Warfarin 7.5mg  daily in the AM   Synthroid 269mcg daily in the AM   Toprol XL 200 mg daily   Lasix 40 mg daily in the AM   Lisinopril 5 mg daily     Colace daily   Potassium daily -- OTC supplement   Magnesium tablet   Vitamin D3   Calcium OTC     Social History Update   Same house  Wife is well   No travel   Active around the house   Working from home -- doing well with this     Family History Update   Children -- alive and well: son with a TBI      I have reviewed and updated as appropriate the past medical, family and social history. 12/11/2019 as summarized below:  Past Medical History:   Diagnosis Date   . A-fib (CMS HCC)     Chronic anti-coagulant therapy; Hx cardioversions   . Abdominal hernia     Ventral hernia; Left Inguinal hernia   . Acute on chronic renal failure (CMS HCC)    . Biliary and gallbladder disorder     "Gallstones"   . Blood  thinned due to long-term anticoagulant use    . Bruises easily    . Cancer (CMS HCC) 04/09/2017    Colon grade 0 surgery   . Congestive heart failure (CMS HCC) 04/09/2017    CHF 01/2017    . Diabetes mellitus (CMS [place])     type ii diet controlled   . Disorder of liver 04/09/2017    fatty liver   . Dysrhythmias     a fib   . Ejection fraction < 50%    . Heart murmur    . Hemorrhage of gastrointestinal tract 5/16   . Hemorrhagic shock (CMS HCC)    . Hepatic cirrhosis (CMS HCC) 58/52/7782    non alcoholic   . Hx of transfusion 04/09/2017    no  reaction   . Hydrocele    . Hypertension 07/10/2014   . Hypothyroid    . Marfan syndrome    . Neck problem 04/09/2017    LROM   . Non-healing non-surgical wound    . Obesity    . Palpitations    . Peripheral vascular disease (CMS [place]) 04/09/2017    no recent problems since starting lasix in 01/2017   . Syncope    . Thyroid disease     hypothyroid   . Transfusion history     s/p Multiple units PRC/FFP this adm.   . Type 2 diabetes mellitus (CMS [place]) 04/09/2017    diet controlled, HA1C 6.0   . Valvular disease 04/09/2017    [place] aortic valve replacement-chronic anti-coagulant therapy   . Wears glasses      Past Surgical History:   Procedure Laterality Date   . Colonoscopy     . Hx colonoscopy     . Hx heart catheterization     . Hx heart valve surgery  1999   . Hx hemicolectomy Right 07/13/2014   . Hx tonsil and adenoidectomy     . Hx tonsillectomy     . Hx upper endoscopy  07/09/14   . Hx wisdom teeth extraction       Current Outpatient Medications   Medication Sig   . cholecalciferol, vitamin D3, 1,000 unit Oral Tablet Take 1,000 Units by mouth Once a day   . docusate sodium (COLACE) 100 mg Oral Capsule Take 1 Cap (100 mg total) by mouth Twice daily   . furosemide (LASIX) 40 mg Oral Tablet Take 0.5 Tabs (20 mg total) by mouth Once a day 1 TWICE PER DAY FOR SWELLING   . levothyroxine (EUTHYROX) 200 mcg Oral Tablet Take 1 tablet by mouth once daily   . lisinopriL (PRINIVIL) 20 mg  Oral Tablet Take 1 Tablet (20 mg total) by mouth Once a day   . MAGNESIUM CARBONATE ORAL Take by mouth Once a day   . Metoprolol Succinate (TOPROL-XL) 200 mg Oral Tablet Sustained Release 24 hr TAKE ONE TABLET BY MOUTH EVERY DAY   . vitamin B complex Oral Tablet Take 1 Tab by mouth Once a day   . warfarin (JANTOVEN) 5 mg Oral Tablet TAKE 2 TABLETS BY MOUTH ON MONDAYS AND FRIDAYS AND TAKE 1.5 TABLETS BY MOUTH ALL OTHER DAYS OR AS DIRECTED TO     Family Medical History:     Problem Relation (Age of Onset)    Cirrhosis Brother    Hypertension (High Blood Pressure) Mother    Lung Cancer Father            Social History     Socioeconomic History   . Marital status: Married     Spouse name: Not on file   . Number of children: Not on file   . Years of education: Not on file   . Highest education level: Not on file   Tobacco Use   . Smoking status: Never Smoker   . Smokeless tobacco: Never Used   Substance and Sexual Activity   . Alcohol use: No     Alcohol/week: 0.0 standard drinks   . Drug use: No   Other Topics Concern   . Ability to Walk 1 Flight of Steps without SOB/CP Yes   . Ability to Walk 2 Flight of Steps without SOB/CP Yes   . Ability To Do Own ADL's Yes   . Other Activity Level Yes  Comment: walks a lot a work, up and down steps several times a day     Social Determinants of Company secretary Strain:    . Difficulty of Paying Living Expenses:    Food Insecurity:    . Worried About Charity fundraiser in the Last Year:    . Arboriculturist in the Last Year:    Transportation Needs:    . Film/video editor (Medical):    Marland Kitchen Lack of Transportation (Non-Medical):    Physical Activity:    . Days of Exercise per Week:    . Minutes of Exercise per Session:    Stress:    . Feeling of Stress :    Intimate Partner Violence:    . Fear of Current or Ex-Partner:    . Emotionally Abused:    Marland Kitchen Physically Abused:    . Sexually Abused:      Review of Systems: A comprehensive review of systems was negative.      List of Current Health Care Providers   Care Team     PCP     Name Type Specialty Phone Number    Viona Gilmore, MD Physician [facility] [phone]          Care Team     Name Type Specialty Phone Number    Jill Alexanders, RN Registered Nurse MEDICAL ONCOLOGY Not available                  Health Maintenance   Topic Date Due   . Pneumococcal 65+ Years Low Risk (1 of 2 - PPSV23) Never done   . Adult Tdap-Td (1 - Tdap) Never done   . Shingles Vaccine (1 of 2) Never done   . Depression Screening  03/09/2019   . Annual Wellness Visit  03/09/2019   . Influenza Vaccine (1) Never done   . Colonoscopy  01/27/2026   . Hepatitis C screening  Completed   . Covid-19 Vaccine  Completed     Medicare Wellness Assessment      Advance Directives (optional)                  Activities of Daily Living                   Urinary Incontinence Screen (Women >=65 only)       Cognitive Function Screen (1=Yes, 0=No)                                                   Hearing Screen       Fall Risk Screen       Vision Screen           Depression Screen            OBJECTIVE:   There were no vitals taken for this visit.    General -- alert    HEENT -- PERRL, EOMI   Mask  Heart --mechanical click heard clealry   Neck -- Thyroid is normal sized  Abdomen --  Soft and non-tender   Neuro -- normal gait and tone; strength is 5/; gait is easy and strong   Psych -- pleasant    Left foot -- blister at the base of the left great toe: redness along  first 3 toes; I expressed some drainage from the base of the toes; red skin with some crusting on the skin       Labs -- reviewed by me in the EMR  February 2020   LFT's okay    Creatinine 1.0, GFR > [place] note -- colon cancer: doing well. August 2019 CT scan negative    December 2019   Creatinine 1.2   Glucose 129   LFT's okay     November 2019                 Dr. Tish Men note -- start Delene Loll; stop the lisinopril; cut lasix from 40 mg to 20 mg daily     August  2019                 WBC 4.2, Hgb 14, platelets 125                Creatinine 1.2                LFT's okay    CT chest abdomen and pelvis -- resolution of hematoma, post-operative changes right hemi-colectomy    May 2019                 WBC 4.8, Hgb 11.9 (MCV 89); platelets 222                 Creatinine 0.98                Glucose 141                 LFT's okay                   Wound clinic -- released from their care                 Dr. Tish Men note -- a fib: continue coumadin/Toprol; cardiomyopathy: increase Toprol; lisinopril, Eplerenone     March 2019                 WBC 10.5                Hgb 8.3                Platelets 238                 Creatinine 0.93                 Glucose 147     January 2019                 Dr. Tish Men -- Cardiomyopathy/diastolic CHF     November 2018                 Creatinine 1.22                LFT's okay     May 2018  WBC 5.9  Hgb 15.7  Platelets 127   Creatinine 1.02  A1c 5.7%   CEA 1.17 January 2016  Colonic biopsy -- hyperplastic polyp only     September 2017   Creatinine 1.21   Hgb 15.18 August 2015  Hgb 15.7  Creatinine 1.14  CT scan of chest -- minimal lung nodules, not changed; not big     March 2017  INR 2.17 January 2015  Dr. Burke Keels note: no evidence of any  metastatic disease   Dr. Dianah Field note: pT2N0 stage I colon cancer   Hgb 11.8   Creatinine 1.04   Glucose 99  LFT's okay  CEA 1.5  CT chest/abdomen/pelvis -- no evidence of recurrent disease; some small lung nodules     June 2016  [place] Note:   Follow up colonoscopy in December 2016   CT scan of chest/abdomen/pelvis in summer  2017     May 2016  Creatinine 1.27  Glucose 136 (not fasting)   LFT's okay     March 2016   Creatinine 1.02  TSH 1.23  LFT's okay       Health Maintenance Due   Topic Date Due   . Pneumococcal 65+ Years Low Risk (1 of 2 - PPSV23) Never done   . Adult Tdap-Td (1 - Tdap) Never done   . Shingles Vaccine (1 of 2) Never done   . Depression Screening  03/09/2019   . Annual Wellness Visit  03/09/2019   . 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- needs to get back on Toprol --> I will Rx now for him   Euvolemic     Diabetes-- doing fantastic without any medication     S/P laporoscopic right hemicolectomy with stapled anastomosis for stage II colon cancer-- He will likely not need another scope until 2020   Visit with cancer doctors August 2020     Hypothyroidism-- on replacement medication   Continue     Obesity-- has fallen off the wagon     Vision-- glasses; no changes     Hearing issues-- not limited     Exercise-- He is walking on a regular basis    Need for HIV or hepatitis C screening -- no reason to screen     Tobacco abuse screening-- none at all     Alcohol and drug abuse screening-- none at all     Depression/anxiety screening-- he denies any at this time     Immunizations-- I recommend a yearly flu shot: he got in 2019        DISPOSITION:  Rx Augmentin 500 mg BID for 7 days --> [facility]   Check INR at the end of the antibiotics     Follow up PRN   No caustic substances to the skin              The patient has been educated about risk factors and recommended preventive care. Written Prevention Plan completed/ updated and given to patient (see After Visit Summary).    No  follow-ups on file.    Viona Gilmore, MD  [facility] INTERNAL MEDICINE  INTERNAL MEDICINE, [facility]  [place]  [place] [id number]  [phone]

## 2019-12-14 ENCOUNTER — Other Ambulatory Visit: Payer: Self-pay

## 2019-12-14 ENCOUNTER — Ambulatory Visit (INDEPENDENT_AMBULATORY_CARE_PROVIDER_SITE_OTHER): Payer: Medicare Other

## 2019-12-14 DIAGNOSIS — Z23 Encounter for immunization: Secondary | ICD-10-CM

## 2019-12-14 NOTE — Patient Instructions (Signed)
1 Revised: 08 December 2019        VACCINE INFORMATION FACT SHEET FOR RECIPIENTS AND CAREGIVERS   ABOUT COMIRNATY (COVID-19 VACCINE, mRNA)    AND THE PFIZER-BIONTECH COVID-19 VACCINE TO PREVENT CORONAVIRUS   DISEASE 2019 (COVID-19) FOR USE IN INDIVIDUALS 71 YEARS OF AGE AND   OLDER         FOR 73 YEARS OF AGE AND OLDER            You are being offered either COMIRNATY (COVID-19 Vaccine, mRNA) or the Pfizer-BioNTech COVID-19 Vaccine to prevent Coronavirus Disease 2019 (COVID-19) caused by SARS-CoV-2.       This Vaccine Information Fact Sheet for Recipients and Caregivers comprises the Fact Sheet for the authorized Pfizer-BioNTech COVID-19 Vaccine and also includes information about the FDA-licensed vaccine, COMIRNATY (COVID-19 Vaccine, mRNA) for use in individuals 49 years of age and older.       The FDA-approved COMIRNATY (COVID-19 Vaccine, mRNA) and the two formulations of Pfizer-BioNTech COVID-19 Vaccine authorized for Emergency Use Authorization (EUA) for ages 59 years and older, when prepared according to their respective instructions for use, can be used interchangeably.1      COMIRNATY (COVID-19 Vaccine, mRNA) is an FDA-approved COVID-19 vaccine made by [employer] for [employer]. It is approved as a 2-dose series for prevention of COVID-19 in individuals 41 years of age and older. It is also authorized under EUA to provide:   . a 2-dose primary series to individuals 12 through 15 years;    . a third primary series dose to individuals 77 years of age and older who have been determined to have certain kinds of immunocompromise; and    . a single booster dose to the following individuals who have completed a primary series with Pfizer-BioNTech COVID-19 Vaccine or COMIRNATY:   o  1 years of age and older    o  61 through 71 years of age at high risk of severe COVID-19      1 When prepared according to their respective instructions for use, the FDA-approved COMIRNATY   (COVID-19 Vaccine, mRNA) and the two  EUA-authorized formulations of Pfizer-BioNTech COVID-19 Vaccine for ages 44 years of age and older can be used interchangeably without presenting any safety or effectiveness concerns.                   o  58 through 71 years of age with frequent institutional or occupational exposure to SARS-CoV-2    . a single booster dose to eligible individuals who have completed primary vaccination with a different authorized COVID-19 vaccine. Booster eligibility and schedule are based on the labeling information of the vaccine used for the primary series.       The Pfizer-BioNTech COVID-19 Vaccine has received EUA from FDA to provide:   . a 2-dose primary series to individuals 3 years of age and older;    . a third primary series dose to individuals 50 years of age and older who have been determined to have certain kinds of immunocompromise; and    . a single booster dose to the following individuals who have completed a primary series with Pfizer-BioNTech COVID-19 Vaccine or COMIRNATY:   o 37 years of age and older o 72 through 71 years of age at high risk of severe COVID-19 o 53 through 71 years of age with frequent institutional or occupational exposure to SARS-CoV-2    . a single booster dose to eligible individuals who have completed primary vaccination with a different  authorized COVID-19 vaccine. Booster eligibility and schedule are based on the labeling information of the vaccine used for the primary series.        This Vaccine Information Fact Sheet contains information to help you understand the risks and benefits of COMIRNATY (COVID-19 Vaccine, mRNA) and the   Pfizer-BioNTech COVID-19 Vaccine, which you may receive because there is currently a pandemic of COVID-19. Talk to your vaccination provider if you have questions.      This Fact Sheet may have been updated. For the most recent Fact Sheet, please see www.[email or web address].      WHAT YOU NEED TO KNOW BEFORE YOU GET THIS VACCINE      WHAT IS COVID-19?   COVID-19  disease is caused by a coronavirus called SARS-CoV-2. You can get   COVID-19 through contact with another person who has the virus. It is predominantly a respiratory illness that can affect other organs. People with COVID-19 have had a wide range of symptoms reported, ranging from mild symptoms to severe illness leading to death. Symptoms may appear 2 to 14 days after exposure to the virus. Symptoms may include: fever or chills; cough; shortness of breath; fatigue; muscle or body aches; headache; new loss of taste or smell; sore throat; congestion or runny nose; nausea or vomiting; diarrhea.      WHAT IS COMIRNATY (COVID-19 VACCINE, mRNA) AND HOW IS IT RELATED TO THE PFIZER-BIONTECH COVID-19 VACCINE?      COMIRNATY (COVID-19 Vaccine, mRNA) and the Pfizer-BioNTech COVID-19 Vaccine when prepared according to their respective instructions for use, can be used interchangeably.      For more information on EUA, see the "What is an Emergency Use Authorization (EUA)?" section at the end of this Fact Sheet.       WHAT SHOULD YOU MENTION TO YOUR VACCINATION PROVIDER BEFORE YOU GET THE VACCINE?   Tell the vaccination provider about all of your medical conditions, including if you:   . have any allergies    . have had myocarditis (inflammation of the heart muscle) or pericarditis   (inflammation of the lining outside the heart)   . have a fever   . have a bleeding disorder or are on a blood thinner   . are immunocompromised or are on a medicine that affects your immune system   . are pregnant or plan to become pregnant   . are breastfeeding   . have received another COVID-19 vaccine   . have ever fainted in association with an injection      HOW IS THE VACCINE GIVEN?   The Pfizer-BioNTech COVID-19 Vaccine or COMIRNATY will be given to you as an injection into the muscle.      Primary Series: The vaccine is administered as a 2-dose series, 3 weeks apart. A third primary series dose may be administered at least 4 weeks after the  second dose to individuals who are determined to have certain kinds of immunocompromise.      Booster Dose:    . A single booster dose of the vaccine may be administered at least 6 months after completion of a primary series to individuals:   o 93 years of age and older   o 15 through 71 years of age at high risk of severe COVID-19 o 6 through 71 years of age with frequent institutional or occupational exposure to SARS-CoV-2    . A single booster dose of the vaccine may be administered to certain individuals who have completed primary vaccination with a  different authorized COVID-19 vaccine. Please check with your healthcare provider regarding eligibility for and timing of the booster dose.      The vaccine may not protect everyone.      WHO SHOULD NOT GET THE VACCINE?   You should not get the vaccine if you:   . had a severe allergic reaction after a previous dose of this vaccine   . had a severe allergic reaction to any ingredient of this vaccine.      WHAT ARE THE INGREDIENTS IN THE VACCINES?   COMIRNATY (COVID-19 Vaccine, mRNA) and the authorized formulations of the vaccine include the following ingredients:    . mRNA and lipids ((4-hydroxybutyl)azanediyl)bis(hexane-6,1-diyl)bis(2hexyldecanoate), 2 [(polyethylene glycol)-2000]-N,N-ditetradecylacetamide, 1,2Distearoyl-sn-glycero-3-phosphocholine, and cholesterol).      Pfizer-BioNTech COVID-19 vaccines for individuals 105 years of age and older contain 1 of the following sets of additional ingredients; ask the vaccination provider which version is being administered:   . potassium chloride, monobasic potassium phosphate, sodium chloride, dibasic sodium phosphate dihydrate, and sucrose   OR   . tromethamine, tromethamine hydrochloride, and sucrose      COMIRNATY (COVID-19 Vaccine, mRNA) contains the following additional ingredients: potassium chloride, monobasic potassium phosphate, sodium chloride, dibasic sodium phosphate dihydrate, and sucrose.      HAS THE  VACCINE BEEN USED BEFORE?   Yes. In clinical trials, approximately 23,000 individuals 86 years of age and older have received at least 1 dose of the vaccine. Data from these clinical trials supported the Emergency Use Authorization of the Pfizer-BioNTech COVID-19 Vaccines and the approval of COMIRNATY (COVID-19 Vaccine, mRNA). Millions of individuals have received the vaccine under EUA since January 20, 2019. The vaccine that is authorized for use in individuals 12 years and older includes two formulations; one that was studied in clinical trials and used under EUA, and one with the same mRNA and lipids but different inactive ingredients. The use of the different inactive ingredients helps stabilize the vaccine under refrigerated temperatures and the formulation can be administered without dilution.      WHAT ARE THE BENEFITS OF THE VACCINE?   The vaccine has been shown to prevent COVID-19.       The duration of protection against COVID-19 is currently unknown.      WHAT ARE THE RISKS OF THE VACCINE?   There is a remote chance that the vaccine could cause a severe allergic reaction. A severe allergic reaction would usually occur within a few minutes to 1 hour after getting a dose of the vaccine. For this reason, your vaccination provider may ask you to stay at the place where you received your vaccine for monitoring after vaccination. Signs of a severe allergic reaction can include:   Marland Kitchen Difficulty breathing   . Swelling of your face and throat   . A fast heartbeat   . A bad rash all over your body   . Dizziness and weakness      Myocarditis (inflammation of the heart muscle) and pericarditis (inflammation of the lining outside the heart) have occurred in some people who have received the vaccine. In most of these people, symptoms began within a few days following receipt of the second dose of vaccine. The chance of having this occur is very low. You should seek medical attention right away if you have any of the  following symptoms after receiving the vaccine:    . Chest pain   . Shortness of breath   . Feelings of having a fast-beating, fluttering, or pounding heart  Side effects that have been reported with the vaccine include:    . severe allergic reactions   . non-severe allergic reactions such as rash, itching, hives, or swelling of the face   . myocarditis (inflammation of the heart muscle)   . pericarditis (inflammation of the lining outside the heart)   . injection site pain   . tiredness   . headache   . muscle pain   . chills   . joint pain   . fever   . injection site swelling   . injection site redness   . nausea   . feeling unwell   . swollen lymph nodes (lymphadenopathy)   . decreased appetite   . diarrhea   . vomiting   . arm pain   . fainting in association with injection of the vaccine      These may not be all the possible side effects of the vaccine. Serious and unexpected side effects may occur. The possible side effects of the vaccine are still being studied in clinical trials.      WHAT SHOULD I DO ABOUT SIDE EFFECTS?   If you experience a severe allergic reaction, call 9-1-1, or go to the nearest hospital.      Call the vaccination provider or your healthcare provider if you have any side effects that bother you or do not go away.      Report vaccine side effects to FDA/CDC Vaccine Adverse Event Reporting System (VAERS). The VAERS toll-free number is 340-771-6212 or report online to [email or web address].https://www.washington.net/. Please include either "COMIRNATY (COVID-19 Vaccine, mRNA)" or "Pfizer-BioNTech COVID-19 Vaccine EUA", as appropriate, in the first line of box #18 of the report form.      In addition, you can report side effects to [employer] at the contact information provided below.      Website  Fax number  Telephone number    www.pfizersafetyreporting.com  [phone]  [phone]       You may also be given an option to enroll in v-safe. V-safe is a new voluntary smartphone-based  tool that uses text messaging and web surveys to check in with people who have been vaccinated to identify potential side effects after COVID-19 vaccination. V-safe asks questions that help CDC monitor the safety of COVID-19 vaccines. V-safe also provides second-dose reminders if needed and live telephone follow-up by CDC if participants report a significant health impact following COVID-19 vaccination. For more information on how to sign up, visit: [email or web address].      WHAT IF I DECIDE NOT TO GET COMIRNATY (COVID-19 VACCINE, mRNA) OR THE PFIZER-BIONTECH COVID-19 VACCINE?    Under the EUA, it is your choice to receive or not receive the vaccine. Should you decide not to receive it, it will not change your standard medical care.      ARE OTHER CHOICES AVAILABLE FOR PREVENTING COVID-19 BESIDES COMIRNATY (COVID-19 VACCINE, mRNA) OR THE PFIZER-BIONTECH COVID-19 VACCINE?   Other vaccines to prevent COVID-19 may be available under Emergency Use Authorization.       CAN I RECEIVE THE COMIRNATY (COVID-19 VACCINE, mRNA) OR PFIZER-BIONTECH COVID-19 VACCINE AT THE SAME TIME AS OTHER VACCINES?   Data have not yet been submitted to FDA on administration of COMIRNATY (COVID-19 Vaccine, mRNA) or the Pfizer-BioNTech COVID-19 Vaccine at the same time with other vaccines. If you are considering receiving COMIRNATY (COVID-19 Vaccine, mRNA) or the Pfizer-BioNTech COVID-19 Vaccine with other vaccines, discuss your options with your healthcare provider.      WHAT IF I AM  IMMUNOCOMPROMISED?   If you are immunocompromised, you may receive a third dose of the vaccine. The third dose may still not provide full immunity to COVID-19 in people who are immunocompromised, and you should continue to maintain physical precautions to help prevent COVID-19. In addition, your close contacts should be vaccinated as appropriate.      WHAT IF I AM PREGNANT OR BREASTFEEDING?   If you are pregnant or breastfeeding, discuss your options with your  healthcare provider.      WILL THE VACCINE GIVE ME COVID-19?   No. The vaccine does not contain SARS-CoV-2 and cannot give you COVID-19.      KEEP YOUR VACCINATION CARD   When you get your first dose, you will get a vaccination card to show you when to return for your next dose(s) of the vaccine. Remember to bring your card when you return.      ADDITIONAL INFORMATION    If you have questions, visit the website or call the telephone number provided below.       To access the most recent Fact Sheets, please scan the QR code provided below.      Global website  Telephone number    [email or web address].com        [phone]   (1-877-VAX-CO19)          HOW CAN I LEARN MORE?   Marland Kitchen Ask the vaccination provider.   . Visit CDC at [email or web address].be.   . Visit FDA at [email or web address].hu.   Minette Brine your local or state public health department.      WHERE WILL MY VACCINATION INFORMATION BE RECORDED?    The vaccination provider may include your vaccination information in your state/local jurisdiction's Immunization Information System (IIS) or other designated system. This will ensure that you receive the same vaccine when you return for the second dose. For more information about IISs visit: [email or web address].      CAN I BE CHARGED AN ADMINISTRATION FEE FOR RECEIPT OF THE COVID-19 VACCINE?   No. At this time, the provider cannot charge you for a vaccine dose and you cannot be charged an out-of-pocket vaccine administration fee or any other fee if only receiving a COVID-19 vaccination. However, vaccination providers may seek appropriate reimbursement from a program or plan that covers COVID-19 vaccine administration fees for the vaccine recipient (private insurance, Medicare, Medicaid, [place] [HRSA] COVID-19 Uninsured  Program for noninsured recipients).      WHERE CAN I REPORT CASES OF SUSPECTED FRAUD?   Individuals becoming aware of any potential violations of the CDC COVID-19   Vaccination Program requirements are encouraged to report them to the Office of the [employer], U.S. Department of Health and [employer], at 1-800-HHS-TIPS or https://TIPS.HHS.GOV.      WHAT IS THE COUNTERMEASURES INJURY COMPENSATION PROGRAM?   The Countermeasures Injury Compensation Program (CICP) is a federal program that may help pay for costs of medical care and other specific expenses of certain people who have been seriously injured by certain medicines or vaccines, including this vaccine. Generally, a claim must be submitted to the CICP within one (1) year from the date of receiving the vaccine. To learn more about this program, visit https://www.bennett.info/ or call (845)574-4519.       WHAT IS AN EMERGENCY USE AUTHORIZATION (EUA)?   An Emergency Use Authorization (EUA) is a mechanism to facilitate the availability and use of medical products, including vaccines, during public health emergencies, such as the current COVID-19 pandemic. An EUA  is supported by a Risk manager (HHS) declaration that circumstances exist to justify the emergency use of drugs and biological products during the COVID-19 pandemic.       The FDA may issue an EUA when certain criteria are met, which includes that there are no adequate, approved, available alternatives. In addition, the FDA decision is based on the totality of scientific evidence available showing that the product may be effective to prevent COVID-19 during the COVID-19 pandemic and that the known and potential benefits of the product outweigh the known and potential risks of the product. All of these criteria must be met to allow for the product to be used in the treatment of patients during the COVID-19 pandemic.      This EUA for the Pfizer-BioNTech COVID-19 Vaccine and  COMIRNATY will end when the Secretary of HHS determines that the circumstances justifying the EUA no longer exist or when there is a change in the approval status of the product such that an EUA is no longer needed.               Manufactured by   [employer] [place] [address]          Manufactured for   [address]   [place], [place]      [id number]      Revised: 08 December 2019

## 2019-12-14 NOTE — Nursing Note (Signed)
PT came in for COVID vaccine pt tolerated admin well pt waited ten min MA checked on PT and gave the ok to leave  Barkley Bruns, MA  12/14/2019, 11:27

## 2020-01-10 ENCOUNTER — Other Ambulatory Visit: Payer: Self-pay

## 2020-01-10 ENCOUNTER — Ambulatory Visit: Payer: Medicare Other | Attending: INTERNAL MEDICINE

## 2020-01-10 DIAGNOSIS — Z7901 Long term (current) use of anticoagulants: Secondary | ICD-10-CM | POA: Insufficient documentation

## 2020-01-10 LAB — PT/INR
INR: 4.06
PROTHROMBIN TIME: 44.5 seconds — ABNORMAL HIGH (ref 9.2–12.3)

## 2020-01-19 ENCOUNTER — Other Ambulatory Visit (INDEPENDENT_AMBULATORY_CARE_PROVIDER_SITE_OTHER): Payer: Self-pay | Admitting: INTERNAL MEDICINE

## 2020-01-19 DIAGNOSIS — I350 Nonrheumatic aortic (valve) stenosis: Secondary | ICD-10-CM

## 2020-01-19 DIAGNOSIS — I4891 Unspecified atrial fibrillation: Secondary | ICD-10-CM

## 2020-01-19 DIAGNOSIS — I429 Cardiomyopathy, unspecified: Secondary | ICD-10-CM

## 2020-01-19 DIAGNOSIS — I10 Essential (primary) hypertension: Secondary | ICD-10-CM

## 2020-01-19 MED ORDER — LISINOPRIL 20 MG TABLET
20.0000 mg | ORAL_TABLET | Freq: Every day | ORAL | 1 refills | Status: DC
Start: 2020-01-19 — End: 2020-01-22

## 2020-01-19 NOTE — Telephone Encounter (Signed)
Pt has tried getting a refill from Dr. Tish Men for his Lisinopril with out success.  Pt is asking if you could refill his lisinopril until he has able to get a refill from Dr. Tish Men?    Gregary Cromer, MA  01/19/2020, 13:04

## 2020-01-22 ENCOUNTER — Other Ambulatory Visit (INDEPENDENT_AMBULATORY_CARE_PROVIDER_SITE_OTHER): Payer: Self-pay | Admitting: Cardiovascular Disease

## 2020-01-22 ENCOUNTER — Encounter (INDEPENDENT_AMBULATORY_CARE_PROVIDER_SITE_OTHER): Payer: Self-pay

## 2020-01-22 DIAGNOSIS — I429 Cardiomyopathy, unspecified: Secondary | ICD-10-CM

## 2020-01-22 DIAGNOSIS — I10 Essential (primary) hypertension: Secondary | ICD-10-CM

## 2020-01-22 DIAGNOSIS — I350 Nonrheumatic aortic (valve) stenosis: Secondary | ICD-10-CM

## 2020-01-22 DIAGNOSIS — I4891 Unspecified atrial fibrillation: Secondary | ICD-10-CM

## 2020-01-22 MED ORDER — LISINOPRIL 20 MG TABLET
20.0000 mg | ORAL_TABLET | Freq: Every day | ORAL | 0 refills | Status: AC
Start: 2020-01-22 — End: ?

## 2020-02-12 ENCOUNTER — Other Ambulatory Visit (INDEPENDENT_AMBULATORY_CARE_PROVIDER_SITE_OTHER): Payer: Self-pay

## 2020-03-03 ENCOUNTER — Other Ambulatory Visit: Payer: Self-pay

## 2020-09-09 ENCOUNTER — Other Ambulatory Visit (INDEPENDENT_AMBULATORY_CARE_PROVIDER_SITE_OTHER): Payer: Self-pay | Admitting: INTERNAL MEDICINE

## 2020-10-20 ENCOUNTER — Emergency Department (HOSPITAL_COMMUNITY): Payer: Medicare Other

## 2020-10-20 ENCOUNTER — Other Ambulatory Visit: Payer: Self-pay

## 2020-10-20 ENCOUNTER — Inpatient Hospital Stay (HOSPITAL_COMMUNITY): Payer: Medicare Other

## 2020-10-20 ENCOUNTER — Inpatient Hospital Stay (HOSPITAL_COMMUNITY)
Admission: EM | Admit: 2020-10-20 | Discharge: 2020-10-23 | DRG: 871 | Disposition: A | Discharge status: Home / Self Care | Payer: Medicare Other | Attending: Student in an Organized Health Care Education/Training Program | Admitting: Student in an Organized Health Care Education/Training Program

## 2020-10-20 DIAGNOSIS — K7460 Unspecified cirrhosis of liver: Secondary | ICD-10-CM | POA: Diagnosis present

## 2020-10-20 DIAGNOSIS — Z79890 Hormone replacement therapy (postmenopausal): Secondary | ICD-10-CM

## 2020-10-20 DIAGNOSIS — I872 Venous insufficiency (chronic) (peripheral): Secondary | ICD-10-CM | POA: Diagnosis present

## 2020-10-20 DIAGNOSIS — N400 Enlarged prostate without lower urinary tract symptoms: Secondary | ICD-10-CM | POA: Diagnosis present

## 2020-10-20 DIAGNOSIS — I5022 Chronic systolic (congestive) heart failure: Secondary | ICD-10-CM | POA: Diagnosis present

## 2020-10-20 DIAGNOSIS — I110 Hypertensive heart disease with heart failure: Secondary | ICD-10-CM | POA: Diagnosis present

## 2020-10-20 DIAGNOSIS — R4182 Altered mental status, unspecified: Secondary | ICD-10-CM | POA: Diagnosis present

## 2020-10-20 DIAGNOSIS — E039 Hypothyroidism, unspecified: Secondary | ICD-10-CM | POA: Diagnosis present

## 2020-10-20 DIAGNOSIS — R509 Fever, unspecified: Secondary | ICD-10-CM | POA: Diagnosis present

## 2020-10-20 DIAGNOSIS — Z7901 Long term (current) use of anticoagulants: Secondary | ICD-10-CM | POA: Diagnosis not present

## 2020-10-20 DIAGNOSIS — I77810 Thoracic aortic ectasia: Secondary | ICD-10-CM | POA: Diagnosis present

## 2020-10-20 DIAGNOSIS — I482 Chronic atrial fibrillation, unspecified: Secondary | ICD-10-CM | POA: Diagnosis present

## 2020-10-20 DIAGNOSIS — E861 Hypovolemia: Secondary | ICD-10-CM | POA: Diagnosis present

## 2020-10-20 DIAGNOSIS — I4891 Unspecified atrial fibrillation: Secondary | ICD-10-CM

## 2020-10-20 DIAGNOSIS — E11621 Type 2 diabetes mellitus with foot ulcer: Secondary | ICD-10-CM | POA: Diagnosis present

## 2020-10-20 DIAGNOSIS — L03116 Cellulitis of left lower limb: Secondary | ICD-10-CM | POA: Diagnosis present

## 2020-10-20 DIAGNOSIS — Z820 Family history of epilepsy and other diseases of the nervous system: Secondary | ICD-10-CM

## 2020-10-20 DIAGNOSIS — Z79899 Other long term (current) drug therapy: Secondary | ICD-10-CM

## 2020-10-20 DIAGNOSIS — D849 Immunodeficiency, unspecified: Secondary | ICD-10-CM | POA: Diagnosis present

## 2020-10-20 DIAGNOSIS — Z952 Presence of prosthetic heart valve: Secondary | ICD-10-CM

## 2020-10-20 DIAGNOSIS — L97519 Non-pressure chronic ulcer of other part of right foot with unspecified severity: Secondary | ICD-10-CM | POA: Diagnosis present

## 2020-10-20 DIAGNOSIS — K7581 Nonalcoholic steatohepatitis (NASH): Secondary | ICD-10-CM | POA: Diagnosis present

## 2020-10-20 DIAGNOSIS — B9689 Other specified bacterial agents as the cause of diseases classified elsewhere: Secondary | ICD-10-CM | POA: Diagnosis present

## 2020-10-20 DIAGNOSIS — I959 Hypotension, unspecified: Secondary | ICD-10-CM | POA: Diagnosis present

## 2020-10-20 DIAGNOSIS — L03115 Cellulitis of right lower limb: Secondary | ICD-10-CM | POA: Diagnosis present

## 2020-10-20 DIAGNOSIS — I5043 Acute on chronic combined systolic (congestive) and diastolic (congestive) heart failure: Secondary | ICD-10-CM | POA: Diagnosis not present

## 2020-10-20 DIAGNOSIS — I429 Cardiomyopathy, unspecified: Secondary | ICD-10-CM | POA: Diagnosis present

## 2020-10-20 DIAGNOSIS — Q8740 Marfan's syndrome, unspecified: Secondary | ICD-10-CM | POA: Diagnosis not present

## 2020-10-20 DIAGNOSIS — A419 Sepsis, unspecified organism: Secondary | ICD-10-CM

## 2020-10-20 DIAGNOSIS — Z801 Family history of malignant neoplasm of trachea, bronchus and lung: Secondary | ICD-10-CM

## 2020-10-20 DIAGNOSIS — Z20822 Contact with and (suspected) exposure to covid-19: Secondary | ICD-10-CM | POA: Diagnosis present

## 2020-10-20 DIAGNOSIS — Z85038 Personal history of other malignant neoplasm of large intestine: Secondary | ICD-10-CM

## 2020-10-20 DIAGNOSIS — Z8774 Personal history of (corrected) congenital malformations of heart and circulatory system: Secondary | ICD-10-CM

## 2020-10-20 DIAGNOSIS — R7989 Other specified abnormal findings of blood chemistry: Secondary | ICD-10-CM | POA: Diagnosis present

## 2020-10-20 DIAGNOSIS — Z9049 Acquired absence of other specified parts of digestive tract: Secondary | ICD-10-CM

## 2020-10-20 DIAGNOSIS — R7881 Bacteremia: Secondary | ICD-10-CM | POA: Diagnosis not present

## 2020-10-20 DIAGNOSIS — I878 Other specified disorders of veins: Secondary | ICD-10-CM | POA: Diagnosis present

## 2020-10-20 DIAGNOSIS — Z888 Allergy status to other drugs, medicaments and biological substances status: Secondary | ICD-10-CM | POA: Diagnosis not present

## 2020-10-20 DIAGNOSIS — Z86010 Personal history of colonic polyps: Secondary | ICD-10-CM

## 2020-10-20 DIAGNOSIS — E119 Type 2 diabetes mellitus without complications: Secondary | ICD-10-CM

## 2020-10-20 LAB — COMPREHENSIVE METABOLIC PANEL
ALT: 19 U/L (ref 0–44)
AST: 27 U/L (ref 15–41)
Albumin: 3.2 g/dL — ABNORMAL LOW (ref 3.5–5.0)
Alkaline Phosphatase: 58 U/L (ref 38–126)
Anion gap: 8 (ref 5–15)
BUN: 16 mg/dL (ref 8–23)
CO2: 24 mmol/L (ref 22–32)
Calcium: 8.5 mg/dL — ABNORMAL LOW (ref 8.9–10.3)
Chloride: 103 mmol/L (ref 98–111)
Creatinine, Ser: 1.09 mg/dL (ref 0.61–1.24)
GFR, Estimated: >60 mL/min (ref >60)
Glucose, Bld: 114 mg/dL — ABNORMAL HIGH (ref 70–99)
Potassium: 3.5 mmol/L (ref 3.5–5.1)
Sodium: 135 mmol/L (ref 135–145)
Total Bilirubin: 1.5 mg/dL — ABNORMAL HIGH (ref 0.3–1.2)
Total Protein: 7.5 g/dL (ref 6.5–8.1)

## 2020-10-20 LAB — CBC WITH DIFFERENTIAL/PLATELET
Abs Immature Granulocytes: 0.05 10*3/uL (ref 0.00–0.07)
Basophils Absolute: 0 10*3/uL (ref 0.0–0.1)
Basophils Relative: 0 %
Eosinophils Absolute: 0 10*3/uL (ref 0.0–0.5)
Eosinophils Relative: 0 %
HCT: 40.9 % (ref 39.0–52.0)
Hemoglobin: 13.8 g/dL (ref 13.0–17.0)
Immature Granulocytes: 0 %
Lymphocytes Relative: 2 %
Lymphs Abs: 0.2 10*3/uL — ABNORMAL LOW (ref 0.7–4.0)
MCH: 32.5 pg (ref 26.0–34.0)
MCHC: 33.7 g/dL (ref 30.0–36.0)
MCV: 96.5 fL (ref 80.0–100.0)
Monocytes Absolute: 0.2 10*3/uL (ref 0.1–1.0)
Monocytes Relative: 2 %
Neutro Abs: 11.2 10*3/uL — ABNORMAL HIGH (ref 1.7–7.7)
Neutrophils Relative %: 96 %
Platelets: 163 10*3/uL (ref 150–400)
RBC: 4.24 MIL/uL (ref 4.22–5.81)
RDW: 13.2 % (ref 11.5–15.5)
WBC: 11.7 10*3/uL — ABNORMAL HIGH (ref 4.0–10.5)
nRBC: 0 % (ref 0.0–0.2)

## 2020-10-20 LAB — TSH: TSH: 0.287 u[IU]/mL — ABNORMAL LOW (ref 0.350–4.500)

## 2020-10-20 LAB — RESP PANEL BY RT-PCR (FLU A&B, COVID) ARPGX2
Influenza A by PCR: NEGATIVE
Influenza B by PCR: NEGATIVE
SARS Coronavirus 2 by RT PCR: NEGATIVE

## 2020-10-20 LAB — TROPONIN I (HIGH SENSITIVITY)
Troponin I (High Sensitivity): 12 ng/L (ref <18)
Troponin I (High Sensitivity): 14 ng/L (ref <18)

## 2020-10-20 LAB — PROTIME-INR
INR: 2.6 — ABNORMAL HIGH (ref 0.8–1.2)
Prothrombin Time: 28.2 seconds — ABNORMAL HIGH (ref 11.4–15.2)

## 2020-10-20 LAB — APTT: aPTT: 40 seconds — ABNORMAL HIGH (ref 24–36)

## 2020-10-20 LAB — LACTIC ACID, PLASMA
Lactic Acid, Venous: 2.1 mmol/L (ref 0.5–1.9)
Lactic Acid, Venous: 2.4 mmol/L (ref 0.5–1.9)

## 2020-10-20 MED ORDER — WARFARIN - PHARMACIST DOSING INPATIENT: Freq: Every day | Status: DC

## 2020-10-20 MED ORDER — LACTATED RINGERS IV SOLN: INTRAVENOUS | Status: AC

## 2020-10-20 MED ORDER — ACETAMINOPHEN 325 MG PO TABS: 650.0000 mg | ORAL_TABLET | Freq: Four times a day (QID) | ORAL | Status: DC | PRN

## 2020-10-20 MED ORDER — DOCUSATE SODIUM 100 MG PO CAPS
100.0000 mg | ORAL_CAPSULE | Freq: Two times a day (BID) | ORAL | Status: DC
  Administered 2020-10-21: 100 mg via ORAL
  Filled 2020-10-20: qty 1

## 2020-10-20 MED ORDER — LACTATED RINGERS IV BOLUS
1000.0000 mL | Freq: Once | INTRAVENOUS | Status: AC
  Administered 2020-10-21: 1000 mL via INTRAVENOUS

## 2020-10-20 MED ORDER — ACETAMINOPHEN 500 MG PO TABS
1000.0000 mg | ORAL_TABLET | Freq: Once | ORAL | Status: AC
  Administered 2020-10-20: 1000 mg via ORAL
  Filled 2020-10-20: qty 2

## 2020-10-20 MED ORDER — WARFARIN SODIUM 7.5 MG PO TABS
7.5000 mg | ORAL_TABLET | Freq: Once | ORAL | Status: AC
  Administered 2020-10-21: 7.5 mg via ORAL
  Filled 2020-10-20: qty 1

## 2020-10-20 MED ORDER — DILTIAZEM HCL-DEXTROSE 125-5 MG/125ML-% IV SOLN (PREMIX)
5.0000 mg/h | INTRAVENOUS | Status: DC
  Administered 2020-10-20: 5 mg/h via INTRAVENOUS
  Filled 2020-10-20: qty 125

## 2020-10-20 MED ORDER — SODIUM CHLORIDE 0.9 % IV SOLN
2.0000 g | Freq: Three times a day (TID) | INTRAVENOUS | Status: DC
  Administered 2020-10-21: 2 g via INTRAVENOUS
  Filled 2020-10-20: qty 2

## 2020-10-20 MED ORDER — PIPERACILLIN-TAZOBACTAM 3.375 G IVPB 30 MIN
3.3750 g | Freq: Once | INTRAVENOUS | Status: AC
  Administered 2020-10-20: 3.375 g via INTRAVENOUS
  Filled 2020-10-20: qty 50

## 2020-10-20 MED ORDER — LACTATED RINGERS IV BOLUS (SEPSIS)
2000.0000 mL | Freq: Once | INTRAVENOUS | Status: AC
  Administered 2020-10-20: 2000 mL via INTRAVENOUS

## 2020-10-20 MED ORDER — VANCOMYCIN HCL 10 G IV SOLR
2500.0000 mg | Freq: Once | INTRAVENOUS | Status: AC
  Administered 2020-10-20: 2500 mg via INTRAVENOUS
  Filled 2020-10-20: qty 2500

## 2020-10-20 MED ORDER — VANCOMYCIN HCL IN DEXTROSE 1-5 GM/200ML-% IV SOLN
1000.0000 mg | Freq: Two times a day (BID) | INTRAVENOUS | Status: DC
  Administered 2020-10-21: 1000 mg via INTRAVENOUS
  Filled 2020-10-20 (×2): qty 200

## 2020-10-20 MED ORDER — LEVOTHYROXINE SODIUM 100 MCG PO TABS: 200.0000 ug | ORAL_TABLET | Freq: Every day | ORAL | Status: DC

## 2020-10-20 MED ORDER — WARFARIN SODIUM 7.5 MG PO TABS: 7.5000 mg | ORAL_TABLET | Freq: Every day | ORAL | Status: DC

## 2020-10-20 MED ORDER — LEVOTHYROXINE SODIUM 100 MCG PO TABS
200.0000 ug | ORAL_TABLET | Freq: Every day | ORAL | Status: DC
  Administered 2020-10-21 – 2020-10-23 (×3): 200 ug via ORAL
  Filled 2020-10-20 (×4): qty 2

## 2020-10-20 MED ORDER — INSULIN ASPART 100 UNIT/ML IJ SOLN
0.0000 [IU] | Freq: Three times a day (TID) | INTRAMUSCULAR | Status: DC
  Administered 2020-10-22 (×2): 2 [IU] via SUBCUTANEOUS

## 2020-10-20 MED ORDER — WARFARIN SODIUM 7.5 MG PO TABS: 7.5000 mg | ORAL_TABLET | Freq: Every evening | ORAL | Status: DC

## 2020-10-20 NOTE — Progress Notes (Signed)
[facility] following sepsis 

## 2020-10-20 NOTE — Progress Notes (Signed)
[facility] CONSULT NOTE - Initial Consult  Pharmacy Consult for Warfarin Indication: atrial fibrillation  Allergies  Allergen Reactions   Allegra [Fexofenadine] Other (See Comments)   Benadryl [Diphenhydramine] Swelling    Blisters    Patient Measurements: Height: 6\' 4"  (193 cm) Weight: 122.5 kg (270 lb) IBW/kg (Calculated) : 86.8  Vital Signs: Temp: 97.7 F (36.5 C) (09/11 1819) Temp Source: Oral (09/11 1819) BP: 101/59 (09/11 1845) Pulse Rate: 119 (09/11 1845)  Labs: Recent Labs    10/20/20 1617 10/20/20 1818  HGB 13.8  --   HCT 40.9  --   PLT 163  --   APTT 40*  --   LABPROT 28.2*  --   INR 2.6*  --   CREATININE 1.09  --   TROPONINIHS 12 14    Estimated Creatinine Clearance: 87.6 mL/min (by C-G formula based on SCr of 1.09 mg/dL).   Medical History: No past medical history on file.  Medications:  (Not in a hospital admission)  Scheduled:   docusate sodium  100 mg Oral BID   [START ON 10/21/2020] levothyroxine  200 mcg Oral Daily   warfarin  7.5 mg Oral Once   [START ON 10/21/2020] warfarin  7.5 mg Oral q1600   [START ON 10/21/2020] Warfarin - Pharmacist Dosing Inpatient   Does not apply q1600   Infusions:   diltiazem (CARDIZEM) infusion Stopped (10/20/20 1805)   lactated ringers     lactated ringers 150 mL/hr at 10/20/20 1658   [START ON 10/21/2020] vancomycin     PRN: acetaminophen Anti-infectives (From admission, onward)    Start     Dose/Rate Route Frequency Ordered Stop   10/21/20 0600  vancomycin (VANCOCIN) IVPB 1000 mg/200 mL premix        1,000 mg 200 mL/hr over 60 Minutes Intravenous Every 12 hours 10/20/20 1903     10/20/20 1700  vancomycin (VANCOCIN) 2,500 mg in sodium chloride 0.9 % 500 mL IVPB        2,500 mg 250 mL/hr over 120 Minutes Intravenous  Once 10/20/20 1654 10/20/20 2006   10/20/20 1630  piperacillin-tazobactam (ZOSYN) IVPB 3.375 g        3.375 g 100 mL/hr over 30 Minutes Intravenous  Once 10/20/20 1620 10/20/20 1807       Assessment: 70 yom being treated for cellulitis with a history of afib on coumadin, diastolic heart failure, Marfan's Syndrome S/P aortic valve replacement, T2DM, venous insuffiencey, seasonal allergies, and 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 Warfarin per pharmacy placed for AF.  Home dose: 7.5 mg daily - Last taken 9/10 INR today is 2.6  Hgb 13.8; plt 163  Goal of Therapy:  INR 2-3 Monitor platelets by [facility] protocol: Yes   Plan:  Warfarin 7.5 mg - planning to resume home dose tomorrow pending INR (dose already ordered) Daily INR Monitor for s/s of bleeding  12/20/20, PharmD, BCPS 10/20/2020 9:50 PM ED Clinical Pharmacist -  [phone]

## 2020-10-20 NOTE — H&P (Addendum)
Date: 10/20/2020               Patient Name:  Adam Lin MRN: [id number]  DOB: [birth year] Age / Sex: 70 y.o., male   PCP: System, Provider Not In         Medical Service: Internal Medicine Teaching Service         Attending Physician: Dr. 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 He was driving in the car to visit family he suddenly started shaking which he attributes to the chills. His wife put a sweatshirt on him at that time since he was cold. When they got to their destination he suddenly became confused. He thought was in [place] and at the mall. He also felt nauseated. EMS called as family was concerned of patient having a stroke. Fever taken at home was 102-103F. Denied shortness of breath, vomiting, diarrhea, abd pain, dysuria. Has had a chronic cough thought to be 2/2 his seasonal allergies and nasal drainage. Denies increasing pain in lower extremities.   His wife noticed LLE had increased swelling last night wrapped legs yesterday evening and placed compression stockings on them. Today she thinks that his legs look pretty much back to today.  Had a recent episode of leg swelling several weeks ago for which his cardiologist increased his lasix but is back down to his normal weight of ~270. He has had chronic discoloration of his legs for years and says that they are  no more red or swollen than normal.  ED Course: Brought from home by EMS, in route patient started on Cardizem for his afib with RVR. Patient's AMS resolved upon arrival. In ED patient received LR infusion, vancomycin and zosyn due to concern for sepsis. Cardizem drip was discontinued due to hypotension, patient's tachycardia improved with IV fluids. WBC of 11. Patient was deemed appropriate for admission for infection.   Meds:  Current Meds  Medication Sig   Ascorbic Acid (VITAMIN C) 500 MG CHEW Chew 1 tablet by mouth every evening.   B Complex Vitamins (VITAMIN B COMPLEX) TABS Take 1 tablet by mouth daily.   Cholecalciferol (VITAMIN D3) 25 MCG (1000 UT) CHEW Chew 1 tablet by mouth every evening.   Docusate Sodium 100 MG capsule Take 100 mg by mouth 2 (two) times daily.   furosemide (LASIX) 40 MG tablet Take 40 mg by mouth 2 (two) times daily.   levothyroxine (SYNTHROID) 200 MCG tablet Take 200 mcg by mouth daily.   lisinopril (ZESTRIL) 20 MG tablet Take 20 mg by mouth daily.   metoprolol (TOPROL-XL) 200 MG 24 hr tablet Take 200 mg by mouth every evening.   Multiple Vitamins-Minerals (MULTIVITAMIN ADULT) CHEW Chew 1 tablet by mouth every evening.   tamsulosin (FLOMAX) 0.4 MG CAPS capsule Take 0.4 mg by mouth daily.   warfarin (COUMADIN) 5 MG tablet Take 7.5 mg by mouth every  evening.   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 Lorel Lin, in [place]: Denies current tobacco use, smoked cigarettes for 1-2 months of his life. Denies alcohol use or other substance use.   Family History:  Mother CVA at 3, Father  had lung cancer (uncertain of type) Brother passed of cirrhosis 2/2 alcohol use Sister had alzheimer's disease  Allergies: Allergies as of 10/20/2020 - Review Complete 10/20/2020  Allergen Reaction Noted   Allegra [fexofenadine] Other (See Comments) 10/20/2020   Benadryl [diphenhydramine] Swelling 10/20/2020    Review of Systems: A complete ROS was negative except as per HPI.   OBJECTIVE:   Physical Exam: Blood pressure (!) 101/59, pulse (!) 119, temperature 97.7 F (36.5 C), temperature source Oral, resp. rate 16, height 6' 4"  (1.93 m), weight 122.5 kg, SpO2 94 %.  Constitutional: elderly man sitting up in bed in no acute distress HENT: normocephalic atraumatic, mucous membranes moist Eyes: conjunctiva non-erythematous Neck: supple Cardiovascular: irregularly irregular, tachycardic, mechanical click Pulmonary/Chest: normal work of breathing on room air, lungs clear to auscultation bilaterally Abdominal: soft, non-tender, non-distended MSK: normal bulk and tone, chronic venous stasis changes in the BLE, no ulcerations or oozing, 0.5 cm non-tender circular wound with serous crusting on the medial aspect of the R second toe Neurological: alert & oriented x 3, answers questions appropriately Skin: warm and dry, no osler nodes, janeway lesions, or splinter hemorrhages  Psych: normal affect    Labs: CBC    Component Value Date/Time   WBC 11.7 (H) 10/20/2020 1617   RBC 4.24 10/20/2020 1617   HGB 13.8 10/20/2020 1617   HCT 40.9 10/20/2020 1617   PLT 163 10/20/2020 1617   MCV 96.5 10/20/2020 1617   MCH 32.5 10/20/2020 1617   MCHC 33.7 10/20/2020 1617   RDW 13.2 10/20/2020 1617   LYMPHSABS 0.2 (L) 10/20/2020 1617   MONOABS 0.2 10/20/2020 1617   EOSABS 0.0 10/20/2020 1617   BASOSABS 0.0 10/20/2020 1617     CMP     Component Value Date/Time   NA 135 10/20/2020 1617   K 3.5 10/20/2020 1617   CL 103 10/20/2020 1617   CO2 24 10/20/2020 1617   GLUCOSE 114 (H) 10/20/2020  1617   BUN 16 10/20/2020 1617   CREATININE 1.09 10/20/2020 1617   CALCIUM 8.5 (L) 10/20/2020 1617   PROT 7.5 10/20/2020 1617   ALBUMIN 3.2 (L) 10/20/2020 1617   AST 27 10/20/2020 1617   ALT 19 10/20/2020 1617   ALKPHOS 58 10/20/2020 1617   BILITOT 1.5 (H) 10/20/2020 1617   GFRNONAA >60 10/20/2020 1617    Imaging: DG Chest Port 1 View  Result Date: 10/20/2020 CLINICAL DATA:  Questionable sepsis. EXAM: PORTABLE CHEST 1 VIEW COMPARISON:  None. FINDINGS: Tortuosity of the aorta.  Postsurgical changes from CABG. Borderline enlarged cardiac silhouette. There is no evidence of focal airspace consolidation, pleural effusion or pneumothorax. Osseous structures are without acute abnormality. Soft tissues are grossly normal. IMPRESSION: 1. No evidence of acute cardiopulmonary disease. 2. Borderline enlarged cardiac silhouette. Electronically Signed   By: Fidela Salisbury M.D.   On: 10/20/2020 17:40   DG Foot 2 Views Right  Result Date: 10/20/2020 CLINICAL DATA:  Fever, swelling, erythema EXAM: RIGHT FOOT - 2 VIEW COMPARISON:  None. FINDINGS: Frontal and lateral views of the right foot are obtained. There is moderate hallux valgus and hammertoe deformities. 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 No acute or destructive bony lesions. Small inferior cannulae spur. Mild diffuse soft tissue edema. IMPRESSION: 1. No acute or destructive bony lesions. 2. Diffuse soft tissue edema. 3. Multifocal degenerative changes. Electronically Signed   By: Randa Ngo M.D.   On: 10/20/2020 21:41    Echo 05/18/19 Mildly dilated left ventricle. Normal geometry. Moderately depressed left ventricular systolic function. Left ventricular diastolic function could not be assessed due to the presence of AFib. Mild mitral annular calcification. There is mild mitral regurgitation. There is a 29 bi-leaflet mechanical prosthesis in the aortic position. The aortic prosthesis  demonstrates a normal transvalvular gradient for valve type and size. The left ventricular ejection fraction by visual assessment is estimated to be 40%.   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  #Sepsis #Concern for endocarditis  #Marfan's Syndrome S/P aortic valve replacement in 1999 AMS had resolved by time of interview. Patient with a history of mechanical heart valve replacement and is at risk for endocarditis. Patient was seen at urgent care for fever of unknown source in March, presumed to be viral at that time but endocarditis was not ruled out. Do not suspect LE cellulitis, erythema is chronic and looks the same bilaterally. Patient denies any urinary symptoms. CXR does not show consolidation. Patient does have wound on the medial aspect of his right second toe that could be infectious source. Wound has been there for at least several weeks. Foot Xray did not show acute abnormality however if osteo is suspect can consider MRI. Lactic acid elevated at 2.1, 2.4, 3.6. - continue vanc, switching zosyn to cefepime - f/u Bcx (only 1 drawn in the ED prior to antibiotics) - f/u UA and Ucx - consider MRI of foot - f/u echo - f/u ESR, CRP - continue fluid  #Venous insufficiency #Foot Wound Chronic venous insufficiency likely the cause of patient's leg discoloration. Patient's foot is non-tender, says  that this has been ongoing for a while but not sure how long.  - wound care consult - Foot XR did not show acute abnormality  #Cardiomyopathy/Diastolic CHF #Atrial Fibrillation  Troponins negative in the ED. Patient became hypotensive on diltiazem drip in the ED, HR has improved with fluids 120s-80-90s. Patient not able to produce urine until after 2L of fluid. Suspect tachycardia is related to underlying infection/dehydration. Will need careful monitoring of volume status, HR, and BP. Last echo in 05/18/19 showed EF of 40% -s/p 3 2 LR - continue home warfarin - hold home metoprolol 2/2 hypotension - spironolactone and entresto intolerant - continue compression stockings - daily weights - continue daily furosemide 40 IV - K>4, Mg>2 - strict I/O - NYHA class I  #Stage 1 colon cancer of ascending  colon tubulovillous adenoma s/p resection in 2016 # Hx of abdominal hernia  - continue home cholace  #T2DM - diet controlled - SSI - f/u HA1c  #Hypothyroidism Continue home synthroid -TSH low, may need to adjust dose as an outpatient  #HTN -holding home lisinopril 2/2 acute infection  #Enlarged Prostate -holding tamsulosin 2/2 hypotension  Diet: Heart Healthy VTE: Enoxaparin IVF: LR,Bolus Code: Full  Prior to Admission Living Arrangement: Home, living with wife Anticipated Discharge Location: Home Barriers to Discharge: Continued medical workup of infection  Dispo: Admit patient to Inpatient with expected length of stay greater than 2 midnights.  Signed: Scarlett Presto, MD Internal Medicine Resident PGY-1 Pager: [phone]  10/20/2020, 11:36 PM

## 2020-10-20 NOTE — Progress Notes (Addendum)
Pharmacy Antibiotic Note  Adam Lin is a 70 y.o. male admitted on 10/20/2020 with cellulitis.  Pharmacy has been consulted for vancomycin dosing.   SCr 1.09 - BL unknown WBC 11.7; LA 2.1; T 104F  Plan: Zosyn per MD Vancomycin 2500mg  once then 1000mg  q12hr (eAUC 488) unless change in renal function Trend WBC, fever, renal function F/u cultures Levels at [facility] De-escalate antibiotics when appropriate  Addendum: Asked to change Zosyn to Cefepime Cefepime 2g IV q 8 hrs.     Temp (24hrs), Avg:99.3 F (37.4 C), Min:99.3 F (37.4 C), Max:99.3 F (37.4 C)  No results for input(s): WBC, CREATININE, LATICACIDVEN, VANCOTROUGH, VANCOPEAK, VANCORANDOM, GENTTROUGH, GENTPEAK, GENTRANDOM, TOBRATROUGH, TOBRAPEAK, TOBRARND, AMIKACINPEAK, AMIKACINTROU, AMIKACIN in the last 168 hours.  CrCl cannot be calculated (No successful lab value found.).    Allergies  Allergen Reactions   Allegra [Fexofenadine] Other (See Comments)   Antimicrobials this admission: zosyn 9/11 >>  vancomycin 9/11 >>   Microbiology results: Pending  Thank you for allowing pharmacy to be a part of this patient's care.  11/11, 11/11, [facility] Clinical Pharmacist  10/20/2020 10:52 PM   [facility] pharmacy phone numbers are listed on [email or web address]

## 2020-10-20 NOTE — ED Provider Notes (Signed)
[facility] EMERGENCY DEPARTMENT Provider Note   CSN: [id number] Arrival date & time: 10/20/20  1608     History Chief Complaint  Patient presents with   Code Sepsis    Adam Lin is a 70 y.o. male.  Pt presents to the ED today as an altered loc.  EMS said he was confused this morning.  Temp 104.  No tylenol/ibuprofen given.  EMS said he was in afib with rvr when they arrived.  They did given him 20 mg diltiazem en route.  Pt is more alert now.  He denies any pain.  He does remember some chills this morning.  He has been covid vaccinated + booster.  Pt used to live in [place], but moved to [place] several years ago.  He is here visiting his son.      No past medical history on file.  Afib/dm/htn/hypothyroidism/chf  Patient Active Problem List   Diagnosis Date Noted   Altered mental status 10/20/2020     Psurg hx:  heart surgery/valve replacement   Sochx:  no tob   No family history on file.     Home Medications Prior to Admission medications   Medication Sig Start Date End Date Taking? Authorizing Provider  Ascorbic Acid (VITAMIN C) 500 MG CHEW Chew 1 tablet by mouth every evening.   Yes [provider]  B Complex Vitamins (VITAMIN B COMPLEX) TABS Take 1 tablet by mouth daily.   Yes [provider]  Cholecalciferol (VITAMIN D3) 25 MCG (1000 UT) CHEW Chew 1 tablet by mouth every evening.   Yes [provider]  Docusate Sodium 100 MG capsule Take 100 mg by mouth 2 (two) times daily.   Yes [provider]  furosemide (LASIX) 40 MG tablet Take 40 mg by mouth 2 (two) times daily.   Yes [provider]  levothyroxine (SYNTHROID) 200 MCG tablet Take 200 mcg by mouth daily. 08/15/20  Yes [provider]  lisinopril (ZESTRIL) 20 MG tablet Take 20 mg by mouth daily. 07/31/20  Yes [provider]  metoprolol (TOPROL-XL) 200 MG 24 hr tablet Take 200 mg by mouth every evening. 09/09/20  Yes [provider]  Multiple Vitamins-Minerals (MULTIVITAMIN ADULT) CHEW Chew 1 tablet by mouth every evening.   Yes [provider]  tamsulosin (FLOMAX) 0.4 MG CAPS capsule Take 0.4 mg by mouth daily. 08/11/20  Yes [provider]  warfarin (COUMADIN) 5 MG tablet Take 7.5 mg by mouth every evening. 06/26/20  Yes [provider]  Coumadin/metoprolol/lasix/lisinopril/synthroid  Allergies    Allegra [fexofenadine] and Benadryl [diphenhydramine]  Review of Systems   Review of Systems  Constitutional:  Positive for fever.  All other systems reviewed and are negative.  Physical Exam Updated Vital Signs BP (!) 101/59   Pulse (!) 119   Temp 97.7 F (36.5 C) (Oral)   Resp 16   Ht 6\' 4"  (1.93 m)   Wt 122.5 kg   SpO2 94%   BMI 32.87 kg/m   Physical Exam Vitals and nursing note reviewed.  Constitutional:      Appearance: Normal appearance.  HENT:     Head: Normocephalic and atraumatic.     Right Ear: External ear normal.     Left Ear: External ear normal.     Nose: Nose normal.     Mouth/Throat:     Mouth: Mucous membranes are dry.  Eyes:     Extraocular Movements: Extraocular movements intact.     Conjunctiva/sclera:  Conjunctivae normal.     Pupils: Pupils are equal, round, and reactive to light.  Cardiovascular:     Rate and Rhythm: Tachycardia present. Rhythm irregular.     Pulses: Normal pulses.     Heart sounds: Normal heart sounds.  Pulmonary:     Effort: Pulmonary effort is normal.     Breath sounds: Normal breath sounds.  Abdominal:     General: Abdomen is flat. Bowel sounds are normal.     Palpations: Abdomen is soft.  Musculoskeletal:        General: Normal range of motion.     Cervical back: Normal range of motion and neck supple.  Skin:    General: Skin is warm.     Capillary Refill: Capillary refill takes less than 2 seconds.     Comments: Bilateral LE cellulitis.  See pictures.  Neurological:     General: No focal deficit present.      Mental Status: He is alert and oriented to person, place, and time.  Psychiatric:        Mood and Affect: Mood normal.     ED Results / Procedures / Treatments   Labs (all labs ordered are listed, but only abnormal results are displayed) Labs Reviewed  LACTIC ACID, PLASMA - Abnormal; Notable for the following components:      Result Value   Lactic Acid, Venous 2.1 (*)    All other components within normal limits  COMPREHENSIVE METABOLIC PANEL - Abnormal; Notable for the following components:   Glucose, Bld 114 (*)    Calcium 8.5 (*)    Albumin 3.2 (*)    Total Bilirubin 1.5 (*)    All other components within normal limits  CBC WITH DIFFERENTIAL/PLATELET - Abnormal; Notable for the following components:   WBC 11.7 (*)    Neutro Abs 11.2 (*)    Lymphs Abs 0.2 (*)    All other components within normal limits  PROTIME-INR - Abnormal; Notable for the following components:   Prothrombin Time 28.2 (*)    INR 2.6 (*)    All other components within normal limits  APTT - Abnormal; Notable for the following components:   aPTT 40 (*)    All other components within normal limits  RESP PANEL BY RT-PCR (FLU A&B, COVID) ARPGX2  CULTURE, BLOOD (SINGLE)  URINE CULTURE  CULTURE, BLOOD (SINGLE)  LACTIC ACID, PLASMA  URINALYSIS, ROUTINE W REFLEX MICROSCOPIC  TSH  TROPONIN I (HIGH SENSITIVITY)  TROPONIN I (HIGH SENSITIVITY)    EKG EKG Interpretation  Date/Time:  Sunday October 20 2020 16:57:25 EDT Ventricular Rate:  125 PR Interval:    QRS Duration: 93 QT Interval:  269 QTC Calculation: 388 R Axis:   13 Text Interpretation: Atrial fibrillation Repol abnrm suggests ischemia, lateral leads No old tracing to compare Confirmed by Jacalyn Lefevre [phone]) on 10/20/2020 4:59:44 PM  Radiology DG Chest Port 1 View  Result Date: 10/20/2020 CLINICAL DATA:  Questionable sepsis. EXAM: PORTABLE CHEST 1 VIEW COMPARISON:  None. FINDINGS: Tortuosity of the aorta.  Postsurgical changes from CABG.  Borderline enlarged cardiac silhouette. There is no evidence of focal airspace consolidation, pleural effusion or pneumothorax. Osseous structures are without acute abnormality. Soft tissues are grossly normal. IMPRESSION: 1. No evidence of acute cardiopulmonary disease. 2. Borderline enlarged cardiac silhouette. Electronically Signed   By: Ted Mcalpine M.D.   On: 10/20/2020 17:40    Procedures Procedures   Medications Ordered in ED Medications  lactated ringers infusion ( Intravenous New Bag/Given 10/20/20  1658)  diltiazem (CARDIZEM) 125 mg in dextrose 5% 125 mL (1 mg/mL) infusion (0 mg/hr Intravenous Paused 10/20/20 1805)  vancomycin (VANCOCIN) IVPB 1000 mg/200 mL premix (has no administration in time range)  piperacillin-tazobactam (ZOSYN) IVPB 3.375 g (0 g Intravenous Stopped 10/20/20 1807)  acetaminophen (TYLENOL) tablet 1,000 mg (1,000 mg Oral Given 10/20/20 1659)  vancomycin (VANCOCIN) 2,500 mg in sodium chloride 0.9 % 500 mL IVPB (0 mg Intravenous Stopped 10/20/20 2006)  lactated ringers bolus 2,000 mL (0 mLs Intravenous Stopped 10/20/20 1846)    ED Course  I have reviewed the triage vital signs and the nursing notes.  Pertinent labs & imaging results that were available during my care of the patient were reviewed by me and considered in my medical decision making (see chart for details).    MDM Rules/Calculators/A&P                           CHA2DS2/VAS Stroke Risk Points: 5 (on coumadin)  Pt meets criteria for sepsis.  He is given IVFs and vanc/zosyn.  Pt in afib with rvr.  He was given 20 mg cardizem by ems and put on a drip at 5 mg/hr.  However, due to his hypotension, the drip had to be stopped.  HR has come down with IVFs and fever treatment, but he is still a little tachy.  Pt d/w IMTS for admission.  CRITICAL CARE Performed by: Jacalyn Lefevre   Total critical care time: 30 minutes  Critical care time was exclusive of separately billable procedures and treating  other patients.  Critical care was necessary to treat or prevent imminent or life-threatening deterioration.  Critical care was time spent personally by me on the following activities: development of treatment plan with patient and/or surrogate as well as nursing, discussions with consultants, evaluation of patient's response to treatment, examination of patient, obtaining history from patient or surrogate, ordering and performing treatments and interventions, ordering and review of laboratory studies, ordering and review of radiographic studies, pulse oximetry and re-evaluation of patient's condition.         Final Clinical Impression(s) / ED Diagnoses Final diagnoses:  Sepsis, due to unspecified organism, unspecified whether acute organ dysfunction present [facility])  Cellulitis of right lower extremity  Cellulitis of left lower extremity  Atrial fibrillation with RVR [facility])    Rx / DC Orders ED Discharge Orders     None        Jacalyn Lefevre, MD 10/20/20 2019

## 2020-10-21 ENCOUNTER — Inpatient Hospital Stay (HOSPITAL_COMMUNITY): Payer: Medicare Other

## 2020-10-21 DIAGNOSIS — I5022 Chronic systolic (congestive) heart failure: Secondary | ICD-10-CM | POA: Diagnosis present

## 2020-10-21 DIAGNOSIS — Z952 Presence of prosthetic heart valve: Secondary | ICD-10-CM | POA: Insufficient documentation

## 2020-10-21 DIAGNOSIS — E669 Obesity, unspecified: Secondary | ICD-10-CM | POA: Insufficient documentation

## 2020-10-21 DIAGNOSIS — R509 Fever, unspecified: Secondary | ICD-10-CM

## 2020-10-21 DIAGNOSIS — I739 Peripheral vascular disease, unspecified: Secondary | ICD-10-CM | POA: Insufficient documentation

## 2020-10-21 DIAGNOSIS — I872 Venous insufficiency (chronic) (peripheral): Secondary | ICD-10-CM | POA: Diagnosis present

## 2020-10-21 LAB — GLUCOSE, CAPILLARY: Glucose-Capillary: 116 mg/dL — ABNORMAL HIGH (ref 70–99)

## 2020-10-21 LAB — CBC WITH DIFFERENTIAL/PLATELET
Abs Immature Granulocytes: 0.15 10*3/uL — ABNORMAL HIGH (ref 0.00–0.07)
Basophils Absolute: 0 10*3/uL (ref 0.0–0.1)
Basophils Relative: 0 %
Eosinophils Absolute: 0 10*3/uL (ref 0.0–0.5)
Eosinophils Relative: 0 %
HCT: 41.5 % (ref 39.0–52.0)
Hemoglobin: 13.2 g/dL (ref 13.0–17.0)
Immature Granulocytes: 1 %
Lymphocytes Relative: 2 %
Lymphs Abs: 0.4 10*3/uL — ABNORMAL LOW (ref 0.7–4.0)
MCH: 31.9 pg (ref 26.0–34.0)
MCHC: 31.8 g/dL (ref 30.0–36.0)
MCV: 100.2 fL — ABNORMAL HIGH (ref 80.0–100.0)
Monocytes Absolute: 0.4 10*3/uL (ref 0.1–1.0)
Monocytes Relative: 2 %
Neutro Abs: 16.3 10*3/uL — ABNORMAL HIGH (ref 1.7–7.7)
Neutrophils Relative %: 95 %
Platelets: 149 10*3/uL — ABNORMAL LOW (ref 150–400)
RBC: 4.14 MIL/uL — ABNORMAL LOW (ref 4.22–5.81)
RDW: 13.5 % (ref 11.5–15.5)
WBC: 17.3 10*3/uL — ABNORMAL HIGH (ref 4.0–10.5)
nRBC: 0 % (ref 0.0–0.2)

## 2020-10-21 LAB — COMPREHENSIVE METABOLIC PANEL
ALT: 20 U/L (ref 0–44)
AST: 31 U/L (ref 15–41)
Albumin: 2.9 g/dL — ABNORMAL LOW (ref 3.5–5.0)
Alkaline Phosphatase: 56 U/L (ref 38–126)
Anion gap: 9 (ref 5–15)
BUN: 15 mg/dL (ref 8–23)
CO2: 24 mmol/L (ref 22–32)
Calcium: 8.5 mg/dL — ABNORMAL LOW (ref 8.9–10.3)
Chloride: 103 mmol/L (ref 98–111)
Creatinine, Ser: 1.29 mg/dL — ABNORMAL HIGH (ref 0.61–1.24)
GFR, Estimated: 59 mL/min — ABNORMAL LOW (ref >60)
Glucose, Bld: 125 mg/dL — ABNORMAL HIGH (ref 70–99)
Potassium: 4.4 mmol/L (ref 3.5–5.1)
Sodium: 136 mmol/L (ref 135–145)
Total Bilirubin: 1.9 mg/dL — ABNORMAL HIGH (ref 0.3–1.2)
Total Protein: 7.2 g/dL (ref 6.5–8.1)

## 2020-10-21 LAB — ECHOCARDIOGRAM COMPLETE
AR max vel: 3.43 cm2
AV Area VTI: 3.91 cm2
AV Area mean vel: 3.27 cm2
AV Mean grad: 9.3 mmHg
AV Peak grad: 14.5 mmHg
Ao pk vel: 1.9 m/s
Height: 76 in
S' Lateral: 4.5 cm
Weight: 4320 oz

## 2020-10-21 LAB — RESPIRATORY PANEL BY PCR

## 2020-10-21 LAB — LACTIC ACID, PLASMA
Lactic Acid, Venous: 3.6 mmol/L (ref 0.5–1.9)
Lactic Acid, Venous: 1.7 mmol/L (ref 0.5–1.9)

## 2020-10-21 LAB — PROTIME-INR
INR: 3 — ABNORMAL HIGH (ref 0.8–1.2)
Prothrombin Time: 30.9 seconds — ABNORMAL HIGH (ref 11.4–15.2)

## 2020-10-21 LAB — URINALYSIS, ROUTINE W REFLEX MICROSCOPIC
Bilirubin Urine: NEGATIVE
Glucose, UA: NEGATIVE mg/dL
Hgb urine dipstick: NEGATIVE
Ketones, ur: NEGATIVE mg/dL
Leukocytes,Ua: NEGATIVE
Nitrite: NEGATIVE
Protein, ur: NEGATIVE mg/dL
Specific Gravity, Urine: 1.02 (ref 1.005–1.030)
pH: 5.5 (ref 5.0–8.0)

## 2020-10-21 LAB — SAVE SMEAR(SSMR), FOR PROVIDER SLIDE REVIEW

## 2020-10-21 LAB — TECHNOLOGIST SMEAR REVIEW

## 2020-10-21 LAB — CBG MONITORING, ED
Glucose-Capillary: 119 mg/dL — ABNORMAL HIGH (ref 70–99)
Glucose-Capillary: 97 mg/dL (ref 70–99)

## 2020-10-21 LAB — SEDIMENTATION RATE: Sed Rate: 21 mm/hr — ABNORMAL HIGH (ref 0–16)

## 2020-10-21 LAB — HEMOGLOBIN A1C
Hgb A1c MFr Bld: 5.9 % — ABNORMAL HIGH (ref 4.8–5.6)
Mean Plasma Glucose: 122.63 mg/dL

## 2020-10-21 LAB — PROCALCITONIN: Procalcitonin: 12.42 ng/mL

## 2020-10-21 LAB — C-REACTIVE PROTEIN: CRP: 3.1 mg/dL — ABNORMAL HIGH (ref <1.0)

## 2020-10-21 MED ORDER — SENNOSIDES-DOCUSATE SODIUM 8.6-50 MG PO TABS
1.0000 | ORAL_TABLET | Freq: Two times a day (BID) | ORAL | Status: DC
  Administered 2020-10-21 – 2020-10-23 (×4): 1 via ORAL
  Filled 2020-10-21 (×5): qty 1

## 2020-10-21 MED ORDER — SODIUM CHLORIDE 0.9 % IV SOLN
3.0000 g | Freq: Four times a day (QID) | INTRAVENOUS | Status: DC
  Administered 2020-10-21 – 2020-10-23 (×7): 3 g via INTRAVENOUS
  Filled 2020-10-21 (×9): qty 8

## 2020-10-21 MED ORDER — WARFARIN SODIUM 5 MG PO TABS
5.0000 mg | ORAL_TABLET | Freq: Once | ORAL | Status: AC
  Administered 2020-10-21: 5 mg via ORAL
  Filled 2020-10-21: qty 1

## 2020-10-21 MED ORDER — PERFLUTREN LIPID MICROSPHERE
1.0000 mL | INTRAVENOUS | Status: AC | PRN
  Administered 2020-10-21: 3 mL via INTRAVENOUS
  Filled 2020-10-21: qty 10

## 2020-10-21 MED ORDER — LACTATED RINGERS IV BOLUS
1000.0000 mL | Freq: Once | INTRAVENOUS | Status: AC
  Administered 2020-10-21: 1000 mL via INTRAVENOUS

## 2020-10-21 NOTE — Consult Note (Signed)
WOC Nurse wound consult note Consultation was completed by review of records, images and assistance from the bedside nurse/clinical staff.  Reason for Consult: toe wound and cellulitis Reviewed images, do not feel patient has cellulitis, hemosiderin staining from chronic venous stasis.  Family wraps his legs for edema control and using compression stockings.  Wound type: full thickness ulceration right 2nd toe medial; could be from trauma; most likely related to his DM.  Pressure Injury POA: NA Measurement:0.2c x 0.3cm x 0.1cm  Wound bed:100% pink, clean Drainage (amount, consistency, odor) not able to assess from images  Periwound:intact  Dressing procedure/placement/frequency:  Neosporin and bandaid to the toe wound.  Wrap legs from toes to knees with kerlix and 4" ACE wraps with 50% stretch (some tension) for mild compression. Ok to unwrap at night for sleep.  If family would like to bring in stockings ok to use them instead   Re consult if needed, will not follow at this time. Thanks  Alexi D[employer]iney [employer], RN,CWOCN, CNS, CWON-AP [phone])

## 2020-10-21 NOTE — Progress Notes (Signed)
HD#1 SUBJECTIVE:  Patient Summary: Adam Lin is a 70 y.o. 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   Overnight Events: No events overnight.    OBJECTIVE:  Vital Signs: Vitals:   10/21/20 1130 10/21/20 1353 10/21/20 1400 10/21/20 1500  BP: 136/70  139/84 104/90  Pulse: 98  92 74  Resp: (!) 21  (!) 22 (!) 23  Temp:  99.2 F (37.3 C)    TempSrc:  Oral    SpO2: 98%  93% 94%  Weight:      Height:       Supplemental O2: Room Air SpO2: 94 %  Filed Weights   10/20/20 1710  Weight: 122.5 kg     Intake/Output Summary (Last 24 hours) at 10/21/2020 1557 Last data filed at 10/21/2020 1209 Gross per 24 hour  Intake 5312.12 ml  Output 600 ml  Net 4712.12 ml   Net IO Since Admission: 4,712.12 mL [10/21/20 1557]  Physical Exam: Constitutional: Elderly man sitting up in bed in no acute distress HENT: NCAT Eyes: no scleral icterus, conjunctiva clear CV: Irregularly irregular, mechanical click Pulm: Clear to auscultation bilaterally, normal pulmonary effort GI: No tenderness, abdomen soft MSK: 1+ pitting edema on legs bilaterally, chronic venous stasis changes in legs Skin: Warm and dry Psych: Normal mood and affect   Patient Lines/Drains/Airways Status     Active Line/Drains/Airways     Name Placement date Placement time Site Days   Peripheral IV 10/20/20 20 G Anterior;Distal;Left;Upper Antecubital 10/20/20  --  Antecubital  1   Peripheral IV 10/20/20 20 G Left;Posterior Hand 10/20/20  1700  Hand  1   Peripheral IV 10/20/20 20 G 1" Anterior;Distal;Right;Upper Arm 10/20/20  --  Arm  1   Wound / Incision (Open or Dehisced) 10/21/20 Non-pressure wound Toe (Comment  which one) Anterior;Right 10/21/20  0752  Toe (Comment  which one)  less  than 1            Pertinent Labs: CBC Latest Ref Rng & Units 10/21/2020 10/20/2020  WBC 4.0 - 10.5 K/uL 17.3(H) 11.7(H)  Hemoglobin 13.0 - 17.0 g/dL 13.2 13.8  Hematocrit 39.0 - 52.0 % 41.5 40.9  Platelets 150 - 400 K/uL 149(L) 163    CMP Latest Ref Rng & Units 10/21/2020 10/20/2020  Glucose 70 - 99 mg/dL 125(H) 114(H)  BUN 8 - 23 mg/dL 15 16  Creatinine 0.61 - 1.24 mg/dL 1.29(H) 1.09  Sodium 135 - 145 mmol/L 136 135  Potassium 3.5 - 5.1 mmol/L 4.4 3.5  Chloride 98 - 111 mmol/L 103 103  CO2 22 - 32 mmol/L 24 24  Calcium 8.9 - 10.3 mg/dL 8.5(L) 8.5(L)  Total Protein 6.5 - 8.1 g/dL 7.2 7.5  Total Bilirubin 0.3 - 1.2 mg/dL 1.9(H) 1.5(H)  Alkaline Phos 38 - 126 U/L 56 58  AST 15 - 41 U/L 31 27  ALT 0 - 44 U/L 20 19    Recent Labs    10/21/20 1201  GLUCAP 119*     Pertinent Imaging: DG Chest Port 1 View  Result Date: 10/20/2020 CLINICAL DATA:  Questionable sepsis. EXAM: PORTABLE CHEST 1 VIEW COMPARISON:  None. FINDINGS: Tortuosity of the aorta.  Postsurgical changes from CABG. Borderline enlarged cardiac silhouette. There is no evidence of focal airspace consolidation, pleural effusion or pneumothorax. Osseous  structures are without acute abnormality. Soft tissues are grossly normal. IMPRESSION: 1. No evidence of acute cardiopulmonary disease. 2. Borderline enlarged cardiac silhouette. Electronically Signed   By: Fidela Salisbury M.D.   On: 10/20/2020 17:40   DG Foot 2 Views Right  Result Date: 10/20/2020 CLINICAL DATA:  Fever, swelling, erythema EXAM: RIGHT FOOT - 2 VIEW COMPARISON:  None. FINDINGS: Frontal and lateral views of the right foot are obtained. There is moderate hallux valgus and hammertoe deformities. 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 No acute or destructive bony lesions. Small inferior cannulae spur. Mild diffuse soft tissue edema. IMPRESSION: 1. No acute or destructive bony lesions. 2. Diffuse soft tissue edema.  3. Multifocal degenerative changes. Electronically Signed   By: Randa Ngo M.D.   On: 10/20/2020 21:41   ECHOCARDIOGRAM COMPLETE  Result Date: 10/21/2020    ECHOCARDIOGRAM REPORT   Patient Name:   Adam Lin Date of Exam: 10/21/2020 Medical Rec #:  [id number]        Height:       76.0 in Accession #:    [id number]       Weight:       270.0 lb Date of Birth:  1951/01/20         BSA:          2.517 m Patient Age:    45 years         BP:           115/74 mmHg Patient Gender: M                HR:           104 bpm. Exam Location:  Inpatient Procedure: 2D Echo, Cardiac Doppler, Color Doppler and Intracardiac            Opacification Agent Indications:    Fever  History:        Patient has prior history of Echocardiogram examinations.                 Arrythmias:Atrial Fibrillation; Risk Factors:Diabetes. S/P AVR.                 Marfan syndrome.                 Aortic Valve: St. Jude bileaflet valve is present in the aortic                 position. Procedure Date: 1999 ([place]).  Sonographer:    Clayton Lefort RDCS (AE) Referring Phys: [id number] [facility]  Sonographer Comments: Technically difficult study due to poor echo windows. IMPRESSIONS  1. Left ventricular ejection fraction, by estimation, is 45 to 50%. The left ventricle has mildly decreased function. The left ventricle demonstrates global hypokinesis. The left ventricular internal cavity size was mildly dilated. There is moderate left ventricular hypertrophy. Left ventricular diastolic function could not be evaluated.  2. Right ventricular systolic function is mildly reduced. The right ventricular size is mildly enlarged. There is mildly elevated pulmonary artery systolic pressure. The estimated right ventricular systolic pressure is 35.4 mmHg.  3. Left atrial size was severely dilated.  4. Right atrial size was moderately dilated.  5. The mitral valve is abnormal. Mild to moderate mitral valve regurgitation.  6. The aortic valve has been  repaired/replaced. Aortic valve regurgitation is trivial. There is a St. Jude bileaflet valve present in the aortic position. Procedure Date: 1999 ([place]). Echo findings are consistent with normal structure  and function of the aortic valve prosthesis. Aortic valve mean gradient measures 9.3 mmHg. Aortic valve Vmax measures 1.90 m/s.  7. Aortic dilatation noted. There is mild dilatation of the aortic root, measuring 40 mm.  8. The inferior vena cava is dilated in size with <50% respiratory variability, suggesting right atrial pressure of 15 mmHg. Comparison(s): Changes from prior study are noted. 05/18/2019 ([place]): LVEF 40%, mildly dilated LV, afib, mild MR, 29 mm bi-leaflet St. Jude valve, severe LAE, moderate RAE. FINDINGS  Left Ventricle: Left ventricular ejection fraction, by estimation, is 45 to 50%. The left ventricle has mildly decreased function. The left ventricle demonstrates global hypokinesis. Definity contrast agent was given IV to delineate the left ventricular  endocardial borders. The left ventricular internal cavity size was mildly dilated. There is moderate left ventricular hypertrophy. Left ventricular diastolic function could not be evaluated due to atrial fibrillation. Left ventricular diastolic function  could not be evaluated. Right Ventricle: The right ventricular size is mildly enlarged. No increase in right ventricular wall thickness. Right ventricular systolic function is mildly reduced. There is mildly elevated pulmonary artery systolic pressure. The tricuspid regurgitant  velocity is 2.42 m/s, and with an assumed right atrial pressure of 15 mmHg, the estimated right ventricular systolic pressure is 81.0 mmHg. Left Atrium: Left atrial size was severely dilated. Right Atrium: Right atrial size was moderately dilated. Pericardium: There is no evidence of pericardial effusion. Mitral Valve: The mitral valve is abnormal. There is mild thickening of the mitral valve leaflet(s). Mild to moderate  mitral valve regurgitation. Tricuspid Valve: The tricuspid valve is grossly normal. Tricuspid valve regurgitation is mild. Aortic Valve: The aortic valve has been repaired/replaced. Aortic valve regurgitation is trivial. Aortic valve mean gradient measures 9.3 mmHg. Aortic valve peak gradient measures 14.5 mmHg. Aortic valve area, by VTI measures 3.91 cm. There is a St. Jude bileaflet valve present in the aortic position. Procedure Date: 1999 ([place]). Echo findings are consistent with normal structure and function of the aortic valve prosthesis. Pulmonic Valve: The pulmonic valve was normal in structure. Pulmonic valve regurgitation is not visualized. Aorta: Aortic dilatation noted. There is mild dilatation of the aortic root, measuring 40 mm. Venous: The inferior vena cava is dilated in size with less than 50% respiratory variability, suggesting right atrial pressure of 15 mmHg. IAS/Shunts: No atrial level shunt detected by color flow Doppler.  LEFT VENTRICLE PLAX 2D LVIDd:         5.90 cm LVIDs:         4.50 cm LV PW:         1.20 cm LV IVS:        1.40 cm LVOT diam:     2.90 cm LV SV:         115 LV SV Index:   46 LVOT Area:     6.61 cm  RIGHT VENTRICLE            IVC RV Basal diam:  4.10 cm    IVC diam: 3.50 cm RV Mid diam:    4.00 cm RV S prime:     9.90 cm/s TAPSE (M-mode): 1.3 cm LEFT ATRIUM              Index       RIGHT ATRIUM           Index LA diam:        5.70 cm  2.26 cm/m  RA Area:     35.80 cm LA Vol (A2C):  128.0 ml 50.86 ml/m RA Volume:   138.00 ml 54.84 ml/m LA Vol (A4C):   142.0 ml 56.43 ml/m LA Biplane Vol: 138.0 ml 54.84 ml/m  AORTIC VALVE AV Area (Vmax):    3.43 cm AV Area (Vmean):   3.27 cm AV Area (VTI):     3.91 cm AV Vmax:           190.33 cm/s AV Vmean:          144.333 cm/s AV VTI:            0.295 m AV Peak Grad:      14.5 mmHg AV Mean Grad:      9.3 mmHg LVOT Vmax:         98.70 cm/s LVOT Vmean:        71.400 cm/s LVOT VTI:          0.175 m LVOT/AV VTI ratio: 0.59  AORTA Ao  Root diam: 4.00 cm Ao Asc diam:  3.50 cm TRICUSPID VALVE TR Peak grad:   23.4 mmHg TR Vmax:        242.00 cm/s  SHUNTS Systemic VTI:  0.17 m Systemic Diam: 2.90 cm Adam Bishop MD Electronically signed by Adam Bishop MD Signature Date/Time: 10/21/2020/11:29:16 AM    Final     ASSESSMENT/PLAN:  Assessment: Principal Problem:   Fever Active Problems:   Chronic HFrEF (heart failure with reduced ejection fraction) (HCC)   Venous insufficiency   Chronic atrial fibrillation (HCC)   Cirrhosis of liver (HCC)   Type 2 diabetes mellitus (HCC)  Adam Lin is a 70 y/o M with a PMHx of afib on coumadin, diastolic heart failure, Marfan's Syndrome S/P aortic valve replacement, diet controlled T2DM, venous insuffiencey, seasonal allergies, and 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   Plan: #Fever, possible sepsis#concern for endocarditis#Marfan's syndrome s/p aortic valve replacement in 1999 Patient presented with fever.  Antibiotics including cefepime and vancomycin initiated in the ED.  He stated that he has not had recent illness, besides having some sinus symptoms in the last week.  Blood cultures are pending, but were collected after initial antibiotics.  He was initiated on vancomycin and cefepime.  We will de-escalate antibiotics to Unasyn to cover for gram-negative bacteria and for symptoms of possible sinus infection.  No source of infection identified.  We will continue antibiotics to 2 history of chronic liver disease, diabetes, advanced age and likely mild immunosuppression.  Echo showed reduced ejection fraction of 45 to 50% with global hypokinesis.  No signs concerning for endocarditis on echo.  05/2019 showed left ventricular ejection fraction of 40%. CRP elevated at 3.1.  ESR elevated at 21. -Discontinue vancomycin and cefepime -Started Unasyn -Follow-up on  blood cultures, no growth thus far -Follow-up on urine culture  #Chronic valvular atrial fibrillation Fibrillation with no RVR. -Continue warfarin, goal INR of 2-3 due to mechanical valve  #HFrEF Patient seems to be hypovolemic with a fever on admission concerning for sepsis.  He received 4 L of IV fluid.  On exam he appears to be slightly hypervolemic.  We will discontinue IV fluids. -Will consider restarting oral Lasix tomorrow  Best Practice: Diet: Cardiac diet IVF: Fluids: none VTE: warfarin Code: Full AB: Unasyn Family Contact: will contact Adam Lin, patient's wife DISPO: Anticipated discharge to home.  Signature: Adam Lin, D.O. Internal Medicine Resident, PGY-1 [facility]  Pager: [phone] 3:57 PM, 10/21/2020   Please contact the on call pager after 5 pm and on weekends at [phone].

## 2020-10-21 NOTE — Progress Notes (Signed)
  Echocardiogram 2D Echocardiogram has been performed.  Gerda Diss 10/21/2020, 10:18 AM

## 2020-10-21 NOTE — Progress Notes (Signed)
Pharmacy Antibiotic Note  Adam Lin is a 69 y.o. male admitted on 10/20/2020 with bacteremia.  Pharmacy has been consulted for unasyn dosing.  Plan: Unasyn 3g IV q6h  -Monitor renal function, clinical status, and antibiotic plan  Height: 6\' 4"  (193 cm) Weight: 122.5 kg (270 lb) IBW/kg (Calculated) : 86.8  Temp (24hrs), Avg:99.5 F (37.5 C), Min:97.7 F (36.5 C), Max:102.4 F (39.1 C)  Recent Labs  Lab 10/20/20 1617 10/20/20 1817 10/21/20 0045 10/21/20 0047 10/21/20 0531  WBC 11.7*  --   --  17.3*  --   CREATININE 1.09  --   --  1.29*  --   LATICACIDVEN 2.1* 2.4* 3.6*  --  1.7    Estimated Creatinine Clearance: 74 mL/min (A) (by C-G formula based on SCr of 1.29 mg/dL (H)).    Allergies  Allergen Reactions   Allegra [Fexofenadine] Other (See Comments)   Benadryl [Diphenhydramine] Swelling    Blisters    Antimicrobials this admission: Vanc/zosyn x1 dose Unasyn 9/12 >> =  Dose adjustments this admission: N/A  Microbiology results: 9/12 BCx:  9/12 UCx:  Thank you for allowing pharmacy to be a part of this patient's care.  11/12, PharmD, [facility] Emergency Medicine Clinical Pharmacist ED RPh Phone: [phone] Main RX: [phone]

## 2020-10-21 NOTE — Progress Notes (Addendum)
ANTICOAGULATION CONSULT NOTE - Follow Up Consult  Pharmacy Consult for Warfarin Indication: atrial fibrillation  Allergies  Allergen Reactions   Allegra [Fexofenadine] Other (See Comments)   Benadryl [Diphenhydramine] Swelling    Blisters    Patient Measurements: Height: 6\' 4"  (193 cm) Weight: 122.5 kg (270 lb) IBW/kg (Calculated) : 86.8  Vital Signs: Temp: 99.2 F (37.3 C) (09/12 1353) Temp Source: Oral (09/12 1353) BP: 139/84 (09/12 1400) Pulse Rate: 92 (09/12 1400)  Labs: Recent Labs    10/20/20 1617 10/20/20 1818 10/21/20 0047  HGB 13.8  --  13.2  HCT 40.9  --  41.5  PLT 163  --  149*  APTT 40*  --   --   LABPROT 28.2*  --  30.9*  INR 2.6*  --  3.0*  CREATININE 1.09  --  1.29*  TROPONINIHS 12 14  --      Estimated Creatinine Clearance: 74 mL/min (A) (by C-G formula based on SCr of 1.29 mg/dL (H)).   Medical History: No past medical history on file.  Medications:  (Not in a hospital admission) Scheduled:   insulin aspart  0-15 Units Subcutaneous TID WC   levothyroxine  200 mcg Oral Daily   senna-docusate  1 tablet Oral BID   warfarin  5 mg Oral ONCE-1600   Warfarin - Pharmacist Dosing Inpatient   Does not apply q1600   Infusions:   ampicillin-sulbactam (UNASYN) IV Stopped (10/21/20 1209)   PRN: acetaminophen Anti-infectives (From admission, onward)    Start     Dose/Rate Route Frequency Ordered Stop   10/21/20 1030  Ampicillin-Sulbactam (UNASYN) 3 g in sodium chloride 0.9 % 100 mL IVPB        3 g 200 mL/hr over 30 Minutes Intravenous Every 6 hours 10/21/20 1013     10/21/20 0600  vancomycin (VANCOCIN) IVPB 1000 mg/200 mL premix  Status:  Discontinued        1,000 mg 200 mL/hr over 60 Minutes Intravenous Every 12 hours 10/20/20 1903 10/21/20 0914   10/21/20 0100  ceFEPIme (MAXIPIME) 2 g in sodium chloride 0.9 % 100 mL IVPB  Status:  Discontinued        2 g 200 mL/hr over 30 Minutes Intravenous Every 8 hours 10/20/20 2251 10/21/20 0914    10/20/20 1700  vancomycin (VANCOCIN) 2,500 mg in sodium chloride 0.9 % 500 mL IVPB        2,500 mg 250 mL/hr over 120 Minutes Intravenous  Once 10/20/20 1654 10/20/20 2006   10/20/20 1630  piperacillin-tazobactam (ZOSYN) IVPB 3.375 g        3.375 g 100 mL/hr over 30 Minutes Intravenous  Once 10/20/20 1620 10/20/20 1807      Assessment: 70 yom being treated for cellulitis with a history of afib on coumadin, diastolic heart failure, Marfan's Syndrome S/P aortic valve replacement, T2DM, venous insuffiencey, seasonal allergies, and 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 Warfarin per pharmacy placed for AF.  Home dose: 7.5 mg daily - Last dose 9/12 @ 0029 INR trended slightly up from yesterday, 2.6 > 3.0 today  CBC wnl  Goal of Therapy:  INR 2-3 Monitor platelets by anticoagulation protocol: Yes   Plan: Started on Unasyn today for possible bacteremia/sinusitis > with slight up trend in INR will reduce Warfarin empirically and give 5mg  x1 tonight Daily INR Monitor for s/s of bleeding  11/12, PharmD, [facility] Emergency Medicine Clinical Pharmacist ED RPh Phone: [phone] Main RX: [phone]

## 2020-10-21 NOTE — ED Notes (Signed)
Patient ate breakfast without RN knowledge. Too late for insulin meal coverage at this time.

## 2020-10-21 NOTE — Progress Notes (Signed)
Patient reevaluated at bedside, states he has had episodes of chills but has no other acute concerns.  Denies any episodes of shortness of breath.  He notes he is only urinated " a little bit."  Nursing staff report approximately 1000 cc urinary output.  With patient's history of severe BPH and the amount of IV fluids he is received we will BladderScan to assess whether or not placement of a Foley catheter is necessary.  Holding off on patient's home tamsulosin due to his suspected bacteremia and possible sepsis.  Repeat lactic acid is increased to 3.6, will continue to trend every 2 hours.

## 2020-10-21 NOTE — ED Notes (Signed)
Bladder Scan 0 ml

## 2020-10-22 DIAGNOSIS — R509 Fever, unspecified: Secondary | ICD-10-CM | POA: Diagnosis not present

## 2020-10-22 LAB — COMPREHENSIVE METABOLIC PANEL
ALT: 20 U/L (ref 0–44)
AST: 29 U/L (ref 15–41)
Albumin: 2.6 g/dL — ABNORMAL LOW (ref 3.5–5.0)
Alkaline Phosphatase: 51 U/L (ref 38–126)
Anion gap: 6 (ref 5–15)
BUN: 13 mg/dL (ref 8–23)
CO2: 24 mmol/L (ref 22–32)
Calcium: 8.2 mg/dL — ABNORMAL LOW (ref 8.9–10.3)
Chloride: 102 mmol/L (ref 98–111)
Creatinine, Ser: 0.96 mg/dL (ref 0.61–1.24)
GFR, Estimated: >60 mL/min (ref >60)
Glucose, Bld: 108 mg/dL — ABNORMAL HIGH (ref 70–99)
Potassium: 3.3 mmol/L — ABNORMAL LOW (ref 3.5–5.1)
Sodium: 132 mmol/L — ABNORMAL LOW (ref 135–145)
Total Bilirubin: 1.9 mg/dL — ABNORMAL HIGH (ref 0.3–1.2)
Total Protein: 6.6 g/dL (ref 6.5–8.1)

## 2020-10-22 LAB — PROTIME-INR
INR: 2.6 — ABNORMAL HIGH (ref 0.8–1.2)
Prothrombin Time: 27.4 seconds — ABNORMAL HIGH (ref 11.4–15.2)

## 2020-10-22 LAB — CBC WITH DIFFERENTIAL/PLATELET
Abs Immature Granulocytes: 0.02 10*3/uL (ref 0.00–0.07)
Basophils Absolute: 0 10*3/uL (ref 0.0–0.1)
Basophils Relative: 0 %
Eosinophils Absolute: 0 10*3/uL (ref 0.0–0.5)
Eosinophils Relative: 0 %
HCT: 36.8 % — ABNORMAL LOW (ref 39.0–52.0)
Hemoglobin: 12.1 g/dL — ABNORMAL LOW (ref 13.0–17.0)
Immature Granulocytes: 0 %
Lymphocytes Relative: 8 %
Lymphs Abs: 0.5 10*3/uL — ABNORMAL LOW (ref 0.7–4.0)
MCH: 31.8 pg (ref 26.0–34.0)
MCHC: 32.9 g/dL (ref 30.0–36.0)
MCV: 96.6 fL (ref 80.0–100.0)
Monocytes Absolute: 0.4 10*3/uL (ref 0.1–1.0)
Monocytes Relative: 7 %
Neutro Abs: 4.8 10*3/uL (ref 1.7–7.7)
Neutrophils Relative %: 85 %
Platelets: 94 10*3/uL — ABNORMAL LOW (ref 150–400)
RBC: 3.81 MIL/uL — ABNORMAL LOW (ref 4.22–5.81)
RDW: 13.5 % (ref 11.5–15.5)
WBC: 5.7 10*3/uL (ref 4.0–10.5)
nRBC: 0 % (ref 0.0–0.2)

## 2020-10-22 LAB — GLUCOSE, CAPILLARY
Glucose-Capillary: 102 mg/dL — ABNORMAL HIGH (ref 70–99)
Glucose-Capillary: 123 mg/dL — ABNORMAL HIGH (ref 70–99)
Glucose-Capillary: 126 mg/dL — ABNORMAL HIGH (ref 70–99)
Glucose-Capillary: 130 mg/dL — ABNORMAL HIGH (ref 70–99)

## 2020-10-22 LAB — URINE CULTURE: Culture: NO GROWTH

## 2020-10-22 LAB — MAGNESIUM: Magnesium: 1.5 mg/dL — ABNORMAL LOW (ref 1.7–2.4)

## 2020-10-22 LAB — PATHOLOGIST SMEAR REVIEW

## 2020-10-22 MED ORDER — METOPROLOL SUCCINATE ER 100 MG PO TB24
200.0000 mg | ORAL_TABLET | Freq: Every evening | ORAL | Status: DC
  Administered 2020-10-22: 200 mg via ORAL
  Filled 2020-10-22: qty 2

## 2020-10-22 MED ORDER — POTASSIUM CHLORIDE CRYS ER 20 MEQ PO TBCR
40.0000 meq | EXTENDED_RELEASE_TABLET | Freq: Once | ORAL | Status: AC
  Administered 2020-10-22: 40 meq via ORAL
  Filled 2020-10-22: qty 2

## 2020-10-22 MED ORDER — MAGNESIUM SULFATE 4 GM/100ML IV SOLN
4.0000 g | Freq: Once | INTRAVENOUS | Status: AC
  Administered 2020-10-22: 4 g via INTRAVENOUS
  Filled 2020-10-22: qty 100

## 2020-10-22 MED ORDER — WARFARIN SODIUM 7.5 MG PO TABS
7.5000 mg | ORAL_TABLET | Freq: Once | ORAL | Status: AC
  Administered 2020-10-22: 7.5 mg via ORAL
  Filled 2020-10-22: qty 1

## 2020-10-22 MED ORDER — LISINOPRIL 20 MG PO TABS
20.0000 mg | ORAL_TABLET | Freq: Every day | ORAL | Status: DC
  Administered 2020-10-22 – 2020-10-23 (×2): 20 mg via ORAL
  Filled 2020-10-22 (×2): qty 1

## 2020-10-22 MED ORDER — FUROSEMIDE 40 MG PO TABS
40.0000 mg | ORAL_TABLET | Freq: Two times a day (BID) | ORAL | Status: DC
  Administered 2020-10-22 – 2020-10-23 (×3): 40 mg via ORAL
  Filled 2020-10-22 (×3): qty 1

## 2020-10-22 MED ORDER — POTASSIUM CHLORIDE 10 MEQ/100ML IV SOLN
10.0000 meq | INTRAVENOUS | Status: DC
Start: 2020-10-22 — End: 2020-10-22

## 2020-10-22 MED ORDER — POTASSIUM CHLORIDE CRYS ER 20 MEQ PO TBCR
20.0000 meq | EXTENDED_RELEASE_TABLET | Freq: Two times a day (BID) | ORAL | Status: DC
  Administered 2020-10-22 – 2020-10-23 (×3): 20 meq via ORAL
  Filled 2020-10-22 (×3): qty 1

## 2020-10-22 NOTE — Progress Notes (Addendum)
Heart Failure Nurse Navigator Progress Note  Attempted to assess and interview pt for HV TOC readiness. Pt currently in bathroom with family member at bedside. Will attempt interview later.   EDIT: Pt from [place], [place]. Has follow up providers including cardiology in [place], [place]. Plans to follow up with established healthcare providers.   Ozella Rocks, MSN, RN Heart Failure Nurse Navigator [phone]

## 2020-10-22 NOTE — Progress Notes (Addendum)
HD#2 SUBJECTIVE:  Patient Summary: Adam Lin is a 70 y.o. 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   Overnight Events: Patient states that he did not rest well.  His main concerns are about his continued sinus symptoms.  Denies dysuria and abdominal pain.  OBJECTIVE:  Vital Signs: Vitals:   10/22/20 0010 10/22/20 0338 10/22/20 0830 10/22/20 1144  BP: (!) 144/79 (!) 137/92 134/89 (!) 142/90  Pulse: 82 92 89 85  Resp: 20 20 18 18   Temp: 99.3 F (37.4 C) 98.2 F (36.8 C) 97.7 F (36.5 C) 97.8 F (36.6 C)  TempSrc: Oral Oral Oral Oral  SpO2: 95% 96% 97% 98%  Weight:  130.2 kg    Height:       Supplemental O2: Room Air SpO2: 98 %  Filed Weights   10/20/20 1710 10/21/20 2017 10/22/20 0338  Weight: 122.5 kg 131.2 kg 130.2 kg     Intake/Output Summary (Last 24 hours) at 10/22/2020 1322 Last data filed at 10/22/2020 1154 Gross per 24 hour  Intake 670 ml  Output 700 ml  Net -30 ml   Net IO Since Admission: 4,682.12 mL [10/22/20 1322]  Physical Exam: Constitutional: Elderly man sitting up in bed in no acute distress HENT: NCAT Eyes: no scleral icterus, conjunctiva clear CV: Irregularly irregular, mechanical click Pulm: Clear to auscultation bilaterally, normal pulmonary effort GI: No tenderness, abdomen soft MSK: 1+ pitting edema on legs bilaterally, chronic venous stasis changes in legs Skin: Warm and dry Psych: Normal mood and affect    Patient Lines/Drains/Airways Status     Active Line/Drains/Airways     Name Placement date Placement time Site Days   Peripheral IV 10/20/20 20 G Anterior;Distal;Left;Upper Antecubital 10/20/20  --  Antecubital  2   Peripheral IV 10/20/20 20 G Left;Posterior Hand 10/20/20  1700  Hand  2    Peripheral IV 10/20/20 20 G 1" Anterior;Distal;Right;Upper Arm 10/20/20  --  Arm  2   Wound / Incision (Open or Dehisced) 10/21/20 Non-pressure wound Toe (Comment  which one) Anterior;Right 10/21/20  0752  Toe (Comment  which one)  1            Pertinent Labs: CBC Latest Ref Rng & Units 10/22/2020 10/21/2020 10/20/2020  WBC 4.0 - 10.5 K/uL 5.7 17.3(H) 11.7(H)  Hemoglobin 13.0 - 17.0 g/dL 12.1(L) 13.2 13.8  Hematocrit 39.0 - 52.0 % 36.8(L) 41.5 40.9  Platelets 150 - 400 K/uL 94(L) 149(L) 163    CMP Latest Ref Rng & Units 10/22/2020 10/21/2020 10/20/2020  Glucose 70 - 99 mg/dL 12/20/2020) 213(Y) 865(H)  BUN 8 - 23 mg/dL 13 15 16   Creatinine 0.61 - 1.24 mg/dL 846(N ) 6.29  Sodium 135 - 145 mmol/L 132(L) 136 135  Potassium 3.5 - 5.1 mmol/L 3.3(L) 4.4 3.5  Chloride 98 - 111 mmol/L 102 103 103  CO2 22 - 32 mmol/L 24 24 24   Calcium 8.9 - 10.3 mg/dL 8.2(L) 8.5(L) 8.5(L)  Total Protein 6.5 - 8.1 g/dL 6.6 7.2 7.5  Total Bilirubin 0.3 - 1.2 mg/dL 5.28(U) 1.9(H) 1.5(H)  Alkaline Phos 38 - 126 U/L 51 56 58  AST 15 - 41 U/L 29 31 27   ALT 0 - 44 U/L 20 20 19     Recent Labs    10/21/20 2050 10/22/20 0544 10/22/20  1143  GLUCAP 116* 102* 123*     Pertinent Imaging: No results found.  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  He has been on Unasyn for 2 days.  Patient has been afebrile since yesterday.  Leukocytosis resolved at 5.7 today.  Echo did not show signs concerning for endocarditis.  No growth to date of blood and urine culture.  We will continue Unasyn for day 2 and if patient remains stable will transition to oral Augmentin.  -Discontinue vancomycin and cefepime -On Unasyn, day 2 -Follow-up on blood cultures, no growth thus far -Follow-up on urine culture   #Chronic valvular atrial fibrillation Fibrillation with no RVR. -Continue warfarin, goal INR of 2-3 due to aortic mechanical valve.   #Acute on Chronic HFrEF:  volume overloaded today as a result of IV fluid resuscitation on admission. Goal directed medications also held due to hypotension, which is now resolved. Infection seems to be under control, so we can start to resume chronic medications. -Restarted medication of oral Lasix -Restart losartan and metoprolol   Best Practice: Diet: Cardiac diet IVF: Fluids: none VTE: warfarin Code: Full AB: Unasyn Family Contact: will contact Debbie, patient's wife DISPO: Anticipated discharge to home.  Signature: Rudene Christians, D.O. Internal Medicine Resident, PGY-1 [facility]  Pager: [phone] 1:22 PM, 10/22/2020   Please contact the on call pager after 5 pm and on weekends at [phone].

## 2020-10-22 NOTE — Progress Notes (Addendum)
ANTICOAGULATION CONSULT NOTE  Pharmacy Consult:  Coumadin and history of mechanical AVR Indication: atrial fibrillation  Allergies  Allergen Reactions   Allegra [Fexofenadine] Other (See Comments)   Benadryl [Diphenhydramine] Swelling    Blisters    Patient Measurements: Height: 6\' 4"  (193 cm) Weight: 130.2 kg (287 lb 1.6 oz) IBW/kg (Calculated) : 86.8  Vital Signs: Temp: 98.2 F (36.8 C) (09/13 0338) Temp Source: Oral (09/13 0338) BP: 137/92 (09/13 0338) Pulse Rate: 92 (09/13 0338)  Labs: Recent Labs    10/20/20 1617 10/20/20 1818 10/21/20 0047 10/22/20 0356  HGB 13.8  --  13.2 12.1*  HCT 40.9  --  41.5 36.8*  PLT 163  --  149* 94*  APTT 40*  --   --   --   LABPROT 28.2*  --  30.9* 27.4*  INR 2.6*  --  3.0* 2.6*  CREATININE 1.09  --  1.29* 0.96  TROPONINIHS 12 14  --   --      Estimated Creatinine Clearance: 102.5 mL/min (by C-G formula based on SCr of 0.96 mg/dL).  Assessment: 70 yom being treated for cellulitis, has a history of Afib and mechanical AVR on Coumadin 7.5mg  PO daily PTA.  Noted patient has a history of GIB.  Pharmacy consulted to continue Coumadin.  INR therapeutic and trended down to 2.6.  Platelet count trended down to 94 (unsure of baseline, was at 163 on admit); no bleeding reported.  Goal of Therapy:  INR goal 2.5-3.5 per clinic note in 2021 Monitor platelets by anticoagulation protocol: Yes   Plan:  Coumadin 7.5mg  PO today Daily PT / INR Monitor platelets closely  Zelma Snead D. 2022, PharmD, BCPS, BCCCP 10/22/2020, 8:20 AM

## 2020-10-22 NOTE — Discharge Summary (Addendum)
Name: Adam Lin MRN: [id number] DOB: 06-Feb-1951 70 y.o. PCP: System, Provider Not In  Date of Admission: 10/20/2020  4:08 PM Date of Discharge: 10/23/2020 Attending Physician: Tyson Alias, *  Discharge Diagnosis: 1.  Fever with altered mental status, likely gram negative bacteremia 2.  Chronic HFrEF 3.  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  Valvular atrial fibrillation 5.  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   amoxicillin-clavulanate 875-125 MG tablet Commonly known as: AUGMENTIN Take 1 tablet by mouth every 12 (twelve) hours for 4 days.   Docusate Sodium 100 MG capsule Take 100 mg by mouth 2 (two) times daily.   furosemide 40 MG tablet Commonly known as: LASIX Take 40 mg by mouth 2 (two) times daily.   levothyroxine 200 MCG tablet Commonly known as: SYNTHROID Take 200 mcg by mouth daily.   lisinopril 20 MG tablet Commonly known as: ZESTRIL Take 20 mg by mouth daily.   metoprolol 200 MG 24 hr tablet Commonly known as: TOPROL-XL Take 200 mg by mouth every evening.   Multivitamin Adult Chew Chew 1 tablet by mouth every evening.   tamsulosin 0.4 MG Caps capsule Commonly known as: FLOMAX Take 0.4 mg by mouth daily.   Vitamin B Complex Tabs Take 1 tablet by mouth daily.   Vitamin C 500 MG Chew Chew 1 tablet by mouth every evening.   Vitamin D3 25 MCG (1000 UT) Chew Chew 1 tablet by mouth every evening.   warfarin 5 MG tablet Commonly known as: COUMADIN Take 7.5 mg by mouth every evening.        Disposition and  follow-up:   Adam Lin was discharged from [facility] in Stable condition.  At the hospital follow up visit please address:  1.  Please follow-up on patient's INR with taking Augmentin.    2.  Labs / imaging needed at time of follow-up: CBC, BMP  3.  Pending labs/ test needing follow-up: None  Follow-up Appointments: Asked patient to schedule appointment with his PCP in next 1-2 weeks following discharge.  Hospital Course by problem list: 1.  Fever with rigors and altered mental status, likely gram negative bacteremia Patient with past medical history of Marfan's syndrome status post aortic valve replacement, Non-insulin dependent type 2 diabetes, cirrhosis, history of stage I colon cancer of ascending colon status postresection presented after an episode of fever and chills.  Fever taken at home was between 102-103 Fahrenheit.  Patient was disoriented and did not know where he was, EMS was called by family due to concern of patient having stroke.  Antibiotics initiated in the ED including cefepime and vancomycin.  He stated that he recently had sinus symptoms for the past few days.  Echo did not show signs of endocarditis.  Antibiotics were de-escalated to Unasyn to cover for gram-negative bacteremia and possible sinus infection.  Patient remained afebrile and leukocytosis improved from 17.3-5.7.   No signs concerning for endocarditis on echo. Blood culture and urine culture, no growth after 3 days.  However, blood cultures were drawn after patient had received IV antibiotics so it is possible patient had gram negative bacteria prior. Procalcitonin was very elevated at 12. His clinical symptoms and course seems most consistent with a transient GNR bacteremia, source could have been odontogenic or from sinus infection. Will transition patient to Augmentin 875mg  BID until 10/27/20 to finish empiric treatment.  Discharge Exam:   BP (!) 135/96 (BP Location: Left Arm)    Pulse 90   Temp (!) 97.5 F (36.4 C) (Oral)   Resp 20   Ht 6\' 4"  (1.93 m)   Wt 130 kg   SpO2 98%   BMI 34.89 kg/m  Discharge exam:  Constitutional: Elderly man sitting up in bed in no acute distress HENT: NCAT Eyes: no scleral icterus, conjunctiva clear CV: Irregularly irregular, mechanical click Pulm: Clear to auscultation bilaterally, normal pulmonary effort GI: No tenderness, abdomen soft MSK: 1+ pitting edema on legs bilaterally, chronic venous stasis changes in legs Skin: Warm and dry Psych: Normal mood and affect   Pertinent Labs, Studies, and Procedures:  CBC Latest Ref Rng & Units 10/23/2020 10/22/2020 10/21/2020  WBC 4.0 - 10.5 K/uL 6.3 5.7 17.3(H)  Hemoglobin 13.0 - 17.0 g/dL 11.8(L) 12.1(L) 13.2  Hematocrit 39.0 - 52.0 % 35.4(L) 36.8(L) 41.5  Platelets 150 - 400 K/uL 97(L) 94(L) 149(L)    BMP Latest Ref Rng & Units 10/23/2020 10/22/2020 10/21/2020  Glucose 70 - 99 mg/dL 10/24/2020) 12/21/2020) 196(Q)  BUN 8 - 23 mg/dL 12 13 15   Creatinine 0.61 - 1.24 mg/dL 229(N 989(Q )  Sodium 135 - 145 mmol/L 131(L) 132(L) 136  Potassium 3.5 - 5.1 mmol/L 3.7 3.3(L) 4.4  Chloride 98 - 111 mmol/L 97(L) 102 103  CO2 22 - 32 mmol/L 27 24 24   Calcium 8.9 - 10.3 mg/dL 8.3(L) 8.2(L) 8.5(L)   Echo 10/21/2020 FINDINGS   Left Ventricle: Left ventricular ejection fraction, by estimation, is 45  to 50%. The left ventricle has mildly decreased function. The left  ventricle demonstrates global hypokinesis. Definity contrast agent was  given IV to delineate the left ventricular   endocardial borders. The left ventricular internal cavity size was mildly  dilated. There is moderate left ventricular hypertrophy. Left ventricular  diastolic function could not be evaluated due to atrial fibrillation. Left  ventricular diastolic function   could not be evaluated.   Right Ventricle: The right ventricular size is mildly enlarged. No  increase in right ventricular wall thickness. Right ventricular systolic   function is mildly reduced. There is mildly elevated pulmonary artery  systolic pressure. The tricuspid regurgitant   velocity is 2.42 m/s, and with an assumed right atrial pressure of 15  mmHg, the estimated right ventricular systolic pressure is 38.4 mmHg.   Left Atrium: Left atrial size was severely dilated.   Right Atrium: Right atrial size was moderately dilated.   Pericardium: There is no evidence of pericardial effusion.   Mitral Valve: The mitral valve is abnormal. There is mild thickening of  the mitral valve leaflet(s). Mild to moderate mitral valve regurgitation.   Tricuspid Valve: The tricuspid valve is grossly normal. Tricuspid valve  regurgitation is mild.   Aortic Valve: The aortic valve has been repaired/replaced. Aortic valve  regurgitation is trivial. Aortic valve mean gradient measures 9.3 mmHg.  Aortic valve peak gradient measures 14.5 mmHg. Aortic valve area, by VTI  measures 3.91 cm. There is a [employer] bileaflet valve present in the aortic position. Procedure Date: 1999  ([place]). Echo findings  are consistent with normal structure and function of  the aortic valve prosthesis.   Pulmonic Valve: The pulmonic valve was normal in structure. Pulmonic valve  regurgitation is not visualized.   Aorta: Aortic dilatation noted. There is mild dilatation of the aortic  root, measuring 40 mm.   Venous: The inferior vena cava is dilated in size with less than 50%  respiratory variability, suggesting right atrial pressure of 15 mmHg.   IAS/Shunts: No atrial level shunt detected by color flow Doppler.    Discharge Instructions: Discharge Instructions     Diet - low sodium heart healthy   Complete by: As directed    Discharge instructions   Complete by: As directed    Mr. Maziah, [place] were here due to your fever and confusion.  We think that you most likely had bacteria in your blood and you received 2 days of IV antibiotics for this.  We have switched your  antibiotics to Augmentin, taken twice daily.  Please continue this until Sunday.  We are glad to see your improvement.  Thank you for allowing [place] to take part in your care.  Please follow back up with your INR clinic on Friday.  Augmentin is a antibiotic that can interfere with Coumadin.  Also follow-up with your primary care physician following your hospitalization.   Increase activity slowly   Complete by: As directed    No wound care   Complete by: As directed        Signed: Rudene Christians, DO 10/23/2020, 11:32 AM   Pager: [phone]

## 2020-10-23 LAB — CBC
HCT: 35.4 % — ABNORMAL LOW (ref 39.0–52.0)
Hemoglobin: 11.8 g/dL — ABNORMAL LOW (ref 13.0–17.0)
MCH: 32 pg (ref 26.0–34.0)
MCHC: 33.3 g/dL (ref 30.0–36.0)
MCV: 95.9 fL (ref 80.0–100.0)
Platelets: 97 10*3/uL — ABNORMAL LOW (ref 150–400)
RBC: 3.69 MIL/uL — ABNORMAL LOW (ref 4.22–5.81)
RDW: 13.2 % (ref 11.5–15.5)
WBC: 6.3 10*3/uL (ref 4.0–10.5)
nRBC: 0 % (ref 0.0–0.2)

## 2020-10-23 LAB — BASIC METABOLIC PANEL
Anion gap: 7 (ref 5–15)
BUN: 12 mg/dL (ref 8–23)
CO2: 27 mmol/L (ref 22–32)
Calcium: 8.3 mg/dL — ABNORMAL LOW (ref 8.9–10.3)
Chloride: 97 mmol/L — ABNORMAL LOW (ref 98–111)
Creatinine, Ser: 1.02 mg/dL (ref 0.61–1.24)
GFR, Estimated: >60 mL/min (ref >60)
Glucose, Bld: 104 mg/dL — ABNORMAL HIGH (ref 70–99)
Potassium: 3.7 mmol/L (ref 3.5–5.1)
Sodium: 131 mmol/L — ABNORMAL LOW (ref 135–145)

## 2020-10-23 LAB — GLUCOSE, CAPILLARY
Glucose-Capillary: 98 mg/dL (ref 70–99)
Glucose-Capillary: 89 mg/dL (ref 70–99)

## 2020-10-23 LAB — MAGNESIUM: Magnesium: 2.1 mg/dL (ref 1.7–2.4)

## 2020-10-23 LAB — PROTIME-INR
INR: 2.4 — ABNORMAL HIGH (ref 0.8–1.2)
Prothrombin Time: 25.7 seconds — ABNORMAL HIGH (ref 11.4–15.2)

## 2020-10-23 MED ORDER — AMOXICILLIN-POT CLAVULANATE 875-125 MG PO TABS: 1.0000 | ORAL_TABLET | Freq: Two times a day (BID) | ORAL | 0 refills | Status: DC

## 2020-10-23 MED ORDER — AMOXICILLIN-POT CLAVULANATE 875-125 MG PO TABS: 2.0000 | ORAL_TABLET | Freq: Two times a day (BID) | ORAL | Status: DC

## 2020-10-23 MED ORDER — ACETAMINOPHEN 325 MG PO TABS: 650.0000 mg | ORAL_TABLET | Freq: Four times a day (QID) | ORAL | 0 refills | Status: AC | PRN

## 2020-10-23 MED ORDER — AMOXICILLIN-POT CLAVULANATE 875-125 MG PO TABS
1.0000 | ORAL_TABLET | Freq: Two times a day (BID) | ORAL | Status: DC
  Administered 2020-10-23: 1 via ORAL
  Filled 2020-10-23: qty 1

## 2020-10-23 MED ORDER — AMOXICILLIN-POT CLAVULANATE 875-125 MG PO TABS: 1.0000 | ORAL_TABLET | Freq: Two times a day (BID) | ORAL | 0 refills | Status: AC

## 2020-10-23 MED ORDER — WARFARIN SODIUM 3 MG PO TABS: 9.0000 mg | ORAL_TABLET | Freq: Once | ORAL | Status: DC

## 2020-10-23 NOTE — Care Management Important Message (Signed)
Important Message  Patient Details  Name: ADIEN KIMMEL MRN: [id number] Date of Birth: [birth year]   Medicare Important Message Given:  Yes     Shaun Zuccaro Stefan Church 10/23/2020, 3:34 PM

## 2020-10-23 NOTE — Progress Notes (Signed)
ANTICOAGULATION CONSULT NOTE  Pharmacy Consult:  Coumadin and history of mechanical AVR Indication: atrial fibrillation  Allergies  Allergen Reactions   Allegra [Fexofenadine] Other (See Comments)   Benadryl [Diphenhydramine] Swelling    Blisters    Patient Measurements: Height: 6\' 4"  (193 cm) Weight: 130 kg (286 lb 9.6 oz) IBW/kg (Calculated) : 86.8  Vital Signs: Temp: 97 F (36.1 C) (09/14 0447) Temp Source: Oral (09/14 0447) BP: 129/83 (09/14 0447) Pulse Rate: 85 (09/14 0447)  Labs: Recent Labs    10/20/20 1617 10/20/20 1818 10/21/20 0047 10/22/20 0356 10/23/20 0143  HGB 13.8  --  13.2 12.1* 11.8*  HCT 40.9  --  41.5 36.8* 35.4*  PLT 163  --  149* 94* 97*  APTT 40*  --   --   --   --   LABPROT 28.2*  --  30.9* 27.4* 25.7*  INR 2.6*  --  3.0* 2.6* 2.4*  CREATININE 1.09  --  1.29* 0.96 1.02  TROPONINIHS 12 14  --   --   --      Estimated Creatinine Clearance: 96.4 mL/min (by C-G formula based on SCr of 1.02 mg/dL).  Assessment: 70 yom being treated for cellulitis, has a history of Afib and mechanical AVR on Coumadin 7.5mg  PO daily PTA.  Noted patient has a history of GIB.  Pharmacy consulted to continue Coumadin.  INR slightly sub-therapeutic at 2.4.  Platelet count low at 97K (unsure of baseline, was at 163 on admit); no bleeding reported.  Goal of Therapy:  INR goal 2.5-3.5 per clinic note in 2021, IMTS aware Monitor platelets by anticoagulation protocol: Yes   Plan:  Coumadin 9mg  PO today Daily PT / INR Monitor platelets closely  Mieka Leaton D. 2022, PharmD, BCPS, BCCCP 10/23/2020, 7:26 AM

## 2020-10-25 LAB — CULTURE, BLOOD (SINGLE): Culture: NO GROWTH

## 2020-10-26 LAB — CULTURE, BLOOD (ROUTINE X 2)
Culture: NO GROWTH
Special Requests: ADEQUATE

## 2021-01-30 ENCOUNTER — Other Ambulatory Visit: Payer: Self-pay

## 2022-03-29 ENCOUNTER — Encounter (INDEPENDENT_AMBULATORY_CARE_PROVIDER_SITE_OTHER): Payer: Self-pay

## 2023-03-02 IMAGING — DX DG FOOT 2V*R*
2 series · 2 of 2 positions shown · non-contrast
Comparison: None.

CLINICAL DATA: Fever, swelling, erythema

EXAM:
RIGHT FOOT - 2 VIEW

[foot ap]
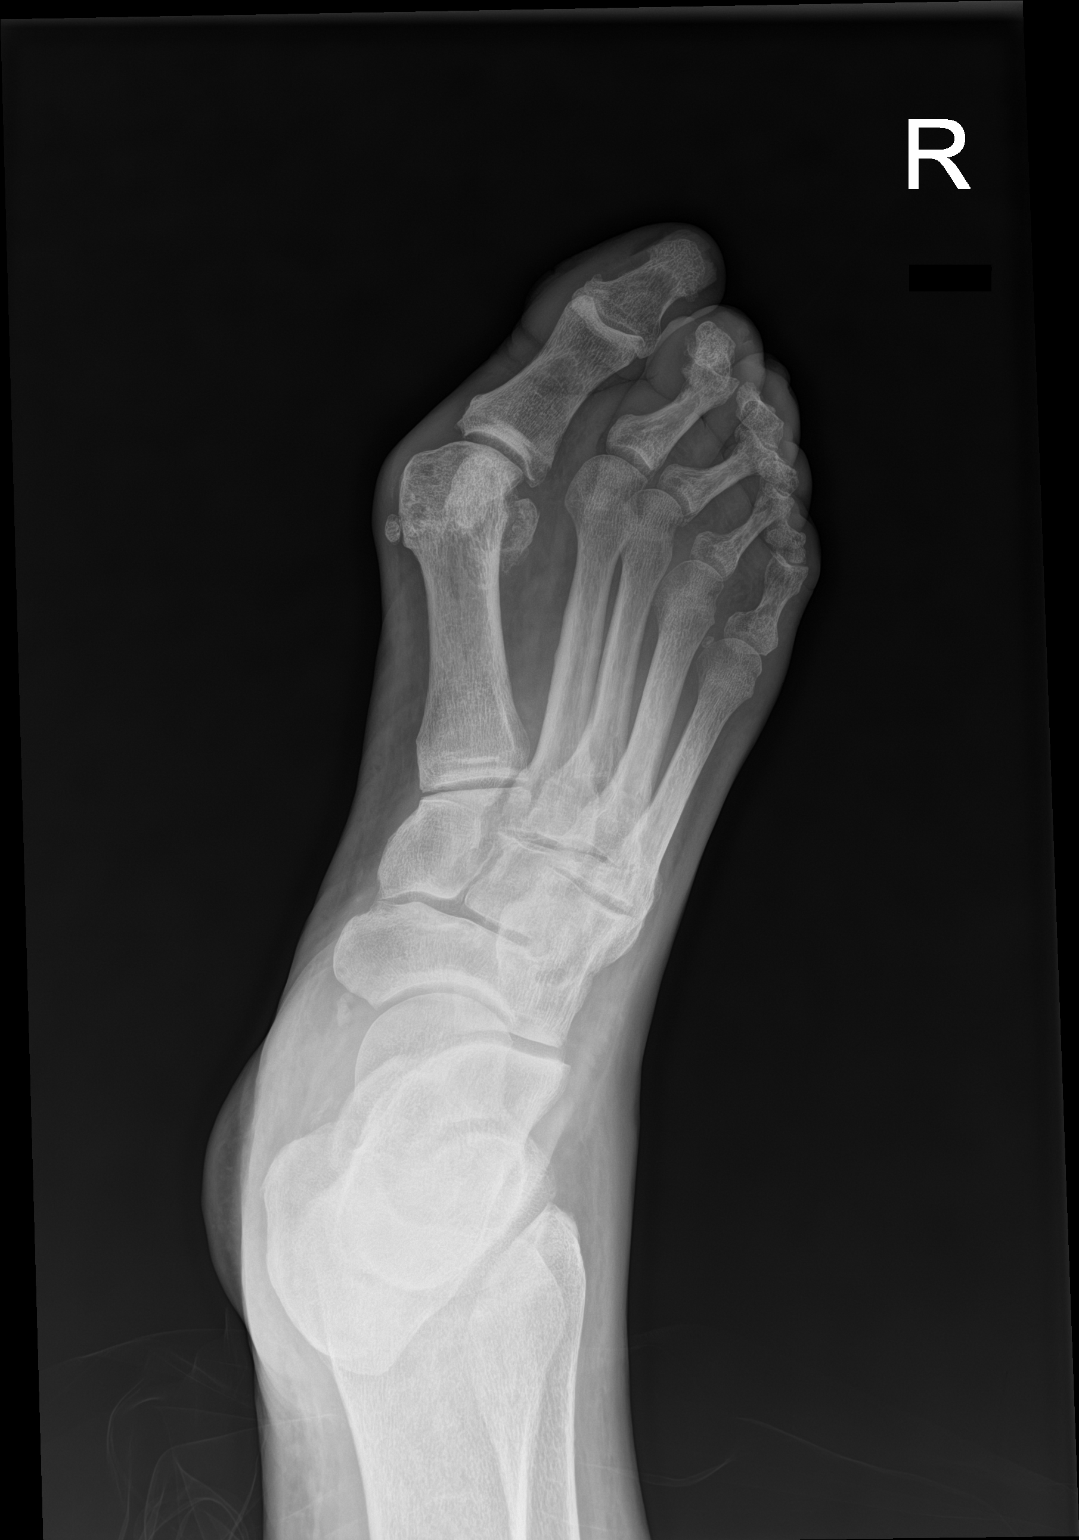

[foot lat]
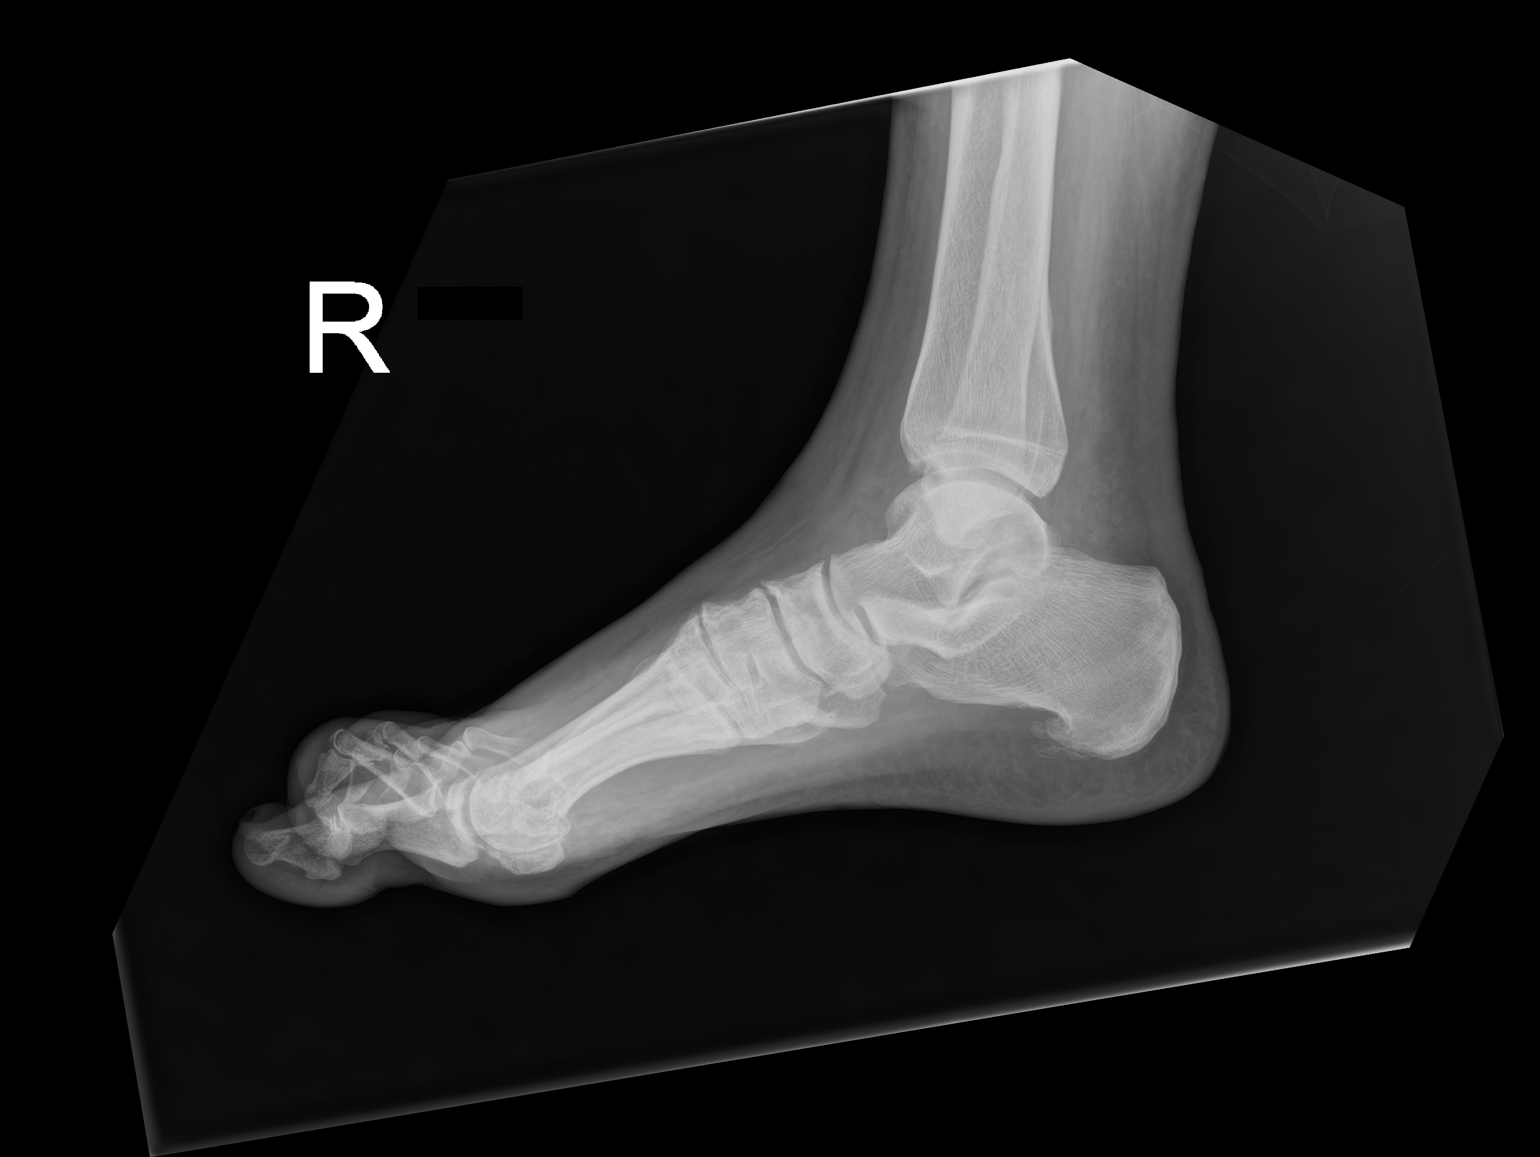

[2 of 2 positions shown; findings below may reference images not displayed]

FINDINGS: Frontal and lateral views of the right foot are obtained. There is
moderate hallux valgus and hammertoe deformities. Diffuse
osteoarthritis greatest in the midfoot and first metatarsophalangeal
joint. No acute or destructive bony lesions. Small inferior cannulae
spur. Mild diffuse soft tissue edema.
IMPRESSION: 1. No acute or destructive bony lesions.
2. Diffuse soft tissue edema.
3. Multifocal degenerative changes.

## 2023-03-02 IMAGING — DX DG CHEST 1V PORT
1 series · 1 of 1 positions shown · non-contrast
Comparison: None.

CLINICAL DATA: Questionable sepsis.

EXAM:
PORTABLE CHEST 1 VIEW

[chest]
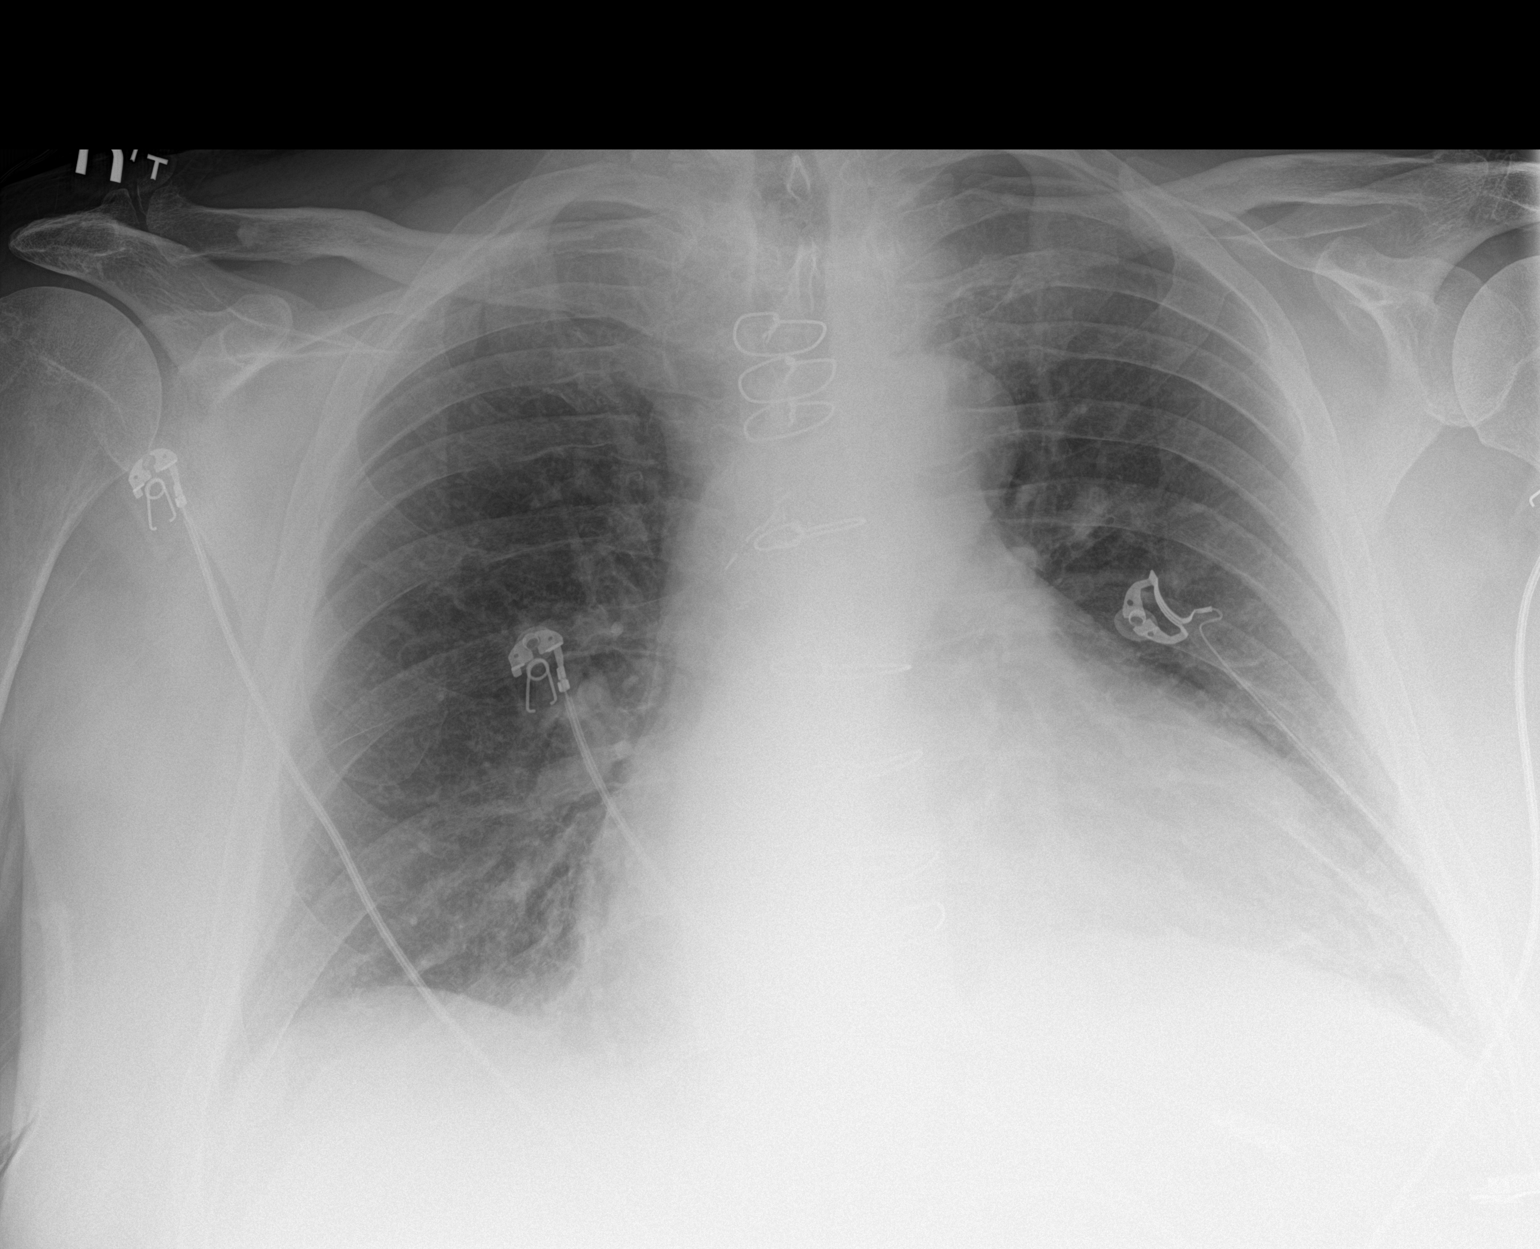

[1 of 1 positions shown; findings below may reference images not displayed]

FINDINGS: Tortuosity of the aorta.  Postsurgical changes from CABG.

Borderline enlarged cardiac silhouette.

There is no evidence of focal airspace consolidation, pleural
effusion or pneumothorax.

Osseous structures are without acute abnormality. Soft tissues are
grossly normal.
IMPRESSION: 1. No evidence of acute cardiopulmonary disease.
2. Borderline enlarged cardiac silhouette.

## 2023-06-29 ENCOUNTER — Encounter (INDEPENDENT_AMBULATORY_CARE_PROVIDER_SITE_OTHER): Payer: Self-pay
